# Patient Record
Sex: Male | Born: 1935 | Race: White | Hispanic: No | Marital: Married | Smoking: Former smoker
Health system: Southern US, Community
[De-identification: ages and names within clinical notes are randomized; demographics above are authoritative.]

## PROBLEM LIST (undated history)

## (undated) DIAGNOSIS — E785 Hyperlipidemia, unspecified: Secondary | ICD-10-CM

## (undated) DIAGNOSIS — I219 Acute myocardial infarction, unspecified: Secondary | ICD-10-CM

## (undated) DIAGNOSIS — E119 Type 2 diabetes mellitus without complications: Secondary | ICD-10-CM

## (undated) DIAGNOSIS — N289 Disorder of kidney and ureter, unspecified: Secondary | ICD-10-CM

## (undated) DIAGNOSIS — Z5189 Encounter for other specified aftercare: Secondary | ICD-10-CM

## (undated) DIAGNOSIS — I469 Cardiac arrest, cause unspecified: Secondary | ICD-10-CM

## (undated) DIAGNOSIS — I10 Essential (primary) hypertension: Secondary | ICD-10-CM

## (undated) DIAGNOSIS — I509 Heart failure, unspecified: Secondary | ICD-10-CM

## (undated) DIAGNOSIS — K219 Gastro-esophageal reflux disease without esophagitis: Secondary | ICD-10-CM

## (undated) DIAGNOSIS — I2510 Atherosclerotic heart disease of native coronary artery without angina pectoris: Secondary | ICD-10-CM

## (undated) DIAGNOSIS — E1121 Type 2 diabetes mellitus with diabetic nephropathy: Secondary | ICD-10-CM

## (undated) HISTORY — DX: Acute myocardial infarction, unspecified: I21.9

## (undated) HISTORY — DX: Hyperlipidemia, unspecified: E78.5

## (undated) HISTORY — DX: Heart failure, unspecified: I50.9

## (undated) HISTORY — DX: Type 2 diabetes mellitus with diabetic nephropathy: E11.21

## (undated) HISTORY — DX: Encounter for other specified aftercare: Z51.89

---

## 2013-01-29 ENCOUNTER — Emergency Department (HOSPITAL_COMMUNITY)
Admission: EM | Admit: 2013-01-29 | Discharge: 2013-01-29 | Disposition: A | Discharge status: Home / Self Care | Payer: Medicare Other | Attending: Emergency Medicine | Admitting: Emergency Medicine

## 2013-01-29 ENCOUNTER — Emergency Department (HOSPITAL_COMMUNITY): Payer: Medicare Other

## 2013-01-29 ENCOUNTER — Encounter (HOSPITAL_COMMUNITY): Payer: Self-pay | Admitting: Emergency Medicine

## 2013-01-29 DIAGNOSIS — K219 Gastro-esophageal reflux disease without esophagitis: Secondary | ICD-10-CM | POA: Insufficient documentation

## 2013-01-29 DIAGNOSIS — Z87448 Personal history of other diseases of urinary system: Secondary | ICD-10-CM | POA: Insufficient documentation

## 2013-01-29 DIAGNOSIS — S46909A Unspecified injury of unspecified muscle, fascia and tendon at shoulder and upper arm level, unspecified arm, initial encounter: Secondary | ICD-10-CM | POA: Insufficient documentation

## 2013-01-29 DIAGNOSIS — S4980XA Other specified injuries of shoulder and upper arm, unspecified arm, initial encounter: Secondary | ICD-10-CM | POA: Insufficient documentation

## 2013-01-29 DIAGNOSIS — R109 Unspecified abdominal pain: Secondary | ICD-10-CM

## 2013-01-29 DIAGNOSIS — E119 Type 2 diabetes mellitus without complications: Secondary | ICD-10-CM | POA: Insufficient documentation

## 2013-01-29 DIAGNOSIS — N23 Unspecified renal colic: Secondary | ICD-10-CM | POA: Insufficient documentation

## 2013-01-29 DIAGNOSIS — Y939 Activity, unspecified: Secondary | ICD-10-CM | POA: Insufficient documentation

## 2013-01-29 DIAGNOSIS — Y929 Unspecified place or not applicable: Secondary | ICD-10-CM | POA: Insufficient documentation

## 2013-01-29 DIAGNOSIS — Z79899 Other long term (current) drug therapy: Secondary | ICD-10-CM | POA: Insufficient documentation

## 2013-01-29 DIAGNOSIS — R112 Nausea with vomiting, unspecified: Secondary | ICD-10-CM | POA: Insufficient documentation

## 2013-01-29 DIAGNOSIS — S0993XA Unspecified injury of face, initial encounter: Secondary | ICD-10-CM | POA: Insufficient documentation

## 2013-01-29 DIAGNOSIS — R296 Repeated falls: Secondary | ICD-10-CM | POA: Insufficient documentation

## 2013-01-29 DIAGNOSIS — I10 Essential (primary) hypertension: Secondary | ICD-10-CM | POA: Insufficient documentation

## 2013-01-29 DIAGNOSIS — S3981XA Other specified injuries of abdomen, initial encounter: Secondary | ICD-10-CM | POA: Insufficient documentation

## 2013-01-29 DIAGNOSIS — Z7982 Long term (current) use of aspirin: Secondary | ICD-10-CM | POA: Insufficient documentation

## 2013-01-29 HISTORY — DX: Type 2 diabetes mellitus without complications: E11.9

## 2013-01-29 HISTORY — DX: Essential (primary) hypertension: I10

## 2013-01-29 HISTORY — DX: Disorder of kidney and ureter, unspecified: N28.9

## 2013-01-29 HISTORY — DX: Gastro-esophageal reflux disease without esophagitis: K21.9

## 2013-01-29 LAB — CBC WITH DIFFERENTIAL/PLATELET
Basophils Absolute: 0 10*3/uL (ref 0.0–0.1)
Basophils Relative: 0 % (ref 0–1)
Eosinophils Absolute: 0.1 10*3/uL (ref 0.0–0.7)
Eosinophils Relative: 1 % (ref 0–5)
HCT: 43.5 % (ref 39.0–52.0)
Hemoglobin: 14.8 g/dL (ref 13.0–17.0)
Lymphocytes Relative: 10 % — ABNORMAL LOW (ref 12–46)
Lymphs Abs: 1.2 10*3/uL (ref 0.7–4.0)
MCH: 31.8 pg (ref 26.0–34.0)
MCHC: 34 g/dL (ref 30.0–36.0)
MCV: 93.5 fL (ref 78.0–100.0)
Monocytes Absolute: 1.2 10*3/uL — ABNORMAL HIGH (ref 0.1–1.0)
Monocytes Relative: 10 % (ref 3–12)
Neutro Abs: 9.8 10*3/uL — ABNORMAL HIGH (ref 1.7–7.7)
Neutrophils Relative %: 79 % — ABNORMAL HIGH (ref 43–77)
Platelets: 255 10*3/uL (ref 150–400)
RBC: 4.65 MIL/uL (ref 4.22–5.81)
RDW: 13.5 % (ref 11.5–15.5)
WBC: 12.3 10*3/uL — ABNORMAL HIGH (ref 4.0–10.5)

## 2013-01-29 LAB — URINE MICROSCOPIC-ADD ON

## 2013-01-29 LAB — BASIC METABOLIC PANEL
BUN: 22 mg/dL (ref 6–23)
CO2: 23 mEq/L (ref 19–32)
Calcium: 9.8 mg/dL (ref 8.4–10.5)
Chloride: 98 mEq/L (ref 96–112)
Creatinine, Ser: 1.68 mg/dL — ABNORMAL HIGH (ref 0.50–1.35)
GFR calc Af Amer: 44 mL/min — ABNORMAL LOW (ref >90)
GFR calc non Af Amer: 38 mL/min — ABNORMAL LOW (ref >90)
Glucose, Bld: 146 mg/dL — ABNORMAL HIGH (ref 70–99)
Potassium: 3.8 mEq/L (ref 3.5–5.1)
Sodium: 135 mEq/L (ref 135–145)

## 2013-01-29 LAB — URINALYSIS, ROUTINE W REFLEX MICROSCOPIC
Bilirubin Urine: NEGATIVE
Glucose, UA: 100 mg/dL — AB
Ketones, ur: NEGATIVE mg/dL
Leukocytes, UA: NEGATIVE
Nitrite: NEGATIVE
Protein, ur: 100 mg/dL — AB
Specific Gravity, Urine: >1.03 — ABNORMAL HIGH (ref 1.005–1.030)
Urobilinogen, UA: 0.2 mg/dL (ref 0.0–1.0)
pH: 5 (ref 5.0–8.0)

## 2013-01-29 MED ORDER — MORPHINE SULFATE 4 MG/ML IJ SOLN
6.0000 mg | Freq: Once | INTRAMUSCULAR | Status: AC
  Administered 2013-01-29: 6 mg via INTRAMUSCULAR
  Filled 2013-01-29: qty 2

## 2013-01-29 MED ORDER — ONDANSETRON 4 MG PO TBDP
4.0000 mg | ORAL_TABLET | Freq: Once | ORAL | Status: AC
  Administered 2013-01-29: 4 mg via ORAL
  Filled 2013-01-29: qty 1

## 2013-01-29 MED ORDER — OXYCODONE-ACETAMINOPHEN 5-325 MG PO TABS: 1.0000 | ORAL_TABLET | ORAL | Status: DC | PRN

## 2013-01-29 MED ORDER — ONDANSETRON HCL 4 MG PO TABS: 4.0000 mg | ORAL_TABLET | Freq: Four times a day (QID) | ORAL | Status: DC

## 2013-01-29 NOTE — ED Notes (Signed)
Pt reports falling "flat on my back" on ice Friday.  Caught self w/ left arm and now having left lower quad ab. Pain, vomited x1 today. No diarrhea. No fever. Took azo today for previous kidney stone. Urine was dark yesterday, clearer today.

## 2013-01-29 NOTE — ED Notes (Signed)
Pt alert & oriented x4, stable gait. Patient given discharge instructions, paperwork & prescription(s). Patient  instructed to stop at the registration desk to finish any additional paperwork. Patient verbalized understanding. Pt left department w/ no further questions. 

## 2013-02-05 NOTE — ED Provider Notes (Signed)
CSN: [id number]     Arrival date & time 01/29/13  1633 History   First MD Initiated Contact with Patient 01/29/13 1943     Chief Complaint  Patient presents with  . Fall   (Consider location/radiation/quality/duration/timing/severity/associated sxs/prior Treatment) HPI  78 year old male with left flank/left back pain. Onset yesterday. Pain waxes and wanes doesn't completely go away. Nausea and vomiting x1. NBNB . Urine has been dark, otherwise no urinary complaints. No fevers or chills. No diarrhea. Also reporting a fall onto his back on Friday. With pain in L neck and L shoulder. Pain in L flank/abdomen began after this though.   Past Medical History  Diagnosis Date  . Diabetes mellitus without complication   . Hypertension   . Renal disorder   . Acid reflux    History reviewed. No pertinent past surgical history. No family history on file. History  Substance Use Topics  . Smoking status: Never Smoker   . Smokeless tobacco: Not on file  . Alcohol Use: No    Review of Systems  All systems reviewed and negative, other than as noted in HPI.   Allergies  Review of patient's allergies indicates no known allergies.  Home Medications   Current Outpatient Rx  Name  Route  Sig  Dispense  Refill  . aspirin EC 81 MG tablet   Oral   Take 81 mg by mouth daily.         Marland Kitchen. atorvastatin (LIPITOR) 80 MG tablet   Oral   Take 80 mg by mouth at bedtime.          . carvedilol (COREG) 6.25 MG tablet   Oral   Take 6.25 mg by mouth 2 (two) times daily.         . finasteride (PROSCAR) 5 MG tablet   Oral   Take 5 mg by mouth at bedtime.          Marland Kitchen. glimepiride (AMARYL) 4 MG tablet   Oral   Take 4 mg by mouth daily.         Marland Kitchen. losartan-hydrochlorothiazide (HYZAAR) 100-25 MG per tablet   Oral   Take 1 tablet by mouth daily.         . metFORMIN (GLUCOPHAGE) 1000 MG tablet   Oral   Take 1,000 mg by mouth 2 (two) times daily.         . pantoprazole (PROTONIX) 40 MG  tablet   Oral   Take 40 mg by mouth daily.         . Phenazopyridine HCl (AZO TABS PO)   Oral   Take 1 tablet by mouth as needed (for pain).         . ondansetron (ZOFRAN) 4 MG tablet   Oral   Take 1 tablet (4 mg total) by mouth every 6 (six) hours.   12 tablet   0   . oxyCODONE-acetaminophen (PERCOCET/ROXICET) 5-325 MG per tablet   Oral   Take 1-2 tablets by mouth every 4 (four) hours as needed for severe pain.   20 tablet   0    BP 124/69  Pulse 70  Temp(Src) 97.9 F (36.6 C) (Oral)  Resp 20  Ht 5\' 9"  (1.753 m)  Wt 190 lb (86.183 kg)  BMI 28.05 kg/m2  SpO2 92% Physical Exam  Nursing note and vitals reviewed. Constitutional: He appears well-developed and well-nourished. No distress.  HENT:  Head: Normocephalic and atraumatic.  Eyes: Conjunctivae are normal. Right eye exhibits no discharge. Left eye  exhibits no discharge.  Neck: Neck supple.  Cardiovascular: Normal rate, regular rhythm and normal heart sounds.  Exam reveals no gallop and no friction rub.   No murmur heard. Pulmonary/Chest: Effort normal and breath sounds normal. No respiratory distress.  Abdominal: Soft. He exhibits no distension. There is no tenderness.  Musculoskeletal: He exhibits no edema and no tenderness.  Neurological: He is alert.  Skin: Skin is warm and dry.  Psychiatric: He has a normal mood and affect. His behavior is normal. Thought content normal.    ED Course  Procedures (including critical care time) Labs Review Labs Reviewed  URINALYSIS, ROUTINE W REFLEX MICROSCOPIC - Abnormal; Notable for the following:    Color, Urine AMBER (*)    APPearance CLOUDY (*)    Specific Gravity, Urine >1.030 (*)    Glucose, UA 100 (*)    Hgb urine dipstick LARGE (*)    Protein, ur 100 (*)    All other components within normal limits  CBC WITH DIFFERENTIAL - Abnormal; Notable for the following:    WBC 12.3 (*)    Neutrophils Relative % 79 (*)    Neutro Abs 9.8 (*)    Lymphocytes Relative  10 (*)    Monocytes Absolute 1.2 (*)    All other components within normal limits  BASIC METABOLIC PANEL - Abnormal; Notable for the following:    Glucose, Bld 146 (*)    Creatinine, Ser 1.68 (*)    GFR calc non Af Amer 38 (*)    GFR calc Af Amer 44 (*)    All other components within normal limits  URINE MICROSCOPIC-ADD ON   Imaging Review No results found.  Ct Abdomen Pelvis Wo Contrast  01/29/2013   CLINICAL DATA:  Larey Seat onto his back 3 days ago. Left lower quadrant abdominal pain. Prior history of urinary tract calculi.  EXAM: CT ABDOMEN AND PELVIS WITHOUT CONTRAST  TECHNIQUE: Multidetector CT imaging of the abdomen and pelvis was performed following the standard protocol without intravenous contrast.  COMPARISON:  None.  FINDINGS: Several small calculi in the distal left ureter at the UVJ. Calculi within the left side of the urinary bladder, likely in the intramural portion of the ureter. These accounts for mild left hydronephrosis. Nonobstructing very small (2 mm) calculus in a lower pole calix of the left kidney. No right urinary tract calculi. Numerous small cysts involving both kidneys, including a hyperdense cyst arising from the upper pole of the left kidney (Hounsfield units greater than 70), consistent with a proteinaceous or hemorrhagic cyst. Within the limits of the unenhanced technique, no definite solid masses arising from either kidney.  Normal unenhanced appearance of the liver, spleen, and adrenal glands. Approximate 1.6 cm cyst in the proximal body of the pancreas which is otherwise unremarkable. Normal adrenal glands. Gallbladder unremarkable by CT. No biliary ductal dilation. Mild to moderate aortoiliofemoral atherosclerosis without aneurysm. No significant lymphadenopathy.  Very small hiatal hernia. Stomach otherwise normal in appearance. Normal-appearing small bowel. Entire colon tortuous, redundant, and relatively decompressed. Scattered diverticula without evidence of acute  diverticulitis. Normal short appendix in the right upper pelvis. No ascites.  Urinary bladder unremarkable. Mild median lobe prostate gland enlargement. Normal seminal vesicles. Phleboliths in both sides of low pelvis. Bilateral inguinal hernias containing fat, right greater than left.  Bone window images demonstrate lower thoracic spondylosis, degenerative disc disease at L2-3 and L5-S1, facet degenerative changes L4-5 and L5-S1, ankylosis involving both sacroiliac joints, and severe degenerative changes in both hips with mild protrusio  deformities. Vague approximate 6 mm nodule in the visualized right middle lobe. Scarring in the right middle lobe and lingula. Mild bronchiectasis in the visualized lower lobes. Heart mildly enlarged.  IMPRESSION: 1. Several small calculi in the distal left ureter at the UVJ, including within the intramural portion of the distal left ureter at the UVJ. These account for mild left urinary tract obstruction. 2. Non obstructing approximate 2 mm calculus in a lower pole calix of the left kidney. No right urinary tract calculi. 3. Approximate 1.6 cm cyst in the proximal body of the pancreas, likely representing a benign intraductal mucinous lesion. 4. Very small hiatal hernia. 5. Scattered colonic diverticular without evidence of acute diverticulitis. 6. Mild prostate gland enlargement. 7. Bilateral inguinal hernias containing fat, right larger than left. 8. Vague 6 mm nodule in the visualized right middle lobe. Scarring in the right middle lobe and lingula. Mild bronchiectasis in the visualized lower lobes. 9. Osseous findings as detailed above. A non-emergent CT of the chest, with contrast if possible, may be helpful to exclude other nodules elsewhere.   Electronically Signed   By: Hulan Saas M.D.   On: 01/29/2013 21:29   EKG Interpretation   None       MDM   1. Left flank pain   2. Ureteral colic    77yM with L flank pain. He initially attributed to recent fall, but  symptoms as well as CT consistent with renal colic. Some renal impairment, but baseline unclear. Pain controlled. Afebrile. Plan expectant management/pain control at this time. Urology FU as needed. Return precautions discussed.     Raeford Razor, MD 02/05/13 [phone]

## 2016-03-18 DIAGNOSIS — E782 Mixed hyperlipidemia: Secondary | ICD-10-CM | POA: Diagnosis not present

## 2016-03-18 DIAGNOSIS — I10 Essential (primary) hypertension: Secondary | ICD-10-CM | POA: Diagnosis not present

## 2016-03-18 DIAGNOSIS — Z713 Dietary counseling and surveillance: Secondary | ICD-10-CM | POA: Diagnosis not present

## 2016-03-18 DIAGNOSIS — K219 Gastro-esophageal reflux disease without esophagitis: Secondary | ICD-10-CM | POA: Diagnosis not present

## 2016-03-18 DIAGNOSIS — Z7189 Other specified counseling: Secondary | ICD-10-CM | POA: Diagnosis not present

## 2016-03-18 DIAGNOSIS — N400 Enlarged prostate without lower urinary tract symptoms: Secondary | ICD-10-CM | POA: Diagnosis not present

## 2016-03-18 DIAGNOSIS — N183 Chronic kidney disease, stage 3 (moderate): Secondary | ICD-10-CM | POA: Diagnosis not present

## 2016-03-18 DIAGNOSIS — Z6828 Body mass index (BMI) 28.0-28.9, adult: Secondary | ICD-10-CM | POA: Diagnosis not present

## 2016-03-18 DIAGNOSIS — E119 Type 2 diabetes mellitus without complications: Secondary | ICD-10-CM | POA: Diagnosis not present

## 2016-03-24 DIAGNOSIS — N183 Chronic kidney disease, stage 3 (moderate): Secondary | ICD-10-CM | POA: Diagnosis not present

## 2016-03-24 DIAGNOSIS — R809 Proteinuria, unspecified: Secondary | ICD-10-CM | POA: Diagnosis not present

## 2016-03-24 DIAGNOSIS — I129 Hypertensive chronic kidney disease with stage 1 through stage 4 chronic kidney disease, or unspecified chronic kidney disease: Secondary | ICD-10-CM | POA: Diagnosis not present

## 2016-03-24 DIAGNOSIS — N2581 Secondary hyperparathyroidism of renal origin: Secondary | ICD-10-CM | POA: Diagnosis not present

## 2016-03-24 DIAGNOSIS — E1122 Type 2 diabetes mellitus with diabetic chronic kidney disease: Secondary | ICD-10-CM | POA: Diagnosis not present

## 2016-03-24 DIAGNOSIS — E559 Vitamin D deficiency, unspecified: Secondary | ICD-10-CM | POA: Diagnosis not present

## 2016-05-21 DIAGNOSIS — Z1211 Encounter for screening for malignant neoplasm of colon: Secondary | ICD-10-CM | POA: Diagnosis not present

## 2016-06-16 DIAGNOSIS — Z6828 Body mass index (BMI) 28.0-28.9, adult: Secondary | ICD-10-CM | POA: Diagnosis not present

## 2016-06-16 DIAGNOSIS — Z713 Dietary counseling and surveillance: Secondary | ICD-10-CM | POA: Diagnosis not present

## 2016-06-16 DIAGNOSIS — N183 Chronic kidney disease, stage 3 (moderate): Secondary | ICD-10-CM | POA: Diagnosis not present

## 2016-06-16 DIAGNOSIS — K219 Gastro-esophageal reflux disease without esophagitis: Secondary | ICD-10-CM | POA: Diagnosis not present

## 2016-06-16 DIAGNOSIS — N400 Enlarged prostate without lower urinary tract symptoms: Secondary | ICD-10-CM | POA: Diagnosis not present

## 2016-06-16 DIAGNOSIS — E119 Type 2 diabetes mellitus without complications: Secondary | ICD-10-CM | POA: Diagnosis not present

## 2016-06-16 DIAGNOSIS — I10 Essential (primary) hypertension: Secondary | ICD-10-CM | POA: Diagnosis not present

## 2016-06-16 DIAGNOSIS — Z7189 Other specified counseling: Secondary | ICD-10-CM | POA: Diagnosis not present

## 2016-06-16 DIAGNOSIS — E782 Mixed hyperlipidemia: Secondary | ICD-10-CM | POA: Diagnosis not present

## 2016-09-22 DIAGNOSIS — R809 Proteinuria, unspecified: Secondary | ICD-10-CM | POA: Diagnosis not present

## 2016-09-22 DIAGNOSIS — N183 Chronic kidney disease, stage 3 (moderate): Secondary | ICD-10-CM | POA: Diagnosis not present

## 2016-09-22 DIAGNOSIS — I129 Hypertensive chronic kidney disease with stage 1 through stage 4 chronic kidney disease, or unspecified chronic kidney disease: Secondary | ICD-10-CM | POA: Diagnosis not present

## 2016-09-23 DIAGNOSIS — I10 Essential (primary) hypertension: Secondary | ICD-10-CM | POA: Diagnosis not present

## 2016-09-23 DIAGNOSIS — Z7189 Other specified counseling: Secondary | ICD-10-CM | POA: Diagnosis not present

## 2016-09-23 DIAGNOSIS — E119 Type 2 diabetes mellitus without complications: Secondary | ICD-10-CM | POA: Diagnosis not present

## 2016-09-23 DIAGNOSIS — Z6828 Body mass index (BMI) 28.0-28.9, adult: Secondary | ICD-10-CM | POA: Diagnosis not present

## 2016-09-23 DIAGNOSIS — Z713 Dietary counseling and surveillance: Secondary | ICD-10-CM | POA: Diagnosis not present

## 2016-09-23 DIAGNOSIS — K219 Gastro-esophageal reflux disease without esophagitis: Secondary | ICD-10-CM | POA: Diagnosis not present

## 2016-09-23 DIAGNOSIS — E782 Mixed hyperlipidemia: Secondary | ICD-10-CM | POA: Diagnosis not present

## 2016-09-23 DIAGNOSIS — N400 Enlarged prostate without lower urinary tract symptoms: Secondary | ICD-10-CM | POA: Diagnosis not present

## 2016-09-23 DIAGNOSIS — N183 Chronic kidney disease, stage 3 (moderate): Secondary | ICD-10-CM | POA: Diagnosis not present

## 2016-10-24 DIAGNOSIS — H524 Presbyopia: Secondary | ICD-10-CM | POA: Diagnosis not present

## 2016-10-24 DIAGNOSIS — E119 Type 2 diabetes mellitus without complications: Secondary | ICD-10-CM | POA: Diagnosis not present

## 2016-10-31 DIAGNOSIS — Z23 Encounter for immunization: Secondary | ICD-10-CM | POA: Diagnosis not present

## 2016-12-17 DIAGNOSIS — I10 Essential (primary) hypertension: Secondary | ICD-10-CM | POA: Diagnosis not present

## 2016-12-17 DIAGNOSIS — N183 Chronic kidney disease, stage 3 (moderate): Secondary | ICD-10-CM | POA: Diagnosis not present

## 2016-12-17 DIAGNOSIS — Z7189 Other specified counseling: Secondary | ICD-10-CM | POA: Diagnosis not present

## 2016-12-17 DIAGNOSIS — Z6828 Body mass index (BMI) 28.0-28.9, adult: Secondary | ICD-10-CM | POA: Diagnosis not present

## 2016-12-17 DIAGNOSIS — N400 Enlarged prostate without lower urinary tract symptoms: Secondary | ICD-10-CM | POA: Diagnosis not present

## 2016-12-17 DIAGNOSIS — E119 Type 2 diabetes mellitus without complications: Secondary | ICD-10-CM | POA: Diagnosis not present

## 2016-12-17 DIAGNOSIS — E782 Mixed hyperlipidemia: Secondary | ICD-10-CM | POA: Diagnosis not present

## 2016-12-17 DIAGNOSIS — Z713 Dietary counseling and surveillance: Secondary | ICD-10-CM | POA: Diagnosis not present

## 2016-12-17 DIAGNOSIS — K219 Gastro-esophageal reflux disease without esophagitis: Secondary | ICD-10-CM | POA: Diagnosis not present

## 2016-12-20 DIAGNOSIS — I469 Cardiac arrest, cause unspecified: Secondary | ICD-10-CM | POA: Diagnosis not present

## 2016-12-21 DIAGNOSIS — I255 Ischemic cardiomyopathy: Secondary | ICD-10-CM | POA: Diagnosis not present

## 2016-12-21 DIAGNOSIS — N179 Acute kidney failure, unspecified: Secondary | ICD-10-CM | POA: Diagnosis not present

## 2016-12-21 DIAGNOSIS — I214 Non-ST elevation (NSTEMI) myocardial infarction: Secondary | ICD-10-CM | POA: Diagnosis not present

## 2016-12-21 DIAGNOSIS — I468 Cardiac arrest due to other underlying condition: Secondary | ICD-10-CM | POA: Diagnosis not present

## 2016-12-21 DIAGNOSIS — I4891 Unspecified atrial fibrillation: Secondary | ICD-10-CM | POA: Diagnosis not present

## 2016-12-21 DIAGNOSIS — I700 Atherosclerosis of aorta: Secondary | ICD-10-CM | POA: Diagnosis not present

## 2016-12-21 DIAGNOSIS — Z79899 Other long term (current) drug therapy: Secondary | ICD-10-CM | POA: Diagnosis not present

## 2016-12-21 DIAGNOSIS — I2584 Coronary atherosclerosis due to calcified coronary lesion: Secondary | ICD-10-CM | POA: Diagnosis present

## 2016-12-21 DIAGNOSIS — R601 Generalized edema: Secondary | ICD-10-CM | POA: Diagnosis not present

## 2016-12-21 DIAGNOSIS — E1159 Type 2 diabetes mellitus with other circulatory complications: Secondary | ICD-10-CM | POA: Diagnosis not present

## 2016-12-21 DIAGNOSIS — I959 Hypotension, unspecified: Secondary | ICD-10-CM | POA: Diagnosis present

## 2016-12-21 DIAGNOSIS — J9600 Acute respiratory failure, unspecified whether with hypoxia or hypercapnia: Secondary | ICD-10-CM | POA: Diagnosis not present

## 2016-12-21 DIAGNOSIS — M7981 Nontraumatic hematoma of soft tissue: Secondary | ICD-10-CM | POA: Diagnosis not present

## 2016-12-21 DIAGNOSIS — Y710 Diagnostic and monitoring cardiovascular devices associated with adverse incidents: Secondary | ICD-10-CM | POA: Diagnosis not present

## 2016-12-21 DIAGNOSIS — I130 Hypertensive heart and chronic kidney disease with heart failure and stage 1 through stage 4 chronic kidney disease, or unspecified chronic kidney disease: Secondary | ICD-10-CM | POA: Diagnosis present

## 2016-12-21 DIAGNOSIS — I219 Acute myocardial infarction, unspecified: Secondary | ICD-10-CM | POA: Diagnosis not present

## 2016-12-21 DIAGNOSIS — I5023 Acute on chronic systolic (congestive) heart failure: Secondary | ICD-10-CM | POA: Diagnosis present

## 2016-12-21 DIAGNOSIS — N183 Chronic kidney disease, stage 3 (moderate): Secondary | ICD-10-CM | POA: Diagnosis not present

## 2016-12-21 DIAGNOSIS — Z8674 Personal history of sudden cardiac arrest: Secondary | ICD-10-CM | POA: Diagnosis not present

## 2016-12-21 DIAGNOSIS — R001 Bradycardia, unspecified: Secondary | ICD-10-CM | POA: Diagnosis not present

## 2016-12-21 DIAGNOSIS — J9811 Atelectasis: Secondary | ICD-10-CM | POA: Diagnosis not present

## 2016-12-21 DIAGNOSIS — J9601 Acute respiratory failure with hypoxia: Secondary | ICD-10-CM | POA: Diagnosis present

## 2016-12-21 DIAGNOSIS — Z9911 Dependence on respirator [ventilator] status: Secondary | ICD-10-CM | POA: Diagnosis not present

## 2016-12-21 DIAGNOSIS — I351 Nonrheumatic aortic (valve) insufficiency: Secondary | ICD-10-CM | POA: Diagnosis present

## 2016-12-21 DIAGNOSIS — R9439 Abnormal result of other cardiovascular function study: Secondary | ICD-10-CM | POA: Diagnosis not present

## 2016-12-21 DIAGNOSIS — G9340 Encephalopathy, unspecified: Secondary | ICD-10-CM | POA: Diagnosis not present

## 2016-12-21 DIAGNOSIS — E785 Hyperlipidemia, unspecified: Secondary | ICD-10-CM | POA: Diagnosis present

## 2016-12-21 DIAGNOSIS — I4901 Ventricular fibrillation: Secondary | ICD-10-CM | POA: Diagnosis not present

## 2016-12-21 DIAGNOSIS — K661 Hemoperitoneum: Secondary | ICD-10-CM | POA: Diagnosis not present

## 2016-12-21 DIAGNOSIS — I447 Left bundle-branch block, unspecified: Secondary | ICD-10-CM | POA: Diagnosis present

## 2016-12-21 DIAGNOSIS — E1129 Type 2 diabetes mellitus with other diabetic kidney complication: Secondary | ICD-10-CM | POA: Diagnosis not present

## 2016-12-21 DIAGNOSIS — N200 Calculus of kidney: Secondary | ICD-10-CM | POA: Diagnosis not present

## 2016-12-21 DIAGNOSIS — R079 Chest pain, unspecified: Secondary | ICD-10-CM | POA: Diagnosis present

## 2016-12-21 DIAGNOSIS — R0689 Other abnormalities of breathing: Secondary | ICD-10-CM | POA: Diagnosis not present

## 2016-12-21 DIAGNOSIS — I25110 Atherosclerotic heart disease of native coronary artery with unstable angina pectoris: Secondary | ICD-10-CM | POA: Diagnosis not present

## 2016-12-21 DIAGNOSIS — R0602 Shortness of breath: Secondary | ICD-10-CM | POA: Diagnosis not present

## 2016-12-21 DIAGNOSIS — L7632 Postprocedural hematoma of skin and subcutaneous tissue following other procedure: Secondary | ICD-10-CM | POA: Diagnosis not present

## 2016-12-21 DIAGNOSIS — K4020 Bilateral inguinal hernia, without obstruction or gangrene, not specified as recurrent: Secondary | ICD-10-CM | POA: Diagnosis not present

## 2016-12-21 DIAGNOSIS — J9602 Acute respiratory failure with hypercapnia: Secondary | ICD-10-CM | POA: Diagnosis not present

## 2016-12-21 DIAGNOSIS — I2510 Atherosclerotic heart disease of native coronary artery without angina pectoris: Secondary | ICD-10-CM | POA: Diagnosis not present

## 2016-12-21 DIAGNOSIS — I25119 Atherosclerotic heart disease of native coronary artery with unspecified angina pectoris: Secondary | ICD-10-CM | POA: Diagnosis not present

## 2016-12-21 DIAGNOSIS — I469 Cardiac arrest, cause unspecified: Secondary | ICD-10-CM | POA: Diagnosis not present

## 2016-12-21 DIAGNOSIS — R4182 Altered mental status, unspecified: Secondary | ICD-10-CM | POA: Diagnosis not present

## 2016-12-21 DIAGNOSIS — D62 Acute posthemorrhagic anemia: Secondary | ICD-10-CM | POA: Diagnosis not present

## 2016-12-21 DIAGNOSIS — J9690 Respiratory failure, unspecified, unspecified whether with hypoxia or hypercapnia: Secondary | ICD-10-CM | POA: Diagnosis not present

## 2016-12-21 DIAGNOSIS — I10 Essential (primary) hypertension: Secondary | ICD-10-CM | POA: Diagnosis not present

## 2016-12-21 DIAGNOSIS — E119 Type 2 diabetes mellitus without complications: Secondary | ICD-10-CM | POA: Diagnosis not present

## 2016-12-21 DIAGNOSIS — R579 Shock, unspecified: Secondary | ICD-10-CM | POA: Diagnosis not present

## 2016-12-21 DIAGNOSIS — Z4682 Encounter for fitting and adjustment of non-vascular catheter: Secondary | ICD-10-CM | POA: Diagnosis not present

## 2016-12-21 DIAGNOSIS — Y838 Other surgical procedures as the cause of abnormal reaction of the patient, or of later complication, without mention of misadventure at the time of the procedure: Secondary | ICD-10-CM | POA: Diagnosis not present

## 2016-12-21 DIAGNOSIS — E872 Acidosis: Secondary | ICD-10-CM | POA: Diagnosis not present

## 2016-12-21 DIAGNOSIS — J90 Pleural effusion, not elsewhere classified: Secondary | ICD-10-CM | POA: Diagnosis not present

## 2016-12-21 DIAGNOSIS — I240 Acute coronary thrombosis not resulting in myocardial infarction: Secondary | ICD-10-CM | POA: Diagnosis not present

## 2016-12-21 DIAGNOSIS — R55 Syncope and collapse: Secondary | ICD-10-CM | POA: Diagnosis not present

## 2016-12-21 DIAGNOSIS — Z7982 Long term (current) use of aspirin: Secondary | ICD-10-CM | POA: Diagnosis not present

## 2016-12-21 DIAGNOSIS — Z87891 Personal history of nicotine dependence: Secondary | ICD-10-CM | POA: Diagnosis not present

## 2016-12-21 DIAGNOSIS — E1122 Type 2 diabetes mellitus with diabetic chronic kidney disease: Secondary | ICD-10-CM | POA: Diagnosis present

## 2016-12-21 DIAGNOSIS — R7989 Other specified abnormal findings of blood chemistry: Secondary | ICD-10-CM | POA: Diagnosis not present

## 2016-12-21 DIAGNOSIS — K219 Gastro-esophageal reflux disease without esophagitis: Secondary | ICD-10-CM | POA: Diagnosis present

## 2016-12-21 DIAGNOSIS — Z794 Long term (current) use of insulin: Secondary | ICD-10-CM | POA: Diagnosis not present

## 2016-12-21 DIAGNOSIS — I462 Cardiac arrest due to underlying cardiac condition: Secondary | ICD-10-CM | POA: Diagnosis not present

## 2016-12-21 DIAGNOSIS — I5022 Chronic systolic (congestive) heart failure: Secondary | ICD-10-CM | POA: Diagnosis not present

## 2016-12-21 DIAGNOSIS — R748 Abnormal levels of other serum enzymes: Secondary | ICD-10-CM | POA: Diagnosis not present

## 2016-12-21 DIAGNOSIS — G931 Anoxic brain damage, not elsewhere classified: Secondary | ICD-10-CM | POA: Diagnosis not present

## 2016-12-30 ENCOUNTER — Inpatient Hospital Stay (HOSPITAL_COMMUNITY)
Admission: EM | Admit: 2016-12-30 | Discharge: 2017-01-04 | DRG: 226 | Disposition: A | Discharge status: Home Health | Payer: Medicare Other | Attending: Cardiology | Admitting: Cardiology

## 2016-12-30 ENCOUNTER — Encounter (HOSPITAL_COMMUNITY): Payer: Self-pay | Admitting: Emergency Medicine

## 2016-12-30 ENCOUNTER — Other Ambulatory Visit: Payer: Self-pay

## 2016-12-30 DIAGNOSIS — I214 Non-ST elevation (NSTEMI) myocardial infarction: Principal | ICD-10-CM | POA: Diagnosis present

## 2016-12-30 DIAGNOSIS — E1159 Type 2 diabetes mellitus with other circulatory complications: Secondary | ICD-10-CM

## 2016-12-30 DIAGNOSIS — Z7982 Long term (current) use of aspirin: Secondary | ICD-10-CM

## 2016-12-30 DIAGNOSIS — I469 Cardiac arrest, cause unspecified: Secondary | ICD-10-CM

## 2016-12-30 DIAGNOSIS — I2510 Atherosclerotic heart disease of native coronary artery without angina pectoris: Secondary | ICD-10-CM | POA: Diagnosis not present

## 2016-12-30 DIAGNOSIS — J9811 Atelectasis: Secondary | ICD-10-CM | POA: Diagnosis not present

## 2016-12-30 DIAGNOSIS — I2584 Coronary atherosclerosis due to calcified coronary lesion: Secondary | ICD-10-CM | POA: Diagnosis not present

## 2016-12-30 DIAGNOSIS — Z8674 Personal history of sudden cardiac arrest: Secondary | ICD-10-CM

## 2016-12-30 DIAGNOSIS — E785 Hyperlipidemia, unspecified: Secondary | ICD-10-CM

## 2016-12-30 DIAGNOSIS — I351 Nonrheumatic aortic (valve) insufficiency: Secondary | ICD-10-CM | POA: Diagnosis present

## 2016-12-30 DIAGNOSIS — I219 Acute myocardial infarction, unspecified: Secondary | ICD-10-CM | POA: Diagnosis not present

## 2016-12-30 DIAGNOSIS — I5023 Acute on chronic systolic (congestive) heart failure: Secondary | ICD-10-CM | POA: Diagnosis not present

## 2016-12-30 DIAGNOSIS — I255 Ischemic cardiomyopathy: Secondary | ICD-10-CM | POA: Diagnosis not present

## 2016-12-30 DIAGNOSIS — Z79899 Other long term (current) drug therapy: Secondary | ICD-10-CM

## 2016-12-30 DIAGNOSIS — I5022 Chronic systolic (congestive) heart failure: Secondary | ICD-10-CM | POA: Diagnosis not present

## 2016-12-30 DIAGNOSIS — I959 Hypotension, unspecified: Secondary | ICD-10-CM | POA: Diagnosis not present

## 2016-12-30 DIAGNOSIS — K219 Gastro-esophageal reflux disease without esophagitis: Secondary | ICD-10-CM | POA: Diagnosis present

## 2016-12-30 DIAGNOSIS — E1169 Type 2 diabetes mellitus with other specified complication: Secondary | ICD-10-CM

## 2016-12-30 DIAGNOSIS — I130 Hypertensive heart and chronic kidney disease with heart failure and stage 1 through stage 4 chronic kidney disease, or unspecified chronic kidney disease: Secondary | ICD-10-CM | POA: Diagnosis present

## 2016-12-30 DIAGNOSIS — Z794 Long term (current) use of insulin: Secondary | ICD-10-CM | POA: Diagnosis not present

## 2016-12-30 DIAGNOSIS — R079 Chest pain, unspecified: Secondary | ICD-10-CM | POA: Diagnosis not present

## 2016-12-30 DIAGNOSIS — I252 Old myocardial infarction: Secondary | ICD-10-CM

## 2016-12-30 DIAGNOSIS — I25110 Atherosclerotic heart disease of native coronary artery with unstable angina pectoris: Secondary | ICD-10-CM | POA: Diagnosis present

## 2016-12-30 DIAGNOSIS — Z95818 Presence of other cardiac implants and grafts: Secondary | ICD-10-CM

## 2016-12-30 DIAGNOSIS — M25569 Pain in unspecified knee: Secondary | ICD-10-CM

## 2016-12-30 DIAGNOSIS — N183 Chronic kidney disease, stage 3 (moderate): Secondary | ICD-10-CM | POA: Diagnosis not present

## 2016-12-30 DIAGNOSIS — I447 Left bundle-branch block, unspecified: Secondary | ICD-10-CM | POA: Diagnosis present

## 2016-12-30 DIAGNOSIS — I10 Essential (primary) hypertension: Secondary | ICD-10-CM | POA: Diagnosis not present

## 2016-12-30 DIAGNOSIS — I200 Unstable angina: Secondary | ICD-10-CM | POA: Diagnosis present

## 2016-12-30 DIAGNOSIS — E1122 Type 2 diabetes mellitus with diabetic chronic kidney disease: Secondary | ICD-10-CM | POA: Diagnosis present

## 2016-12-30 DIAGNOSIS — S8002XA Contusion of left knee, initial encounter: Secondary | ICD-10-CM | POA: Diagnosis not present

## 2016-12-30 DIAGNOSIS — I4901 Ventricular fibrillation: Secondary | ICD-10-CM | POA: Diagnosis not present

## 2016-12-30 HISTORY — DX: Cardiac arrest, cause unspecified: I46.9

## 2016-12-30 HISTORY — DX: Atherosclerotic heart disease of native coronary artery without angina pectoris: I25.10

## 2016-12-30 LAB — BASIC METABOLIC PANEL
Anion gap: 8 (ref 5–15)
BUN: 24 mg/dL — ABNORMAL HIGH (ref 6–20)
CHLORIDE: 103 mmol/L (ref 101–111)
CO2: 23 mmol/L (ref 22–32)
Calcium: 9.3 mg/dL (ref 8.9–10.3)
Creatinine, Ser: 1.29 mg/dL — ABNORMAL HIGH (ref 0.61–1.24)
GFR calc Af Amer: 59 mL/min — ABNORMAL LOW (ref >60)
GFR calc non Af Amer: 51 mL/min — ABNORMAL LOW (ref >60)
GLUCOSE: 244 mg/dL — AB (ref 65–99)
POTASSIUM: 4.6 mmol/L (ref 3.5–5.1)
Sodium: 134 mmol/L — ABNORMAL LOW (ref 135–145)

## 2016-12-30 LAB — CBC
HEMATOCRIT: 38.9 % — AB (ref 39.0–52.0)
Hemoglobin: 12.7 g/dL — ABNORMAL LOW (ref 13.0–17.0)
MCH: 31.4 pg (ref 26.0–34.0)
MCHC: 32.6 g/dL (ref 30.0–36.0)
MCV: 96 fL (ref 78.0–100.0)
Platelets: 376 10*3/uL (ref 150–400)
RBC: 4.05 MIL/uL — ABNORMAL LOW (ref 4.22–5.81)
RDW: 16.4 % — ABNORMAL HIGH (ref 11.5–15.5)
WBC: 10 10*3/uL (ref 4.0–10.5)

## 2016-12-30 LAB — I-STAT TROPONIN, ED: Troponin i, poc: 0.04 ng/mL (ref 0.00–0.08)

## 2016-12-30 LAB — TROPONIN I: TROPONIN I: 0.05 ng/mL — AB (ref <0.03)

## 2016-12-30 LAB — GLUCOSE, CAPILLARY: Glucose-Capillary: 185 mg/dL — ABNORMAL HIGH (ref 65–99)

## 2016-12-30 MED ORDER — SODIUM CHLORIDE 0.9 % IV SOLN: 250.0000 mL | INTRAVENOUS | Status: DC | PRN

## 2016-12-30 MED ORDER — INSULIN ASPART 100 UNIT/ML ~~LOC~~ SOLN
0.0000 [IU] | Freq: Three times a day (TID) | SUBCUTANEOUS | Status: DC
  Administered 2016-12-31 (×2): 3 [IU] via SUBCUTANEOUS
  Administered 2016-12-31 – 2017-01-01 (×3): 2 [IU] via SUBCUTANEOUS
  Administered 2017-01-02 (×2): 3 [IU] via SUBCUTANEOUS
  Administered 2017-01-02: 5 [IU] via SUBCUTANEOUS
  Administered 2017-01-03: 2 [IU] via SUBCUTANEOUS
  Administered 2017-01-03 – 2017-01-04 (×3): 3 [IU] via SUBCUTANEOUS
  Administered 2017-01-04: 2 [IU] via SUBCUTANEOUS

## 2016-12-30 MED ORDER — HYDROCHLOROTHIAZIDE 25 MG PO TABS
25.0000 mg | ORAL_TABLET | Freq: Every day | ORAL | Status: DC
  Administered 2017-01-02: 25 mg via ORAL
  Filled 2016-12-30 (×2): qty 1

## 2016-12-30 MED ORDER — ATORVASTATIN CALCIUM 80 MG PO TABS
80.0000 mg | ORAL_TABLET | Freq: Every day | ORAL | Status: DC
  Administered 2016-12-30 – 2017-01-03 (×5): 80 mg via ORAL
  Filled 2016-12-30 (×5): qty 1

## 2016-12-30 MED ORDER — SODIUM CHLORIDE 0.9% FLUSH: 3.0000 mL | INTRAVENOUS | Status: DC | PRN

## 2016-12-30 MED ORDER — ACETAMINOPHEN 325 MG PO TABS
650.0000 mg | ORAL_TABLET | ORAL | Status: DC | PRN
  Administered 2016-12-31 – 2017-01-04 (×3): 650 mg via ORAL
  Filled 2016-12-30 (×3): qty 2

## 2016-12-30 MED ORDER — PANTOPRAZOLE SODIUM 40 MG PO TBEC
40.0000 mg | DELAYED_RELEASE_TABLET | Freq: Every day | ORAL | Status: DC
  Administered 2016-12-31 – 2017-01-04 (×5): 40 mg via ORAL
  Filled 2016-12-30 (×5): qty 1

## 2016-12-30 MED ORDER — CARVEDILOL 6.25 MG PO TABS
6.2500 mg | ORAL_TABLET | Freq: Two times a day (BID) | ORAL | Status: DC
  Administered 2016-12-30 – 2017-01-03 (×6): 6.25 mg via ORAL
  Filled 2016-12-30 (×9): qty 1

## 2016-12-30 MED ORDER — NITROGLYCERIN 0.4 MG SL SUBL: 0.4000 mg | SUBLINGUAL_TABLET | SUBLINGUAL | Status: DC | PRN

## 2016-12-30 MED ORDER — FINASTERIDE 5 MG PO TABS
5.0000 mg | ORAL_TABLET | Freq: Every day | ORAL | Status: DC
  Administered 2016-12-30 – 2017-01-03 (×5): 5 mg via ORAL
  Filled 2016-12-30 (×5): qty 1

## 2016-12-30 MED ORDER — ONDANSETRON HCL 4 MG/2ML IJ SOLN
4.0000 mg | Freq: Four times a day (QID) | INTRAMUSCULAR | Status: DC | PRN
Start: 2016-12-30 — End: 2017-01-04

## 2016-12-30 MED ORDER — HEPARIN (PORCINE) IN NACL 100-0.45 UNIT/ML-% IJ SOLN
1200.0000 [IU]/h | INTRAMUSCULAR | Status: DC
  Administered 2016-12-30: 1000 [IU]/h via INTRAVENOUS
  Administered 2016-12-31: 1200 [IU]/h via INTRAVENOUS
  Filled 2016-12-30 (×2): qty 250

## 2016-12-30 MED ORDER — NITROGLYCERIN IN D5W 200-5 MCG/ML-% IV SOLN
0.0000 ug/min | Freq: Once | INTRAVENOUS | Status: AC
Start: 2016-12-30 — End: 2016-12-30
  Administered 2016-12-30: 5 ug/min via INTRAVENOUS
  Filled 2016-12-30: qty 250

## 2016-12-30 MED ORDER — ASPIRIN EC 81 MG PO TBEC
81.0000 mg | DELAYED_RELEASE_TABLET | Freq: Every day | ORAL | Status: DC
  Administered 2016-12-30 – 2017-01-04 (×6): 81 mg via ORAL
  Filled 2016-12-30 (×6): qty 1

## 2016-12-30 MED ORDER — SODIUM CHLORIDE 0.9% FLUSH
3.0000 mL | Freq: Two times a day (BID) | INTRAVENOUS | Status: DC
  Administered 2016-12-30 – 2017-01-04 (×9): 3 mL via INTRAVENOUS

## 2016-12-30 MED ORDER — LOSARTAN POTASSIUM 50 MG PO TABS
100.0000 mg | ORAL_TABLET | Freq: Every day | ORAL | Status: DC
  Administered 2017-01-02: 100 mg via ORAL
  Filled 2016-12-30 (×2): qty 2

## 2016-12-30 MED ORDER — HEPARIN BOLUS VIA INFUSION
4000.0000 [IU] | Freq: Once | INTRAVENOUS | Status: AC
  Administered 2016-12-30: 4000 [IU] via INTRAVENOUS
  Filled 2016-12-30: qty 4000

## 2016-12-30 MED ORDER — LOSARTAN POTASSIUM-HCTZ 100-25 MG PO TABS: 1.0000 | ORAL_TABLET | Freq: Every day | ORAL | Status: DC

## 2016-12-30 MED ORDER — GLIMEPIRIDE 4 MG PO TABS
4.0000 mg | ORAL_TABLET | Freq: Every day | ORAL | Status: DC
  Administered 2016-12-31 – 2017-01-04 (×5): 4 mg via ORAL
  Filled 2016-12-30 (×5): qty 1

## 2016-12-30 NOTE — ED Notes (Signed)
Patient denies pain and is resting comfortably.  

## 2016-12-30 NOTE — ED Triage Notes (Signed)
Pt to ER sent from facility in [place] after v.fib cardiac arrest and MI. This happened Sunday night 11/18, over a week ago. Due to out of pocket cost, patient drove private vehicle here, 5.5 hour drive per patient with life vest on. Pt a/o x4. VSS. Patient appears pale.

## 2016-12-30 NOTE — ED Notes (Signed)
Per Dr Karenann Cai ok to remove life vest. Vest given to family.

## 2016-12-30 NOTE — Progress Notes (Signed)
CRITICAL VALUE ALERT  Critical Value:  Troponin 0.05  Date & Time Notied:  12/30/2016 @ 2344  Provider Notified: Provider not notified. Aware.   Orders Received/Actions taken: No new orders.

## 2016-12-30 NOTE — H&P (Signed)
Cardiology Consultation:   Patient ID: Omar Williams; [id number]; 1935/06/14   Admit date: 12/30/2016 Date of Consult: 12/30/2016  Primary Care Provider: Calla Kicks, MD Primary Cardiologist: No primary care provider on file. Dr. Purvis Sheffield Primary Electrophysiologist:     Patient Profile:   Omar Williams is a 81 y.o. male with a hx of HTN, DM who is being seen today for the evaluation of CAD at the request of Dr. Rubin Payor.  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  He was on vacation in [place] and suffered a cardiac arrest.  He required CPR and was intubated for a few days.  He was taken to [facility].  Ultimately, he underwent heart catheterization which showed severe multivessel coronary artery disease.  He recovered in the hospital.  CABG was recommended but the patient wanted to be close to home when he had his surgery.  He decided to come back to [place].  Apparently, the doctor in [place] spoke with Dr. Laneta Simmers here regarding transfer of the patient.  The patient was told that he could go to admitting here and there was a room ready.  He showed up at admitting not feeling well and no one had any knowledge of his transfer.  In addition, the patient came with a LifeVest in a personal vehicle.  The family states that they were told it would be over $10,000 to take him in an ambulance.  He has found the LifeVest uncomfortable.  The patient reports a 2 out of 10 chest soreness.  It is worse when he coughs.  He also has pain with swallowing.  He does not recall any symptoms prior to his cardiac arrest.  Overall prior to this episode, he has been quite healthy.  He reports no allergies.  Past Medical History:  Diagnosis Date  . Acid reflux   . Diabetes mellitus without complication (HCC)   . Hypertension   . Renal disorder     History reviewed. No pertinent surgical history.      Inpatient Medications: Scheduled Meds:  Continuous  Infusions:  PRN Meds:   Allergies:   No Known Allergies  Social History: The patient is married and is a Optician, dispensing. Social History   Socioeconomic History  . Marital status: Married    Spouse name: Not on file  . Number of children: Not on file  . Years of education: Not on file  . Highest education level: Not on file  Social Needs  . Financial resource strain: Not on file  . Food insecurity - worry: Not on file  . Food insecurity - inability: Not on file  . Transportation needs - medical: Not on file  . Transportation needs - non-medical: Not on file  Occupational History  . Not on file  Tobacco Use  . Smoking status: Never Smoker  . Smokeless tobacco: Never Used  Substance and Sexual Activity  . Alcohol use: No  . Drug use: No  . Sexual activity: No  Other Topics Concern  . Not on file  Social History Narrative  . Not on file    Family History:   History reviewed. No pertinent family history.  Hypertension  ROS:  Please see the history of present illness.  ROS  Chest soreness , right sided bruising post cath .  all other ROS reviewed and negative.     Physical Exam/Data:   Vitals:   12/30/16 1728 12/30/16 1745 12/30/16 1800  BP: 131/73 140/75 139/77  Pulse: 88 76 84  Resp: 18 (!) 21 (!) 25  SpO2: 95% 92% 96%   No intake or output data in the 24 hours ending 12/30/16 1813 There were no vitals filed for this visit. There is no height or weight on file to calculate BMI.  General:  Well nourished, well developed, in no acute distress HEENT: normal Lymph: no adenopathy Neck: no JVD Endocrine:  No thryomegaly Vascular: No carotid bruits; FA pulses 2+ bilaterally without bruits  Cardiac:  normal S1, S2; RRR; no murmur  Lungs:  clear to auscultation bilaterally, no wheezing, rhonchi or rales  Abd: soft, nontender, no hepatomegaly , obese Ext: no edema Musculoskeletal:  No deformities, BUE and BLE strength normal and equal , large area of bruising on his  right flank and side Skin: warm and dry  Neuro:  CNs 2-12 intact, no focal abnormalities noted Psych:  Normal affect   EKG:  The EKG was personally reviewed and demonstrates: Normal sinus rhythm, left bundle branch block pattern Telemetry:  Telemetry was personally reviewed and demonstrates: Normal sinus rhythm  Relevant CV Studies: I personally reviewed the cath films showing severe calcific diffuse disease of the LAD and circumflex.  Laboratory Data:  ChemistryNo results for input(s): NA, K, CL, CO2, GLUCOSE, BUN, CREATININE, CALCIUM, GFRNONAA, GFRAA, ANIONGAP in the last 168 hours.  No results for input(s): PROT, ALBUMIN, AST, ALT, ALKPHOS, BILITOT in the last 168 hours. Hematology Recent Labs  Lab 12/30/16 1738  WBC 10.0  RBC 4.05*  HGB 12.7*  HCT 38.9*  MCV 96.0  MCH 31.4  MCHC 32.6  RDW 16.4*  PLT 376   Cardiac EnzymesNo results for input(s): TROPONINI in the last 168 hours.  Recent Labs  Lab 12/30/16 1744  TROPIPOC 0.04    BNPNo results for input(s): BNP, PROBNP in the last 168 hours.  DDimer No results for input(s): DDIMER in the last 168 hours.  Radiology/Studies:  No results found.  Assessment and Plan:   1. Multivessel coronary artery disease: Status post cardiac arrest.  We do have the cath films which I reviewed.  We will contact cardiac surgery to evaluate for possible bypass. 2. Start statin.  Our records are limited.  Unsure of his home medicines at this time.  Started IV nitroglycerin given his diffuse coronary artery disease and stable initial blood pressure in the ER.  Will titrate this.  Watch the patient in the ICU.  Add beta-blocker depending on his vitals. 3. Social: The patient's family is now much happier that he will be settled in a room.  Understandably, they were upset when he showed up at admitting and did not have a room ready. 4. The patient came with a LifeVest.  I have instructed that they can take that off and return.  This was the plan  when they left [place], and returning it will save them cost wise as well.   For questions or updates, please contact [facility] Please consult [email or web address] for contact info under Cardiology/STEMI.   SignedLance Muss, Jayadeep Varanasi, MD  12/30/2016 6:13 PM

## 2016-12-30 NOTE — ED Provider Notes (Signed)
Seen primarily by Cardiology in the ED.   Benjiman Core, MD 12/30/16 [phone]

## 2016-12-30 NOTE — Progress Notes (Signed)
ANTICOAGULATION CONSULT NOTE - Initial Consult  Pharmacy Consult for heparin Indication: chest pain/ACS  No Known Allergies  Patient Measurements: Height: 5\' 9"  (175.3 cm) Weight: 183 lb 3.2 oz (83.1 kg) IBW/kg (Calculated) : 70.7 Heparin Dosing Weight: 83kg  Vital Signs: Temp: 99.8 F (37.7 C) (11/28 2159) BP: 117/63 (11/28 2045) Pulse Rate: 69 (11/28 2045)  Labs: Recent Labs    12/30/16 1738  HGB 12.7*  HCT 38.9*  PLT 376  CREATININE 1.29*    Estimated Creatinine Clearance: 46.4 mL/min (A) (by C-G formula based on SCr of 1.29 mg/dL (H)).   Medical History: Past Medical History:  Diagnosis Date  . Acid reflux   . Diabetes mellitus without complication (HCC)   . Hypertension   . Renal disorder    Assessment: 34 YOM with multivessel CAD and recent cardiac arrest and s/p cath while on vacation [place] of [place], admitted today for evaluation for CABG. Pharmacy is consulted to start IV heparin. Not on anticoagulation PTA  Goal of Therapy:  Heparin level 0.3-0.7 units/ml Monitor platelets by anticoagulation protocol: Yes   Plan:  - Heparin bolus 4000 units x 1 - Heparin infusion 1000 units/hr - f/u heparin level at 0500 - Daily heparin level and CBC - f/u plans for surgery.  Bayard Hugger, PharmD, BCPS  Clinical Pharmacist  Pager: [phone]   12/30/2016,10:17 PM

## 2016-12-31 ENCOUNTER — Inpatient Hospital Stay (HOSPITAL_COMMUNITY): Payer: Medicare Other

## 2016-12-31 DIAGNOSIS — I219 Acute myocardial infarction, unspecified: Secondary | ICD-10-CM

## 2016-12-31 DIAGNOSIS — I2510 Atherosclerotic heart disease of native coronary artery without angina pectoris: Secondary | ICD-10-CM

## 2016-12-31 DIAGNOSIS — R079 Chest pain, unspecified: Secondary | ICD-10-CM

## 2016-12-31 LAB — HEPARIN LEVEL (UNFRACTIONATED)
Heparin Unfractionated: 0.14 IU/mL — ABNORMAL LOW (ref 0.30–0.70)
Heparin Unfractionated: 0.47 IU/mL (ref 0.30–0.70)

## 2016-12-31 LAB — BASIC METABOLIC PANEL
ANION GAP: 8 (ref 5–15)
BUN: 21 mg/dL — AB (ref 6–20)
CALCIUM: 8.5 mg/dL — AB (ref 8.9–10.3)
CO2: 23 mmol/L (ref 22–32)
CREATININE: 1.32 mg/dL — AB (ref 0.61–1.24)
Chloride: 103 mmol/L (ref 101–111)
GFR calc Af Amer: 58 mL/min — ABNORMAL LOW (ref >60)
GFR, EST NON AFRICAN AMERICAN: 50 mL/min — AB (ref >60)
GLUCOSE: 209 mg/dL — AB (ref 65–99)
Potassium: 4.5 mmol/L (ref 3.5–5.1)
Sodium: 134 mmol/L — ABNORMAL LOW (ref 135–145)

## 2016-12-31 LAB — GLUCOSE, CAPILLARY
GLUCOSE-CAPILLARY: 144 mg/dL — AB (ref 65–99)
Glucose-Capillary: 184 mg/dL — ABNORMAL HIGH (ref 65–99)
Glucose-Capillary: 151 mg/dL — ABNORMAL HIGH (ref 65–99)
Glucose-Capillary: 145 mg/dL — ABNORMAL HIGH (ref 65–99)

## 2016-12-31 LAB — ECHOCARDIOGRAM COMPLETE
Height: 69 in
Weight: 2931.24 oz

## 2016-12-31 LAB — PROTIME-INR
INR: 1.11
Prothrombin Time: 14.2 seconds (ref 11.4–15.2)

## 2016-12-31 LAB — MRSA PCR SCREENING: MRSA by PCR: NEGATIVE

## 2016-12-31 LAB — LIPID PANEL
CHOL/HDL RATIO: 5.8 ratio
CHOLESTEROL: 185 mg/dL (ref 0–200)
HDL: 32 mg/dL — AB (ref >40)
LDL Cholesterol: 121 mg/dL — ABNORMAL HIGH (ref 0–99)
Triglycerides: 160 mg/dL — ABNORMAL HIGH (ref <150)
VLDL: 32 mg/dL (ref 0–40)

## 2016-12-31 LAB — TROPONIN I
TROPONIN I: 0.03 ng/mL — AB (ref <0.03)
TROPONIN I: 0.04 ng/mL — AB (ref <0.03)

## 2016-12-31 MED ORDER — NITROGLYCERIN IN D5W 200-5 MCG/ML-% IV SOLN: 0.0000 ug/min | Freq: Once | INTRAVENOUS | Status: DC

## 2016-12-31 MED ORDER — PERFLUTREN LIPID MICROSPHERE
1.0000 mL | INTRAVENOUS | Status: AC | PRN
  Administered 2016-12-31: 3 mL via INTRAVENOUS
  Filled 2016-12-31 (×2): qty 10

## 2016-12-31 MED ORDER — ORAL CARE MOUTH RINSE
15.0000 mL | Freq: Two times a day (BID) | OROMUCOSAL | Status: DC
  Administered 2016-12-31 – 2017-01-04 (×7): 15 mL via OROMUCOSAL

## 2016-12-31 NOTE — Progress Notes (Signed)
  Echocardiogram 2D Echocardiogram has been performed.  Omar Williams G Donelle Hise 12/31/2016, 3:57 PM

## 2016-12-31 NOTE — Progress Notes (Signed)
ANTICOAGULATION CONSULT NOTE - F/u Consult  Pharmacy Consult for heparin Indication: chest pain/ACS  No Known Allergies  Patient Measurements: Height: 5\' 9"  (175.3 cm) Weight: 183 lb 3.2 oz (83.1 kg) IBW/kg (Calculated) : 70.7 Heparin Dosing Weight: 83kg  Vital Signs: Temp: 98.7 F (37.1 C) (11/29 1208) Temp Source: Oral (11/29 1208) BP: 110/59 (11/29 1208) Pulse Rate: 62 (11/29 1208)  Labs: Recent Labs    12/30/16 1738 12/30/16 2229 12/31/16 0353 12/31/16 1018 12/31/16 1210  HGB 12.7*  --   --   --   --   HCT 38.9*  --   --   --   --   PLT 376  --   --   --   --   LABPROT  --   --  14.2  --   --   INR  --   --  1.11  --   --   HEPARINUNFRC  --   --  0.14*  --  0.47  CREATININE 1.29*  --  1.32*  --   --   TROPONINI  --  0.05* 0.03* 0.04*  --    Estimated Creatinine Clearance: 45.4 mL/min (A) (by C-G formula based on SCr of 1.32 mg/dL (H)).  Medical History: Past Medical History:  Diagnosis Date  . Acid reflux   . Diabetes mellitus without complication (HCC)   . Hypertension   . Renal disorder    Assessment: 47 YOM with multivessel CAD, recent cardiac arrest, and s/p cath while on vacation [place] of [place]. Admitted to [facility] for CABG evaluation. Pharmacy is consulted to dose heparin.   Heparin level at goal 0.4 and no infusion issues per RN. No new CBC from this AM, however, no bleeding reported per RN.   Goal of Therapy:  Heparin level 0.3-0.7 units/ml Monitor platelets by anticoagulation protocol: Yes   Plan:  Continue heparin gtt at 1200 units/hr Daily heparin level and CBC Monitor for s/s bleeding   Sheppard Coil PharmD., BCPS Clinical Pharmacist Pager [phone] 12/31/2016 2:22 PM

## 2016-12-31 NOTE — Progress Notes (Signed)
Progress Note  Patient Name: Omar Williams Date of Encounter: 12/31/2016  Primary Cardiologist: Dr. Purvis Sheffield  Subjective   Patient denies chest pain (does endorse some soreness from ACLS), shortness of breath, palpitations, dizziness. Still having weakness but is significantly improved from yesterday.  Inpatient Medications    Scheduled Meds: . aspirin EC  81 mg Oral Daily  . atorvastatin  80 mg Oral QHS  . carvedilol  6.25 mg Oral BID  . finasteride  5 mg Oral QHS  . glimepiride  4 mg Oral Daily  . losartan  100 mg Oral Daily   And  . hydrochlorothiazide  25 mg Oral Daily  . insulin aspart  0-15 Units Subcutaneous TID WC  . pantoprazole  40 mg Oral Daily  . sodium chloride flush  3 mL Intravenous Q12H   Continuous Infusions: . sodium chloride    . heparin 1,200 Units/hr (12/31/16 0556)   PRN Meds: sodium chloride, acetaminophen, nitroGLYCERIN, ondansetron (ZOFRAN) IV, sodium chloride flush   Vital Signs    Vitals:   12/31/16 0500 12/31/16 0600 12/31/16 0700 12/31/16 0739  BP: 104/66 131/65 108/67 108/67  Pulse: 63 63 60 62  Resp: 18 17 17 16   Temp:    99.1 F (37.3 C)  TempSrc:    Oral  SpO2: 95% 97% 95% 94%  Weight:      Height:        Intake/Output Summary (Last 24 hours) at 12/31/2016 0754 Last data filed at 12/31/2016 0700 Gross per 24 hour  Intake 98.31 ml  Output 800 ml  Net -701.69 ml   Filed Weights   12/30/16 2159 12/31/16 0400  Weight: 183 lb 3.2 oz (83.1 kg) 183 lb 3.2 oz (83.1 kg)    Telemetry    SR, PVCs - Personally Reviewed, 12/31/16  ECG    SR, 1st degree AV block, LBBB - Personally Reviewed 12/31/16  Physical Exam   GEN: No acute distress.   Neck: No JVD Cardiac: RRR, no murmurs, rubs, or gallops. Radial and PT pulses intact bilaterally. Respiratory: Clear to auscultation bilaterally. GI: Soft, nontender, non-distended  MS: No edema; No deformity. Neuro:  Nonfocal  Psych: Normal affect   Labs    Chemistry Recent  Labs  Lab 12/30/16 1738 12/31/16 0353  NA 134* 134*  K 4.6 4.5  CL 103 103  CO2 23 23  GLUCOSE 244* 209*  BUN 24* 21*  CREATININE 1.29* 1.32*  CALCIUM 9.3 8.5*  GFRNONAA 51* 50*  GFRAA 59* 58*  ANIONGAP 8 8     Hematology Recent Labs  Lab 12/30/16 1738  WBC 10.0  RBC 4.05*  HGB 12.7*  HCT 38.9*  MCV 96.0  MCH 31.4  MCHC 32.6  RDW 16.4*  PLT 376    Cardiac Enzymes Recent Labs  Lab 12/30/16 2229 12/31/16 0353  TROPONINI 0.05* 0.03*    Recent Labs  Lab 12/30/16 1744  TROPIPOC 0.04     BNPNo results for input(s): BNP, PROBNP in the last 168 hours.   DDimer No results for input(s): DDIMER in the last 168 hours.   Radiology    No results found.  Cardiac Studies   Cath films reviewed by Dr. Eldridge Dace show severe calcific, diffuse disease of LAD and circumflex arteries.  Patient Profile     81 y.o. male with pMH of HTN, DM who presented to ED soon after a cardiac arrest and hospitalization in [place]; patient had been recommended to get CABG due to severe, multivessel CAD noted in  cath in [place], however wanted to be closer to home for his surgery. Patient presented with life vest which was removed on admission.  Assessment & Plan    Multivessel CAD: S/p cardiac arrest <2 wks ago. Cath films brought by patient demonstrate multivessel CAD. Troponins have remained low (<0.05) since admission. Renal function and hgb stable.  --consult CT surgery for evaluation for CABG --f/u Echo --asa, atorvastatin 80mg  daily --coreg 6.25mg  BID --heparin gtt --nitro gtt - stopped last night  HLD: --atorvastatin 80mg  daily  HTN: Losartan and HCTZ held this AM due to soft BPs. --coreg 6.25mg  BID --losartan 100mg  daily --HCTZ 25mg  daily  T2DM: --glimepiride 4mg  daily, SSI-M  For questions or updates, please contact [facility] Please consult [email or web address] for contact info under Cardiology/STEMI.     Signed, Nyra MarketGorica Svalina, MD  12/31/2016, 7:54 AM     I  have examined the patient and reviewed assessment and plan and discussed with patient.  Agree with above as stated.  Patient feeling better.  Slept well.  Had long journey from [place].  Await input from Cardiac surgery regarding CABG.    Lance MussJayadeep Talita Recht

## 2016-12-31 NOTE — Progress Notes (Signed)
Inpatient Diabetes Program Recommendations  AACE/ADA: New Consensus Statement on Inpatient Glycemic Control (2015)  Target Ranges:  Prepandial:   less than 140 mg/dL      Peak postprandial:   less than 180 mg/dL (1-2 hours)      Critically ill patients:  140 - 180 mg/dL   Results for COYE, DERAAD (MRN [id number]) as of 12/31/2016 09:13  Ref. Range 12/30/2016 22:13 12/31/2016 07:41  Glucose-Capillary Latest Ref Range: 65 - 99 mg/dL 557 (H) 322 (H)   Review of Glycemic Control  Diabetes history: DM2 Outpatient Diabetes medications: Amaryl 4 mg BID, Lantus 12 units QHS Current orders for Inpatient glycemic control: Amaryl 4 mg daily, Novolog 0-15 units TID with meals  Inpatient Diabetes Program Recommendations: Correction (SSI): Please consider ordering Novolog 0-5 units QHS for bedtime correction. HgbA1C: Please consider ordering an A1C to evaluate glycemic control over the past 2-3 months.  Thanks, Orlando Penner, RN, MSN, CDE Diabetes Coordinator Inpatient Diabetes Program [phone] (Team Pager from 8am to 5pm)

## 2016-12-31 NOTE — Progress Notes (Signed)
ANTICOAGULATION CONSULT NOTE - F/u Consult  Pharmacy Consult for heparin Indication: chest pain/ACS  No Known Allergies  Patient Measurements: Height: 5\' 9"  (175.3 cm) Weight: 183 lb 3.2 oz (83.1 kg) IBW/kg (Calculated) : 70.7 Heparin Dosing Weight: 83kg  Vital Signs: Temp: 100.5 F (38.1 C) (11/29 0315) Temp Source: Oral (11/28 2339) BP: 109/60 (11/29 0400) Pulse Rate: 70 (11/29 0400)  Labs: Recent Labs    12/30/16 1738 12/30/16 2229 12/31/16 0353  HGB 12.7*  --   --   HCT 38.9*  --   --   PLT 376  --   --   LABPROT  --   --  14.2  INR  --   --  1.11  HEPARINUNFRC  --   --  0.14*  CREATININE 1.29*  --   --   TROPONINI  --  0.05*  --    Estimated Creatinine Clearance: 46.4 mL/min (A) (by C-G formula based on SCr of 1.29 mg/dL (H)).  Medical History: Past Medical History:  Diagnosis Date  . Acid reflux   . Diabetes mellitus without complication (HCC)   . Hypertension   . Renal disorder    Assessment: 60 YOM with multivessel CAD, recent cardiac arrest, and s/p cath while on vacation [place] of [place]. Admitted to [facility] for CABG evaluation. Pharmacy is consulted to dose heparin.   Heparin level subtherapeutic at 0.14 and no infusion issues per RN (drawn early at 5.5 hrs). No new CBC from this AM, however, no bleeding reported per RN.   Goal of Therapy:  Heparin level 0.3-0.7 units/ml Monitor platelets by anticoagulation protocol: Yes   Plan:  Increase heparin gtt to 1200 units/hr Heparin level in 8 hrs Daily heparin level and CBC Monitor for s/s bleeding   Einar Crow, PharmD Clinical Pharmacist 12/31/16 5:42 AM

## 2016-12-31 NOTE — Consult Note (Signed)
[address]       [place] [address]             [phone]      Cardiothoracic Surgery Consultation  Reason for Consult: Coronary artery disease s/p acute MI with out of hospital cardiac arrest Referring Physician: Dr. Verita Schneiders Omar Williams is an 81 y.o. male.  HPI:   The patient is a 81 year old gentleman from [place] with DM, hypertension, and chronic kidney disease who was visiting [place] and suffered a cardiac arrest in his motel room. He was found by his wife and had CPR by someone walking by. He was taken to a hospital in [place] and was intubated. He was transferred to [facility] and underwent cath showing diffuse coronary disease. He made a good recovery and was neurologically ok and up ambulating afterward. CABG was recommended but he wanted to return to [facility] for care since it was a financial hardship to have surgery there and have his family stay there. I was called by the cardiac surgeon who saw him to tell me about the patient and he said that he was getting the transfer arranged with cardiology here. Apparently the patient did not want to take an ambulance here due to the cost and was discharged with his family and drove back here and went to the admitting office. He says that he felt fine until the cardiac arrest with no prior chest pain or shortness of breath, good stamina. Since arriving here he has only had some chest soreness with cough which is likely due to the CPR.  Past Medical History:  Diagnosis Date  . Acid reflux   . Diabetes mellitus without complication ([place])   . Hypertension   . Renal disorder     History reviewed. No pertinent surgical history.  History reviewed. No pertinent family history.  Social History:  reports that  has never smoked. he has never used smokeless tobacco. He reports that he does not drink alcohol or use drugs.  Allergies: No Known Allergies  Medications:  I have reviewed the patient's current  medications. Prior to Admission:  Medications Prior to Admission  Medication Sig Dispense Refill Last Dose  . aspirin 325 MG tablet Take 325 mg by mouth at bedtime.   12/21/2016  . aspirin EC 81 MG tablet Take 81 mg by mouth daily.   12/21/2016  . atorvastatin (LIPITOR) 80 MG tablet Take 80 mg by mouth at bedtime.    12/21/2016  . carvedilol (COREG) 6.25 MG tablet Take 6.25 mg by mouth 2 (two) times daily.   12/21/2016 at 0500  . finasteride (PROSCAR) 5 MG tablet Take 5 mg by mouth at bedtime.    12/21/2016  . glimepiride (AMARYL) 4 MG tablet Take 4 mg by mouth 2 (two) times daily.    12/21/2016  . LANTUS SOLOSTAR 100 UNIT/ML Solostar Pen Inject 12 Units into the skin at bedtime.  1 12/20/2016  . losartan-hydrochlorothiazide (HYZAAR) 100-25 MG per tablet Take 1 tablet by mouth daily.   12/21/2016  . oxyCODONE-acetaminophen (PERCOCET/ROXICET) 5-325 MG per tablet Take 1-2 tablets by mouth every 4 (four) hours as needed for severe pain. 20 tablet 0 unknown at prn  . pantoprazole (PROTONIX) 40 MG tablet Take 40 mg by mouth daily.   12/21/2016   Scheduled: . aspirin EC  81 mg Oral Daily  . atorvastatin  80 mg Oral QHS  . carvedilol  6.25 mg Oral BID  . finasteride  5 mg Oral QHS  . glimepiride  4 mg Oral Daily  . losartan  100 mg Oral Daily   And  . hydrochlorothiazide  25 mg Oral Daily  . insulin aspart  0-15 Units Subcutaneous TID WC  . mouth rinse  15 mL Mouth Rinse BID  . pantoprazole  40 mg Oral Daily  . sodium chloride flush  3 mL Intravenous Q12H   Continuous: . sodium chloride    . heparin 1,200 Units/hr (12/31/16 0556)  . nitroGLYCERIN Stopped (12/31/16 1113)   LGX:QJJHER chloride, acetaminophen, nitroGLYCERIN, ondansetron (ZOFRAN) IV, sodium chloride flush Anti-infectives (From admission, onward)   None      Results for orders placed or performed during the hospital encounter of 12/30/16 (from the past 48 hour(s))  Basic metabolic panel     Status: Abnormal   Collection  Time: 12/30/16  5:38 PM  Result Value Ref Range   Sodium 134 (L) 135 - 145 mmol/L   Potassium 4.6 3.5 - 5.1 mmol/L   Chloride 103 101 - 111 mmol/L   CO2 23 22 - 32 mmol/L   Glucose, Bld 244 (H) 65 - 99 mg/dL   BUN 24 (H) 6 - 20 mg/dL   Creatinine, Ser 1.29 (H) 0.61 - 1.24 mg/dL   Calcium 9.3 8.9 - 10.3 mg/dL   GFR calc non Af Amer 51 (L) >60 mL/min   GFR calc Af Amer 59 (L) >60 mL/min    Comment: (NOTE) The eGFR has been calculated using the CKD EPI equation. This calculation has not been validated in all clinical situations. eGFR's persistently <60 mL/min signify possible Chronic Kidney Disease.    Anion gap 8 5 - 15  CBC     Status: Abnormal   Collection Time: 12/30/16  5:38 PM  Result Value Ref Range   WBC 10.0 4.0 - 10.5 K/uL   RBC 4.05 (L) 4.22 - 5.81 MIL/uL   Hemoglobin 12.7 (L) 13.0 - 17.0 g/dL   HCT 38.9 (L) 39.0 - 52.0 %   MCV 96.0 78.0 - 100.0 fL   MCH 31.4 26.0 - 34.0 pg   MCHC 32.6 30.0 - 36.0 g/dL   RDW 16.4 (H) 11.5 - 15.5 %   Platelets 376 150 - 400 K/uL  I-stat troponin, ED     Status: None   Collection Time: 12/30/16  5:44 PM  Result Value Ref Range   Troponin i, poc 0.04 0.00 - 0.08 ng/mL   Comment 3            Comment: Due to the release kinetics of cTnI, a negative result within the first hours of the onset of symptoms does not rule out myocardial infarction with certainty. If myocardial infarction is still suspected, repeat the test at appropriate intervals.   MRSA PCR Screening     Status: None   Collection Time: 12/30/16  9:50 PM  Result Value Ref Range   MRSA by PCR NEGATIVE NEGATIVE    Comment:        The GeneXpert MRSA Assay (FDA approved for NASAL specimens only), is one component of a comprehensive MRSA colonization surveillance program. It is not intended to diagnose MRSA infection nor to guide or monitor treatment for MRSA infections.   Glucose, capillary     Status: Abnormal   Collection Time: 12/30/16 10:13 PM  Result Value  Ref Range   Glucose-Capillary 185 (H) 65 - 99 mg/dL  Troponin I     Status: Abnormal   Collection Time: 12/30/16 10:29  PM  Result Value Ref Range   Troponin I 0.05 (HH) <0.03 ng/mL    Comment: CRITICAL RESULT CALLED TO, READ BACK BY AND VERIFIED WITH: MILLER,K RN 12/30/2016 2343 JORDANS   Troponin I     Status: Abnormal   Collection Time: 12/31/16  3:53 AM  Result Value Ref Range   Troponin I 0.03 (HH) <0.03 ng/mL    Comment: CRITICAL VALUE NOTED.  VALUE IS CONSISTENT WITH PREVIOUSLY REPORTED AND CALLED VALUE.  Basic metabolic panel     Status: Abnormal   Collection Time: 12/31/16  3:53 AM  Result Value Ref Range   Sodium 134 (L) 135 - 145 mmol/L   Potassium 4.5 3.5 - 5.1 mmol/L   Chloride 103 101 - 111 mmol/L   CO2 23 22 - 32 mmol/L   Glucose, Bld 209 (H) 65 - 99 mg/dL   BUN 21 (H) 6 - 20 mg/dL   Creatinine, Ser 1.32 (H) 0.61 - 1.24 mg/dL   Calcium 8.5 (L) 8.9 - 10.3 mg/dL   GFR calc non Af Amer 50 (L) >60 mL/min   GFR calc Af Amer 58 (L) >60 mL/min    Comment: (NOTE) The eGFR has been calculated using the CKD EPI equation. This calculation has not been validated in all clinical situations. eGFR's persistently <60 mL/min signify possible Chronic Kidney Disease.    Anion gap 8 5 - 15  Heparin level (unfractionated)     Status: Abnormal   Collection Time: 12/31/16  3:53 AM  Result Value Ref Range   Heparin Unfractionated 0.14 (L) 0.30 - 0.70 IU/mL    Comment:        IF HEPARIN RESULTS ARE BELOW EXPECTED VALUES, AND PATIENT DOSAGE HAS BEEN CONFIRMED, SUGGEST FOLLOW UP TESTING OF ANTITHROMBIN III LEVELS.   Protime-INR     Status: None   Collection Time: 12/31/16  3:53 AM  Result Value Ref Range   Prothrombin Time 14.2 11.4 - 15.2 seconds   INR 1.11   Lipid panel     Status: Abnormal   Collection Time: 12/31/16  3:53 AM  Result Value Ref Range   Cholesterol 185 0 - 200 mg/dL   Triglycerides 160 (H) <150 mg/dL   HDL 32 (L) >40 mg/dL   Total CHOL/HDL Ratio 5.8 RATIO     VLDL 32 0 - 40 mg/dL   LDL Cholesterol 121 (H) 0 - 99 mg/dL    Comment:        Total Cholesterol/HDL:CHD Risk Coronary Heart Disease Risk Table                     Men   Women  1/2 Average Risk   3.4   3.3  Average Risk       5.0   4.4  2 X Average Risk   9.6   7.1  3 X Average Risk  23.4   11.0        Use the calculated Patient Ratio above and the CHD Risk Table to determine the patient's CHD Risk.        ATP III CLASSIFICATION (LDL):  <100     mg/dL   Optimal  100-129  mg/dL   Near or Above                    Optimal  130-159  mg/dL   Borderline  160-189  mg/dL   High  >190     mg/dL   Very High   Glucose,  capillary     Status: Abnormal   Collection Time: 12/31/16  7:41 AM  Result Value Ref Range   Glucose-Capillary 184 (H) 65 - 99 mg/dL   Comment 1 Notify RN   Troponin I     Status: Abnormal   Collection Time: 12/31/16 10:18 AM  Result Value Ref Range   Troponin I 0.04 (HH) <0.03 ng/mL    Comment: CRITICAL VALUE NOTED.  VALUE IS CONSISTENT WITH PREVIOUSLY REPORTED AND CALLED VALUE.  Glucose, capillary     Status: Abnormal   Collection Time: 12/31/16 12:09 PM  Result Value Ref Range   Glucose-Capillary 151 (H) 65 - 99 mg/dL   Comment 1 Notify RN   Heparin level (unfractionated)     Status: None   Collection Time: 12/31/16 12:10 PM  Result Value Ref Range   Heparin Unfractionated 0.47 0.30 - 0.70 IU/mL    Comment:        IF HEPARIN RESULTS ARE BELOW EXPECTED VALUES, AND PATIENT DOSAGE HAS BEEN CONFIRMED, SUGGEST FOLLOW UP TESTING OF ANTITHROMBIN III LEVELS.     No results found.  Review of Systems  Constitutional: Negative.   HENT: Negative.   Eyes: Negative.   Respiratory: Negative for shortness of breath.   Cardiovascular: Positive for leg swelling. Negative for chest pain and orthopnea.  Gastrointestinal: Negative.   Genitourinary: Negative.   Musculoskeletal:       Chest wall discomfort with coughing  Skin: Negative.   Neurological: Negative.    Endo/Heme/Allergies: Negative.   Psychiatric/Behavioral: Negative.    Blood pressure (!) 110/59, pulse 62, temperature 98.7 F (37.1 C), temperature source Oral, resp. rate 13, height '5\' 9"'  (1.753 m), weight 83.1 kg (183 lb 3.2 oz), SpO2 96 %. Physical Exam  Constitutional: He is oriented to person, place, and time. He appears well-developed and well-nourished. No distress.  HENT:  Head: Normocephalic and atraumatic.  Mouth/Throat: Oropharynx is clear and moist.  Ecchymosis and swelling of lips  Eyes: EOM are normal. Pupils are equal, round, and reactive to light.  Neck: Normal range of motion. Neck supple. No JVD present. No thyromegaly present.  Cardiovascular: Normal rate, regular rhythm, normal heart sounds and intact distal pulses.  No murmur heard. Respiratory: Effort normal and breath sounds normal. No respiratory distress.  GI: Soft. Bowel sounds are normal. He exhibits no distension and no mass. There is no tenderness.  Musculoskeletal: Normal range of motion. He exhibits edema.  Lymphadenopathy:    He has no cervical adenopathy.  Neurological: He is alert and oriented to person, place, and time. He has normal strength. No cranial nerve deficit or sensory deficit.  Skin: Skin is warm and dry.  Psychiatric: He has a normal mood and affect.    Cardiac cath films from [facility] reviewed on disc. The LAD has a tight proximal stenosis and the remainder of the vessel is small caliber, diffusely diseased to the apex. The LCX is small and has mild proximal narrowing. The RCA is a large dominant vessel that is diffusely irregular but has no significant stenosis.  Assessment/Plan:  This 81 year old gentleman has severe single vessel CAD and suffered an acute MI with out of hospital cardiac arrest, successfully resuscitated, and has made a good recovery. His LAD is the culprit with a tight proximal stenosis but the vessel is small in caliber and diffusely diseased to the apex and not  graftable.The LCX and RCA do not have significant stenosis. I don't think CABG is an option for him since  the LAD is not graftable. I would consider PCI of the proximal LAD lesion or medical treatment given his advanced age. I discussed that with him and will discuss it with his family when they arrive.    I spent 60 minutes performing this consultation and > 50% of this time was spent face to face counseling and coordinating the care of this patient's coronary artery disease.  Omar Williams 12/31/2016, 1:43 PM

## 2017-01-01 ENCOUNTER — Encounter (HOSPITAL_COMMUNITY): Payer: Medicare Other

## 2017-01-01 ENCOUNTER — Encounter (HOSPITAL_COMMUNITY)
Admission: EM | Disposition: A | Discharge status: Home Health | Payer: Self-pay | Source: Home / Self Care | Attending: Interventional Cardiology

## 2017-01-01 ENCOUNTER — Encounter (HOSPITAL_COMMUNITY): Payer: Self-pay | Admitting: Nurse Practitioner

## 2017-01-01 DIAGNOSIS — I5022 Chronic systolic (congestive) heart failure: Secondary | ICD-10-CM

## 2017-01-01 DIAGNOSIS — I255 Ischemic cardiomyopathy: Secondary | ICD-10-CM

## 2017-01-01 HISTORY — PX: BIV ICD INSERTION CRT-D: EP1195

## 2017-01-01 LAB — CBC
HCT: 34.5 % — ABNORMAL LOW (ref 39.0–52.0)
Hemoglobin: 11 g/dL — ABNORMAL LOW (ref 13.0–17.0)
MCH: 30.9 pg (ref 26.0–34.0)
MCHC: 31.9 g/dL (ref 30.0–36.0)
MCV: 96.9 fL (ref 78.0–100.0)
PLATELETS: 329 10*3/uL (ref 150–400)
RBC: 3.56 MIL/uL — ABNORMAL LOW (ref 4.22–5.81)
RDW: 16.5 % — AB (ref 11.5–15.5)
WBC: 12.6 10*3/uL — ABNORMAL HIGH (ref 4.0–10.5)

## 2017-01-01 LAB — BASIC METABOLIC PANEL
Anion gap: 7 (ref 5–15)
BUN: 21 mg/dL — AB (ref 6–20)
CO2: 22 mmol/L (ref 22–32)
CREATININE: 1.35 mg/dL — AB (ref 0.61–1.24)
Calcium: 8.6 mg/dL — ABNORMAL LOW (ref 8.9–10.3)
Chloride: 104 mmol/L (ref 101–111)
GFR calc Af Amer: 56 mL/min — ABNORMAL LOW (ref >60)
GFR, EST NON AFRICAN AMERICAN: 48 mL/min — AB (ref >60)
Glucose, Bld: 128 mg/dL — ABNORMAL HIGH (ref 65–99)
Potassium: 4.2 mmol/L (ref 3.5–5.1)
SODIUM: 133 mmol/L — AB (ref 135–145)

## 2017-01-01 LAB — GLUCOSE, CAPILLARY
GLUCOSE-CAPILLARY: 140 mg/dL — AB (ref 65–99)
GLUCOSE-CAPILLARY: 149 mg/dL — AB (ref 65–99)
Glucose-Capillary: 124 mg/dL — ABNORMAL HIGH (ref 65–99)
Glucose-Capillary: 232 mg/dL — ABNORMAL HIGH (ref 65–99)

## 2017-01-01 LAB — HEPARIN LEVEL (UNFRACTIONATED): Heparin Unfractionated: 0.31 IU/mL (ref 0.30–0.70)

## 2017-01-01 LAB — SURGICAL PCR SCREEN
MRSA, PCR: NEGATIVE
Staphylococcus aureus: NEGATIVE

## 2017-01-01 SURGERY — BIV ICD INSERTION CRT-D
Anesthesia: LOCAL

## 2017-01-01 MED ORDER — FENTANYL CITRATE (PF) 100 MCG/2ML IJ SOLN
INTRAMUSCULAR | Status: DC | PRN
  Administered 2017-01-01: 25 ug via INTRAVENOUS
  Administered 2017-01-01: 12.5 ug via INTRAVENOUS

## 2017-01-01 MED ORDER — BUPIVACAINE HCL (PF) 0.25 % IJ SOLN: INTRAMUSCULAR | Status: DC | PRN

## 2017-01-01 MED ORDER — HEPARIN (PORCINE) IN NACL 2-0.9 UNIT/ML-% IJ SOLN
INTRAMUSCULAR | Status: AC
  Filled 2017-01-01: qty 500

## 2017-01-01 MED ORDER — CEFAZOLIN SODIUM-DEXTROSE 2-4 GM/100ML-% IV SOLN
2.0000 g | INTRAVENOUS | Status: AC
  Administered 2017-01-01: 2 g via INTRAVENOUS

## 2017-01-01 MED ORDER — CEFAZOLIN SODIUM-DEXTROSE 1-4 GM/50ML-% IV SOLN
1.0000 g | Freq: Three times a day (TID) | INTRAVENOUS | Status: AC
  Administered 2017-01-01 – 2017-01-02 (×3): 1 g via INTRAVENOUS
  Filled 2017-01-01 (×3): qty 50

## 2017-01-01 MED ORDER — CHLORHEXIDINE GLUCONATE 4 % EX LIQD
60.0000 mL | Freq: Once | CUTANEOUS | Status: AC
  Administered 2017-01-01: 4 via TOPICAL
  Filled 2017-01-01: qty 60

## 2017-01-01 MED ORDER — SODIUM CHLORIDE 0.9 % IR SOLN
80.0000 mg | Status: AC
  Administered 2017-01-01: 80 mg

## 2017-01-01 MED ORDER — IOPAMIDOL (ISOVUE-370) INJECTION 76%
INTRAVENOUS | Status: AC
  Filled 2017-01-01: qty 50

## 2017-01-01 MED ORDER — MIDAZOLAM HCL 5 MG/5ML IJ SOLN
INTRAMUSCULAR | Status: DC | PRN
  Administered 2017-01-01 (×2): 1 mg via INTRAVENOUS

## 2017-01-01 MED ORDER — LIDOCAINE HCL (PF) 1 % IJ SOLN
INTRAMUSCULAR | Status: DC | PRN
  Administered 2017-01-01: 60 mL

## 2017-01-01 MED ORDER — CEFAZOLIN SODIUM-DEXTROSE 1-4 GM/50ML-% IV SOLN
1.0000 g | Freq: Four times a day (QID) | INTRAVENOUS | Status: DC
  Filled 2017-01-01: qty 50

## 2017-01-01 MED ORDER — LIDOCAINE HCL (PF) 1 % IJ SOLN
INTRAMUSCULAR | Status: AC
  Filled 2017-01-01: qty 30

## 2017-01-01 MED ORDER — IOPAMIDOL (ISOVUE-370) INJECTION 76%
INTRAVENOUS | Status: DC | PRN
  Administered 2017-01-01: 50 mL via INTRAVENOUS

## 2017-01-01 MED ORDER — MIDAZOLAM HCL 5 MG/5ML IJ SOLN
INTRAMUSCULAR | Status: AC
  Filled 2017-01-01: qty 5

## 2017-01-01 MED ORDER — FENTANYL CITRATE (PF) 100 MCG/2ML IJ SOLN
INTRAMUSCULAR | Status: AC
  Filled 2017-01-01: qty 2

## 2017-01-01 MED ORDER — SODIUM CHLORIDE 0.9 % IR SOLN
Status: AC
  Filled 2017-01-01: qty 2

## 2017-01-01 MED ORDER — CEFAZOLIN SODIUM-DEXTROSE 2-4 GM/100ML-% IV SOLN
INTRAVENOUS | Status: AC
  Filled 2017-01-01: qty 100

## 2017-01-01 MED ORDER — HEPARIN (PORCINE) IN NACL 2-0.9 UNIT/ML-% IJ SOLN
INTRAMUSCULAR | Status: AC | PRN
  Administered 2017-01-01: 1000 mL

## 2017-01-01 MED ORDER — SODIUM CHLORIDE 0.9 % IV SOLN
INTRAVENOUS | Status: DC
  Administered 2017-01-01: 11:00:00 via INTRAVENOUS

## 2017-01-01 SURGICAL SUPPLY — 18 items
BALLN ATTAIN 80 (BALLOONS) ×2
BALLOON ATTAIN 80 (BALLOONS) ×1 IMPLANT
CABLE SURGICAL S-101-97-12 (CABLE) ×2 IMPLANT
CATH ATTAIN COM SURV 6250V-EH (CATHETERS) ×2 IMPLANT
CATH ATTAIN COM SURV 6250V-MB2 (CATHETERS) ×2 IMPLANT
CATH HEX JOSEPH 2-5-2 65CM 6F (CATHETERS) ×2 IMPLANT
ICD CLARIA MRI DTMA1QQ (ICD Generator) ×2 IMPLANT
LEAD ATTAIN PERFORMA S 4598-88 (Lead) ×2 IMPLANT
LEAD CAPSURE NOVUS 5076-52CM (Lead) ×2 IMPLANT
LEAD SPRINT QUAT SEC 6935M-62 (Lead) ×2 IMPLANT
PAD DEFIB LIFELINK (PAD) ×2 IMPLANT
SHEATH CLASSIC 7F (SHEATH) ×2 IMPLANT
SHEATH CLASSIC 9.5F (SHEATH) ×2 IMPLANT
SHEATH CLASSIC 9F (SHEATH) ×2 IMPLANT
SLITTER 6230UNI (MISCELLANEOUS) ×2 IMPLANT
TRAY PACEMAKER INSERTION (PACKS) ×2 IMPLANT
WIRE ACUITY WHISPER EDS 4648 (WIRE) ×2 IMPLANT
WIRE HI TORQ VERSACORE-J 145CM (WIRE) ×2 IMPLANT

## 2017-01-01 NOTE — Progress Notes (Signed)
ANTICOAGULATION CONSULT NOTE - F/u Consult  Pharmacy Consult for heparin Indication: chest pain/ACS  No Known Allergies  Patient Measurements: Height: 5\' 9"  (175.3 cm) Weight: 182 lb 15.7 oz (83 kg) IBW/kg (Calculated) : 70.7 Heparin Dosing Weight: 83kg  Vital Signs: Temp: 98.2 F (36.8 C) (11/30 0759) Temp Source: Oral (11/30 0759) BP: 122/60 (11/30 0900) Pulse Rate: 65 (11/30 0900)  Labs: Recent Labs    12/30/16 1738 12/30/16 2229 12/31/16 0353 12/31/16 1018 12/31/16 1210 01/01/17 0310  HGB 12.7*  --   --   --   --  11.0*  HCT 38.9*  --   --   --   --  34.5*  PLT 376  --   --   --   --  329  LABPROT  --   --  14.2  --   --   --   INR  --   --  1.11  --   --   --   HEPARINUNFRC  --   --  0.14*  --  0.47 0.31  CREATININE 1.29*  --  1.32*  --   --  1.35*  TROPONINI  --  0.05* 0.03* 0.04*  --   --    Estimated Creatinine Clearance: 44.4 mL/min (A) (by C-G formula based on SCr of 1.35 mg/dL (H)).  Medical History: Past Medical History:  Diagnosis Date  . Acid reflux   . Diabetes mellitus without complication (HCC)   . Hypertension   . Renal disorder    Assessment: 8 YOM with multivessel CAD, recent cardiac arrest, and s/p cath [place]. Pharmacy is consulted to dose heparin. Noted not a CABG candidate and plans for possible PCI -heparin level is at goal  Goal of Therapy:  Heparin level 0.3-0.7 units/ml Monitor platelets by anticoagulation protocol: Yes   Plan:  Continue heparin gtt at 1200 units/hr Daily heparin level and CBC  Harland German, Pharm D 01/01/2017 9:14 AM

## 2017-01-01 NOTE — Progress Notes (Signed)
Progress Note  Patient Name: Omar DoveJerry Williams Date of Encounter: 01/01/2017  Primary Cardiologist: newPurvis Sheffield- Koneswaran  Subjective   Still has some chest soreness.  Inpatient Medications    Scheduled Meds: . aspirin EC  81 mg Oral Daily  . atorvastatin  80 mg Oral QHS  . carvedilol  6.25 mg Oral BID  . finasteride  5 mg Oral QHS  . glimepiride  4 mg Oral Daily  . losartan  100 mg Oral Daily   And  . hydrochlorothiazide  25 mg Oral Daily  . insulin aspart  0-15 Units Subcutaneous TID WC  . mouth rinse  15 mL Mouth Rinse BID  . pantoprazole  40 mg Oral Daily  . sodium chloride flush  3 mL Intravenous Q12H   Continuous Infusions: . sodium chloride    . nitroGLYCERIN Stopped (12/31/16 1113)   PRN Meds: sodium chloride, acetaminophen, nitroGLYCERIN, ondansetron (ZOFRAN) IV, sodium chloride flush   Vital Signs    Vitals:   01/01/17 0723 01/01/17 0759 01/01/17 0800 01/01/17 0900  BP:   (!) 114/59 122/60  Pulse: (!) 55  (!) 56 65  Resp: 14  15 19   Temp:  98.2 F (36.8 C)    TempSrc:  Oral    SpO2: 95%  96% 96%  Weight:      Height:        Intake/Output Summary (Last 24 hours) at 01/01/2017 0925 Last data filed at 01/01/2017 0800 Gross per 24 hour  Intake 744 ml  Output 2125 ml  Net -1381 ml   Filed Weights   12/30/16 2159 12/31/16 0400 01/01/17 0300  Weight: 183 lb 3.2 oz (83.1 kg) 183 lb 3.2 oz (83.1 kg) 182 lb 15.7 oz (83 kg)    Telemetry    NSR - Personally Reviewed  ECG    Sinus bradycardia with LBBB - Personally Reviewed  Physical Exam   GEN: No acute distress.   Neck: No JVD Cardiac: RRR, no murmurs, rubs, or gallops.  Respiratory: Clear to auscultation bilaterally. GI: Soft, nontender, non-distended  MS: No edema; No deformity. Neuro:  Nonfocal  Psych: Normal affect   Labs    Chemistry Recent Labs  Lab 12/30/16 1738 12/31/16 0353 01/01/17 0310  NA 134* 134* 133*  K 4.6 4.5 4.2  CL 103 103 104  CO2 23 23 22   GLUCOSE 244* 209* 128*    BUN 24* 21* 21*  CREATININE 1.29* 1.32* 1.35*  CALCIUM 9.3 8.5* 8.6*  GFRNONAA 51* 50* 48*  GFRAA 59* 58* 56*  ANIONGAP 8 8 7      Hematology Recent Labs  Lab 12/30/16 1738 01/01/17 0310  WBC 10.0 12.6*  RBC 4.05* 3.56*  HGB 12.7* 11.0*  HCT 38.9* 34.5*  MCV 96.0 96.9  MCH 31.4 30.9  MCHC 32.6 31.9  RDW 16.4* 16.5*  PLT 376 329    Cardiac Enzymes Recent Labs  Lab 12/30/16 2229 12/31/16 0353 12/31/16 1018  TROPONINI 0.05* 0.03* 0.04*    Recent Labs  Lab 12/30/16 1744  TROPIPOC 0.04     BNPNo results for input(s): BNP, PROBNP in the last 168 hours.   DDimer No results for input(s): DDIMER in the last 168 hours.   Radiology    No results found.  Cardiac Studies   EF 15-20% by echo  Patient Profile     81 y.o. male with cardiac arrest, CAD  Assessment & Plan    1) CAD with severely calcified LAD.  Diffusely diseased.  No target for grafting  the vessel.  Even with PCI of the proximal vessel, the distal runoff would be poor.  I don't think PCI would give a lasting result.  He has not had angina at any point.  THerefore, plan for medical therapy of CAD with EP consult for AICD.  He is NPO now.  Discussed at length with the family.  THey are agreeable to AICD.  Soft BP has resulted in holding some of his heart failure meds.  Will check portable chest xray as he may have a fractured rib from CPR.  He will need an xray of the left knee as it is bruised from a fall he suffered with the cardiac arrest.   He will also need a PT eval to determine if he will need rehab of HHPT.  WOuld wait until AICD placed before doing too much strenuous effort.    For questions or updates, please contact [facility] Please consult [email or web address] for contact info under Cardiology/STEMI.      Signed, Lance Muss, MD  01/01/2017, 9:25 AM

## 2017-01-01 NOTE — Consult Note (Signed)
ELECTROPHYSIOLOGY CONSULT NOTE    Patient ID: Omar Williams MRN: [id number], DOB/AGE: Sep 25, 1935 81 y.o.  Admit date: 12/30/2016 Date of Consult: 01/01/2017  Primary Physician: Calla Kicks, MD Primary Cardiologist: Purvis Sheffield Electrophysiologist: Elberta Fortis  Patient Profile: Omar Williams is a 81 y.o. male with a history of hypertension and DM who is being seen today for the evaluation of VF arrest at the request of Dr Eldridge Dace.  HPI:  Omar Williams is a 81 y.o. male with no significant cardiac history.  He was vacation in [place], [place] recently.  He was doing well and was in his hotel room when he lost consciousness. He received bystander CPR and was taken to the hospital. He required short term intubation.  He underwent catheterization which demonstrated severe multivessel disease and CABG was recommended.  He wished to be managed closer to home and was discharged with LifeVest and presented to the [facility] ED for evaluation.  He has been seen by CT surgery here and does not feel that he is a candidate for CABG. Dr Eldridge Dace has reviewed films this morning and does not feel he has good targets for PCI. EP has been asked to evaluate for treatment options.   He continues this morning with chest soreness that he feels is from CPR.  He denies shortness of breath.  No prior syncope. No recent fevers or chills.   Past Medical History:  Diagnosis Date  . Acid reflux   . Cardiac arrest (HCC)   . Coronary artery disease   . Diabetes mellitus without complication (HCC)   . Hypertension   . Renal disorder      Surgical History: History reviewed. No pertinent surgical history.   Medications Prior to Admission  Medication Sig Dispense Refill Last Dose  . aspirin 325 MG tablet Take 325 mg by mouth at bedtime.   12/21/2016  . aspirin EC 81 MG tablet Take 81 mg by mouth daily.   12/21/2016  . atorvastatin (LIPITOR) 80 MG tablet Take 80 mg by mouth at bedtime.    12/21/2016  . carvedilol (COREG) 6.25  MG tablet Take 6.25 mg by mouth 2 (two) times daily.   12/21/2016 at 0500  . finasteride (PROSCAR) 5 MG tablet Take 5 mg by mouth at bedtime.    12/21/2016  . glimepiride (AMARYL) 4 MG tablet Take 4 mg by mouth 2 (two) times daily.    12/21/2016  . LANTUS SOLOSTAR 100 UNIT/ML Solostar Pen Inject 12 Units into the skin at bedtime.  1 12/20/2016  . losartan-hydrochlorothiazide (HYZAAR) 100-25 MG per tablet Take 1 tablet by mouth daily.   12/21/2016  . oxyCODONE-acetaminophen (PERCOCET/ROXICET) 5-325 MG per tablet Take 1-2 tablets by mouth every 4 (four) hours as needed for severe pain. 20 tablet 0 unknown at prn  . pantoprazole (PROTONIX) 40 MG tablet Take 40 mg by mouth daily.   12/21/2016    Inpatient Medications:  . aspirin EC  81 mg Oral Daily  . atorvastatin  80 mg Oral QHS  . carvedilol  6.25 mg Oral BID  . finasteride  5 mg Oral QHS  . glimepiride  4 mg Oral Daily  . losartan  100 mg Oral Daily   And  . hydrochlorothiazide  25 mg Oral Daily  . insulin aspart  0-15 Units Subcutaneous TID WC  . mouth rinse  15 mL Mouth Rinse BID  . pantoprazole  40 mg Oral Daily  . sodium chloride flush  3 mL Intravenous Q12H    Allergies: No Known  Allergies  Social History   Socioeconomic History  . Marital status: Married    Spouse name: Not on file  . Number of children: Not on file  . Years of education: Not on file  . Highest education level: Not on file  Social Needs  . Financial resource strain: Not on file  . Food insecurity - worry: Not on file  . Food insecurity - inability: Not on file  . Transportation needs - medical: Not on file  . Transportation needs - non-medical: Not on file  Occupational History  . Not on file  Tobacco Use  . Smoking status: Never Smoker  . Smokeless tobacco: Never Used  Substance and Sexual Activity  . Alcohol use: No  . Drug use: No  . Sexual activity: No  Other Topics Concern  . Not on file  Social History Narrative  . Not on file      Family History: no premature CAD    Review of Systems: All other systems reviewed and are otherwise negative except as noted above.  Physical Exam: Vitals:   01/01/17 0723 01/01/17 0759 01/01/17 0800 01/01/17 0900  BP:   (!) 114/59 122/60  Pulse: (!) 55  (!) 56 65  Resp: 14  15 19   Temp:  98.2 F (36.8 C)    TempSrc:  Oral    SpO2: 95%  96% 96%  Weight:      Height:        GEN- The patient is elderly appearing, alert and oriented x 3 today.   HEENT: normocephalic, atraumatic; sclera clear, conjunctiva pink; hearing intact; oropharynx clear; neck supple Lungs- Clear to ausculation bilaterally, normal work of breathing.  No wheezes, rales, rhonchi Heart- Regular rate and rhythm  GI- soft, non-tender, non-distended, bowel sounds present Extremities- no clubbing, cyanosis, or edema  MS- no significant deformity or atrophy Skin- warm and dry, no rash or lesion Psych- euthymic mood, full affect Neuro- strength and sensation are intact  Labs:   Lab Results  Component Value Date   WBC 12.6 (H) 01/01/2017   HGB 11.0 (L) 01/01/2017   HCT 34.5 (L) 01/01/2017   MCV 96.9 01/01/2017   PLT 329 01/01/2017    Recent Labs  Lab 01/01/17 0310  NA 133*  K 4.2  CL 104  CO2 22  BUN 21*  CREATININE 1.35*  CALCIUM 8.6*  GLUCOSE 128*      Radiology/Studies: No results found.  RUE:AVWUJEKG:sinus bradycardia, 1st degree AV block, LBBB, QRS 174msec (personally reviewed)  TELEMETRY: sinus rhythm (personally reviewed)  Assessment/Plan: 1.  OOH cardiac arrest He was found to have severe CAD but no targets for revascularization EF is 15-20%  He also has LBBB and meets criteria for CRT implant.  Discussed with patient who is agreeable and wishes to proceed. Will plan for later today Keep K>3.9, Mg >1.8 No driving x6 months  2.  CAD Severe No targets for revascularization Medical therapy planned   Signed, Gypsy BalsamAmber Seiler, NP 01/01/2017 9:25 AM   I have seen and examined this  patient with Gypsy BalsamAmber Seiler.  Agree with above, note added to reflect my findings.  On exam, RRR, no murmurs, lungs clear.  Patient had cardiac arrest while on vacation in [place].  He received bystander CPR and was taken to the hospital.  Cath showed multivessel coronary disease and CABG was recommended.  He wanted to be closer to home and thus he was discharged with a LifeVest and presented to the emergency room.  CT surgery does not feel like he is a candidate for operation.  Interventional cardiology does not feel that he is a candidate for intervention.  Due to his episode of cardiac arrest, will plan for ICD implantation.  He has a left bundle branch block and thus he would benefit from CRT as well.  Risks and benefits of the procedure were discussed.  Risks include bleeding, tamponade, infection, and pneumothorax among others.  He understands these risks and is agreed to the procedure.  Advised him no driving for 6 months.  Will M. Camnitz MD 01/01/2017 12:22 PM

## 2017-01-01 NOTE — Progress Notes (Signed)
Received order for pre-op consult however pt now not having CABG. Please order Cardiac Rehab Phase I for ambulation and education.  Ethelda Chick CES, ACSM 7:20 AM 01/01/2017

## 2017-01-02 ENCOUNTER — Inpatient Hospital Stay (HOSPITAL_COMMUNITY): Payer: Medicare Other

## 2017-01-02 DIAGNOSIS — I4901 Ventricular fibrillation: Secondary | ICD-10-CM

## 2017-01-02 LAB — CBC
HCT: 37.4 % — ABNORMAL LOW (ref 39.0–52.0)
Hemoglobin: 12.1 g/dL — ABNORMAL LOW (ref 13.0–17.0)
MCH: 31.3 pg (ref 26.0–34.0)
MCHC: 32.4 g/dL (ref 30.0–36.0)
MCV: 96.9 fL (ref 78.0–100.0)
PLATELETS: 273 10*3/uL (ref 150–400)
RBC: 3.86 MIL/uL — AB (ref 4.22–5.81)
RDW: 16.4 % — ABNORMAL HIGH (ref 11.5–15.5)
WBC: 14.4 10*3/uL — ABNORMAL HIGH (ref 4.0–10.5)

## 2017-01-02 LAB — GLUCOSE, CAPILLARY
GLUCOSE-CAPILLARY: 170 mg/dL — AB (ref 65–99)
Glucose-Capillary: 247 mg/dL — ABNORMAL HIGH (ref 65–99)
Glucose-Capillary: 171 mg/dL — ABNORMAL HIGH (ref 65–99)

## 2017-01-02 LAB — HEPARIN LEVEL (UNFRACTIONATED)

## 2017-01-02 MED ORDER — LOSARTAN POTASSIUM 50 MG PO TABS
50.0000 mg | ORAL_TABLET | Freq: Two times a day (BID) | ORAL | Status: DC
  Administered 2017-01-03: 50 mg via ORAL
  Filled 2017-01-02 (×3): qty 1

## 2017-01-02 MED ORDER — HYDROCHLOROTHIAZIDE 25 MG PO TABS
25.0000 mg | ORAL_TABLET | Freq: Every day | ORAL | Status: DC
  Filled 2017-01-02 (×2): qty 1

## 2017-01-02 NOTE — Progress Notes (Addendum)
CARDIAC REHAB PHASE I   Pt just returned to bed from chair. Pt very drowsy, BP low, pt not ready for ambulation at this time as discussed with pt's RN. Left education materials with pt and daughter including MI book, heart healthy and diabetes diet handouts and phase 2 cardiac rehab brochure. Discussed Phase 2 cardiac rehab, pt and daughter interested in program, will send referral to [facility]. Pt from home with wife, per pt's daughter, pt's wife unable to provide care for pt, pt reports general weakness, may benefit from PT consult prior to discharge. Pt in bed, call bell within reach. Will follow up Monday.    [id number] Joylene Grapes, RN, BSN 01/02/2017 11:36 AM

## 2017-01-02 NOTE — Progress Notes (Addendum)
Notified Angie PA with Cardiology of pt drop in BP after morning meds. Advised to change time of losartan. Will send message to Pharmacy. PT asymptomatic. Pt states "I feel tired". Will continue to monitor closely.

## 2017-01-02 NOTE — Progress Notes (Signed)
Progress Note  Patient Name: Omar Williams Date of Encounter: 01/02/2017  Primary Cardiologist: Elberta Fortis  Subjective   S/p BiV ICD doing well. No chest pain or sob.   Inpatient Medications    Scheduled Meds: . aspirin EC  81 mg Oral Daily  . atorvastatin  80 mg Oral QHS  . carvedilol  6.25 mg Oral BID  . finasteride  5 mg Oral QHS  . glimepiride  4 mg Oral Daily  . losartan  100 mg Oral Daily   And  . hydrochlorothiazide  25 mg Oral Daily  . insulin aspart  0-15 Units Subcutaneous TID WC  . mouth rinse  15 mL Mouth Rinse BID  . pantoprazole  40 mg Oral Daily  . sodium chloride flush  3 mL Intravenous Q12H   Continuous Infusions: . sodium chloride    .  ceFAZolin (ANCEF) IV Stopped (01/02/17 0604)  . nitroGLYCERIN Stopped (12/31/16 1113)   PRN Meds: sodium chloride, acetaminophen, nitroGLYCERIN, ondansetron (ZOFRAN) IV, sodium chloride flush   Vital Signs    Vitals:   01/02/17 0600 01/02/17 0700 01/02/17 0736 01/02/17 0800  BP: 115/65 119/66  116/64  Pulse: 63 66  63  Resp: (!) 22 19  17   Temp:   97.9 F (36.6 C)   TempSrc:   Oral   SpO2: 95% 97%  96%  Weight:      Height:        Intake/Output Summary (Last 24 hours) at 01/02/2017 1039 Last data filed at 01/02/2017 1000 Gross per 24 hour  Intake 703 ml  Output 1550 ml  Net -847 ml   Filed Weights   12/31/16 0400 01/01/17 0300 01/02/17 0500  Weight: 183 lb 3.2 oz (83.1 kg) 182 lb 15.7 oz (83 kg) 176 lb 5.9 oz (80 kg)    Telemetry    nsr with biv pacing - Personally Reviewed  ECG    NSR with biv pacing - Personally Reviewed  Physical Exam   GEN: No acute distress.   Neck: 7 cm JVD Cardiac: RRR, no murmurs, rubs, or gallops.  Respiratory: Clear to auscultation bilaterally. GI: Soft, nontender, non-distended  MS: No edema; No deformity. Neuro:  Nonfocal  Psych: Normal affect  Back - large area of ecchymosis  Labs    Chemistry Recent Labs  Lab 12/30/16 1738 12/31/16 0353 01/01/17 0310    NA 134* 134* 133*  K 4.6 4.5 4.2  CL 103 103 104  CO2 23 23 22   GLUCOSE 244* 209* 128*  BUN 24* 21* 21*  CREATININE 1.29* 1.32* 1.35*  CALCIUM 9.3 8.5* 8.6*  GFRNONAA 51* 50* 48*  GFRAA 59* 58* 56*  ANIONGAP 8 8 7      Hematology Recent Labs  Lab 12/30/16 1738 01/01/17 0310 01/02/17 0224  WBC 10.0 12.6* 14.4*  RBC 4.05* 3.56* 3.86*  HGB 12.7* 11.0* 12.1*  HCT 38.9* 34.5* 37.4*  MCV 96.0 96.9 96.9  MCH 31.4 30.9 31.3  MCHC 32.6 31.9 32.4  RDW 16.4* 16.5* 16.4*  PLT 376 329 273    Cardiac Enzymes Recent Labs  Lab 12/30/16 2229 12/31/16 0353 12/31/16 1018  TROPONINI 0.05* 0.03* 0.04*    Recent Labs  Lab 12/30/16 1744  TROPIPOC 0.04     BNPNo results for input(s): BNP, PROBNP in the last 168 hours.   DDimer No results for input(s): DDIMER in the last 168 hours.   Radiology    Dg Chest 2 View  Result Date: 01/02/2017 CLINICAL DATA:  Cardiac device in  situ EXAM: CHEST  2 VIEW COMPARISON:  None. FINDINGS: Left AICD in place with leads in the right atrium, right ventricle and coronary sinus. No pneumothorax. Cardiomegaly with vascular congestion and bibasilar atelectasis. IMPRESSION: Left AICD in place.  No pneumothorax. Cardiomegaly with vascular congestion and bibasilar atelectasis. Electronically Signed   By: Charlett NoseKevin  Dover M.D.   On: 01/02/2017 07:39    Cardiac Studies     Patient Profile     81 y.o. male with single vessel CAD, s/p VF arrest, severe LV dysfunction, no revascularization options admitted after driving back from [place] where he experienced his arrest. He is s/p BiV ICD insertion  Assessment & Plan    1. VF arrest - he is s/p biv ICD and no arrhythmias. 2. Chronic systolic heart failure - his symptoms are well controlled and he appears mostly euvolemic. Will transfer to tele bed.  3. Lower back ecchymosis - appears to be due to cath in [place]. He is not anemic to speak of and will undergo watchful waiting. I expect he will heal up  completely.  4. ICD - interogation of his device under my direct supervision demonstrates normal device function and his cxr looks good. Will follow.   For questions or updates, please contact [facility] Please consult [email or web address] for contact info under Cardiology/STEMI.      Signed, Lewayne BuntingGregg Ravenne Wayment, MD  01/02/2017, 10:39 AM  Patient ID: Omar DoveJerry Williams, male   DOB: 1935/10/25, 81 y.o.   MRN: [id number]

## 2017-01-02 NOTE — Discharge Instructions (Signed)
° ° °  Supplemental Discharge Instructions for  Pacemaker/Defibrillator Patients  Activity No heavy lifting or vigorous activity with your left/right arm for 6 to 8 weeks.  Do not raise your left/right arm above your head for one week.  Gradually raise your affected arm as drawn below.           __       01/06/17                    01/07/17                  01/08/17                   01/09/17  NO DRIVING for   6 months.  WOUND CARE - Keep the wound area clean and dry.  Do not get this area wet for one week. No showers for one week; you may shower on   01/09/17  . - The tape/steri-strips on your wound will fall off; do not pull them off.  No bandage is needed on the site.  DO  NOT apply any creams, oils, or ointments to the wound area. - If you notice any drainage or discharge from the wound, any swelling or bruising at the site, or you develop a fever > 101? F after you are discharged home, call the office at once.  Special Instructions - You are still able to use cellular telephones; use the ear opposite the side where you have your pacemaker/defibrillator.  Avoid carrying your cellular phone near your device. - When traveling through airports, show security personnel your identification card to avoid being screened in the metal detectors.  Ask the security personnel to use the hand wand. - Avoid arc welding equipment, MRI testing (magnetic resonance imaging), TENS units (transcutaneous nerve stimulators).  Call the office for questions about other devices. - Avoid electrical appliances that are in poor condition or are not properly grounded. - Microwave ovens are safe to be near or to operate.  Additional information for defibrillator patients should your device go off: - If your device goes off ONCE and you feel fine afterward, notify the device clinic nurses. - If your device goes off ONCE and you do not feel well afterward, call 911. - If your device goes off TWICE, call 911. - If your  device goes off THREE times in one day, call 911.  DO NOT DRIVE YOURSELF OR A FAMILY MEMBER WITH A DEFIBRILLATOR TO THE HOSPITAL--CALL 911.

## 2017-01-03 LAB — GLUCOSE, CAPILLARY
GLUCOSE-CAPILLARY: 165 mg/dL — AB (ref 65–99)
GLUCOSE-CAPILLARY: 164 mg/dL — AB (ref 65–99)
Glucose-Capillary: 139 mg/dL — ABNORMAL HIGH (ref 65–99)

## 2017-01-03 NOTE — Evaluation (Signed)
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  He was on vacation in [place] and suffered a cardiac arrest.  He required CPR and was intubated for a few days.  He was taken to [facility].  Ultimately, he underwent heart catheterization which showed severe multivessel coronary artery disease.  He recovered in the hospital.  Patient then transferred to [facility] to be closer to home.  Patient was found to be a poor candidate for CABG or for PCI.  Patient now s/p BiV ICD placement on 01/02/17.     PMH:  DM, HTN, Renal disorder.    Clinical Impression  Patient presents with problems listed below.  Will benefit from acute PT to maximize functional mobility prior to return home with family.  Patient with general weakness decreased balance, and decreased activity tolerance.  Recommend f/u HHPT at d/c for continued therapy.    Follow Up Recommendations Home health PT;Supervision for mobility/OOB    Equipment Recommendations  None recommended by PT    Recommendations for Other Services       Precautions / Restrictions Precautions Precautions: Fall;ICD/Pacemaker Precaution Comments: Reviewed LUE precautions due to ICD placement Required Braces or Orthoses: Sling Restrictions Weight Bearing Restrictions: Yes LUE Weight Bearing: Non weight bearing      Mobility  Bed Mobility               General bed mobility comments: Patient in chair as PT entered room  Transfers Overall transfer level: Needs assistance Equipment used: Rolling walker (2 wheeled) Transfers: Sit to/from Stand Sit to Stand: Min guard         General transfer comment: Verbal cues for hand placement.  Assist for safety only.  Ambulation/Gait Ambulation/Gait assistance: Min guard Ambulation Distance (Feet): 110 Feet Assistive device: Rolling walker (2 wheeled) Gait  Pattern/deviations: Step-through pattern;Decreased stride length;Shuffle;Trunk flexed Gait velocity: decreased Gait velocity interpretation: Below normal speed for age/gender General Gait Details: Verbal cues for safe use of RW, and to look up during gait.  Patient with slow, guarded gait for safety.  No loss of balance during gait with RW;  Stairs            Wheelchair Mobility    Modified Rankin (Stroke Patients Only)       Balance Overall balance assessment: Needs assistance         Standing balance support: Single extremity supported Standing balance-Leahy Scale: Poor                               Pertinent Vitals/Pain Pain Assessment: 0-10 Pain Score: 4  Pain Location: chest wall during coughing Pain Descriptors / Indicators: Sore Pain Intervention(s): Monitored during session    Home Living Family/patient expects to be discharged to:: Private residence Living Arrangements: Spouse/significant other;Children Available Help at Discharge: Family;Available 24 hours/day Type of Home: House Home Access: Ramped entrance     Home Layout: One level Home Equipment: Walker - 2 wheels;Shower seat - built in;Bedside commode      Prior Function Level of Independence: Independent         Comments: Patient is [employer] his [employer].  Drives.      Hand Dominance   Dominant Hand: Right    Extremity/Trunk Assessment   Upper Extremity Assessment Upper Extremity Assessment: LUE deficits/detail LUE Deficits / Details:  shoulder strength/ROM limited due to ICD precautions    Lower Extremity Assessment Lower Extremity Assessment: Generalized weakness    Cervical / Trunk Assessment Cervical / Trunk Assessment: Kyphotic(with forward head)  Communication   Communication: No difficulties  Cognition Arousal/Alertness: Awake/alert Behavior During Therapy: WFL for tasks assessed/performed Overall Cognitive Status: Impaired/Different from baseline Area of  Impairment: Orientation                 Orientation Level: Disoriented to;Time;Situation                    General Comments      Exercises     Assessment/Plan    PT Assessment Patient needs continued PT services  PT Problem List Decreased strength;Decreased activity tolerance;Decreased balance;Decreased mobility;Decreased cognition;Decreased knowledge of use of DME;Cardiopulmonary status limiting activity;Pain       PT Treatment Interventions DME instruction;Gait training;Functional mobility training;Therapeutic activities;Therapeutic exercise;Patient/family education    PT Goals (Current goals can be found in the Care Plan section)  Acute Rehab PT Goals Patient Stated Goal: To return home PT Goal Formulation: With patient/family Time For Goal Achievement: 01/10/17 Potential to Achieve Goals: Good    Frequency Min 3X/week   Barriers to discharge        Co-evaluation               AM-PAC PT "6 Clicks" Daily Activity  Outcome Measure Difficulty turning over in bed (including adjusting bedclothes, sheets and blankets)?: None Difficulty moving from lying on back to sitting on the side of the bed? : Unable Difficulty sitting down on and standing up from a chair with arms (e.g., wheelchair, bedside commode, etc,.)?: Unable Help needed moving to and from a bed to chair (including a wheelchair)?: A Little Help needed walking in hospital room?: A Little Help needed climbing 3-5 steps with a railing? : A Lot 6 Click Score: 14    End of Session Equipment Utilized During Treatment: Gait belt Activity Tolerance: Patient tolerated treatment well;Patient limited by fatigue Patient left: in chair;with call bell/phone within reach;with family/visitor present Nurse Communication: Mobility status PT Visit Diagnosis: Other abnormalities of gait and mobility (R26.89);Muscle weakness (generalized) (M62.81);Pain Pain - part of body: (chest wall)    Time:  [id number] PT Time Calculation (min) (ACUTE ONLY): 26 min   Charges:   PT Evaluation $PT Eval Moderate Complexity: 1 Mod PT Treatments $Gait Training: 8-22 mins   PT G Codes:        Durenda HurtSusan H. Renaldo Fiddleravis, PT, [facility] Acute Rehab Services Pager [phone]   Vena AustriaSusan H Starlet Gallentine 01/03/2017, 7:37 PM

## 2017-01-03 NOTE — Progress Notes (Signed)
Progress Note  Patient Name: Omar DoveJerry Benningfield Date of Encounter: 01/03/2017  Primary Cardiologist: Dr. Eldridge DaceVaranasi  Subjective   No chest pain or sob. Feels a little tired today. Has not ambulated.   Inpatient Medications    Scheduled Meds: . aspirin EC  81 mg Oral Daily  . atorvastatin  80 mg Oral QHS  . carvedilol  6.25 mg Oral BID  . finasteride  5 mg Oral QHS  . glimepiride  4 mg Oral Daily  . hydrochlorothiazide  25 mg Oral Daily  . insulin aspart  0-15 Units Subcutaneous TID WC  . losartan  50 mg Oral BID  . mouth rinse  15 mL Mouth Rinse BID  . pantoprazole  40 mg Oral Daily  . sodium chloride flush  3 mL Intravenous Q12H   Continuous Infusions: . sodium chloride    . nitroGLYCERIN Stopped (12/31/16 1113)   PRN Meds: sodium chloride, acetaminophen, nitroGLYCERIN, ondansetron (ZOFRAN) IV, sodium chloride flush   Vital Signs    Vitals:   01/02/17 2025 01/02/17 2046 01/03/17 0637 01/03/17 0900  BP: 118/61 (!) 110/56 106/73 107/63  Pulse: 78  79 78  Resp:    17  Temp: 98.3 F (36.8 C)  98 F (36.7 C) 98 F (36.7 C)  TempSrc: Oral  Oral Oral  SpO2: 96%  95% 94%  Weight:   173 lb (78.5 kg)   Height: 5\' 9"  (1.753 m)       Intake/Output Summary (Last 24 hours) at 01/03/2017 1143 Last data filed at 01/03/2017 0800 Gross per 24 hour  Intake 410 ml  Output 650 ml  Net -240 ml   Filed Weights   01/01/17 0300 01/02/17 0500 01/03/17 0637  Weight: 182 lb 15.7 oz (83 kg) 176 lb 5.9 oz (80 kg) 173 lb (78.5 kg)    Telemetry    nsr with ventricular pacing - Personally Reviewed  ECG    none - Personally Reviewed  Physical Exam   GEN: No acute distress.   Neck: 6 cm JVD Cardiac: RRR, no murmurs, rubs, or gallops.  Respiratory: Clear to auscultation bilaterally. GI: Soft, nontender, non-distended  MS: No edema; No deformity. Neuro:  Nonfocal  Psych: Normal affect   Labs    Chemistry Recent Labs  Lab 12/30/16 1738 12/31/16 0353 01/01/17 0310  NA 134*  134* 133*  K 4.6 4.5 4.2  CL 103 103 104  CO2 23 23 22   GLUCOSE 244* 209* 128*  BUN 24* 21* 21*  CREATININE 1.29* 1.32* 1.35*  CALCIUM 9.3 8.5* 8.6*  GFRNONAA 51* 50* 48*  GFRAA 59* 58* 56*  ANIONGAP 8 8 7      Hematology Recent Labs  Lab 12/30/16 1738 01/01/17 0310 01/02/17 0224  WBC 10.0 12.6* 14.4*  RBC 4.05* 3.56* 3.86*  HGB 12.7* 11.0* 12.1*  HCT 38.9* 34.5* 37.4*  MCV 96.0 96.9 96.9  MCH 31.4 30.9 31.3  MCHC 32.6 31.9 32.4  RDW 16.4* 16.5* 16.4*  PLT 376 329 273    Cardiac Enzymes Recent Labs  Lab 12/30/16 2229 12/31/16 0353 12/31/16 1018  TROPONINI 0.05* 0.03* 0.04*    Recent Labs  Lab 12/30/16 1744  TROPIPOC 0.04     BNPNo results for input(s): BNP, PROBNP in the last 168 hours.   DDimer No results for input(s): DDIMER in the last 168 hours.   Radiology    Dg Chest 2 View  Result Date: 01/02/2017 CLINICAL DATA:  Cardiac device in situ EXAM: CHEST  2 VIEW COMPARISON:  None. FINDINGS: Left AICD in place with leads in the right atrium, right ventricle and coronary sinus. No pneumothorax. Cardiomegaly with vascular congestion and bibasilar atelectasis. IMPRESSION: Left AICD in place.  No pneumothorax. Cardiomegaly with vascular congestion and bibasilar atelectasis. Electronically Signed   By: Charlett Nose M.D.   On: 01/02/2017 07:39   Dg Knee Ap/lat W/sunrise Left  Result Date: 01/02/2017 CLINICAL DATA:  Fall on knee.  Bruising, pain EXAM: LEFT KNEE 3 VIEWS COMPARISON:  None. FINDINGS: Soft tissue swelling anteriorly. No underlying bony abnormality. No fracture, subluxation or dislocation. No joint effusion. IMPRESSION: Anterior soft tissue swelling.  No acute bony abnormality. Electronically Signed   By: Charlett Nose M.D.   On: 01/02/2017 14:52    Cardiac Studies   none  Patient Profile     81 y.o. male with unrevascularizable CAD, LBBB, s/p BiV ICD insertion  Assessment & Plan    1. ICM - he denies anginal symptoms. He will continue his  current meds. 2. Chronic systolic heart failure - he remains weak. He will continue his current meds. His pressures are low normal. 3. ICD - his biv ICD is working normally. 4. Disp. - he will ambulate today and plan to dc home tomorrow.   Gregg Taylor,M.D.  For questions or updates, please contact [facility] Please consult [email or web address] for contact info under Cardiology/STEMI.      Signed, Lewayne Bunting, MD  01/03/2017, 11:43 AM  Patient ID: Omar Williams, male   DOB: 1936/01/24, 81 y.o.   MRN: [id number]

## 2017-01-04 ENCOUNTER — Encounter (HOSPITAL_COMMUNITY): Payer: Self-pay | Admitting: Cardiology

## 2017-01-04 DIAGNOSIS — I5023 Acute on chronic systolic (congestive) heart failure: Secondary | ICD-10-CM

## 2017-01-04 DIAGNOSIS — I5022 Chronic systolic (congestive) heart failure: Secondary | ICD-10-CM

## 2017-01-04 DIAGNOSIS — I255 Ischemic cardiomyopathy: Secondary | ICD-10-CM

## 2017-01-04 DIAGNOSIS — E785 Hyperlipidemia, unspecified: Secondary | ICD-10-CM

## 2017-01-04 DIAGNOSIS — I214 Non-ST elevation (NSTEMI) myocardial infarction: Principal | ICD-10-CM

## 2017-01-04 DIAGNOSIS — E1169 Type 2 diabetes mellitus with other specified complication: Secondary | ICD-10-CM

## 2017-01-04 DIAGNOSIS — I252 Old myocardial infarction: Secondary | ICD-10-CM

## 2017-01-04 LAB — GLUCOSE, CAPILLARY
Glucose-Capillary: 150 mg/dL — ABNORMAL HIGH (ref 65–99)
Glucose-Capillary: 200 mg/dL — ABNORMAL HIGH (ref 65–99)

## 2017-01-04 MED ORDER — NITROGLYCERIN 0.4 MG SL SUBL: 0.4000 mg | SUBLINGUAL_TABLET | SUBLINGUAL | 2 refills | Status: DC | PRN

## 2017-01-04 MED ORDER — LOSARTAN POTASSIUM 50 MG PO TABS: 50.0000 mg | ORAL_TABLET | Freq: Two times a day (BID) | ORAL | 2 refills | Status: DC

## 2017-01-04 NOTE — Progress Notes (Signed)
MD notified of patient's b/p 80/40 taken manually. Patient is asymptomatic at this time. Alert and oriented. Denies lightheadedness or dizziness.  MD called back. No new orders at this time. Will continue to monitor patient for confusion, lightheadedness, dizziness and faintness.

## 2017-01-04 NOTE — Progress Notes (Signed)
Came to ambulate and educate however pt preparing for d/c. Discussed/reviewed MI, diet, ex gl, NTG, and CRPII. Will send referral to [facility] CRPII. Pt and family voiced understanding.  [phone] Ethelda Chick CES, ACSM 2:08 PM 01/04/2017

## 2017-01-04 NOTE — Care Management Note (Addendum)
Case Management Note  Patient Details  Name: Omar Williams MRN: [id number] Date of Birth: 11/05/1935  Subjective/Objective:  Admitted with CHF, recent cardiac arrest in [place].  Not a candidate for CABG.                 Action/Plan:  Pt from home with spouse. Patient has PCP. Per PT evaluation, recommend Pt home with [facility] and PT.  Will need HH orders prior to discharge. CM will continue to follow for  [facility] and PT orders for transition home.  Expected Discharge Date:                  Expected Discharge Plan:  Home w Home Health Services  In-House Referral:     Discharge planning Services  CM Consult  Post Acute Care Choice:   Home Health Choice offered to:    spouse  DME Arranged:    DME Agency:     HH Arranged:   PT HH Agency:    [facility] Status of Service: Completed  If discussed at Long Length of Stay Meetings, dates discussed:    Additional Comments:   01/04/2017 16:14 Per PT evaluation recommended HH w/ PT.  Went to Pt room to offer choice, Pt already discharged.  Spoke with Spouse via telephone call [name] and offered [facility] choice.  Spouse selected [facility].  Advised will submit referral for PT and expect return call from [facility]: [facility]. Spouse verbalized understanding. Spoke with Lupita Leash with [facility], advised of referral for [facility] order for PT, referral accepted.  Yancey Flemings, RN 01/04/2017, 12:07 PM

## 2017-01-04 NOTE — Progress Notes (Signed)
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  He was on vacation in [place] and suffered a cardiac arrest.  He required CPR and was intubated for a few days.  [facility]  Ultimately, he underwent heart catheterization which showed severe multivessel coronary artery disease.  He recovered in the hospital.  [facility] to [facility].  Patient was found to be a poor candidate for CABG or for PCI.  Patient now s/p BiV ICD placement on 01/02/17.     PMH:  DM, HTN, Renal disorder.    PT Comments    Patient is making progress toward mobility goals and tolerated increased gait distance. LOB X1 when pt took UE from RW and required increased assist to recover. Omar Williams present in room. Pt will continue to benefit from further skilled PT services to maximize independence and safety with mobility.    Follow Up Recommendations  Home health PT;Supervision for mobility/OOB     Equipment Recommendations  None recommended by PT    Recommendations for Other Services       Precautions / Restrictions Precautions Precautions: Fall;ICD/Pacemaker Precaution Comments: Reviewed LUE precautions due to ICD placement Required Braces or Orthoses: Sling Restrictions Weight Bearing Restrictions: Yes LUE Weight Bearing: Non weight bearing    Mobility  Bed Mobility Overal bed mobility: Needs Assistance Bed Mobility: Rolling;Sidelying to Sit Rolling: Supervision Sidelying to sit: Supervision       General bed mobility comments: cues for sequencing and technique  Transfers Overall transfer level: Needs assistance Equipment used: Rolling walker (2 wheeled) Transfers: Sit to/from Stand Sit to Stand: Min guard         General transfer comment: cues for hand placement; pt able to stand without using L  UE  Ambulation/Gait Ambulation/Gait assistance: Min assist;Min guard Ambulation Distance (Feet): 290 Feet Assistive device: Rolling walker (2 wheeled) Gait Pattern/deviations: Step-through pattern;Decreased stride length;Trunk flexed Gait velocity: decreased   General Gait Details: SOB but unable to get SpO2 reading due to poor waveform when ambulating; SpO2 when returned to room 97% on RA; cues for posture, use of AD, and increased stride length; pt with one LOB requiring increased assist to recover when taking UE off of RW   Stairs            Wheelchair Mobility    Modified Rankin (Stroke Patients Only)       Balance Overall balance assessment: Needs assistance   Sitting balance-Leahy Scale: Good     Standing balance support: Single extremity supported Standing balance-Leahy Scale: Poor                              Cognition Arousal/Alertness: Awake/alert Behavior During Therapy: WFL for tasks assessed/performed Overall Cognitive Status: Within Functional Limits for tasks assessed                                        Exercises      General Comments General comments (skin integrity, edema, etc.): Omar Williams present in room       Pertinent Vitals/Pain Pain Assessment: Faces Faces Pain Scale: Hurts a little bit Pain Location: chest wall during coughing Pain Descriptors / Indicators: Sore Pain Intervention(s):  Monitored during session;Repositioned    Home Living                      Prior Function            PT Goals (current goals can now be found in the care plan section) Acute Rehab PT Goals Patient Stated Goal: To return home PT Goal Formulation: With patient/family Time For Goal Achievement: 01/10/17 Potential to Achieve Goals: Good Progress towards PT goals: Progressing toward goals    Frequency    Min 3X/week      PT Plan Current plan remains appropriate    Co-evaluation               AM-PAC PT "6 Clicks" Daily Activity  Outcome Measure  Difficulty turning over in bed (including adjusting bedclothes, sheets and blankets)?: None Difficulty moving from lying on back to sitting on the side of the bed? : Unable Difficulty sitting down on and standing up from a chair with arms (e.g., wheelchair, bedside commode, etc,.)?: Unable Help needed moving to and from a bed to chair (including a wheelchair)?: A Little Help needed walking in hospital room?: A Little Help needed climbing 3-5 steps with a railing? : A Little 6 Click Score: 15    End of Session Equipment Utilized During Treatment: Gait belt Activity Tolerance: Patient tolerated treatment well Patient left: in chair;with call bell/phone within reach;with family/visitor present Nurse Communication: Mobility status PT Visit Diagnosis: Other abnormalities of gait and mobility (R26.89);Muscle weakness (generalized) (M62.81);Pain Pain - part of body: (chest wall)     Time: 5945-8592 PT Time Calculation (min) (ACUTE ONLY): 31 min  Charges:  $Gait Training: 8-22 mins $Therapeutic Activity: 8-22 mins                    G Codes:       Omar Williams, PTA Pager: [phone]     Omar Williams 01/04/2017, 12:45 PM

## 2017-01-04 NOTE — Discharge Summary (Signed)
Discharge Summary    Patient ID: Omar Williams,  MRN: [id number], DOB/AGE: 79-Apr-1937 79 y.o.  Admit date: 12/30/2016 Discharge date: 01/04/2017  Primary Care Provider: Calla Kicksabezudo, Ignacio Primary Cardiologist: Norina BuzzardNew (Koneswaran)  Discharge Diagnoses    Active Problems:   NSTEMI (non-ST elevated myocardial infarction) [facility](HCC)   Unstable angina (HCC)   Acute on chronic systolic CHF (congestive heart failure) (HCC)   Ischemic cardiomyopathy   Hyperlipidemia   Allergies No Known Allergies  Diagnostic Studies/Procedures    TTE: 12/31/16  Study Conclusions  - Left ventricle: LVEF is approximately 15 to 20%with diffuse hypokinesis. The cavity size was normal. Wall thickness was normal. - Aortic valve: There was mild regurgitation. - Right ventricle: Systolic function was mildly reduced. 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  He required CPR and was intubated for a few days.  [facility]  Ultimately, he underwent heart catheterization which showed severe multivessel coronary artery disease.  He recovered in the hospital.  CABG was recommended but the patient wanted to be close to home when he had his surgery.  He decided to come back to [place].  Apparently, the doctor in [place] spoke with Dr. Laneta SimmersBartle here regarding transfer of the patient.  The patient was told that he could go to admitting here and there was a room ready.  He showed up at admitting not feeling well and no one had any knowledge of his transfer.  In addition, the patient came with a LifeVest in a personal vehicle.  The family stated that they were told it would be over $10,000 to take him in an ambulance.  He has found the LifeVest uncomfortable.  He presented to admitting for admission, but went to the ED for further evaluation as he was having chest pain.    Hospital Course     Consultants: TCTS, EP  He was admitted and started on IV nitro and low dose BB. He was seen by Dr. Laneta SimmersBartle and felt not to be a candidate for CABG. Films were further reviewed by interventional cardiology and felt that PCI was not an option. Plan for medical therapy. Follow up echo here showed EF of 15%. EP consulted and underwent successful BiVi ICD placement via Dr. Girard Cooteramintz. No complications note post op. He was seen by PT and recommended home health PT. Did have some difficulty titrating up his medical therapy, therefore his HCTZ was stopped. Consider aldactone as outpatient is blood pressure improves to tolerate.     Omar Williams was seen by Dr. SwazilandJordan and determined stable for discharge home. Follow up in the office has been arranged. Medications are listed below.   _____________  Discharge Vitals Blood pressure 92/63, pulse 72, temperature 97.6 F (36.4 C), temperature source Oral, resp. rate 20, height 5\' 9"  (1.753 m), weight 170 lb 9.6 oz (77.4 kg), SpO2 96 %.  Filed Weights   01/02/17 0500 01/03/17 0637 01/04/17 0357  Weight: 176 lb 5.9 oz (80 kg) 173 lb (78.5 kg) 170 lb 9.6 oz (77.4 kg)    Labs & Radiologic Studies    CBC Recent Labs    01/02/17 0224  WBC 14.4*  HGB 12.1*  HCT 37.4*  MCV 96.9  PLT 273   Basic Metabolic Panel No results for input(s): NA, K, CL, CO2, GLUCOSE, BUN, CREATININE, CALCIUM, MG, PHOS in the  last 72 hours. Liver Function Tests No results for input(s): AST, ALT, ALKPHOS, BILITOT, PROT, ALBUMIN in the last 72 hours. No results for input(s): LIPASE, AMYLASE in the last 72 hours. Cardiac Enzymes No results for input(s): CKTOTAL, CKMB, CKMBINDEX, TROPONINI in the last 72 hours. BNP Invalid input(s): POCBNP D-Dimer No results for input(s): DDIMER in the last 72 hours. Hemoglobin A1C No results for input(s): HGBA1C in the last 72 hours. Fasting Lipid Panel No results for input(s): CHOL, HDL, LDLCALC, TRIG, CHOLHDL, LDLDIRECT  in the last 72 hours. Thyroid Function Tests No results for input(s): TSH, T4TOTAL, T3FREE, THYROIDAB in the last 72 hours.  Invalid input(s): FREET3 _____________  Dg Chest 2 View  Result Date: 01/02/2017 CLINICAL DATA:  Cardiac device in situ EXAM: CHEST  2 VIEW COMPARISON:  None. FINDINGS: Left AICD in place with leads in the right atrium, right ventricle and coronary sinus. No pneumothorax. Cardiomegaly with vascular congestion and bibasilar atelectasis. IMPRESSION: Left AICD in place.  No pneumothorax. Cardiomegaly with vascular congestion and bibasilar atelectasis. Electronically Signed   By: Charlett Nose M.D.   On: 01/02/2017 07:39   Dg Knee Ap/lat W/sunrise Left  Result Date: 01/02/2017 CLINICAL DATA:  Fall on knee.  Bruising, pain EXAM: LEFT KNEE 3 VIEWS COMPARISON:  None. FINDINGS: Soft tissue swelling anteriorly. No underlying bony abnormality. No fracture, subluxation or dislocation. No joint effusion. IMPRESSION: Anterior soft tissue swelling.  No acute bony abnormality. Electronically Signed   By: Charlett Nose M.D.   On: 01/02/2017 14:52   Disposition   Pt is being discharged home today in good condition.  Follow-up Plans & Appointments    Follow-up Information    [facility] [address] [facility] Follow up on 01/13/2017.   Specialty:  Cardiology Why:  at [facility] information: [address] [place] [address] [phone]       Laqueta Linden, MD Follow up on 01/27/2017.   Specialty:  Cardiology Why:  at 9:20AM Contact information: [address] [place] [address] [phone]        Hillis Range, MD Follow up on 04/02/2017.   Specialty:  Cardiology Why:  at 10:45AM Contact information: [address] [place] [address] [phone]        Ellsworth Lennox, PA-C Follow up on 01/19/2017.   Specialties:  Physician Assistant, Cardiology Why:  at 3pm for your follow up appt.  Contact  information: [address] [place] [address] [phone]          Discharge Instructions    (HEART FAILURE PATIENTS) Call MD:  Anytime you have any of the following symptoms: 1) 3 pound weight gain in 24 hours or 5 pounds in 1 week 2) shortness of breath, with or without a dry hacking cough 3) swelling in the hands, feet or stomach 4) if you have to sleep on extra pillows at night in order to breathe.   Complete by:  As directed    Amb Referral to Cardiac Rehabilitation   Complete by:  As directed    Diagnosis:  NSTEMI   Diet - low sodium heart healthy   Complete by:  As directed    Discharge instructions   Complete by:  As directed    For patients with congestive heart failure, we give them these special instructions:  1. Follow a low-salt diet and watch your fluid intake. In general, you should not be taking in more than  2 liters of fluid per day (no more than 8 glasses per day). Some patients are restricted to less than 1.5 liters of fluid per day (no more than 6 glasses per day). This includes sources of water in foods like soup, coffee, tea, milk, etc. 2. Weigh yourself on the same scale at same time of day and keep a log. 3. Call your doctor: (Anytime you feel any of the following symptoms)  - 3-4 pound weight gain in 1-2 days or 2 pounds overnight  - Shortness of breath, with or without a dry hacking cough  - Swelling in the hands, feet or stomach  - If you have to sleep on extra pillows at night in order to breathe   IT IS IMPORTANT TO LET YOUR DOCTOR KNOW EARLY ON IF YOU ARE HAVING SYMPTOMS SO WE CAN HELP YOU!  Please be sure to keep all of your follow up appts.   Discharge instructions   Complete by:  As directed    Supplemental Discharge Instructions for  Pacemaker/Defibrillator Patients  Activity No heavy lifting or vigorous activity with your left/right arm for 6 to 8 weeks.  Do not raise your left/right arm above your head for one week.  Gradually raise your  affected arm as drawn below.           __  NO DRIVING until cleared by your cardiologist  WOUND CARE Keep the wound area clean and dry.  Do not get this area wet for one week. No showers for one week; you may shower on     . The tape/steri-strips on your wound will fall off; do not pull them off.  No bandage is needed on the site.  DO  NOT apply any creams, oils, or ointments to the wound area. If you notice any drainage or discharge from the wound, any swelling or bruising at the site, or you develop a fever > 101? F after you are discharged home, call the office at once.  Special Instructions You are still able to use cellular telephones; use the ear opposite the side where you have your pacemaker/defibrillator.  Avoid carrying your cellular phone near your device. When traveling through airports, show security personnel your identification card to avoid being screened in the metal detectors.  Ask the security personnel to use the hand wand. Avoid arc welding equipment, MRI testing (magnetic resonance imaging), TENS units (transcutaneous nerve stimulators).  Call the office for questions about other devices. Avoid electrical appliances that are in poor condition or are not properly grounded. Microwave ovens are safe to be near or to operate.  Additional information for defibrillator patients should your device go off: If your device goes off ONCE and you feel fine afterward, notify the device clinic nurses. If your device goes off ONCE and you do not feel well afterward, call 911. If your device goes off TWICE, call 911. If your device goes off THREE times in one day, call 911.  DO NOT DRIVE YOURSELF OR A FAMILY MEMBER WITH A DEFIBRILLATOR TO THE HOSPITAL-CALL 911.   Face-to-face encounter (required for Medicare/Medicaid patients)   Complete by:  As directed    I Laverda Page certify that this patient is under my care and that I, or a nurse practitioner or physician's assistant  working with me, had a face-to-face encounter that meets the physician face-to-face encounter requirements with this patient on 01/04/2017. The encounter with the patient was in whole, or in part for the following medical condition(s)  which is the primary reason for home health care (List medical condition): Deconditioning, CAD.   The encounter with the patient was in whole, or in part, for the following medical condition, which is the primary reason for home health care:  Deconditioning, CAD   I certify that, based on my findings, the following services are medically necessary home health services:  Physical therapy   Reason for Medically Necessary Home Health Services:  Skilled Nursing- Change/Decline in Patient Status   My clinical findings support the need for the above services:  Unsafe ambulation due to balance issues   Further, I certify that my clinical findings support that this patient is homebound due to:  Ambulates short distances less than 300 feet   Home Health   Complete by:  As directed    To provide the following care/treatments:  PT   Increase activity slowly   Complete by:  As directed       Discharge Medications     Medication List    STOP taking these medications   losartan-hydrochlorothiazide 100-25 MG tablet Commonly known as:  HYZAAR     TAKE these medications   aspirin EC 81 MG tablet Take 81 mg by mouth daily. What changed:  Another medication with the same name was removed. Continue taking this medication, and follow the directions you see here.   atorvastatin 80 MG tablet Commonly known as:  LIPITOR Take 80 mg by mouth at bedtime.   carvedilol 6.25 MG tablet Commonly known as:  COREG Take 6.25 mg by mouth 2 (two) times daily.   finasteride 5 MG tablet Commonly known as:  PROSCAR Take 5 mg by mouth at bedtime.   glimepiride 4 MG tablet Commonly known as:  AMARYL Take 4 mg by mouth 2 (two) times daily.   LANTUS SOLOSTAR 100 UNIT/ML Solostar  Pen Generic drug:  Insulin Glargine Inject 12 Units into the skin at bedtime.   losartan 50 MG tablet Commonly known as:  COZAAR Take 1 tablet (50 mg total) by mouth 2 (two) times daily.   nitroGLYCERIN 0.4 MG SL tablet Commonly known as:  NITROSTAT Place 1 tablet (0.4 mg total) under the tongue every 5 (five) minutes x 3 doses as needed for chest pain.   oxyCODONE-acetaminophen 5-325 MG tablet Commonly known as:  PERCOCET/ROXICET Take 1-2 tablets by mouth every 4 (four) hours as needed for severe pain.   pantoprazole 40 MG tablet Commonly known as:  PROTONIX Take 40 mg by mouth daily.         Outstanding Labs/Studies   N/a   Duration of Discharge Encounter   Greater than 30 minutes including physician time.  Signed, Laverda Page NP-C 01/04/2017, 12:53 PM

## 2017-01-04 NOTE — Progress Notes (Signed)
Progress Note  Patient Name: Omar Williams Date of Encounter: 01/04/2017  Primary Cardiologist: Ellis Parents Purvis Sheffield)  Subjective   Feeling well this morning. Worked with PT and did not have any episodes of chest pain, dizziness or lightheadedness.   Inpatient Medications    Scheduled Meds: . aspirin EC  81 mg Oral Daily  . atorvastatin  80 mg Oral QHS  . carvedilol  6.25 mg Oral BID  . finasteride  5 mg Oral QHS  . glimepiride  4 mg Oral Daily  . hydrochlorothiazide  25 mg Oral Daily  . insulin aspart  0-15 Units Subcutaneous TID WC  . losartan  50 mg Oral BID  . mouth rinse  15 mL Mouth Rinse BID  . pantoprazole  40 mg Oral Daily  . sodium chloride flush  3 mL Intravenous Q12H   Continuous Infusions: . sodium chloride    . nitroGLYCERIN Stopped (12/31/16 1113)   PRN Meds: sodium chloride, acetaminophen, nitroGLYCERIN, ondansetron (ZOFRAN) IV, sodium chloride flush   Vital Signs    Vitals:   01/03/17 0900 01/03/17 1700 01/03/17 2016 01/04/17 0357  BP: 107/63 109/69 122/70 92/75  Pulse: 78 79 73   Resp: 17 (!) 21 20   Temp: 98 F (36.7 C)  98.6 F (37 C) 97.7 F (36.5 C)  TempSrc: Oral  Oral Oral  SpO2: 94% 100% 96% 93%  Weight:    170 lb 9.6 oz (77.4 kg)  Height:        Intake/Output Summary (Last 24 hours) at 01/04/2017 1057 Last data filed at 01/04/2017 0100 Gross per 24 hour  Intake 600 ml  Output 800 ml  Net -200 ml   Filed Weights   01/02/17 0500 01/03/17 0637 01/04/17 0357  Weight: 176 lb 5.9 oz (80 kg) 173 lb (78.5 kg) 170 lb 9.6 oz (77.4 kg)    Telemetry    SR vpaced - Personally Reviewed   Physical Exam   General: Pleasant older male pale appearing in no acute distress. Head: Normocephalic, atraumatic.  Neck: Supple without bruits, JVD. Lungs:  Resp regular and unlabored, CTA. Heart: RRR, S1, S2, no S3, S4, or murmur; no rub. Abdomen: Soft, non-tender, non-distended with normoactive bowel sounds.  Extremities: No clubbing, cyanosis, edema.  Distal pedal pulses are 2+ bilaterally. Neuro: Alert and oriented X 3. Moves all extremities spontaneously. Psych: Normal affect.  Labs    Chemistry Recent Labs  Lab 12/30/16 1738 12/31/16 0353 01/01/17 0310  NA 134* 134* 133*  K 4.6 4.5 4.2  CL 103 103 104  CO2 23 23 22   GLUCOSE 244* 209* 128*  BUN 24* 21* 21*  CREATININE 1.29* 1.32* 1.35*  CALCIUM 9.3 8.5* 8.6*  GFRNONAA 51* 50* 48*  GFRAA 59* 58* 56*  ANIONGAP 8 8 7      Hematology Recent Labs  Lab 12/30/16 1738 01/01/17 0310 01/02/17 0224  WBC 10.0 12.6* 14.4*  RBC 4.05* 3.56* 3.86*  HGB 12.7* 11.0* 12.1*  HCT 38.9* 34.5* 37.4*  MCV 96.0 96.9 96.9  MCH 31.4 30.9 31.3  MCHC 32.6 31.9 32.4  RDW 16.4* 16.5* 16.4*  PLT 376 329 273    Cardiac Enzymes Recent Labs  Lab 12/30/16 2229 12/31/16 0353 12/31/16 1018  TROPONINI 0.05* 0.03* 0.04*    Recent Labs  Lab 12/30/16 1744  TROPIPOC 0.04     BNPNo results for input(s): BNP, PROBNP in the last 168 hours.   DDimer No results for input(s): DDIMER in the last 168 hours.    Radiology  Dg Knee Ap/lat W/sunrise Left  Result Date: 01/02/2017 CLINICAL DATA:  Fall on knee.  Bruising, pain EXAM: LEFT KNEE 3 VIEWS COMPARISON:  None. FINDINGS: Soft tissue swelling anteriorly. No underlying bony abnormality. No fracture, subluxation or dislocation. No joint effusion. IMPRESSION: Anterior soft tissue swelling.  No acute bony abnormality. Electronically Signed   By: Charlett NoseKevin  Dover M.D.   On: 01/02/2017 14:52    Cardiac Studies   TTE: 12/31/16  Study Conclusions  - Left ventricle: LVEF is approximately 15 to 20%with diffuse   hypokinesis. The cavity size was normal. Wall thickness was   normal. - Aortic valve: There was mild regurgitation. - Right ventricle: Systolic function was mildly reduced.  Patient Profile     81 y.o. male with PMH of cardiac arrest, multivessel CAD, ICM who presented [facility] [place] [facility] evaluation for possible CABG but was turned  down for surgery.    Assessment & Plan    1. Severe multivessel CAD: films were reviewed by interventional cardiology after turned down for bypass. It was felt that PCI was not an option for PCI. Plan to continue with medical therapy. On BB, ASA, statin and ARB  2. ICM: underwent successful biV ICD placement on 11/30 with Dr. Girard Cooteramintz. See by  Dr. Ladona Ridgelaylor yesterday and cleared for d/c from EP standpoint. On good medical therapy  3. Hypotension: Difficulty titrating medications up, but appears stable. He was able to ambulate without any dizziness or lightheadedness.    4. HL: on statin therapy  Dispo: Deconditioned, plan for HHPT. Suspect he may be ready for discharge today. Will follow up with MD.    Signed, Laverda PageLindsay Okema Rollinson, NP  01/04/2017, 10:57 AM  Pager # [phone]   For questions or updates, please contact [facility] Please consult [email or web address] for contact info under Cardiology/STEMI.

## 2017-01-05 ENCOUNTER — Telehealth: Payer: Self-pay | Admitting: Interventional Cardiology

## 2017-01-05 DIAGNOSIS — Z8674 Personal history of sudden cardiac arrest: Secondary | ICD-10-CM | POA: Diagnosis not present

## 2017-01-05 DIAGNOSIS — I110 Hypertensive heart disease with heart failure: Secondary | ICD-10-CM | POA: Diagnosis not present

## 2017-01-05 DIAGNOSIS — E785 Hyperlipidemia, unspecified: Secondary | ICD-10-CM | POA: Diagnosis not present

## 2017-01-05 DIAGNOSIS — I5023 Acute on chronic systolic (congestive) heart failure: Secondary | ICD-10-CM | POA: Diagnosis not present

## 2017-01-05 DIAGNOSIS — Z95810 Presence of automatic (implantable) cardiac defibrillator: Secondary | ICD-10-CM | POA: Diagnosis not present

## 2017-01-05 DIAGNOSIS — I25110 Atherosclerotic heart disease of native coronary artery with unstable angina pectoris: Secondary | ICD-10-CM | POA: Diagnosis not present

## 2017-01-05 DIAGNOSIS — I255 Ischemic cardiomyopathy: Secondary | ICD-10-CM | POA: Diagnosis not present

## 2017-01-05 DIAGNOSIS — I214 Non-ST elevation (NSTEMI) myocardial infarction: Secondary | ICD-10-CM | POA: Diagnosis not present

## 2017-01-05 DIAGNOSIS — Z48812 Encounter for surgical aftercare following surgery on the circulatory system: Secondary | ICD-10-CM | POA: Diagnosis not present

## 2017-01-05 DIAGNOSIS — Z794 Long term (current) use of insulin: Secondary | ICD-10-CM | POA: Diagnosis not present

## 2017-01-05 DIAGNOSIS — E119 Type 2 diabetes mellitus without complications: Secondary | ICD-10-CM | POA: Diagnosis not present

## 2017-01-05 NOTE — Telephone Encounter (Signed)
New message   Needs verbal order   Kathlene November from [facility] would like Dr Eldridge Dace to sign physical therapy 3x a week for 3 weeks , then 2x a week for 1 week

## 2017-01-05 NOTE — Telephone Encounter (Signed)
Called and spoke to Griffin for [facility] who is requesting for Dr. Eldridge Dace to sign PT orders for the patient to receive Physical Therapy 3x/wk for 3 weeks, then 2x/wk for 1 week. Discussed with Dr. Eldridge Dace. Patient is going to be following up with Dr. Purvis Sheffield, however, Dr. Eldridge Dace agrees to sign [facility] PT orders. Made Kathlene November aware.

## 2017-01-06 DIAGNOSIS — I110 Hypertensive heart disease with heart failure: Secondary | ICD-10-CM | POA: Diagnosis not present

## 2017-01-06 DIAGNOSIS — Z48812 Encounter for surgical aftercare following surgery on the circulatory system: Secondary | ICD-10-CM | POA: Diagnosis not present

## 2017-01-06 DIAGNOSIS — E119 Type 2 diabetes mellitus without complications: Secondary | ICD-10-CM | POA: Diagnosis not present

## 2017-01-06 DIAGNOSIS — I214 Non-ST elevation (NSTEMI) myocardial infarction: Secondary | ICD-10-CM | POA: Diagnosis not present

## 2017-01-06 DIAGNOSIS — I255 Ischemic cardiomyopathy: Secondary | ICD-10-CM | POA: Diagnosis not present

## 2017-01-06 DIAGNOSIS — I25110 Atherosclerotic heart disease of native coronary artery with unstable angina pectoris: Secondary | ICD-10-CM | POA: Diagnosis not present

## 2017-01-08 DIAGNOSIS — I25110 Atherosclerotic heart disease of native coronary artery with unstable angina pectoris: Secondary | ICD-10-CM | POA: Diagnosis not present

## 2017-01-08 DIAGNOSIS — I214 Non-ST elevation (NSTEMI) myocardial infarction: Secondary | ICD-10-CM | POA: Diagnosis not present

## 2017-01-08 DIAGNOSIS — I255 Ischemic cardiomyopathy: Secondary | ICD-10-CM | POA: Diagnosis not present

## 2017-01-08 DIAGNOSIS — Z48812 Encounter for surgical aftercare following surgery on the circulatory system: Secondary | ICD-10-CM | POA: Diagnosis not present

## 2017-01-08 DIAGNOSIS — I110 Hypertensive heart disease with heart failure: Secondary | ICD-10-CM | POA: Diagnosis not present

## 2017-01-08 DIAGNOSIS — E119 Type 2 diabetes mellitus without complications: Secondary | ICD-10-CM | POA: Diagnosis not present

## 2017-01-08 NOTE — Care Management Important Message (Signed)
Important Message  Patient Details  Name: Omar Williams MRN: [id number] Date of Birth: 09-Sep-1935   Medicare Important Message Given:  Yes    Dorena Bodo 01/08/2017, 12:39 PM

## 2017-01-13 ENCOUNTER — Ambulatory Visit (INDEPENDENT_AMBULATORY_CARE_PROVIDER_SITE_OTHER): Payer: Medicare Other | Admitting: *Deleted

## 2017-01-13 DIAGNOSIS — I255 Ischemic cardiomyopathy: Secondary | ICD-10-CM | POA: Diagnosis not present

## 2017-01-13 LAB — CUP PACEART INCLINIC DEVICE CHECK
Brady Statistic AP VP Percent: 3.47 %
Brady Statistic AP VS Percent: 0.04 %
Brady Statistic AS VP Percent: 95.14 %
Brady Statistic AS VS Percent: 1.35 %
Brady Statistic RV Percent Paced: 48.03 %
HIGH POWER IMPEDANCE MEASURED VALUE: 84 Ohm
Implantable Lead Implant Date: 20181130
Implantable Lead Model: 4598
Implantable Lead Model: 5076
Implantable Lead Model: 6935
Lead Channel Impedance Value: 180 Ohm
Lead Channel Impedance Value: 180 Ohm
Lead Channel Impedance Value: 190 Ohm
Lead Channel Impedance Value: 380 Ohm
Lead Channel Impedance Value: 380 Ohm
Lead Channel Impedance Value: 513 Ohm
Lead Channel Impedance Value: 570 Ohm
Lead Channel Impedance Value: 589 Ohm
Lead Channel Impedance Value: 589 Ohm
Lead Channel Impedance Value: 627 Ohm
Lead Channel Impedance Value: 646 Ohm
Lead Channel Pacing Threshold Amplitude: 0.75 V
Lead Channel Pacing Threshold Amplitude: 0.75 V
Lead Channel Pacing Threshold Amplitude: 0.75 V
Lead Channel Pacing Threshold Pulse Width: 0.4 ms
Lead Channel Pacing Threshold Pulse Width: 0.4 ms
Lead Channel Sensing Intrinsic Amplitude: 3.375 mV
Lead Channel Sensing Intrinsic Amplitude: 8 mV
Lead Channel Setting Pacing Amplitude: 2.5 V
Lead Channel Setting Pacing Amplitude: 3.5 V
Lead Channel Setting Pacing Amplitude: 3.5 V
Lead Channel Setting Pacing Pulse Width: 0.4 ms
Lead Channel Setting Pacing Pulse Width: 0.4 ms
MDC IDC LEAD IMPLANT DT: 20181130
MDC IDC LEAD IMPLANT DT: 20181130
MDC IDC LEAD LOCATION: 753858
MDC IDC LEAD LOCATION: 753859
MDC IDC LEAD LOCATION: 753860
MDC IDC MSMT BATTERY REMAINING LONGEVITY: 107 mo
MDC IDC MSMT BATTERY VOLTAGE: 3.11 V
MDC IDC MSMT LEADCHNL LV IMPEDANCE VALUE: 180 Ohm
MDC IDC MSMT LEADCHNL LV IMPEDANCE VALUE: 190 Ohm
MDC IDC MSMT LEADCHNL LV IMPEDANCE VALUE: 342 Ohm
MDC IDC MSMT LEADCHNL LV IMPEDANCE VALUE: 380 Ohm
MDC IDC MSMT LEADCHNL LV IMPEDANCE VALUE: 437 Ohm
MDC IDC MSMT LEADCHNL LV IMPEDANCE VALUE: 627 Ohm
MDC IDC MSMT LEADCHNL RA IMPEDANCE VALUE: 380 Ohm
MDC IDC MSMT LEADCHNL RA PACING THRESHOLD PULSEWIDTH: 0.4 ms
MDC IDC PG IMPLANT DT: 20181130
MDC IDC SESS DTM: 20181212173408
MDC IDC SET LEADCHNL RV SENSING SENSITIVITY: 0.3 mV
MDC IDC STAT BRADY RA PERCENT PACED: 3.5 %

## 2017-01-13 NOTE — Patient Instructions (Signed)
DASH Eating Plan DASH stands for "Dietary Approaches to Stop Hypertension." The DASH eating plan is a healthy eating plan that has been shown to reduce high blood pressure (hypertension). It may also reduce your risk for type 2 diabetes, heart disease, and stroke. The DASH eating plan may also help with weight loss. What are tips for following this plan? General guidelines  Avoid eating more than 2,000 mg (milligrams) of salt (sodium) a day. If you have hypertension, you may need to reduce your sodium intake to 1,500 mg a day.  Limit alcohol intake to no more than 1 drink a day for nonpregnant women and 2 drinks a day for men. One drink equals 12 oz of beer, 5 oz of wine, or 1 oz of hard liquor.  Work with your health care provider to maintain a healthy body weight or to lose weight. Ask what an ideal weight is for you.  Get at least 30 minutes of exercise that causes your heart to beat faster (aerobic exercise) most days of the week. Activities may include walking, swimming, or biking.  Work with your health care provider or diet and nutrition specialist (dietitian) to adjust your eating plan to your individual calorie needs. Reading food labels  Check food labels for the amount of sodium per serving. Choose foods with less than 5 percent of the Daily Value of sodium. Generally, foods with less than 300 mg of sodium per serving fit into this eating plan.  To find whole grains, look for the word "whole" as the first word in the ingredient list. Shopping  Buy products labeled as "low-sodium" or "no salt added."  Buy fresh foods. Avoid canned foods and premade or frozen meals. Cooking  Avoid adding salt when cooking. Use salt-free seasonings or herbs instead of table salt or sea salt. Check with your health care provider or pharmacist before using salt substitutes.  Do not fry foods. Cook foods using healthy methods such as baking, boiling, grilling, and broiling instead.  Cook with  heart-healthy oils, such as olive, canola, soybean, or sunflower oil. Meal planning   Eat a balanced diet that includes: ? 5 or more servings of fruits and vegetables each day. At each meal, try to fill half of your plate with fruits and vegetables. ? Up to 6-8 servings of whole grains each day. ? Less than 6 oz of lean meat, poultry, or fish each day. A 3-oz serving of meat is about the same size as a deck of cards. One egg equals 1 oz. ? 2 servings of low-fat dairy each day. ? A serving of nuts, seeds, or beans 5 times each week. ? Heart-healthy fats. Healthy fats called Omega-3 fatty acids are found in foods such as flaxseeds and coldwater fish, like sardines, salmon, and mackerel.  Limit how much you eat of the following: ? Canned or prepackaged foods. ? Food that is high in trans fat, such as fried foods. ? Food that is high in saturated fat, such as fatty meat. ? Sweets, desserts, sugary drinks, and other foods with added sugar. ? Full-fat dairy products.  Do not salt foods before eating.  Try to eat at least 2 vegetarian meals each week.  Eat more home-cooked food and less restaurant, buffet, and fast food.  When eating at a restaurant, ask that your food be prepared with less salt or no salt, if possible. What foods are recommended? The items listed may not be a complete list. Talk with your dietitian about what  dietary choices are best for you. Grains Whole-grain or whole-wheat bread. Whole-grain or whole-wheat pasta. Brown rice. Orpah Cobb. Bulgur. Whole-grain and low-sodium cereals. Pita bread. Low-fat, low-sodium crackers. Whole-wheat flour tortillas. Vegetables Fresh or frozen vegetables (raw, steamed, roasted, or grilled). Low-sodium or reduced-sodium tomato and vegetable juice. Low-sodium or reduced-sodium tomato sauce and tomato paste. Low-sodium or reduced-sodium canned vegetables. Fruits All fresh, dried, or frozen fruit. Canned fruit in natural juice (without  added sugar). Meat and other protein foods Skinless chicken or [place]. Ground chicken or [place]. Pork with fat trimmed off. Fish and seafood. Egg whites. Dried beans, peas, or lentils. Unsalted nuts, nut butters, and seeds. Unsalted canned beans. Lean cuts of beef with fat trimmed off. Low-sodium, lean deli meat. Dairy Low-fat (1%) or fat-free (skim) milk. Fat-free, low-fat, or reduced-fat cheeses. Nonfat, low-sodium ricotta or cottage cheese. Low-fat or nonfat yogurt. Low-fat, low-sodium cheese. Fats and oils Soft margarine without trans fats. Vegetable oil. Low-fat, reduced-fat, or light mayonnaise and salad dressings (reduced-sodium). Canola, safflower, olive, soybean, and sunflower oils. Avocado. Seasoning and other foods Herbs. Spices. Seasoning mixes without salt. Unsalted popcorn and pretzels. Fat-free sweets. What foods are not recommended? The items listed may not be a complete list. Talk with your dietitian about what dietary choices are best for you. Grains Baked goods made with fat, such as croissants, muffins, or some breads. Dry pasta or rice meal packs. Vegetables Creamed or fried vegetables. Vegetables in a cheese sauce. Regular canned vegetables (not low-sodium or reduced-sodium). Regular canned tomato sauce and paste (not low-sodium or reduced-sodium). Regular tomato and vegetable juice (not low-sodium or reduced-sodium). Rosita Fire. Olives. Fruits Canned fruit in a light or heavy syrup. Fried fruit. Fruit in cream or butter sauce. Meat and other protein foods Fatty cuts of meat. Ribs. Fried meat. Tomasa Blase. Sausage. Bologna and other processed lunch meats. Salami. Fatback. Hotdogs. Bratwurst. Salted nuts and seeds. Canned beans with added salt. Canned or smoked fish. Whole eggs or egg yolks. Chicken or [place] with skin. Dairy Whole or 2% milk, cream, and half-and-half. Whole or full-fat cream cheese. Whole-fat or sweetened yogurt. Full-fat cheese. Nondairy creamers. Whipped toppings.  Processed cheese and cheese spreads. Fats and oils Butter. Stick margarine. Lard. Shortening. Ghee. Bacon fat. Tropical oils, such as coconut, palm kernel, or palm oil. Seasoning and other foods Salted popcorn and pretzels. Onion salt, garlic salt, seasoned salt, table salt, and sea salt. Worcestershire sauce. Tartar sauce. Barbecue sauce. Teriyaki sauce. Soy sauce, including reduced-sodium. Steak sauce. Canned and packaged gravies. Fish sauce. Oyster sauce. Cocktail sauce. Horseradish that you find on the shelf. Ketchup. Mustard. Meat flavorings and tenderizers. Bouillon cubes. Hot sauce and Tabasco sauce. Premade or packaged marinades. Premade or packaged taco seasonings. Relishes. Regular salad dressings. Where to find more information:  National Heart, Lung, and Blood Institute: PopSteam.is  American Heart Association: www.heart.org Summary  The DASH eating plan is a healthy eating plan that has been shown to reduce high blood pressure (hypertension). It may also reduce your risk for type 2 diabetes, heart disease, and stroke.  With the DASH eating plan, you should limit salt (sodium) intake to 2,000 mg a day. If you have hypertension, you may need to reduce your sodium intake to 1,500 mg a day.  When on the DASH eating plan, aim to eat more fresh fruits and vegetables, whole grains, lean proteins, low-fat dairy, and heart-healthy fats.  Work with your health care provider or diet and nutrition specialist (dietitian) to adjust your eating plan to your individual  calorie needs. This information is not intended to replace advice given to you by your health care provider. Make sure you discuss any questions you have with your health care provider. Document Released: 01/08/2011 Document Revised: 01/13/2016 Document Reviewed: 01/13/2016 Elsevier Interactive Patient Education  2017 Elsevier Inc.  

## 2017-01-13 NOTE — Progress Notes (Signed)
Wound check appointment. Steri-strips removed. Wound without redness or edema. Incision edges approximated, wound well healed. Normal device function. Thresholds, sensing, and impedances consistent with implant measurements. Device programmed at 3.5V for extra safety margin until 3 month visit. Histogram distribution appropriate for patient and level of activity. No mode switches or ventricular arrhythmias noted. Patient educated about wound care, arm mobility, lifting restrictions, shock plan. ROV with JA/E 04/02/17.

## 2017-01-14 DIAGNOSIS — I255 Ischemic cardiomyopathy: Secondary | ICD-10-CM | POA: Diagnosis not present

## 2017-01-14 DIAGNOSIS — I214 Non-ST elevation (NSTEMI) myocardial infarction: Secondary | ICD-10-CM | POA: Diagnosis not present

## 2017-01-14 DIAGNOSIS — Z48812 Encounter for surgical aftercare following surgery on the circulatory system: Secondary | ICD-10-CM | POA: Diagnosis not present

## 2017-01-14 DIAGNOSIS — I110 Hypertensive heart disease with heart failure: Secondary | ICD-10-CM | POA: Diagnosis not present

## 2017-01-14 DIAGNOSIS — I25110 Atherosclerotic heart disease of native coronary artery with unstable angina pectoris: Secondary | ICD-10-CM | POA: Diagnosis not present

## 2017-01-14 DIAGNOSIS — E119 Type 2 diabetes mellitus without complications: Secondary | ICD-10-CM | POA: Diagnosis not present

## 2017-01-15 DIAGNOSIS — I25110 Atherosclerotic heart disease of native coronary artery with unstable angina pectoris: Secondary | ICD-10-CM | POA: Diagnosis not present

## 2017-01-15 DIAGNOSIS — I255 Ischemic cardiomyopathy: Secondary | ICD-10-CM | POA: Diagnosis not present

## 2017-01-15 DIAGNOSIS — E119 Type 2 diabetes mellitus without complications: Secondary | ICD-10-CM | POA: Diagnosis not present

## 2017-01-15 DIAGNOSIS — I214 Non-ST elevation (NSTEMI) myocardial infarction: Secondary | ICD-10-CM | POA: Diagnosis not present

## 2017-01-15 DIAGNOSIS — Z48812 Encounter for surgical aftercare following surgery on the circulatory system: Secondary | ICD-10-CM | POA: Diagnosis not present

## 2017-01-15 DIAGNOSIS — I110 Hypertensive heart disease with heart failure: Secondary | ICD-10-CM | POA: Diagnosis not present

## 2017-01-18 DIAGNOSIS — I214 Non-ST elevation (NSTEMI) myocardial infarction: Secondary | ICD-10-CM | POA: Diagnosis not present

## 2017-01-18 DIAGNOSIS — I110 Hypertensive heart disease with heart failure: Secondary | ICD-10-CM | POA: Diagnosis not present

## 2017-01-18 DIAGNOSIS — Z48812 Encounter for surgical aftercare following surgery on the circulatory system: Secondary | ICD-10-CM | POA: Diagnosis not present

## 2017-01-18 DIAGNOSIS — I255 Ischemic cardiomyopathy: Secondary | ICD-10-CM | POA: Diagnosis not present

## 2017-01-18 DIAGNOSIS — I25110 Atherosclerotic heart disease of native coronary artery with unstable angina pectoris: Secondary | ICD-10-CM | POA: Diagnosis not present

## 2017-01-18 DIAGNOSIS — E119 Type 2 diabetes mellitus without complications: Secondary | ICD-10-CM | POA: Diagnosis not present

## 2017-01-18 NOTE — Progress Notes (Signed)
Cardiology Office Note    Date:  01/19/2017   ID:  Omar Williams, DOB 1935-05-30, MRN [id number]  PCP:  Calla Kicks, MD  Cardiologist: Dr. Purvis Sheffield   Chief Complaint  Patient presents with  . Hospitalization Follow-up    History of Present Illness:    Omar Williams is a 81 y.o. male with past medical history of HTN, HLD, Type 2 DM and recent cardiac arrest who presents to the office today for hospital follow-up.   He was recently admitted to [facility] from 11/28 - 01/04/2017 for evaluation of CAD. He had recently been on vacation in [place] and experienced a cardiac arrest (required CPR/ACLS for 10+ minutes per wife's report) with catheterization showing multivessel CAD. He arrived to [facility] on 12/30/2016 via a private vehicle for further evaluation due to wanting to be closer to home prior to undergoing anticipated CABG. His cath films were reviewed by Cardiology and CT Surgery which showed a tight proximal LAD stenosis with the remainder of the vessel being small in caliber and the LCx with mild proximal narrowing and the RCA with no significant stenosis. CT Surgery felt his LAD was not graftable due to it being small in caliber and diffusely diseased to the apex. The interventional team reviewed his films as well and no good targets for PCI were identified. Echo showed a significantly reduced EF of 15-20%. Due to his recent cardiac arrest and reduced EF, EP was consulted for consideration of ICD placement. On 01/01/2017, he underwent placement of a Medtronic biventricular ICD. No immediate complications were noted. He was started on ASA 81mg  daily, Atorvastatin 80mg  daily, Coreg 6.25mg  BID, and Losartan 50mg  BID with plans to add Spironolactone as an outpatient if BP allows.   In talking with the patient today, he reports significant improvement in his symptoms since recent hospital discharge.  He is well aware of how lucky he is to have survived a cardiac arrest without any  significant neurological deficits. He denies any exertional chest discomfort but is having mild sternal pain which has been present ever since his hospitalization in [place] when he required CPR. He denies any recent dyspnea on exertion, orthopnea, PND, or lower extremity edema.  He has been following blood pressure closely at home and systolic readings are usually in the low 100's-110's with diastolics in the 60's-70's. He reports good compliance with his medication regimen and denies missing any recent doses. No complications regarding his ICD site.  He has been working with PT 3 times per week and is no longer having to use a walker for ambulation.    Past Medical History:  Diagnosis Date  . Acid reflux   . Cardiac arrest (HCC)    a. occuring in 12/2016. LAD disease but no targets for CABG or PCI. Underwent ICD placement as EF 15-20%.   . Coronary artery disease   . Diabetes mellitus without complication (HCC)   . Hypertension   . Renal disorder     Past Surgical History:  Procedure Laterality Date  . BIV ICD INSERTION CRT-D N/A 01/01/2017   Procedure: BIV ICD INSERTION CRT-D;  Surgeon: Regan Lemming, MD;  Location: [facility] INVASIVE CV LAB;  Service: Cardiovascular;  Laterality: N/A;    Current Medications: Outpatient Medications Prior to Visit  Medication Sig Dispense Refill  . aspirin EC 81 MG tablet Take 81 mg by mouth daily.    . finasteride (PROSCAR) 5 MG tablet Take 5 mg by mouth at bedtime.     Marland Kitchen  glimepiride (AMARYL) 4 MG tablet Take 4 mg by mouth 2 (two) times daily.     Marland Kitchen. LANTUS SOLOSTAR 100 UNIT/ML Solostar Pen Inject 12 Units into the skin at bedtime.  1  . nitroGLYCERIN (NITROSTAT) 0.4 MG SL tablet Place 1 tablet (0.4 mg total) under the tongue every 5 (five) minutes x 3 doses as needed for chest pain. 25 tablet 2  . oxyCODONE-acetaminophen (PERCOCET/ROXICET) 5-325 MG per tablet Take 1-2 tablets by mouth every 4 (four) hours as needed for severe pain. 20 tablet 0    . pantoprazole (PROTONIX) 40 MG tablet Take 40 mg by mouth daily.    Marland Kitchen. atorvastatin (LIPITOR) 80 MG tablet Take 80 mg by mouth at bedtime.     . carvedilol (COREG) 6.25 MG tablet Take 6.25 mg by mouth 2 (two) times daily.    Marland Kitchen. losartan (COZAAR) 50 MG tablet Take 1 tablet (50 mg total) by mouth 2 (two) times daily. 30 tablet 2   No facility-administered medications prior to visit.      Allergies:   Patient has no known allergies.   Social History   Socioeconomic History  . Marital status: Married    Spouse name: None  . Number of children: None  . Years of education: None  . Highest education level: None  Social Needs  . Financial resource strain: None  . Food insecurity - worry: None  . Food insecurity - inability: None  . Transportation needs - medical: None  . Transportation needs - non-medical: None  Occupational History  . None  Tobacco Use  . Smoking status: Never Smoker  . Smokeless tobacco: Never Used  Substance and Sexual Activity  . Alcohol use: No  . Drug use: No  . Sexual activity: No  Other Topics Concern  . None  Social History Narrative  . None     Family History:  The patient's family history includes CAD in his brother, brother, and sister; Diabetes in his brother, brother, and sister.   Review of Systems:   Please see the history of present illness.     General:  No chills, fever, night sweats or weight changes.  Cardiovascular:  No chest pain, dyspnea on exertion, edema, orthopnea, palpitations, paroxysmal nocturnal dyspnea. Dermatological: No rash, lesions/masses Respiratory: No cough, dyspnea Urologic: No hematuria, dysuria Abdominal:   No nausea, vomiting, diarrhea, bright red blood per rectum, melena, or hematemesis Neurologic:  No visual changes, changes in mental status. Positive for weakness.   All other systems reviewed and are otherwise negative except as noted above.   Physical Exam:    VS:  BP 108/64   Pulse 65   Ht 5\' 9"   (1.753 m)   Wt 175 lb (79.4 kg)   SpO2 97%   BMI 25.84 kg/m    General: Well developed, well nourished Caucasian male appearing in no acute distress. Head: Normocephalic, atraumatic, sclera non-icteric, no xanthomas, nares are without discharge.  Neck: No carotid bruits. JVD not elevated.  Lungs: Respirations regular and unlabored, without wheezes or rales.  Heart: Regular rate and rhythm. No S3 or S4.  No murmur, no rubs, or gallops appreciated. ICD site with no erythema or drainage. Tender to palpation along sternal region.  Abdomen: Soft, non-tender, non-distended with normoactive bowel sounds. No hepatomegaly. No rebound/guarding. No obvious abdominal masses. Msk:  Strength and tone appear normal for age. No joint deformities or effusions. Extremities: No clubbing or cyanosis. No lower extremity edema.  Distal pedal pulses are 2+ bilaterally. Neuro:  Alert and oriented X 3. Moves all extremities spontaneously. No focal deficits noted. Psych:  Responds to questions appropriately with a normal affect. Skin: No rashes or lesions noted  Wt Readings from Last 3 Encounters:  01/19/17 175 lb (79.4 kg)  01/04/17 170 lb 9.6 oz (77.4 kg)  01/29/13 190 lb (86.2 kg)     Studies/Labs Reviewed:   EKG:  EKG is ordered today.  The ekg ordered today demonstrates V-pacing, HR 63.  Recent Labs: 01/02/2017: Hemoglobin 12.1; Platelets 273 01/19/2017: BUN 27; Creatinine, Ser 1.27; Potassium 4.4; Sodium 135   Lipid Panel    Component Value Date/Time   CHOL 185 12/31/2016 0353   TRIG 160 (H) 12/31/2016 0353   HDL 32 (L) 12/31/2016 0353   CHOLHDL 5.8 12/31/2016 0353   VLDL 32 12/31/2016 0353   LDLCALC 121 (H) 12/31/2016 0353    Additional studies/ records that were reviewed today include:   Echocardiogram: 12/2016 Study Conclusions  - Left ventricle: LVEF is approximately 15 to 20%with diffuse   hypokinesis. The cavity size was normal. Wall thickness was   normal. - Aortic valve: There  was mild regurgitation. - Right ventricle: Systolic function was mildly reduced.  Assessment:    1. Chronic systolic heart failure (HCC)   2. Ischemic cardiomyopathy   3. Coronary artery disease involving native coronary artery of native heart without angina pectoris   4. Hyperlipidemia LDL goal <70   5. CKD (chronic kidney disease) stage 3, GFR 30-59 ml/min (HCC)   6. Medication management   7. IDDM (insulin dependent diabetes mellitus) (HCC)      Plan:   In order of problems listed above:  1. Chronic Systolic CHF - Recent echocardiogram showed an significantly reduced EF of 15-20%. - He denies any recent dyspnea on exertion, orthopnea, PND, or lower extremity edema. He does not appear volume overloaded on physical examination. - Continue with Coreg 6.25 mg twice daily and Losartan 50 mg BID. Unable to add Spironolactone at this time due to borderline hypotension. Consider repeat echocardiogram in 3 months following medical therapy to reassess EF.   2. Ischemic Cardiomyopathy -underwent placement of a Medtronic biventricular ICD in 12/2016. - followed by EP - Dr. Johney Frame.   3. CAD - recently admitted for a cardiac arrest with cath showing a tight proximal LAD stenosis with the remainder of the vessel being small in caliber and the LCx with mild proximal narrowing and the RCA with no significant stenosis. The LAD was not felt to be graftable due to it being small in caliber and diffusely diseased to the apex. No good targets for PCI were identified.  - he denies any exertional chest pain or dyspnea. Is having pain along his sternum which is reproducible on palpation and likely secondary to CPR.  - continue ASA, statin, and BB therapy.   4. HLD - He was started on Atorvastatin 80 mg daily during his recent admission. Goal LDL is less than 70 with his known CAD.   - Will need repeat FLP and LFTs in 6-8 weeks.   5. Stage 3 CKD - creatinine at 1.2 - 1.3 during recent admission. Will  recheck BMET today to assess kidney function and K+ levels following medication titration.  - previously followed by Nephrology in [place]. Requests for his care to be transferred to [facility].   6. IDDM - has been experiencing episodes of AM Hypoglycemia. Was only receiving 4 units of Lantus during hospitalization and went back to his PTA dosing of 12  units nightly. Recommended he use 6 units over the next several nights and titrate up as needed.  - have referred to a local PCP her the patient's request as he is unable to drive to [facility] as he was previously doing prior to his hospitalization.    Medication Adjustments/Labs and Tests Ordered: Current medicines are reviewed at length with the patient today.  Concerns regarding medicines are outlined above.  Medication changes, Labs and Tests ordered today are listed in the Patient Instructions below. Patient Instructions  Medication Instructions:  I HAVE REFILLED CARDIAC MEDICATIONS, [facility] [place]   Labwork: TODAY  BMET   Testing/Procedures: NONE  Follow-Up: Your physician recommends that you schedule a follow-up appointment in: 6 WEEKS WITH DR. Purvis Sheffield   Any Other Special Instructions Will Be Listed Below (If Applicable).  You have been referred to [facility], SOMEONE FROM THERE WILL CONTACT YOU   YOU MAY CONTACT SUE NELSON @  PRIMARY CARE FOR A PRIMARY CARE APPOINTMENT  [phone]  If you need a refill on your cardiac medications before your next appointment, please call your pharmacy.    Signed, Ellsworth Lennox, PA-C  01/19/2017 5:26 PM    [facility] [address] [place], [address] [place], [place]  [address] Phone: [phone]; Fax: [phone]  [address] [place], [place] [address] Phone: [phone]

## 2017-01-19 ENCOUNTER — Ambulatory Visit (INDEPENDENT_AMBULATORY_CARE_PROVIDER_SITE_OTHER): Payer: Medicare Other | Admitting: Student

## 2017-01-19 ENCOUNTER — Encounter: Payer: Self-pay | Admitting: Student

## 2017-01-19 ENCOUNTER — Other Ambulatory Visit (HOSPITAL_COMMUNITY)
Admission: RE | Admit: 2017-01-19 | Discharge: 2017-01-19 | Disposition: A | Discharge status: Home / Self Care | Payer: Medicare Other | Source: Ambulatory Visit | Attending: Student | Admitting: Student

## 2017-01-19 VITALS — BP 108/64 | HR 65 | Ht 69.0 in | Wt 175.0 lb

## 2017-01-19 DIAGNOSIS — I2510 Atherosclerotic heart disease of native coronary artery without angina pectoris: Secondary | ICD-10-CM

## 2017-01-19 DIAGNOSIS — E119 Type 2 diabetes mellitus without complications: Secondary | ICD-10-CM

## 2017-01-19 DIAGNOSIS — I5022 Chronic systolic (congestive) heart failure: Secondary | ICD-10-CM | POA: Diagnosis not present

## 2017-01-19 DIAGNOSIS — N183 Chronic kidney disease, stage 3 unspecified: Secondary | ICD-10-CM

## 2017-01-19 DIAGNOSIS — IMO0001 Reserved for inherently not codable concepts without codable children: Secondary | ICD-10-CM

## 2017-01-19 DIAGNOSIS — Z794 Long term (current) use of insulin: Secondary | ICD-10-CM

## 2017-01-19 DIAGNOSIS — I255 Ischemic cardiomyopathy: Secondary | ICD-10-CM

## 2017-01-19 DIAGNOSIS — E785 Hyperlipidemia, unspecified: Secondary | ICD-10-CM

## 2017-01-19 DIAGNOSIS — I25118 Atherosclerotic heart disease of native coronary artery with other forms of angina pectoris: Secondary | ICD-10-CM | POA: Insufficient documentation

## 2017-01-19 DIAGNOSIS — I130 Hypertensive heart and chronic kidney disease with heart failure and stage 1 through stage 4 chronic kidney disease, or unspecified chronic kidney disease: Secondary | ICD-10-CM | POA: Insufficient documentation

## 2017-01-19 DIAGNOSIS — Z79899 Other long term (current) drug therapy: Secondary | ICD-10-CM | POA: Diagnosis not present

## 2017-01-19 LAB — BASIC METABOLIC PANEL
ANION GAP: 10 (ref 5–15)
BUN: 27 mg/dL — AB (ref 6–20)
CALCIUM: 9.4 mg/dL (ref 8.9–10.3)
CO2: 24 mmol/L (ref 22–32)
Chloride: 101 mmol/L (ref 101–111)
Creatinine, Ser: 1.27 mg/dL — ABNORMAL HIGH (ref 0.61–1.24)
GFR calc Af Amer: >60 mL/min (ref >60)
GFR, EST NON AFRICAN AMERICAN: 52 mL/min — AB (ref >60)
GLUCOSE: 77 mg/dL (ref 65–99)
Potassium: 4.4 mmol/L (ref 3.5–5.1)
SODIUM: 135 mmol/L (ref 135–145)

## 2017-01-19 MED ORDER — ATORVASTATIN CALCIUM 80 MG PO TABS: 80.0000 mg | ORAL_TABLET | Freq: Every day | ORAL | 3 refills | Status: DC

## 2017-01-19 MED ORDER — LOSARTAN POTASSIUM 50 MG PO TABS: 50.0000 mg | ORAL_TABLET | Freq: Two times a day (BID) | ORAL | 3 refills | Status: DC

## 2017-01-19 MED ORDER — CARVEDILOL 6.25 MG PO TABS: 6.2500 mg | ORAL_TABLET | Freq: Two times a day (BID) | ORAL | 3 refills | Status: DC

## 2017-01-19 NOTE — Patient Instructions (Signed)
Medication Instructions:  I HAVE REFILLED CARDIAC MEDICATIONS, S[facility] TO [facility] [place]   Labwork: TODAY  BMET   Testing/Procedures: NONE  Follow-Up: Your physician recommends that you schedule a follow-up appointment in: 6 WEEKS WITH DR. Purvis Sheffield    Any Other Special Instructions Will Be Listed Below (If Applicable).  You have been referred to [facility], SOMEONE FROM THERE WILL CONTACT YOU   YOU MAY CONTACT SUE NELSON @ [place] [facility] FOR A PRIMARY CARE APPOINTMENT  [phone]   If you need a refill on your cardiac medications before your next appointment, please call your pharmacy.

## 2017-01-20 DIAGNOSIS — I110 Hypertensive heart disease with heart failure: Secondary | ICD-10-CM | POA: Diagnosis not present

## 2017-01-20 DIAGNOSIS — I255 Ischemic cardiomyopathy: Secondary | ICD-10-CM | POA: Diagnosis not present

## 2017-01-20 DIAGNOSIS — I25110 Atherosclerotic heart disease of native coronary artery with unstable angina pectoris: Secondary | ICD-10-CM | POA: Diagnosis not present

## 2017-01-20 DIAGNOSIS — E119 Type 2 diabetes mellitus without complications: Secondary | ICD-10-CM | POA: Diagnosis not present

## 2017-01-20 DIAGNOSIS — I214 Non-ST elevation (NSTEMI) myocardial infarction: Secondary | ICD-10-CM | POA: Diagnosis not present

## 2017-01-20 DIAGNOSIS — Z48812 Encounter for surgical aftercare following surgery on the circulatory system: Secondary | ICD-10-CM | POA: Diagnosis not present

## 2017-01-22 DIAGNOSIS — I25110 Atherosclerotic heart disease of native coronary artery with unstable angina pectoris: Secondary | ICD-10-CM | POA: Diagnosis not present

## 2017-01-22 DIAGNOSIS — I214 Non-ST elevation (NSTEMI) myocardial infarction: Secondary | ICD-10-CM | POA: Diagnosis not present

## 2017-01-22 DIAGNOSIS — E119 Type 2 diabetes mellitus without complications: Secondary | ICD-10-CM | POA: Diagnosis not present

## 2017-01-22 DIAGNOSIS — Z48812 Encounter for surgical aftercare following surgery on the circulatory system: Secondary | ICD-10-CM | POA: Diagnosis not present

## 2017-01-22 DIAGNOSIS — I110 Hypertensive heart disease with heart failure: Secondary | ICD-10-CM | POA: Diagnosis not present

## 2017-01-22 DIAGNOSIS — I255 Ischemic cardiomyopathy: Secondary | ICD-10-CM | POA: Diagnosis not present

## 2017-01-25 ENCOUNTER — Emergency Department (HOSPITAL_COMMUNITY)
Admission: EM | Admit: 2017-01-25 | Discharge: 2017-01-25 | Disposition: A | Discharge status: Home / Self Care | Payer: Medicare Other | Attending: Emergency Medicine | Admitting: Emergency Medicine

## 2017-01-25 ENCOUNTER — Encounter (HOSPITAL_COMMUNITY): Payer: Self-pay | Admitting: Emergency Medicine

## 2017-01-25 ENCOUNTER — Emergency Department (HOSPITAL_COMMUNITY): Payer: Medicare Other

## 2017-01-25 ENCOUNTER — Other Ambulatory Visit: Payer: Self-pay

## 2017-01-25 DIAGNOSIS — I2510 Atherosclerotic heart disease of native coronary artery without angina pectoris: Secondary | ICD-10-CM | POA: Insufficient documentation

## 2017-01-25 DIAGNOSIS — N201 Calculus of ureter: Secondary | ICD-10-CM | POA: Diagnosis not present

## 2017-01-25 DIAGNOSIS — Z79899 Other long term (current) drug therapy: Secondary | ICD-10-CM | POA: Diagnosis not present

## 2017-01-25 DIAGNOSIS — E119 Type 2 diabetes mellitus without complications: Secondary | ICD-10-CM | POA: Insufficient documentation

## 2017-01-25 DIAGNOSIS — I110 Hypertensive heart disease with heart failure: Secondary | ICD-10-CM | POA: Diagnosis not present

## 2017-01-25 DIAGNOSIS — R300 Dysuria: Secondary | ICD-10-CM | POA: Diagnosis not present

## 2017-01-25 DIAGNOSIS — I252 Old myocardial infarction: Secondary | ICD-10-CM | POA: Diagnosis not present

## 2017-01-25 DIAGNOSIS — I214 Non-ST elevation (NSTEMI) myocardial infarction: Secondary | ICD-10-CM | POA: Diagnosis not present

## 2017-01-25 DIAGNOSIS — Z794 Long term (current) use of insulin: Secondary | ICD-10-CM | POA: Insufficient documentation

## 2017-01-25 DIAGNOSIS — N23 Unspecified renal colic: Secondary | ICD-10-CM | POA: Diagnosis not present

## 2017-01-25 DIAGNOSIS — Z48812 Encounter for surgical aftercare following surgery on the circulatory system: Secondary | ICD-10-CM | POA: Diagnosis not present

## 2017-01-25 DIAGNOSIS — I255 Ischemic cardiomyopathy: Secondary | ICD-10-CM | POA: Diagnosis not present

## 2017-01-25 DIAGNOSIS — I5022 Chronic systolic (congestive) heart failure: Secondary | ICD-10-CM | POA: Diagnosis not present

## 2017-01-25 DIAGNOSIS — Z7982 Long term (current) use of aspirin: Secondary | ICD-10-CM | POA: Diagnosis not present

## 2017-01-25 DIAGNOSIS — K449 Diaphragmatic hernia without obstruction or gangrene: Secondary | ICD-10-CM | POA: Diagnosis not present

## 2017-01-25 DIAGNOSIS — R1032 Left lower quadrant pain: Secondary | ICD-10-CM | POA: Diagnosis not present

## 2017-01-25 DIAGNOSIS — I25110 Atherosclerotic heart disease of native coronary artery with unstable angina pectoris: Secondary | ICD-10-CM | POA: Diagnosis not present

## 2017-01-25 LAB — DIFFERENTIAL
BASOS PCT: 1 %
BLASTS: 0 %
Band Neutrophils: 0 %
Basophils Absolute: 0.1 10*3/uL (ref 0.0–0.1)
EOS PCT: 5 %
Eosinophils Absolute: 0.6 10*3/uL (ref 0.0–0.7)
LYMPHS PCT: 15 %
Lymphs Abs: 1.8 10*3/uL (ref 0.7–4.0)
MONOS PCT: 13 %
MYELOCYTES: 0 %
Metamyelocytes Relative: 0 %
Monocytes Absolute: 1.5 10*3/uL — ABNORMAL HIGH (ref 0.1–1.0)
NEUTROS PCT: 66 %
NRBC: 0 /100{WBCs}
Neutro Abs: 7.8 10*3/uL — ABNORMAL HIGH (ref 1.7–7.7)
OTHER: 0 %
Promyelocytes Absolute: 0 %

## 2017-01-25 LAB — CBC
HCT: 39.2 % (ref 39.0–52.0)
HEMOGLOBIN: 12.4 g/dL — AB (ref 13.0–17.0)
MCH: 31 pg (ref 26.0–34.0)
MCHC: 31.6 g/dL (ref 30.0–36.0)
MCV: 98 fL (ref 78.0–100.0)
PLATELETS: 205 10*3/uL (ref 150–400)
RBC: 4 MIL/uL — AB (ref 4.22–5.81)
RDW: 15.8 % — ABNORMAL HIGH (ref 11.5–15.5)
WBC: 11.8 10*3/uL — AB (ref 4.0–10.5)

## 2017-01-25 LAB — COMPREHENSIVE METABOLIC PANEL
ALK PHOS: 137 U/L — AB (ref 38–126)
ALT: 34 U/L (ref 17–63)
ANION GAP: 12 (ref 5–15)
AST: 26 U/L (ref 15–41)
Albumin: 2.9 g/dL — ABNORMAL LOW (ref 3.5–5.0)
BUN: 20 mg/dL (ref 6–20)
CALCIUM: 8.9 mg/dL (ref 8.9–10.3)
CHLORIDE: 102 mmol/L (ref 101–111)
CO2: 22 mmol/L (ref 22–32)
Creatinine, Ser: 1.25 mg/dL — ABNORMAL HIGH (ref 0.61–1.24)
GFR, EST NON AFRICAN AMERICAN: 53 mL/min — AB (ref >60)
Glucose, Bld: 58 mg/dL — ABNORMAL LOW (ref 65–99)
Potassium: 3.7 mmol/L (ref 3.5–5.1)
SODIUM: 136 mmol/L (ref 135–145)
Total Bilirubin: 1.1 mg/dL (ref 0.3–1.2)
Total Protein: 7.4 g/dL (ref 6.5–8.1)

## 2017-01-25 LAB — URINALYSIS, ROUTINE W REFLEX MICROSCOPIC
BILIRUBIN URINE: NEGATIVE
GLUCOSE, UA: NEGATIVE mg/dL
KETONES UR: NEGATIVE mg/dL
Nitrite: NEGATIVE
PH: 5 (ref 5.0–8.0)
Protein, ur: 100 mg/dL — AB
SPECIFIC GRAVITY, URINE: 1.013 (ref 1.005–1.030)

## 2017-01-25 LAB — LIPASE, BLOOD: Lipase: 41 U/L (ref 11–51)

## 2017-01-25 MED ORDER — ONDANSETRON 4 MG PO TBDP: 4.0000 mg | ORAL_TABLET | Freq: Three times a day (TID) | ORAL | 0 refills | Status: DC | PRN

## 2017-01-25 MED ORDER — IOPAMIDOL (ISOVUE-300) INJECTION 61%
100.0000 mL | Freq: Once | INTRAVENOUS | Status: AC | PRN
  Administered 2017-01-25: 100 mL via INTRAVENOUS

## 2017-01-25 MED ORDER — ONDANSETRON HCL 4 MG/2ML IJ SOLN
4.0000 mg | INTRAMUSCULAR | Status: DC | PRN
  Administered 2017-01-25: 4 mg via INTRAVENOUS
  Filled 2017-01-25: qty 2

## 2017-01-25 MED ORDER — CEPHALEXIN 500 MG PO CAPS: 500.0000 mg | ORAL_CAPSULE | Freq: Four times a day (QID) | ORAL | 0 refills | Status: DC

## 2017-01-25 MED ORDER — TAMSULOSIN HCL 0.4 MG PO CAPS: 0.4000 mg | ORAL_CAPSULE | Freq: Every day | ORAL | 0 refills | Status: DC

## 2017-01-25 MED ORDER — HYDROCODONE-ACETAMINOPHEN 5-325 MG PO TABS: ORAL_TABLET | ORAL | 0 refills | Status: DC

## 2017-01-25 MED ORDER — DEXTROSE 5 % IV SOLN
1.0000 g | Freq: Once | INTRAVENOUS | Status: AC
  Administered 2017-01-25: 1 g via INTRAVENOUS
  Filled 2017-01-25: qty 10

## 2017-01-25 MED ORDER — MORPHINE SULFATE (PF) 4 MG/ML IV SOLN
4.0000 mg | INTRAVENOUS | Status: DC | PRN
  Administered 2017-01-25: 4 mg via INTRAVENOUS
  Filled 2017-01-25: qty 1

## 2017-01-25 NOTE — ED Notes (Signed)
ED Provider at bedside. 

## 2017-01-25 NOTE — Discharge Instructions (Signed)
Take the prescriptions as directed.  Call the Urologist on Wednesday to schedule a follow up appointment this week.  Return to the Emergency Department immediately if worsening.

## 2017-01-25 NOTE — ED Triage Notes (Signed)
Pt reports intermittent LLQ pain, dysuria,chills for last several days. Pt denies pain at this time. Pt reports was recently admitted and pacemaker inserted.

## 2017-01-25 NOTE — ED Provider Notes (Signed)
[facility] EMERGENCY DEPARTMENT Provider Note   CSN: [id number] Arrival date & time: 01/25/17  5790     History   Chief Complaint Chief Complaint  Patient presents with  . Abdominal Pain    HPI Omar Williams is a 81 y.o. male.   Abdominal Pain      Pt was seen at 0820.  Per pt, c/o gradual onset and persistence of constant LLQ abd "pain" since yesterday.  Has been associated with multiple intermittent episodes of N/V and dysuria.  Describes the abd pain as "maybe like when I had a kidney stone."  Denies diarrhea, no fevers, no flank pain, no rash, no CP/SOB, no black or blood in stools or emesis, no hematuria, no testicular pain/swelling.      Past Medical History:  Diagnosis Date  . Acid reflux   . Cardiac arrest (HCC)    a. occuring in 12/2016. LAD disease but no targets for CABG or PCI. Underwent ICD placement as EF 15-20%.   . Coronary artery disease   . Diabetes mellitus without complication (HCC)   . Hypertension   . Renal disorder     Patient Active Problem List   Diagnosis Date Noted  . CAD (coronary artery disease) 01/19/2017  . Ischemic cardiomyopathy 01/04/2017  . Hyperlipidemia LDL goal <70 01/04/2017  . NSTEMI (non-ST elevated myocardial infarction) (HCC)   . Acute on chronic systolic CHF (congestive heart failure) (HCC)   . Unstable angina (HCC) 12/30/2016    Past Surgical History:  Procedure Laterality Date  . BIV ICD INSERTION CRT-D N/A 01/01/2017   Procedure: BIV ICD INSERTION CRT-D;  Surgeon: Regan Lemming, MD;  Location: [facility] INVASIVE CV LAB;  Service: Cardiovascular;  Laterality: N/A;       Home Medications    Prior to Admission medications   Medication Sig Start Date End Date Taking? Authorizing Provider  aspirin EC 81 MG tablet Take 81 mg by mouth daily.    [provider]  atorvastatin (LIPITOR) 80 MG tablet Take 1 tablet (80 mg total) by mouth at bedtime. 01/19/17   Strader, Lennart Pall, PA-C  carvedilol (COREG) 6.25 MG  tablet Take 1 tablet (6.25 mg total) by mouth 2 (two) times daily. 01/19/17   Strader, Lennart Pall, PA-C  finasteride (PROSCAR) 5 MG tablet Take 5 mg by mouth at bedtime.     [provider]  glimepiride (AMARYL) 4 MG tablet Take 4 mg by mouth 2 (two) times daily.     [provider]  LANTUS SOLOSTAR 100 UNIT/ML Solostar Pen Inject 12 Units into the skin at bedtime. 09/23/16   [provider]  losartan (COZAAR) 50 MG tablet Take 1 tablet (50 mg total) by mouth 2 (two) times daily. 01/19/17   Strader, Lennart Pall, PA-C  nitroGLYCERIN (NITROSTAT) 0.4 MG SL tablet Place 1 tablet (0.4 mg total) under the tongue every 5 (five) minutes x 3 doses as needed for chest pain. 01/04/17   Arty Baumgartner, NP  oxyCODONE-acetaminophen (PERCOCET/ROXICET) 5-325 MG per tablet Take 1-2 tablets by mouth every 4 (four) hours as needed for severe pain. 01/29/13   Raeford Razor, MD  pantoprazole (PROTONIX) 40 MG tablet Take 40 mg by mouth daily.    [provider]    Family History Family History  Problem Relation Age of Onset  . CAD Sister   . Diabetes Sister   . CAD Brother   . Diabetes Brother   . CAD Brother   . Diabetes Brother  Social History Social History   Tobacco Use  . Smoking status: Never Smoker  . Smokeless tobacco: Never Used  Substance Use Topics  . Alcohol use: No  . Drug use: No     Allergies   Patient has no known allergies.   Review of Systems Review of Systems  Gastrointestinal: Positive for abdominal pain.  ROS: Statement: All systems negative except as marked or noted in the HPI; Constitutional: Negative for fever and chills. ; ; Eyes: Negative for eye pain, redness and discharge. ; ; ENMT: Negative for ear pain, hoarseness, nasal congestion, sinus pressure and sore throat. ; ; Cardiovascular: Negative for chest pain, palpitations, diaphoresis, dyspnea and peripheral edema. ; ; Respiratory: Negative for cough, wheezing and stridor. ; ;  Gastrointestinal: +N/V, abd pain. Negative for diarrhea, blood in stool, hematemesis, jaundice and rectal bleeding. . ; ; Genitourinary: +dysuria. Negative for flank pain and hematuria. ; ; Genital:  No penile drainage or rash, no testicular pain or swelling, no scrotal rash or swelling. ;; Musculoskeletal: Negative for back pain and neck pain. Negative for swelling and trauma.; ; Skin: Negative for pruritus, rash, abrasions, blisters, bruising and skin lesion.; ; Neuro: Negative for headache, lightheadedness and neck stiffness. Negative for weakness, altered level of consciousness, altered mental status, extremity weakness, paresthesias, involuntary movement, seizure and syncope.        Physical Exam Updated Vital Signs BP 123/65 (BP Location: Left Arm)   Pulse 60   Temp 97.8 F (36.6 C) (Oral)   Resp 18   Ht 5\' 9"  (1.753 m)   Wt 78.9 kg (174 lb)   SpO2 96%   BMI 25.70 kg/m    BP 130/67 (BP Location: Right Arm)   Pulse (!) 57   Temp 98.8 F (37.1 C) (Oral)   Resp 20   Ht 5\' 9"  (1.753 m)   Wt 78.9 kg (174 lb)   SpO2 95%   BMI 25.70 kg/m    Physical Exam 0825: Physical examination:  Nursing notes reviewed; Vital signs and O2 SAT reviewed;  Constitutional: Well developed, Well nourished, Well hydrated, In no acute distress; Head:  Normocephalic, atraumatic; Eyes: EOMI, PERRL, No scleral icterus; ENMT: Mouth and pharynx normal, Mucous membranes moist; Neck: Supple, Full range of motion, No lymphadenopathy; Cardiovascular: Regular rate and rhythm, No gallop; Respiratory: Breath sounds clear & equal bilaterally, No wheezes.  Speaking full sentences with ease, Normal respiratory effort/excursion; Chest: Nontender, Movement normal; Abdomen: Soft, +very mild LLQ tenderness to palp. No rebound or guarding. Nondistended, Normal bowel sounds; Genitourinary: No CVA tenderness; Extremities: Pulses normal, No tenderness, No edema, No calf edema or asymmetry.; Neuro: AA&Ox3, Major CN grossly  intact.  Speech clear. No gross focal motor or sensory deficits in extremities.; Skin: Color normal, Warm, Dry.   ED Treatments / Results  Labs (all labs ordered are listed, but only abnormal results are displayed)   EKG  EKG Interpretation None       Radiology   Procedures Procedures (including critical care time)  Medications Ordered in ED Medications  morphine 4 MG/ML injection 4 mg (not administered)  ondansetron (ZOFRAN) injection 4 mg (not administered)     Initial Impression / Assessment and Plan / ED Course  I have reviewed the triage vital signs and the nursing notes.  Pertinent labs & imaging results that were available during my care of the patient were reviewed by me and considered in my medical decision making (see chart for details).  MDM Reviewed: previous chart, nursing  note and vitals Reviewed previous: labs Interpretation: labs and CT scan    Results for orders placed or performed during the hospital encounter of 01/25/17  Comprehensive metabolic panel  Result Value Ref Range   Sodium 136 135 - 145 mmol/L   Potassium 3.7 3.5 - 5.1 mmol/L   Chloride 102 101 - 111 mmol/L   CO2 22 22 - 32 mmol/L   Glucose, Bld 58 (L) 65 - 99 mg/dL   BUN 20 6 - 20 mg/dL   Creatinine, Ser 4.091.25 (H) 0.61 - 1.24 mg/dL   Calcium 8.9 8.9 - 81.110.3 mg/dL   Total Protein 7.4 6.5 - 8.1 g/dL   Albumin 2.9 (L) 3.5 - 5.0 g/dL   AST 26 15 - 41 U/L   ALT 34 17 - 63 U/L   Alkaline Phosphatase 137 (H) 38 - 126 U/L   Total Bilirubin 1.1 0.3 - 1.2 mg/dL   GFR calc non Af Amer 53 (L) >60 mL/min   GFR calc Af Amer >60 >60 mL/min   Anion gap 12 5 - 15  CBC  Result Value Ref Range   WBC 11.8 (H) 4.0 - 10.5 K/uL   RBC 4.00 (L) 4.22 - 5.81 MIL/uL   Hemoglobin 12.4 (L) 13.0 - 17.0 g/dL   HCT 91.439.2 78.239.0 - 95.652.0 %   MCV 98.0 78.0 - 100.0 fL   MCH 31.0 26.0 - 34.0 pg   MCHC 31.6 30.0 - 36.0 g/dL   RDW 21.315.8 (H) 08.611.5 - 57.815.5 %   Platelets 205 150 - 400 K/uL  Urinalysis, Routine w  reflex microscopic  Result Value Ref Range   Color, Urine AMBER (A) YELLOW   APPearance TURBID (A) CLEAR   Specific Gravity, Urine 1.013 1.005 - 1.030   pH 5.0 5.0 - 8.0   Glucose, UA NEGATIVE NEGATIVE mg/dL   Hgb urine dipstick MODERATE (A) NEGATIVE   Bilirubin Urine NEGATIVE NEGATIVE   Ketones, ur NEGATIVE NEGATIVE mg/dL   Protein, ur 469100 (A) NEGATIVE mg/dL   Nitrite NEGATIVE NEGATIVE   Leukocytes, UA LARGE (A) NEGATIVE   RBC / HPF TOO NUMEROUS TO COUNT 0 - 5 RBC/hpf   WBC, UA TOO NUMEROUS TO COUNT 0 - 5 WBC/hpf   Bacteria, UA RARE (A) NONE SEEN   Squamous Epithelial / LPF 0-5 (A) NONE SEEN   WBC Clumps PRESENT    Non Squamous Epithelial 0-5 (A) NONE SEEN  Lipase, blood  Result Value Ref Range   Lipase 41 11 - 51 U/L  Differential  Result Value Ref Range   Neutrophils Relative % 66 %   Lymphocytes Relative 15 %   Monocytes Relative 13 %   Eosinophils Relative 5 %   Basophils Relative 1 %   Band Neutrophils 0 %   Metamyelocytes Relative 0 %   Myelocytes 0 %   Promyelocytes Absolute 0 %   Blasts 0 %   nRBC 0 0 /100 WBC   Other 0 %   Neutro Abs 7.8 (H) 1.7 - 7.7 K/uL   Lymphs Abs 1.8 0.7 - 4.0 K/uL   Monocytes Absolute 1.5 (H) 0.1 - 1.0 K/uL   Eosinophils Absolute 0.6 0.0 - 0.7 K/uL   Basophils Absolute 0.1 0.0 - 0.1 K/uL   WBC Morphology MILD LEFT SHIFT (1-5% METAS, OCC MYELO, OCC BANDS)    Smear Review GIANT PLATELETS SEEN      Ct Abdomen Pelvis W Contrast Result Date: 01/25/2017 CLINICAL DATA:  Left lower quadrant pain.  Dysuria. EXAM: CT  ABDOMEN AND PELVIS WITH CONTRAST TECHNIQUE: Multidetector CT imaging of the abdomen and pelvis was performed using the standard protocol following bolus administration of intravenous contrast. CONTRAST:  ISOVUE-300 IOPAMIDOL (ISOVUE-300) INJECTION 61% COMPARISON:  January 29, 2013 FINDINGS: Lower chest: A left lower lobe nodule is unchanged since 2014. There is a small right effusion with underlying atelectasis. Cardiomegaly  identified. Hepatobiliary: No focal liver abnormality is seen. No gallstones, gallbladder wall thickening, or biliary dilatation. Pancreas: There is a 2.1 cm cyst in the proximal pancreatic body, measuring 1.6 cm in 2014. The pancreas is otherwise normal. Spleen: Normal in size without focal abnormality. Adrenals/Urinary Tract: Adrenal glands are normal. There is mild hydronephrosis on the left. Mild proximal ureteral prominence on the left. A stone in the mid left ureter on series 2, image 45 measures 4 mm. Proximal to the stone, there is urothelial enhancement in the proximal ureter. There is left perinephric stranding. The remainder of the left ureter is normal. Bilateral renal cysts are identified. No hydronephrosis on the right. A hyperdense mass off the upper right kidney measures 14 x 11 mm today versus 12 x 8 mm in 2014. No right ureterectasis or ureteral stone. The bladder is normal. Stomach/Bowel: The stomach and small bowel are normal. The colon is normal in appearance. The appendix is not visualized but there is no secondary evidence of appendicitis. Vascular/Lymphatic: Mild atherosclerotic change in the nonaneurysmal aorta, iliac vessels, and femoral vessels. No adenopathy. Reproductive: Prostate is unremarkable. Other: A right inguinal hernia contains small bowel and fat. There is a fat containing left inguinal hernia. No free air or free fluid. Musculoskeletal: Severe degenerative changes in the hips. No other acute bony abnormalities. IMPRESSION: 1. There is a stone in the mid left ureter resulting in mild left ureterectasis and perinephric stranding. Secondary enhancement of the proximal ureteral urothelium is identified. 2. A high attenuation mass, exophytic off the upper pole of the right kidney, is favored to represent a hyperdense cyst. However, it measures 14 x 11 mm today versus 12 x 8 mm in 2014. Given the small amount of interval enlargement, recommend correlation with an ultrasound in an  attempt to confirm the cystic nature. If ultrasound cannot confirm a cyst, an MRI could be utilized to exclude a slowly growing solid neoplasm which is considered less likely. 3. The mass in the proximal pancreatic body measures 2.1 cm today versus 1.6 cm in 2014. Given greater than 20% growth in a cystic mass remaining less than 2.5 cm in size in a person less than 26 years old, recommend a six-month follow-up. 4. Atherosclerotic changes in the aorta as above. 5. Small right pleural effusion with underlying atelectasis. 6. Bilateral inguinal hernias containing fat bilaterally and a small amount of small bowel on the right without obstruction. Electronically Signed   By: Gerome Sam III M.D   On: 01/25/2017 10:37    1100:  WBC count mildly elevated and with left shift. Pt c/o dysuria, possible UTI + ureteral calculus on CT; will start IV rocephin.  UC is pending. Pt pain free after IV morphine, tol PO without N/V. VS per baseline, remains afebrile. T/C to Uro Dr. Sherron Monday, case discussed, including:  HPI, pertinent PM/SHx, VS/PE, dx testing, ED course and treatment:  States pt does not need admission at this time, rx flomax, pain meds, keflex while UC pending, strict return precautions, otherwise f/u office. Dx and testing d/w pt and family.  Questions answered.  Verb understanding, agreeable to d/c home with  outpt f/u.    Final Clinical Impressions(s) / ED Diagnoses   Final diagnoses:  None    ED Discharge Orders    None        Samuel Jester, DO 01/27/17 6213

## 2017-01-27 ENCOUNTER — Ambulatory Visit: Payer: Medicare Other | Admitting: Cardiovascular Disease

## 2017-01-27 LAB — URINE CULTURE: Culture: >=100000 — AB

## 2017-01-28 ENCOUNTER — Telehealth: Payer: Self-pay | Admitting: *Deleted

## 2017-01-28 DIAGNOSIS — I110 Hypertensive heart disease with heart failure: Secondary | ICD-10-CM | POA: Diagnosis not present

## 2017-01-28 DIAGNOSIS — E119 Type 2 diabetes mellitus without complications: Secondary | ICD-10-CM | POA: Diagnosis not present

## 2017-01-28 DIAGNOSIS — I255 Ischemic cardiomyopathy: Secondary | ICD-10-CM | POA: Diagnosis not present

## 2017-01-28 DIAGNOSIS — Z48812 Encounter for surgical aftercare following surgery on the circulatory system: Secondary | ICD-10-CM | POA: Diagnosis not present

## 2017-01-28 DIAGNOSIS — I25110 Atherosclerotic heart disease of native coronary artery with unstable angina pectoris: Secondary | ICD-10-CM | POA: Diagnosis not present

## 2017-01-28 DIAGNOSIS — I214 Non-ST elevation (NSTEMI) myocardial infarction: Secondary | ICD-10-CM | POA: Diagnosis not present

## 2017-01-28 NOTE — Telephone Encounter (Signed)
Post ED Visit - Positive Culture Follow-up  Culture report reviewed by antimicrobial stewardship pharmacist:  []  Enzo Bi, Pharm.D. [x]  Celedonio Miyamoto, Pharm.D., BCPS AQ-ID []  Garvin Fila, Pharm.D., BCPS []  Georgina Pillion, Pharm.D., BCPS []  [place], [address].D., BCPS, AAHIVP []  Estella Husk, Pharm.D., BCPS, AAHIVP []  Lysle Pearl, PharmD, BCPS []  Casilda Carls, PharmD, BCPS []  Pollyann Samples, PharmD, BCPS  Positive urine culture Treated with Cephalexin, organism sensitive to the same and no further patient follow-up is required at this time.  Virl Axe [facility] 01/28/2017, 9:53 AM

## 2017-02-04 ENCOUNTER — Other Ambulatory Visit (HOSPITAL_COMMUNITY): Payer: Self-pay | Admitting: Urology

## 2017-02-04 DIAGNOSIS — N281 Cyst of kidney, acquired: Secondary | ICD-10-CM | POA: Diagnosis not present

## 2017-02-04 DIAGNOSIS — N201 Calculus of ureter: Secondary | ICD-10-CM

## 2017-02-04 DIAGNOSIS — N181 Chronic kidney disease, stage 1: Secondary | ICD-10-CM | POA: Diagnosis not present

## 2017-02-09 ENCOUNTER — Other Ambulatory Visit: Payer: Self-pay

## 2017-02-09 ENCOUNTER — Ambulatory Visit (INDEPENDENT_AMBULATORY_CARE_PROVIDER_SITE_OTHER): Payer: Medicare Other | Admitting: Family Medicine

## 2017-02-09 ENCOUNTER — Encounter: Payer: Self-pay | Admitting: Family Medicine

## 2017-02-09 VITALS — BP 102/56 | HR 64 | Temp 97.5°F | Resp 18 | Ht 66.5 in | Wt 175.0 lb

## 2017-02-09 DIAGNOSIS — H6123 Impacted cerumen, bilateral: Secondary | ICD-10-CM

## 2017-02-09 DIAGNOSIS — E1122 Type 2 diabetes mellitus with diabetic chronic kidney disease: Secondary | ICD-10-CM | POA: Insufficient documentation

## 2017-02-09 DIAGNOSIS — E785 Hyperlipidemia, unspecified: Secondary | ICD-10-CM | POA: Diagnosis not present

## 2017-02-09 DIAGNOSIS — N401 Enlarged prostate with lower urinary tract symptoms: Secondary | ICD-10-CM | POA: Diagnosis not present

## 2017-02-09 DIAGNOSIS — E119 Type 2 diabetes mellitus without complications: Secondary | ICD-10-CM

## 2017-02-09 DIAGNOSIS — I152 Hypertension secondary to endocrine disorders: Secondary | ICD-10-CM | POA: Insufficient documentation

## 2017-02-09 DIAGNOSIS — N138 Other obstructive and reflux uropathy: Secondary | ICD-10-CM | POA: Insufficient documentation

## 2017-02-09 DIAGNOSIS — E559 Vitamin D deficiency, unspecified: Secondary | ICD-10-CM | POA: Diagnosis not present

## 2017-02-09 DIAGNOSIS — E538 Deficiency of other specified B group vitamins: Secondary | ICD-10-CM | POA: Diagnosis not present

## 2017-02-09 DIAGNOSIS — I10 Essential (primary) hypertension: Secondary | ICD-10-CM | POA: Diagnosis not present

## 2017-02-09 DIAGNOSIS — R351 Nocturia: Secondary | ICD-10-CM | POA: Diagnosis not present

## 2017-02-09 DIAGNOSIS — N183 Chronic kidney disease, stage 3 unspecified: Secondary | ICD-10-CM | POA: Insufficient documentation

## 2017-02-09 MED ORDER — GLIMEPIRIDE 4 MG PO TABS: 4.0000 mg | ORAL_TABLET | Freq: Two times a day (BID) | ORAL | 3 refills | Status: DC

## 2017-02-09 MED ORDER — GLUCOSE BLOOD VI STRP: ORAL_STRIP | 12 refills | Status: DC

## 2017-02-09 MED ORDER — BLOOD GLUCOSE MONITOR KIT: PACK | 0 refills | Status: DC

## 2017-02-09 MED ORDER — VITAMIN D (ERGOCALCIFEROL) 1.25 MG (50000 UNIT) PO CAPS: 50000.0000 [IU] | ORAL_CAPSULE | ORAL | 0 refills | Status: DC

## 2017-02-09 MED ORDER — FINASTERIDE 5 MG PO TABS: 5.0000 mg | ORAL_TABLET | Freq: Every day | ORAL | 3 refills | Status: DC

## 2017-02-09 NOTE — Patient Instructions (Signed)
Need old records Need a PE in 2-3 weks Check sugar two times a day Write down for me to review next time STOP INSULIN Consider Debrox ear drops to soften wax Reduce your fluid intake after 6 pm

## 2017-02-09 NOTE — Progress Notes (Signed)
Chief Complaint  Patient presents with  . Diabetes   This is a new patient to establish.  He is under the care of cardiology.  He would like to establish with this clinic to keep all of his records in the [facility] system Patient has diabetes.  He has had it for about 5 years.  He is been on insulin for the last 3 years.  There is been a lot of adjustment of his insulin dose recently.  He was on 12 units a day.  He reduced it to 6 units a day.  Now he decides on a day-to-day basis whether or not to take insulin.  His blood sugar was 176 last evening, he did not take insulin, and this morning his fasting sugar was 53.  I told him to absolutely stop insulin, will get an A1c today, and decide whether he needs to be on injections.  Further with hypoglycemia we may need to change his medicine to reduce his glimepiride. He has a history of hypertension.  Ever since his recent heart attack he has not needed blood pressure medication.  His blood pressure today is soft.  He is not having orthostatic changes.  No dizziness, no presyncope. Patient has hyperlipidemia.  He is compliant with his statin Patient has been inactive since his hospitalization.  Patient had a cardiac arrest on 12/30/16 requiring CPR.  He was admitted to [facility] where he had catheterization and cardiology evaluation pending CABG.  It was decided not to do stenting or bypass.  He has significant heart failure with ejection fraction of 15-20%.  He had a ICD inserted.  He still feeling quite weak and tired.  He is inactive.  Physical therapy has come to his home and showed him exercises.  He has been discharged home from PT.  He has followed up with cardiology. Patient was seen in the emergency room on January 25, 2017 for flank pain and was found to have a kidney stone.  Patient passed this. Patient lives with his elderly wife.  He has family that checks on him frequently.  He retired just this November when he had a heart attack. He  does have a history of kidney insufficiency.  He has seen nephrology.  They have him on vitamin D 50,000 units once a month. He has a history of GERD.  He has Protonix.  He states that he takes it about once a month. He states he is up-to-date with his eye exam.  He will get me this record.  He thinks his shots are up-to-date.  He states he had a colon cancer screening "home test" a couple months ago.  I will request his old records  Patient Active Problem List   Diagnosis Date Noted  . Type 2 diabetes mellitus with diabetic chronic kidney disease (HCC) 02/09/2017  . Hypertension 02/09/2017  . BPH associated with nocturia 02/09/2017  . Vitamin D deficiency 02/09/2017  . CAD (coronary artery disease) 01/19/2017  . Ischemic cardiomyopathy 01/04/2017  . Hyperlipidemia LDL goal <70 01/04/2017  . NSTEMI (non-ST elevated myocardial infarction) (HCC)   . Acute on chronic systolic CHF (congestive heart failure) (HCC)     Outpatient Encounter Medications as of 02/09/2017  Medication Sig  . aspirin EC 81 MG tablet Take 81 mg by mouth daily.  Marland Kitchen atorvastatin (LIPITOR) 80 MG tablet Take 1 tablet (80 mg total) by mouth at bedtime.  . carvedilol (COREG) 6.25 MG tablet Take 1 tablet (6.25 mg total)  by mouth 2 (two) times daily.  . finasteride (PROSCAR) 5 MG tablet Take 1 tablet (5 mg total) by mouth at bedtime.  Marland Kitchen glimepiride (AMARYL) 4 MG tablet Take 1 tablet (4 mg total) by mouth 2 (two) times daily.  Marland Kitchen losartan (COZAAR) 50 MG tablet Take 1 tablet (50 mg total) by mouth 2 (two) times daily.  . nitroGLYCERIN (NITROSTAT) 0.4 MG SL tablet Place 1 tablet (0.4 mg total) under the tongue every 5 (five) minutes x 3 doses as needed for chest pain.  . pantoprazole (PROTONIX) 40 MG tablet Take 40 mg by mouth daily.  . tamsulosin (FLOMAX) 0.4 MG CAPS capsule Take 1 capsule (0.4 mg total) by mouth at bedtime.  Marland Kitchen glucose blood (COOL BLOOD GLUCOSE TEST STRIPS) test strip Use as instructed   No facility-administered  encounter medications on file as of 02/09/2017.     Past Medical History:  Diagnosis Date  . Acid reflux   . Blood transfusion without reported diagnosis   . Cardiac arrest (HCC)    a. occuring in 12/2016. LAD disease but no targets for CABG or PCI. Underwent ICD placement as EF 15-20%.   . CHF (congestive heart failure) (HCC)   . Coronary artery disease   . Diabetes mellitus without complication (HCC)   . Hyperlipidemia   . Hypertension   . Myocardial infarction (HCC)   . Renal disorder     Past Surgical History:  Procedure Laterality Date  . BIV ICD INSERTION CRT-D N/A 01/01/2017   Procedure: BIV ICD INSERTION CRT-D;  Surgeon: Regan Lemming, MD;  Location: [facility] INVASIVE CV LAB;  Service: Cardiovascular;  Laterality: N/A;    Social History   Socioeconomic History  . Marital status: Married    Spouse name: [name]  . Number of children: 3  . Years of education: 45  . Highest education level: Some college, no degree  Social Needs  . Financial resource strain: Not hard at all  . Food insecurity - worry: Never true  . Food insecurity - inability: Never true  . Transportation needs - medical: No  . Transportation needs - non-medical: No  Occupational History  . Occupation: Health and safety inspector   retired  . Occupation: pastor  Tobacco Use  . Smoking status: Never Smoker  . Smokeless tobacco: Never Used  Substance and Sexual Activity  . Alcohol use: No  . Drug use: No  . Sexual activity: No  Other Topics Concern  . Not on file  Social History Narrative   Lives with with wife [name]   Married 60 years   Daughter [name] stays frequently   Granddaughter [name] near by    [facility] History  Problem Relation Age of Onset  . Hypertension Mother   . Hyperlipidemia Mother   . CAD Sister   . Diabetes Sister   . Hyperlipidemia Sister   . Hypertension Sister   . CAD Brother   . Diabetes Brother   . Hypertension Brother   . Hyperlipidemia Brother   . CAD Brother   .  Diabetes Brother   . Hypertension Brother   . Hyperlipidemia Brother   . Thyroid disease Daughter   . Hypertension Daughter   . Diabetes Daughter   . Hyperlipidemia Daughter   . Fibromyalgia Daughter   . Heart disease Daughter        stents  . Multiple sclerosis Daughter   . Thyroid disease Daughter     Review of Systems  Constitutional: Positive for malaise/fatigue and weight loss.  Negative for chills and fever.  HENT: Positive for hearing loss. Negative for congestion.        Hearing loss both ears.  Wax both ears  Eyes: Negative for blurred vision and pain.  Respiratory: Positive for shortness of breath. Negative for cough.        With exertion  Cardiovascular: Negative for chest pain and leg swelling.  Gastrointestinal: Negative for abdominal pain, constipation, diarrhea and heartburn.       Rare heartburn  Genitourinary: Positive for frequency. Negative for dysuria.  Musculoskeletal: Negative for falls, joint pain and myalgias.  Neurological: Negative for dizziness, seizures and headaches.  Psychiatric/Behavioral: Negative for depression. The patient is not nervous/anxious and does not have insomnia.    BP (!) 102/56 (BP Location: Left Arm, Patient Position: Sitting, Cuff Size: Normal)   Pulse 64   Temp (!) 97.5 F (36.4 C) (Temporal)   Resp 18   Ht 5' 6.5" (1.689 m)   Wt 175 lb (79.4 kg)   SpO2 97%   BMI 27.82 kg/m   Physical Exam  Constitutional: He is oriented to person, place, and time. He appears well-developed and well-nourished.  Small elderly man.  Accompanied by granddaughter.  Pleasant  HENT:  Head: Normocephalic and atraumatic.  Mouth/Throat: Oropharynx is clear and moist.  External ear canals are blocked by cerumen.  Attempt at curettage and flushing x10 unsuccessful.  Eyes: Conjunctivae are normal. Pupils are equal, round, and reactive to light.  Neck: Normal range of motion. Neck supple. No JVD present. No thyromegaly present.  Cardiovascular:  Normal rate, regular rhythm and normal heart sounds.  Occasional ectopy  Pulmonary/Chest: Effort normal and breath sounds normal. No respiratory distress.  Abdominal: Soft. Bowel sounds are normal.  Musculoskeletal: Normal range of motion. He exhibits no edema.  Lymphadenopathy:    He has no cervical adenopathy.  Neurological: He is alert and oriented to person, place, and time.  Gait normal  Skin: Skin is warm and dry.  Psychiatric: He has a normal mood and affect. His behavior is normal. Thought content normal.  Nursing note and vitals reviewed. ASSESSMENT/PLAN:   1. Type 2 diabetes mellitus without complication, without long-term current use of insulin (HCC) I have discontinued his insulin due to hypoglycemia - Vitamin B12 - CBC - COMPLETE METABOLIC PANEL WITH GFR - Hemoglobin A1c - Lipid panel - Urinalysis, Routine w reflex microscopic - Microalbumin / creatinine urine ratio  2. Essential hypertension Controlled, actually low  3. BPH associated with nocturia Treated  4. Vitamin D deficiency Due to renal insufficiency - VITAMIN D 25 Hydroxy (Vit-D Deficiency, Fractures)  5. Hyperlipidemia LDL goal <70 On statin  6. Bilateral hearing loss due to cerumen impaction Attempted ear lavage.  Given instructions to use home eardrops and will re-lavage at next visit - Ear Lavage  7. Vitamin B 12 deficiency By history - Vitamin B12 Greater than 50% of this visit was spent in counseling and coordinating care.  Total face to face time:   60 minutes.  We discussed his diabetes, insulin, diet, exercise, and.  We discussed his heart disease recent heart attack cardiac rehab exercise and dietary recommendations.   Patient Instructions  Need old records Need a PE in 2-3 weks Check sugar two times a day Write down for me to review next time STOP INSULIN Consider Debrox ear drops to soften wax Reduce your fluid intake after 6 pm     Eustace Moore, MD

## 2017-02-10 LAB — COMPLETE METABOLIC PANEL WITH GFR
AG RATIO: 1.2 (calc) (ref 1.0–2.5)
ALBUMIN MSPROF: 4 g/dL (ref 3.6–5.1)
ALT: 23 U/L (ref 9–46)
AST: 21 U/L (ref 10–35)
Alkaline phosphatase (APISO): 113 U/L (ref 40–115)
BILIRUBIN TOTAL: 0.9 mg/dL (ref 0.2–1.2)
BUN / CREAT RATIO: 12 (calc) (ref 6–22)
BUN: 15 mg/dL (ref 7–25)
CALCIUM: 9.6 mg/dL (ref 8.6–10.3)
CHLORIDE: 105 mmol/L (ref 98–110)
CO2: 26 mmol/L (ref 20–32)
Creat: 1.24 mg/dL — ABNORMAL HIGH (ref 0.70–1.18)
GFR, EST AFRICAN AMERICAN: 64 mL/min/{1.73_m2} (ref >=60)
GFR, EST NON AFRICAN AMERICAN: 55 mL/min/{1.73_m2} — AB (ref >=60)
GLOBULIN: 3.4 g/dL (ref 1.9–3.7)
Glucose, Bld: 99 mg/dL (ref 65–139)
POTASSIUM: 4 mmol/L (ref 3.5–5.3)
SODIUM: 139 mmol/L (ref 135–146)
TOTAL PROTEIN: 7.4 g/dL (ref 6.1–8.1)

## 2017-02-10 LAB — MICROALBUMIN / CREATININE URINE RATIO
Creatinine, Urine: 131 mg/dL (ref 20–320)
Microalb Creat Ratio: 183 mcg/mg creat — ABNORMAL HIGH (ref <30)
Microalb, Ur: 24 mg/dL

## 2017-02-10 LAB — LIPID PANEL
Cholesterol: 173 mg/dL (ref <200)
HDL: 42 mg/dL (ref >40)
LDL Cholesterol (Calc): 105 mg/dL (calc) — ABNORMAL HIGH
NON-HDL CHOLESTEROL (CALC): 131 mg/dL — AB (ref <130)
TRIGLYCERIDES: 148 mg/dL (ref <150)
Total CHOL/HDL Ratio: 4.1 (calc) (ref <5.0)

## 2017-02-10 LAB — URINALYSIS, ROUTINE W REFLEX MICROSCOPIC
Bacteria, UA: NONE SEEN /HPF
Bilirubin Urine: NEGATIVE
Glucose, UA: NEGATIVE
Hgb urine dipstick: NEGATIVE
Hyaline Cast: NONE SEEN /LPF
KETONES UR: NEGATIVE
Leukocytes, UA: NEGATIVE
NITRITE: NEGATIVE
SPECIFIC GRAVITY, URINE: 1.017 (ref 1.001–1.03)
SQUAMOUS EPITHELIAL / LPF: NONE SEEN /HPF (ref <=5)

## 2017-02-10 LAB — CBC
HEMATOCRIT: 39 % (ref 38.5–50.0)
Hemoglobin: 13.2 g/dL (ref 13.2–17.1)
MCH: 31.3 pg (ref 27.0–33.0)
MCHC: 33.8 g/dL (ref 32.0–36.0)
MCV: 92.4 fL (ref 80.0–100.0)
MPV: 9.6 fL (ref 7.5–12.5)
PLATELETS: 320 10*3/uL (ref 140–400)
RBC: 4.22 10*6/uL (ref 4.20–5.80)
RDW: 14.7 % (ref 11.0–15.0)
WBC: 7.6 10*3/uL (ref 3.8–10.8)

## 2017-02-10 LAB — HEMOGLOBIN A1C
Hgb A1c MFr Bld: 6.2 % of total Hgb — ABNORMAL HIGH (ref <5.7)
MEAN PLASMA GLUCOSE: 131 (calc)
eAG (mmol/L): 7.3 (calc)

## 2017-02-10 LAB — VITAMIN D 25 HYDROXY (VIT D DEFICIENCY, FRACTURES): Vit D, 25-Hydroxy: 25 ng/mL — ABNORMAL LOW (ref 30–100)

## 2017-02-10 LAB — VITAMIN B12: Vitamin B-12: 661 pg/mL (ref 200–1100)

## 2017-02-23 ENCOUNTER — Ambulatory Visit (INDEPENDENT_AMBULATORY_CARE_PROVIDER_SITE_OTHER): Payer: Medicare Other | Admitting: Family Medicine

## 2017-02-23 ENCOUNTER — Encounter: Payer: Self-pay | Admitting: Family Medicine

## 2017-02-23 ENCOUNTER — Other Ambulatory Visit: Payer: Self-pay

## 2017-02-23 VITALS — BP 110/60 | HR 82 | Temp 97.4°F | Resp 16 | Ht 67.0 in | Wt 174.8 lb

## 2017-02-23 DIAGNOSIS — I10 Essential (primary) hypertension: Secondary | ICD-10-CM

## 2017-02-23 DIAGNOSIS — Z0000 Encounter for general adult medical examination without abnormal findings: Secondary | ICD-10-CM | POA: Diagnosis not present

## 2017-02-23 DIAGNOSIS — E119 Type 2 diabetes mellitus without complications: Secondary | ICD-10-CM | POA: Diagnosis not present

## 2017-02-23 DIAGNOSIS — E785 Hyperlipidemia, unspecified: Secondary | ICD-10-CM | POA: Diagnosis not present

## 2017-02-23 DIAGNOSIS — E559 Vitamin D deficiency, unspecified: Secondary | ICD-10-CM | POA: Diagnosis not present

## 2017-02-23 MED ORDER — GLUCOSE BLOOD VI STRP: ORAL_STRIP | 12 refills | Status: DC

## 2017-02-23 NOTE — Progress Notes (Signed)
Chief Complaint  Patient presents with  . Annual Exam   Patient is here for his annual physical examination. He is up-to-date with immunizations, except for his Tdap. He has had flu shots, pneumonia shots, and shingles shot. Still do not have his old records for review. He has had a colonoscopy in the past. He is doing well after his cardiac arrest and pacemaker placement.  He has completed his physical therapy.  He walks every day to tolerance.  He feels like his strength is getting better. He still has some chest wall pain from the CPR.  I told him this will get better over time. He has diabetes.  Was previously on insulin.  Recently, his medication it has gone down.  He is taking 1 glimepiride a day, and his blood sugars are consistently staying in the 100-175 range.  His last hemoglobin A1c was under 7.  Will check again in 3 months. He is on high-dose statin, Lipitor 80 mg a day.  His LDL is still over 100.  He does watch his diet. His blood pressure is well controlled.  Blood pressure today is 110/60.  He has not had any high or low blood pressures, no orthostatic symptoms.  Patient Active Problem List   Diagnosis Date Noted  . Type 2 diabetes mellitus with diabetic chronic kidney disease ([place]) 02/09/2017  . Hypertension 02/09/2017  . BPH associated with nocturia 02/09/2017  . Vitamin D deficiency 02/09/2017  . CAD (coronary artery disease) 01/19/2017  . Ischemic cardiomyopathy 01/04/2017  . Hyperlipidemia LDL goal <70 01/04/2017  . NSTEMI (non-ST elevated myocardial infarction) ([place])   . Acute on chronic systolic CHF (congestive heart failure) ([place])     Outpatient Encounter Medications as of 02/23/2017  Medication Sig  . aspirin EC 81 MG tablet Take 81 mg by mouth daily.  Marland Kitchen atorvastatin (LIPITOR) 80 MG tablet Take 1 tablet (80 mg total) by mouth at bedtime.  . blood glucose meter kit and supplies KIT Test daily.  Dx e11.22  . carvedilol (COREG) 6.25 MG tablet Take 1  tablet (6.25 mg total) by mouth 2 (two) times daily.  . finasteride (PROSCAR) 5 MG tablet Take 1 tablet (5 mg total) by mouth at bedtime.  Marland Kitchen glimepiride (AMARYL) 4 MG tablet Take 1 tablet (4 mg total) by mouth 2 (two) times daily.  Marland Kitchen glucose blood (COOL BLOOD GLUCOSE TEST STRIPS) test strip Use as instructed  . losartan (COZAAR) 50 MG tablet Take 1 tablet (50 mg total) by mouth 2 (two) times daily.  . nitroGLYCERIN (NITROSTAT) 0.4 MG SL tablet Place 1 tablet (0.4 mg total) under the tongue every 5 (five) minutes x 3 doses as needed for chest pain.  . tamsulosin (FLOMAX) 0.4 MG CAPS capsule Take 1 capsule (0.4 mg total) by mouth at bedtime.  . Vitamin D, Ergocalciferol, (DRISDOL) 50000 units CAPS capsule Take 1 capsule (50,000 Units total) by mouth every 30 (thirty) days.  . [DISCONTINUED] glucose blood (COOL BLOOD GLUCOSE TEST STRIPS) test strip Use as instructed  . [DISCONTINUED] pantoprazole (PROTONIX) 40 MG tablet Take 40 mg by mouth daily.   No facility-administered encounter medications on file as of 02/23/2017.     No Known Allergies  Review of Systems  Constitutional: Positive for fatigue. Negative for activity change, appetite change and unexpected weight change.  HENT: Negative for congestion and dental problem.   Eyes: Negative for photophobia and visual disturbance.       Normal eye exam 6 months ago  Respiratory: Negative for cough, chest tightness and shortness of breath.   Cardiovascular: Positive for chest pain. Negative for palpitations and leg swelling.       Chest wall pain  Gastrointestinal: Negative for blood in stool, constipation and diarrhea.  Genitourinary: Negative for difficulty urinating, dysuria and frequency.  Musculoskeletal: Negative for arthralgias and back pain.  Skin: Negative for color change and rash.  Neurological: Negative for dizziness and light-headedness.  Hematological: Negative for adenopathy. Does not bruise/bleed easily.    Psychiatric/Behavioral: Negative for dysphoric mood and sleep disturbance.    BP 110/60 (BP Location: Left Arm, Patient Position: Sitting, Cuff Size: Normal)   Pulse 82   Temp (!) 97.4 F (36.3 C) (Temporal)   Resp 16   Ht '5\' 7"'  (1.702 m)   Wt 174 lb 12 oz (79.3 kg)   SpO2 96%   BMI 27.37 kg/m   Physical Exam  Constitutional: He is oriented to person, place, and time. He appears well-developed and well-nourished.  HENT:  Head: Normocephalic and atraumatic.  Right Ear: External ear normal.  Left Ear: External ear normal.  Nose: Nose normal.  Mouth/Throat: Oropharynx is clear and moist.  Ear canals are clear, TMs are clear  Eyes: Conjunctivae are normal. Pupils are equal, round, and reactive to light.  Neck: Normal range of motion. Neck supple. No JVD present. No thyromegaly present.  Cardiovascular: Normal rate, regular rhythm, normal heart sounds and intact distal pulses.  Pulses:      Dorsalis pedis pulses are 2+ on the right side, and 2+ on the left side.       Posterior tibial pulses are 2+ on the right side, and 2+ on the left side.  Pulmonary/Chest: Effort normal and breath sounds normal. No respiratory distress.  Abdominal: Soft. Normal aorta and bowel sounds are normal. There is no hepatosplenomegaly.  Musculoskeletal: Normal range of motion. He exhibits no edema.       Right foot: There is normal range of motion and no deformity.       Left foot: There is normal range of motion and no deformity.  Feet:  Right Foot:  Protective Sensation: 4 sites tested. 4 sites sensed.  Skin Integrity: Negative for ulcer, skin breakdown or callus.  Left Foot:  Protective Sensation: 4 sites tested. 4 sites sensed.  Skin Integrity: Negative for ulcer, skin breakdown or callus.  Lymphadenopathy:    He has no cervical adenopathy.  Neurological: He is alert and oriented to person, place, and time. He displays normal reflexes. Coordination normal.  Gait normal  Skin: Skin is warm and  dry.  Psychiatric: He has a normal mood and affect. His behavior is normal. Thought content normal.  Nursing note and vitals reviewed.   ASSESSMENT/PLAN:  1. Type 2 diabetes mellitus without complication, without long-term current use of insulin (HCC) Controlled.  His blood sugars are good off of insulin.  We will leave him on the Amaryl and recheck an A1c in 3 months.  Diet and exercise reviewed.  2. Essential hypertension Controlled/low.  Asymptomatic.  3. Vitamin D deficiency By history.  He is taking a supplement  4. Hyperlipidemia LDL goal <70 By history.  He is on high-dose Lipitor with no side effects or problems 5.  Annual physical examination No unexpected findings.  Patient Instructions  You are doing very well Walk every day to tolerance No change in medicine Stay on one Amaryl a day Need to check labs in 3 months See me following the labs Call  for problems   Raylene Everts, MD

## 2017-02-23 NOTE — Patient Instructions (Signed)
You are doing very well Walk every day to tolerance No change in medicine Stay on one Amaryl a day Need to check labs in 3 months See me following the labs Call for problems

## 2017-03-01 ENCOUNTER — Encounter: Payer: Self-pay | Admitting: Family Medicine

## 2017-03-03 ENCOUNTER — Encounter: Payer: Self-pay | Admitting: Cardiovascular Disease

## 2017-03-03 ENCOUNTER — Ambulatory Visit (INDEPENDENT_AMBULATORY_CARE_PROVIDER_SITE_OTHER): Payer: Medicare Other | Admitting: Cardiovascular Disease

## 2017-03-03 VITALS — BP 110/60 | HR 67 | Ht 69.0 in | Wt 176.0 lb

## 2017-03-03 DIAGNOSIS — N183 Chronic kidney disease, stage 3 unspecified: Secondary | ICD-10-CM

## 2017-03-03 DIAGNOSIS — Z95810 Presence of automatic (implantable) cardiac defibrillator: Secondary | ICD-10-CM | POA: Diagnosis not present

## 2017-03-03 DIAGNOSIS — Z794 Long term (current) use of insulin: Secondary | ICD-10-CM | POA: Diagnosis not present

## 2017-03-03 DIAGNOSIS — I209 Angina pectoris, unspecified: Secondary | ICD-10-CM

## 2017-03-03 DIAGNOSIS — I519 Heart disease, unspecified: Secondary | ICD-10-CM | POA: Diagnosis not present

## 2017-03-03 DIAGNOSIS — I5022 Chronic systolic (congestive) heart failure: Secondary | ICD-10-CM

## 2017-03-03 DIAGNOSIS — I25118 Atherosclerotic heart disease of native coronary artery with other forms of angina pectoris: Secondary | ICD-10-CM

## 2017-03-03 DIAGNOSIS — E119 Type 2 diabetes mellitus without complications: Secondary | ICD-10-CM | POA: Diagnosis not present

## 2017-03-03 DIAGNOSIS — E782 Mixed hyperlipidemia: Secondary | ICD-10-CM

## 2017-03-03 DIAGNOSIS — IMO0001 Reserved for inherently not codable concepts without codable children: Secondary | ICD-10-CM

## 2017-03-03 NOTE — Patient Instructions (Addendum)
Your physician wants you to follow-up in: 3 months with Dr.Koneswaran at the [facility] office    Your physician recommends that you continue on your current medications as directed. Please refer to the Current Medication list given to you today.   If you need a refill on your cardiac medications before your next appointment, please call your pharmacy.    Please get FASTING Lipid lab work JUST BEFORE your next visit with Dr.Koneswaran     No tests ordered today.      Thank you for choosing  [facility] !

## 2017-03-03 NOTE — Progress Notes (Signed)
SUBJECTIVE: The patient presents to establish routine cardiac care with me.  This is my first time meeting him.  He was hospitalized at [facility] from November 28 - January 04, 2017 for evaluation of coronary artery disease.  He had been on vacation in [place] and experienced a cardiac arrest (required CPR/ACLS for 10+ minutes per wife's report) with catheterization showing multivessel CAD.  A bystander in a parking lot administered CPR.  He was taken to a hospital in [place].  He arrived to [facility] on 12/30/2016 via a private vehicle for further evaluation due to wanting to be closer to home prior to undergoing anticipated CABG. His cath films were reviewed by Cardiology and CT Surgery which showed a tight proximal LAD stenosis with the remainder of the vessel being small in caliber and the LCx with mild proximal narrowing and the RCA with no significant stenosis. CT Surgery felt his LAD was not graftable due to it being small in caliber and diffusely diseased to the apex. The interventional team reviewed his films as well and no good targets for PCI were identified. Echo showed a significantly reduced EF of 15-20%. Due to his recent cardiac arrest and reduced EF, EP was consulted for consideration of ICD placement. On 01/01/2017, he underwent placement of a Medtronic biventricular ICD. No immediate complications were noted. He was started on ASA 58m daily, Atorvastatin 856mdaily, Coreg 6.2553mID, and Losartan 75m30mD with plans to add Spironolactone as an outpatient if BP allows.   He has been feeling well.  He denies exertional chest pain.  He has some right sided chest pain when first awakening and has had this since CPR was performed.  He denies exertional dyspnea, orthopnea, palpitations, and leg edema.  He is considering going to cardiac rehab.  His wife asked undergo rotator cuff repair.    Social history: His wife, JohnJosephina Gip also my patient.  He is a miniCompany secretaryeview  of Systems: As per "subjective", otherwise negative.  No Known Allergies  Current Outpatient Medications  Medication Sig Dispense Refill  . aspirin EC 81 MG tablet Take 81 mg by mouth daily.    . atMarland Kitchenrvastatin (LIPITOR) 80 MG tablet Take 1 tablet (80 mg total) by mouth at bedtime. 90 tablet 3  . blood glucose meter kit and supplies KIT Test daily.  Dx e11.22 1 each 0  . carvedilol (COREG) 6.25 MG tablet Take 1 tablet (6.25 mg total) by mouth 2 (two) times daily. 180 tablet 3  . finasteride (PROSCAR) 5 MG tablet Take 1 tablet (5 mg total) by mouth at bedtime. 90 tablet 3  . glimepiride (AMARYL) 4 MG tablet Take 1 tablet (4 mg total) by mouth 2 (two) times daily. 180 tablet 3  . glucose blood (COOL BLOOD GLUCOSE TEST STRIPS) test strip Use as instructed 100 each 12  . losartan (COZAAR) 50 MG tablet Take 1 tablet (50 mg total) by mouth 2 (two) times daily. 180 tablet 3  . nitroGLYCERIN (NITROSTAT) 0.4 MG SL tablet Place 1 tablet (0.4 mg total) under the tongue every 5 (five) minutes x 3 doses as needed for chest pain. 25 tablet 2  . tamsulosin (FLOMAX) 0.4 MG CAPS capsule Take 1 capsule (0.4 mg total) by mouth at bedtime. 5 capsule 0  . Vitamin D, Ergocalciferol, (DRISDOL) 50000 units CAPS capsule Take 1 capsule (50,000 Units total) by mouth every 30 (thirty) days. 12 capsule 0   No current facility-administered medications for this  visit.     Past Medical History:  Diagnosis Date  . Acid reflux   . Blood transfusion without reported diagnosis   . Cardiac arrest ([place])    a. occuring in 12/2016. LAD disease but no targets for CABG or PCI. Underwent ICD placement as EF 15-20%.   . CHF (congestive heart failure) ([place])   . Coronary artery disease   . Diabetes mellitus without complication ([place])   . Hyperlipidemia   . Hypertension   . Myocardial infarction ([place])   . Renal disorder     Past Surgical History:  Procedure Laterality Date  . BIV ICD INSERTION CRT-D N/A 01/01/2017    Procedure: BIV ICD INSERTION CRT-D;  Surgeon: Constance Haw, MD;  Location: [place] [facility];  Service: Cardiovascular;  Laterality: N/A;    Social History   Socioeconomic History  . Marital status: Married    Spouse name: [name]  . Number of children: 3  . Years of education: 17  . Highest education level: Some college, no degree  Social Needs  . Financial resource strain: Not hard at all  . Food insecurity - worry: Never true  . Food insecurity - inability: Never true  . Transportation needs - medical: No  . Transportation needs - non-medical: No  Occupational History  . Occupation: Radiographer, therapeutic   retired  . Occupation: pastor  Tobacco Use  . Smoking status: Never Smoker  . Smokeless tobacco: Never Used  Substance and Sexual Activity  . Alcohol use: No  . Drug use: No  . Sexual activity: No  Other Topics Concern  . Not on file  Social History Narrative   Lives with with wife [name]   Married 44 years   Daughter [name] stays frequently   Granddaughter [name] near by     Vitals:   03/03/17 1011  BP: 110/60  Pulse: 67  SpO2: 96%  Weight: 176 lb (79.8 kg)  Height: _0  (1.753 m)    Wt Readings from Last 3 Encounters:  03/03/17 176 lb (79.8 kg)  02/23/17 174 lb 12 oz (79.3 kg)  02/09/17 175 lb (79.4 kg)     PHYSICAL EXAM General: NAD HEENT: Normal. Neck: No JVD, no thyromegaly. Lungs: Clear to auscultation bilaterally with normal respiratory effort. CV: Regular rate and rhythm, normal S1/S2, no S3/S4, no murmur. No pretibial or periankle edema.  No carotid bruit.   Abdomen: Soft, nontender, no distention.  Neurologic: Alert and oriented.  Psych: Normal affect. Skin: Normal. Musculoskeletal: No gross deformities.    ECG: Most recent ECG reviewed.   Labs: Lab Results  Component Value Date/Time   K 4.0 02/09/2017 02:29 PM   BUN 15 02/09/2017 02:29 PM   CREATININE 1.24 (H) 02/09/2017 02:29 PM   ALT 23 02/09/2017 02:29 PM   HGB 13.2  02/09/2017 02:29 PM     Lipids: Lab Results  Component Value Date/Time   LDLCALC 121 (H) 12/31/2016 03:53 AM   CHOL 173 02/09/2017 02:29 PM   TRIG 148 02/09/2017 02:29 PM   HDL 42 02/09/2017 02:29 PM       ASSESSMENT AND PLAN: 1.  Chronic systolic heart failure: Symptomatically stable.  He has severely reduced left ventricular systolic function, LVEF 81-85%.  I will continue carvedilol 6.25 mg twice daily and losartan 50 mg twice daily.  I will hold off on adding spironolactone at this time.  I will repeat an echocardiogram in the future to assess for interval improvement in left ventricular systolic function.  2.  Ischemic cardiomyopathy status post biventricular ICD in November 2018: Symptomatically stable.  Followed by EP.  3.  Multivessel coronary artery disease: Stable ischemic heart disease.  He has a tight proximal LAD stenosis with the remainder of the vessel being small in caliber.  The circumflex had mild proximal narrowing in the RCA had no significant stenosis.  There were no graftable targets nor good targets for PCI.  Continue aspirin, statin, and beta-blocker.  4.  Hyperlipidemia: He is on high intensity statin therapy with Lipitor 80 mg.  LDL was 105 on 02/09/17 which is not at goal.  It has been 121 on 12/31/16.  He said he eats a lot of fried chicken.  I have asked him to modify his diet.  I will repeat lipids in 3 months.  If LDL remains elevated, I will add Zetia 10 mg.  5.  Chronic kidney disease stage III: Creatinine 1.24 on 02/09/17.  6.  Insulin-dependent diabetes mellitus: I reviewed his blood sugars.  He has not had any further hypoglycemic episodes.    Disposition: Follow up 3 months   Kate Sable, M.D., F.A.C.C.

## 2017-03-04 ENCOUNTER — Telehealth: Payer: Self-pay | Admitting: *Deleted

## 2017-03-04 NOTE — Telephone Encounter (Signed)
Patient asked if his strips could be called in again due to the pharmacy telling him there is not a rx there for strips

## 2017-03-05 MED ORDER — GLUCOSE BLOOD VI STRP: ORAL_STRIP | 12 refills | Status: DC

## 2017-03-05 NOTE — Telephone Encounter (Signed)
Done

## 2017-03-08 ENCOUNTER — Other Ambulatory Visit: Payer: Self-pay

## 2017-03-08 MED ORDER — BLOOD GLUCOSE MONITOR KIT: PACK | 0 refills | Status: DC

## 2017-03-17 ENCOUNTER — Other Ambulatory Visit: Payer: Self-pay

## 2017-04-02 ENCOUNTER — Ambulatory Visit (HOSPITAL_COMMUNITY): Payer: Medicare Other

## 2017-04-02 ENCOUNTER — Ambulatory Visit (HOSPITAL_COMMUNITY)
Admission: RE | Admit: 2017-04-02 | Discharge: 2017-04-02 | Disposition: A | Discharge status: Home / Self Care | Payer: Medicare Other | Source: Ambulatory Visit | Attending: Urology | Admitting: Urology

## 2017-04-02 ENCOUNTER — Encounter: Payer: Medicare Other | Admitting: Internal Medicine

## 2017-04-02 DIAGNOSIS — N132 Hydronephrosis with renal and ureteral calculous obstruction: Secondary | ICD-10-CM | POA: Diagnosis not present

## 2017-04-02 DIAGNOSIS — N201 Calculus of ureter: Secondary | ICD-10-CM

## 2017-04-02 DIAGNOSIS — K869 Disease of pancreas, unspecified: Secondary | ICD-10-CM | POA: Diagnosis not present

## 2017-04-02 DIAGNOSIS — K4090 Unilateral inguinal hernia, without obstruction or gangrene, not specified as recurrent: Secondary | ICD-10-CM | POA: Diagnosis not present

## 2017-04-02 DIAGNOSIS — N400 Enlarged prostate without lower urinary tract symptoms: Secondary | ICD-10-CM | POA: Insufficient documentation

## 2017-04-02 DIAGNOSIS — R93421 Abnormal radiologic findings on diagnostic imaging of right kidney: Secondary | ICD-10-CM | POA: Insufficient documentation

## 2017-04-09 ENCOUNTER — Other Ambulatory Visit (HOSPITAL_COMMUNITY)
Admission: RE | Admit: 2017-04-09 | Discharge: 2017-04-09 | Disposition: A | Discharge status: Home / Self Care | Payer: Medicare Other | Source: Other Acute Inpatient Hospital | Attending: Urology | Admitting: Urology

## 2017-04-09 ENCOUNTER — Ambulatory Visit (INDEPENDENT_AMBULATORY_CARE_PROVIDER_SITE_OTHER): Payer: Medicare Other | Admitting: Urology

## 2017-04-09 DIAGNOSIS — R3915 Urgency of urination: Secondary | ICD-10-CM | POA: Insufficient documentation

## 2017-04-09 DIAGNOSIS — N201 Calculus of ureter: Secondary | ICD-10-CM

## 2017-04-09 DIAGNOSIS — N281 Cyst of kidney, acquired: Secondary | ICD-10-CM | POA: Diagnosis not present

## 2017-04-12 LAB — URINE CULTURE: Culture: >=100000 — AB

## 2017-04-14 ENCOUNTER — Other Ambulatory Visit: Payer: Self-pay | Admitting: Urology

## 2017-04-14 DIAGNOSIS — N201 Calculus of ureter: Secondary | ICD-10-CM

## 2017-04-23 ENCOUNTER — Ambulatory Visit (INDEPENDENT_AMBULATORY_CARE_PROVIDER_SITE_OTHER): Payer: Medicare Other | Admitting: Internal Medicine

## 2017-04-23 ENCOUNTER — Encounter: Payer: Self-pay | Admitting: Internal Medicine

## 2017-04-23 VITALS — BP 94/58 | HR 64 | Ht 69.0 in | Wt 178.0 lb

## 2017-04-23 DIAGNOSIS — I5023 Acute on chronic systolic (congestive) heart failure: Secondary | ICD-10-CM | POA: Diagnosis not present

## 2017-04-23 DIAGNOSIS — I255 Ischemic cardiomyopathy: Secondary | ICD-10-CM | POA: Diagnosis not present

## 2017-04-23 NOTE — Patient Instructions (Addendum)
Medication Instructions:  Continue all current medications.  Labwork: none  Testing/Procedures:  Your physician has requested that you have an echocardiogram. Echocardiography is a painless test that uses sound waves to create images of your heart. It provides your doctor with information about the size and shape of your heart and how well your heart's chambers and valves are working. This procedure takes approximately one hour. There are no restrictions for this procedure. - TO BE DONE PRIOR TO VISIT WITH DR. Purvis Sheffield IN MAY  Office will contact with results via phone or letter.    Follow-Up: Your physician wants you to follow up in:  1 year.  You will receive a reminder letter in the mail one-two months in advance.  If you don't receive a letter, please call our office to schedule the follow up appointment   Any Other Special Instructions Will Be Listed Below (If Applicable). Remote monitoring is used to monitor your Pacemaker of ICD from home. This monitoring reduces the number of office visits required to check your device to one time per year. It allows [place] to keep an eye on the functioning of your device to ensure it is working properly. You are scheduled for a device check from home on 07/26/2017. You may send your transmission at any time that day. If you have a wireless device, the transmission will be sent automatically. After your physician reviews your transmission, you will receive a postcard with your next transmission date.  If you need a refill on your cardiac medications before your next appointment, please call your pharmacy.  Enroll in [facility] clinic.

## 2017-04-23 NOTE — Progress Notes (Signed)
PCP: Caren Macadam, MD Primary Cardiologist: Dr Bronson Ing Primary EP: Dr Rayann Heman  Omar Williams is a 82 y.o. male who presents today for routine electrophysiology followup.  Since his BiV ICD implant, the patient reports doing very well.  Today, he denies symptoms of palpitations, chest pain, shortness of breath,  lower extremity edema, dizziness, presyncope, syncope, or ICD shocks.  + rib soreness due to CPR with cardiac arrest 11/18.  The patient is otherwise without complaint today.   Past Medical History:  Diagnosis Date  . Acid reflux   . Blood transfusion without reported diagnosis   . Cardiac arrest ([place])    a. occuring in 12/2016. LAD disease but no targets for CABG or PCI. Underwent ICD placement as EF 15-20%.   . CHF (congestive heart failure) ([place])   . Coronary artery disease   . Diabetes mellitus without complication ([place])   . Hyperlipidemia   . Hypertension   . Myocardial infarction ([place])   . Renal disorder    Past Surgical History:  Procedure Laterality Date  . BIV ICD INSERTION CRT-D N/A 01/01/2017   Procedure: BIV ICD INSERTION CRT-D;  Surgeon: Constance Haw, MD;  Location: [place] [facility];  Service: Cardiovascular;  Laterality: N/A;    ROS- all systems are reviewed and negative except as per HPI above  Current Outpatient Medications  Medication Sig Dispense Refill  . aspirin EC 81 MG tablet Take 81 mg by mouth daily.    Marland Kitchen atorvastatin (LIPITOR) 80 MG tablet Take 1 tablet (80 mg total) by mouth at bedtime. 90 tablet 3  . blood glucose meter kit and supplies KIT Test daily.  Dx e11.22 1 each 0  . carvedilol (COREG) 6.25 MG tablet Take 1 tablet (6.25 mg total) by mouth 2 (two) times daily. 180 tablet 3  . finasteride (PROSCAR) 5 MG tablet Take 1 tablet (5 mg total) by mouth at bedtime. 90 tablet 3  . glimepiride (AMARYL) 4 MG tablet Take 1 tablet (4 mg total) by mouth 2 (two) times daily. 180 tablet 3  . glucose blood (COOL BLOOD GLUCOSE TEST STRIPS)  test strip Use as instructed 100 each 12  . losartan (COZAAR) 50 MG tablet Take 1 tablet (50 mg total) by mouth 2 (two) times daily. 180 tablet 3  . nitroGLYCERIN (NITROSTAT) 0.4 MG SL tablet Place 1 tablet (0.4 mg total) under the tongue every 5 (five) minutes x 3 doses as needed for chest pain. 25 tablet 2  . tamsulosin (FLOMAX) 0.4 MG CAPS capsule Take 1 capsule (0.4 mg total) by mouth at bedtime. 5 capsule 0  . Vitamin D, Ergocalciferol, (DRISDOL) 50000 units CAPS capsule Take 1 capsule (50,000 Units total) by mouth every 30 (thirty) days. 12 capsule 0   No current facility-administered medications for this visit.     Physical Exam: Vitals:   04/23/17 1301  BP: (!) 94/58  Pulse: 64  SpO2: 95%  Weight: 178 lb (80.7 kg)  Height: '5\' 9"'$  (1.753 m)    GEN- The patient is well appearing, alert and oriented x 3 today.   Head- normocephalic, atraumatic Eyes-  Sclera clear, conjunctiva pink Ears- hearing intact Oropharynx- clear Lungs- Clear to ausculation bilaterally, normal work of breathing Chest- ICD pocket is well healed Heart- Regular rate and rhythm, no murmurs, rubs or gallops, PMI not laterally displaced GI- soft, NT, ND, + BS Extremities- no clubbing, cyanosis, or edema  ICD interrogation- reviewed in detail today,  See PACEART report  ekg tracing 12/18  is personally reviewed and reveals sinus with BiV pacing  Assessment and Plan:  1.  Chronic systolic dysfunction/ ischemic CM/ CAD euvolemic today No ischemic symptoms Stable on an appropriate medical regimen Normal BiV ICD function See Pace Art report No changes today Repeat echo prior to next visit with Dr Bronson Ing Will enroll in [facility] device clinic  2. S/p VF arrest 12/2016 Normal ICD function No arrhythmias on ICD today I have reinforced today that he cannot drive for 6 months post VF arrest  Carelink Return to see me in a year Follow-up with Dr Bronson Ing as scheduled  Thompson Grayer MD,  [facility] 04/23/2017 1:20 PM

## 2017-04-27 ENCOUNTER — Ambulatory Visit (HOSPITAL_COMMUNITY)
Admission: RE | Admit: 2017-04-27 | Discharge: 2017-04-27 | Disposition: A | Discharge status: Home / Self Care | Payer: Medicare Other | Source: Ambulatory Visit | Attending: Urology | Admitting: Urology

## 2017-04-27 DIAGNOSIS — N201 Calculus of ureter: Secondary | ICD-10-CM

## 2017-04-27 DIAGNOSIS — N281 Cyst of kidney, acquired: Secondary | ICD-10-CM | POA: Diagnosis not present

## 2017-04-27 DIAGNOSIS — N200 Calculus of kidney: Secondary | ICD-10-CM | POA: Diagnosis not present

## 2017-04-27 LAB — CUP PACEART INCLINIC DEVICE CHECK
Battery Remaining Longevity: 103 mo
Battery Voltage: 3.06 V
Brady Statistic AS VP Percent: 56.76 %
Brady Statistic AS VS Percent: 0.59 %
Brady Statistic RV Percent Paced: 64.12 %
HighPow Impedance: 77 Ohm
Implantable Lead Implant Date: 20181130
Implantable Lead Implant Date: 20181130
Implantable Lead Location: 753858
Implantable Lead Location: 753859
Implantable Lead Model: 5076
Implantable Lead Model: 6935
Lead Channel Impedance Value: 141.867
Lead Channel Impedance Value: 145.871
Lead Channel Impedance Value: 266 Ohm
Lead Channel Impedance Value: 304 Ohm
Lead Channel Impedance Value: 323 Ohm
Lead Channel Impedance Value: 380 Ohm
Lead Channel Impedance Value: 494 Ohm
Lead Channel Impedance Value: 570 Ohm
Lead Channel Pacing Threshold Amplitude: 0.75 V
Lead Channel Pacing Threshold Pulse Width: 0.4 ms
Lead Channel Pacing Threshold Pulse Width: 0.4 ms
Lead Channel Sensing Intrinsic Amplitude: 2.5 mV
Lead Channel Sensing Intrinsic Amplitude: 8.625 mV
Lead Channel Sensing Intrinsic Amplitude: 9.125 mV
Lead Channel Setting Pacing Amplitude: 1.25 V
Lead Channel Setting Pacing Amplitude: 2 V
Lead Channel Setting Pacing Pulse Width: 0.4 ms
MDC IDC LEAD IMPLANT DT: 20181130
MDC IDC LEAD LOCATION: 753860
MDC IDC MSMT LEADCHNL LV IMPEDANCE VALUE: 141.867
MDC IDC MSMT LEADCHNL LV IMPEDANCE VALUE: 156.606
MDC IDC MSMT LEADCHNL LV IMPEDANCE VALUE: 156.606
MDC IDC MSMT LEADCHNL LV IMPEDANCE VALUE: 304 Ohm
MDC IDC MSMT LEADCHNL LV IMPEDANCE VALUE: 380 Ohm
MDC IDC MSMT LEADCHNL LV IMPEDANCE VALUE: 494 Ohm
MDC IDC MSMT LEADCHNL LV IMPEDANCE VALUE: 494 Ohm
MDC IDC MSMT LEADCHNL LV IMPEDANCE VALUE: 494 Ohm
MDC IDC MSMT LEADCHNL LV IMPEDANCE VALUE: 494 Ohm
MDC IDC MSMT LEADCHNL RA PACING THRESHOLD AMPLITUDE: 0.75 V
MDC IDC MSMT LEADCHNL RA PACING THRESHOLD PULSEWIDTH: 0.4 ms
MDC IDC MSMT LEADCHNL RA SENSING INTR AMPL: 2.375 mV
MDC IDC MSMT LEADCHNL RV IMPEDANCE VALUE: 532 Ohm
MDC IDC MSMT LEADCHNL RV PACING THRESHOLD AMPLITUDE: 0.5 V
MDC IDC PG IMPLANT DT: 20181130
MDC IDC SESS DTM: 20190322170848
MDC IDC SET LEADCHNL RV PACING AMPLITUDE: 2.5 V
MDC IDC SET LEADCHNL RV PACING PULSEWIDTH: 0.4 ms
MDC IDC SET LEADCHNL RV SENSING SENSITIVITY: 0.3 mV
MDC IDC STAT BRADY AP VP PERCENT: 42.6 %
MDC IDC STAT BRADY AP VS PERCENT: 0.06 %
MDC IDC STAT BRADY RA PERCENT PACED: 42.64 %

## 2017-04-30 ENCOUNTER — Telehealth: Payer: Self-pay

## 2017-04-30 NOTE — Telephone Encounter (Signed)
Patient referred to [facility] clinic by Dr Johney Frame.  Spoke with wife, DPR, and provided ICM intro call.  Explained ICM program and she agreed to monthly follow up.  1st ICM remote scheduled for 05/10/2017. The carelink monitor is by bedside.  Provided ICM direct number and instructed to call for any fluid symptoms.

## 2017-04-30 NOTE — Telephone Encounter (Signed)
Patient called back to ask about remote scheduling.  Explained the remote will transmit during the night from his house and to disregard the daytime appointment he sees in my chart.  He verbalized understanding.

## 2017-05-10 ENCOUNTER — Ambulatory Visit (INDEPENDENT_AMBULATORY_CARE_PROVIDER_SITE_OTHER): Payer: Self-pay

## 2017-05-10 DIAGNOSIS — I5022 Chronic systolic (congestive) heart failure: Secondary | ICD-10-CM

## 2017-05-10 DIAGNOSIS — Z95810 Presence of automatic (implantable) cardiac defibrillator: Secondary | ICD-10-CM

## 2017-05-10 NOTE — Progress Notes (Signed)
EPIC Encounter for ICM Monitoring  Patient Name: Legion Hitchner is a 82 y.o. male Date: 05/10/2017 Primary Care Physican: Aliene Beams, MD Primary Cardiologist:  Purvis Sheffield Electrophysiologist: Allred Dry Weight:  unknown Bi-V Pacing:   99.4%       1st ICM remote transmission.  Attempted call to patient and wife. Unable to reach.  Left message to return call.  Transmission reviewed.    Thoracic impedance abnormal suggesting fluid accumulation since 04/07/2017 but has returned to baseline today.  No diuretic  Recommendations: NONE - Unable to reach.  Follow-up plan: ICM clinic phone appointment on 06/10/2017.  Office appointment scheduled 06/11/2017 with Dr. Purvis Sheffield.  Copy of ICM check sent to Dr. Johney Frame.   3 month ICM trend: 05/10/2017    1 Year ICM trend:       Karie Soda, RN 05/10/2017 11:41 AM

## 2017-05-11 ENCOUNTER — Telehealth: Payer: Self-pay

## 2017-05-11 NOTE — Telephone Encounter (Signed)
Remote ICM transmission received.  Attempted call to patient and left message per DPR to return call.    

## 2017-05-13 NOTE — Progress Notes (Signed)
Returned patient call. He said he is feeling well and not having any fluid symptoms. Discussed salt intake and limit to 2000 mg daily.  Encouraged to review food labels for salt amount since there is a lot of hidden salt in foods.  Eats out occasionally.  Current weight is 176 lbs.  Encouraged to call if develops any fluid symptoms.  No changes today and next ICM remote transmission is 06/10/2017.

## 2017-05-25 ENCOUNTER — Ambulatory Visit (INDEPENDENT_AMBULATORY_CARE_PROVIDER_SITE_OTHER): Payer: Medicare Other | Admitting: Family Medicine

## 2017-05-25 ENCOUNTER — Other Ambulatory Visit: Payer: Self-pay

## 2017-05-25 ENCOUNTER — Encounter: Payer: Self-pay | Admitting: Family Medicine

## 2017-05-25 VITALS — BP 124/76 | HR 60 | Temp 98.0°F | Resp 16 | Ht 69.0 in | Wt 177.0 lb

## 2017-05-25 DIAGNOSIS — I255 Ischemic cardiomyopathy: Secondary | ICD-10-CM

## 2017-05-25 DIAGNOSIS — E119 Type 2 diabetes mellitus without complications: Secondary | ICD-10-CM | POA: Diagnosis not present

## 2017-05-25 DIAGNOSIS — R351 Nocturia: Secondary | ICD-10-CM

## 2017-05-25 DIAGNOSIS — N401 Enlarged prostate with lower urinary tract symptoms: Secondary | ICD-10-CM | POA: Diagnosis not present

## 2017-05-25 DIAGNOSIS — I10 Essential (primary) hypertension: Secondary | ICD-10-CM

## 2017-05-25 NOTE — Patient Instructions (Signed)
No change in medicine See Dr Tracie [place] in 3 months Need labs next visit Call before appointment to get lab orders

## 2017-05-25 NOTE — Progress Notes (Signed)
Chief Complaint  Patient presents with  . Type 2 diabetes mellitus without complication, without long-    follow up  . Hyperlipidemia   [name] is doing very well.  He slowly getting his appetite and energy level back.  He still has some chest wall pain anteriorly.  He still thinks this is from the prolonged CPR from his cardiac arrest.  No pain with deep breathing.  No chest pain with exercise.  His appetite is good.  Energy level improving.  He is starting to drive.  No dizziness or syncope.  He is compliant with his diet and his exercise. His last hemoglobin A1c is 6.2. He is on maximum dose of Lipitor 80 mg a day.  His last LDL was 120. He takes nutritional supplements.  He is on turmeric for his joints.  He states if he misses even one day his knees hurt He is on both Proscar and Flomax for his BPH.  He states that he "dribbling" improved.  Nocturia has improved.  Patient Active Problem List   Diagnosis Date Noted  . Type 2 diabetes mellitus with diabetic chronic kidney disease ([place]) 02/09/2017  . Hypertension 02/09/2017  . BPH associated with nocturia 02/09/2017  . Vitamin D deficiency 02/09/2017  . CAD (coronary artery disease) 01/19/2017  . Ischemic cardiomyopathy 01/04/2017  . Hyperlipidemia LDL goal <70 01/04/2017  . NSTEMI (non-ST elevated myocardial infarction) ([place])   . Acute on chronic systolic CHF (congestive heart failure) ([place])     Outpatient Encounter Medications as of 05/25/2017  Medication Sig  . aspirin EC 81 MG tablet Take 81 mg by mouth daily.  Marland Kitchen atorvastatin (LIPITOR) 80 MG tablet Take 1 tablet (80 mg total) by mouth at bedtime.  Omar Williams 550 MG CAPS Take 1 capsule by mouth daily.  . blood glucose meter kit and supplies KIT Test daily.  Dx e11.22  . carvedilol (COREG) 6.25 MG tablet Take 1 tablet (6.25 mg total) by mouth 2 (two) times daily.  . finasteride (PROSCAR) 5 MG tablet Take 1 tablet (5 mg total) by mouth at bedtime.  Marland Kitchen glimepiride (AMARYL)  4 MG tablet Take 1 tablet (4 mg total) by mouth 2 (two) times daily.  Marland Kitchen glucose blood (COOL BLOOD GLUCOSE TEST STRIPS) test strip Use as instructed  . losartan (COZAAR) 50 MG tablet Take 1 tablet (50 mg total) by mouth 2 (two) times daily.  . nitroGLYCERIN (NITROSTAT) 0.4 MG SL tablet Place 1 tablet (0.4 mg total) under the tongue every 5 (five) minutes x 3 doses as needed for chest pain.  . Omega-3 Fatty Acids (FISH OIL) 1200 MG CAPS Take 1 capsule by mouth daily.  . tamsulosin (FLOMAX) 0.4 MG CAPS capsule Take 1 capsule (0.4 mg total) by mouth at bedtime.  . TURMERIC PO Take 538 mg by mouth daily.  . vitamin B-12 (CYANOCOBALAMIN) 1000 MCG tablet Take 1,000 mcg by mouth daily.  . Vitamin D, Ergocalciferol, (DRISDOL) 50000 units CAPS capsule Take 1 capsule (50,000 Units total) by mouth every 30 (thirty) days.   No facility-administered encounter medications on file as of 05/25/2017.     No Known Allergies  Review of Systems  Constitutional: Positive for fatigue. Negative for activity change, appetite change and unexpected weight change.  HENT: Negative for congestion and dental problem.   Eyes: Negative for photophobia and visual disturbance.  Respiratory: Negative for cough, chest tightness and shortness of breath.   Cardiovascular: Positive for chest pain. Negative for palpitations and  leg swelling.       Chest wall pain  Gastrointestinal: Negative for blood in stool, constipation and diarrhea.  Genitourinary: Positive for frequency. Negative for difficulty urinating and dysuria.       Improving  Musculoskeletal: Negative for arthralgias and back pain.  Skin: Negative for color change and rash.  Neurological: Negative for dizziness and light-headedness.  Hematological: Negative for adenopathy. Does not bruise/bleed easily.  Psychiatric/Behavioral: Negative for dysphoric mood and sleep disturbance.    Physical Exam  Constitutional: He is oriented to person, place, and time. He  appears well-developed and well-nourished.  Small elderly man.  Accompanied by wife.  HENT:  Head: Normocephalic and atraumatic.  Mouth/Throat: Oropharynx is clear and moist.  Eyes: Pupils are equal, round, and reactive to light. Conjunctivae are normal.  Neck: Normal range of motion. Neck supple. No JVD present. No thyromegaly present.  Cardiovascular: Normal rate, regular rhythm and normal heart sounds.  Occasional ectopy  Pulmonary/Chest: Effort normal and breath sounds normal. No respiratory distress.  Abdominal: Soft. Bowel sounds are normal.  Musculoskeletal: Normal range of motion. He exhibits no edema.  Lymphadenopathy:    He has no cervical adenopathy.  Neurological: He is alert and oriented to person, place, and time.  Gait normal  Skin: Skin is warm and dry.  Psychiatric: He has a normal mood and affect. His behavior is normal. Thought content normal.  Nursing note and vitals reviewed.   BP 124/76   Pulse 60   Temp 98 F (36.7 C) (Oral)   Resp 16   Ht _0  (1.753 m)   Wt 177 lb (80.3 kg)   SpO2 96%   BMI 26.14 kg/m     ASSESSMENT/PLAN:  1. Type 2 diabetes mellitus without complication, without long-term current use of insulin (HCC) Controlled  2. Essential hypertension Well-controlled  3. Ischemic cardiomyopathy Asymptomatic.  I did sign a handicap placard for him today  4.  BPH with nocturia Improved on medication  Patient Instructions  No change in medicine See Omar Williams in 3 months Need labs next visit Call before appointment to get lab orders   Omar Everts, MD

## 2017-05-28 ENCOUNTER — Ambulatory Visit (INDEPENDENT_AMBULATORY_CARE_PROVIDER_SITE_OTHER): Payer: Medicare Other | Admitting: Urology

## 2017-05-28 ENCOUNTER — Other Ambulatory Visit: Payer: Self-pay | Admitting: Urology

## 2017-05-28 ENCOUNTER — Ambulatory Visit (HOSPITAL_COMMUNITY)
Admission: RE | Admit: 2017-05-28 | Discharge: 2017-05-28 | Disposition: A | Discharge status: Home / Self Care | Payer: Medicare Other | Source: Ambulatory Visit | Attending: Urology | Admitting: Urology

## 2017-05-28 DIAGNOSIS — N201 Calculus of ureter: Secondary | ICD-10-CM

## 2017-05-28 DIAGNOSIS — N401 Enlarged prostate with lower urinary tract symptoms: Secondary | ICD-10-CM

## 2017-05-28 DIAGNOSIS — Z87442 Personal history of urinary calculi: Secondary | ICD-10-CM | POA: Diagnosis not present

## 2017-05-31 DIAGNOSIS — N200 Calculus of kidney: Secondary | ICD-10-CM | POA: Diagnosis not present

## 2017-05-31 DIAGNOSIS — E559 Vitamin D deficiency, unspecified: Secondary | ICD-10-CM | POA: Diagnosis not present

## 2017-05-31 DIAGNOSIS — E1129 Type 2 diabetes mellitus with other diabetic kidney complication: Secondary | ICD-10-CM | POA: Diagnosis not present

## 2017-05-31 DIAGNOSIS — N183 Chronic kidney disease, stage 3 (moderate): Secondary | ICD-10-CM | POA: Diagnosis not present

## 2017-06-01 ENCOUNTER — Other Ambulatory Visit (HOSPITAL_COMMUNITY)
Admission: RE | Admit: 2017-06-01 | Discharge: 2017-06-01 | Disposition: A | Discharge status: Home / Self Care | Payer: Medicare Other | Source: Ambulatory Visit | Attending: Cardiovascular Disease | Admitting: Cardiovascular Disease

## 2017-06-01 DIAGNOSIS — E782 Mixed hyperlipidemia: Secondary | ICD-10-CM | POA: Diagnosis not present

## 2017-06-01 LAB — LIPID PANEL
Cholesterol: 160 mg/dL (ref 0–200)
HDL: 49 mg/dL (ref >40)
LDL CALC: 84 mg/dL (ref 0–99)
TRIGLYCERIDES: 133 mg/dL (ref <150)
Total CHOL/HDL Ratio: 3.3 RATIO
VLDL: 27 mg/dL (ref 0–40)

## 2017-06-04 DIAGNOSIS — R809 Proteinuria, unspecified: Secondary | ICD-10-CM | POA: Diagnosis not present

## 2017-06-04 DIAGNOSIS — Z1159 Encounter for screening for other viral diseases: Secondary | ICD-10-CM | POA: Diagnosis not present

## 2017-06-04 DIAGNOSIS — E559 Vitamin D deficiency, unspecified: Secondary | ICD-10-CM | POA: Diagnosis not present

## 2017-06-04 DIAGNOSIS — Z79899 Other long term (current) drug therapy: Secondary | ICD-10-CM | POA: Diagnosis not present

## 2017-06-04 DIAGNOSIS — N183 Chronic kidney disease, stage 3 (moderate): Secondary | ICD-10-CM | POA: Diagnosis not present

## 2017-06-04 DIAGNOSIS — I10 Essential (primary) hypertension: Secondary | ICD-10-CM | POA: Diagnosis not present

## 2017-06-04 DIAGNOSIS — D509 Iron deficiency anemia, unspecified: Secondary | ICD-10-CM | POA: Diagnosis not present

## 2017-06-08 ENCOUNTER — Telehealth: Payer: Self-pay | Admitting: *Deleted

## 2017-06-08 NOTE — Telephone Encounter (Signed)
-----   Message from Albertine Patricia, CMA sent at 06/08/2017  8:19 AM EDT -----   ----- Message ----- From: Laqueta Linden, MD Sent: 06/03/2017  10:22 AM To: Lesle Chris, LPN  LDL has decreased but ideally should be less than 70. Continue dietary modification and repeat in 6 months.

## 2017-06-08 NOTE — Telephone Encounter (Signed)
Pt made aware and voiced understanding - routed to pcp  

## 2017-06-09 ENCOUNTER — Other Ambulatory Visit: Payer: Self-pay

## 2017-06-09 ENCOUNTER — Ambulatory Visit (INDEPENDENT_AMBULATORY_CARE_PROVIDER_SITE_OTHER): Payer: Medicare Other

## 2017-06-09 DIAGNOSIS — I5023 Acute on chronic systolic (congestive) heart failure: Secondary | ICD-10-CM | POA: Diagnosis not present

## 2017-06-10 ENCOUNTER — Ambulatory Visit (INDEPENDENT_AMBULATORY_CARE_PROVIDER_SITE_OTHER): Payer: Medicare Other

## 2017-06-10 DIAGNOSIS — Z95810 Presence of automatic (implantable) cardiac defibrillator: Secondary | ICD-10-CM | POA: Diagnosis not present

## 2017-06-10 DIAGNOSIS — I5022 Chronic systolic (congestive) heart failure: Secondary | ICD-10-CM

## 2017-06-10 NOTE — Progress Notes (Signed)
EPIC Encounter for ICM Monitoring  Patient Name: Omar Williams is a 82 y.o. male Date: 06/10/2017 Primary Care Physican: Aliene Beams, MD Primary Cardiologist:  Purvis Sheffield Electrophysiologist: Allred Dry Weight:  176 lbs Bi-V Pacing:   99.6%           Heart Failure questions reviewed, pt asymptomatic.  He is feeling well at this time.    Thoracic impedance normal but was abnormal suggesting fluid accumulation from 05/12/2017 - 05/19/2017.  No diuretic  Recommendations: No changes.   Encouraged to call for fluid symptoms.  Follow-up plan: ICM clinic phone appointment on 07/12/2017.  Office appointment scheduled 06/11/2017 with Dr. Purvis Sheffield.  Copy of ICM check sent to Dr. Johney Frame and Dr. Purvis Sheffield.   3 month ICM trend: 06/10/2017    1 Year ICM trend:       Karie Soda, RN 06/10/2017 11:11 AM

## 2017-06-11 ENCOUNTER — Ambulatory Visit (INDEPENDENT_AMBULATORY_CARE_PROVIDER_SITE_OTHER): Payer: Medicare Other | Admitting: Cardiovascular Disease

## 2017-06-11 ENCOUNTER — Encounter: Payer: Self-pay | Admitting: Cardiovascular Disease

## 2017-06-11 ENCOUNTER — Other Ambulatory Visit: Payer: Self-pay

## 2017-06-11 VITALS — BP 132/81 | HR 58 | Ht 69.0 in | Wt 179.0 lb

## 2017-06-11 DIAGNOSIS — I255 Ischemic cardiomyopathy: Secondary | ICD-10-CM | POA: Diagnosis not present

## 2017-06-11 DIAGNOSIS — I5022 Chronic systolic (congestive) heart failure: Secondary | ICD-10-CM

## 2017-06-11 DIAGNOSIS — E785 Hyperlipidemia, unspecified: Secondary | ICD-10-CM | POA: Diagnosis not present

## 2017-06-11 DIAGNOSIS — Z95810 Presence of automatic (implantable) cardiac defibrillator: Secondary | ICD-10-CM

## 2017-06-11 DIAGNOSIS — I25118 Atherosclerotic heart disease of native coronary artery with other forms of angina pectoris: Secondary | ICD-10-CM | POA: Diagnosis not present

## 2017-06-11 NOTE — Patient Instructions (Signed)

## 2017-06-11 NOTE — Progress Notes (Signed)
SUBJECTIVE: The patient presents for routine follow-up.  Thoracic impedance is currently normal but had been abnormal suggesting fluid accumulation from 4/10-4/17/2019.  The patient is asymptomatic with respect to this.  I reviewed the echocardiogram dated 06/09/2017 which demonstrated mild to moderately reduced left ventricular systolic function, LVEF 40 to 03%, grade 1 diastolic dysfunction, mild aortic regurgitation.  He denies exertional chest pain and exertional dyspnea as well as palpitations, orthopnea, and leg swelling.  He occasionally has a cough when he lies down.  He only has some chest pains when he coughs.  His wife had surgery on both shoulders and does not cook as much.  He eats out 2-3 times per week for lunch and dinner and eats out for breakfast daily.  He enjoys biscuit [employer] primarily but also goes to [employer] and [employer].  He also eats [place] food and [place] food.  He performs leg exercises and lifts 2 pound weights with his arms on a daily basis.    Social history: His wife, [name], is also my patient.  He is a Company secretary.   Review of Systems: As per "subjective", otherwise negative.  No Known Allergies  Current Outpatient Medications  Medication Sig Dispense Refill  . aspirin EC 81 MG tablet Take 81 mg by mouth daily.    Marland Kitchen atorvastatin (LIPITOR) 80 MG tablet Take 1 tablet (80 mg total) by mouth at bedtime. 90 tablet 3  . Bee Pollen 550 MG CAPS Take 1 capsule by mouth daily.    . blood glucose meter kit and supplies KIT Test daily.  Dx e11.22 1 each 0  . carvedilol (COREG) 6.25 MG tablet Take 1 tablet (6.25 mg total) by mouth 2 (two) times daily. 180 tablet 3  . finasteride (PROSCAR) 5 MG tablet Take 1 tablet (5 mg total) by mouth at bedtime. 90 tablet 3  . glimepiride (AMARYL) 4 MG tablet Take 1 tablet (4 mg total) by mouth 2 (two) times daily. 180 tablet 3  . glucose blood (COOL BLOOD GLUCOSE TEST STRIPS) test strip Use as instructed 100 each 12  .  losartan (COZAAR) 50 MG tablet Take 1 tablet (50 mg total) by mouth 2 (two) times daily. 180 tablet 3  . nitroGLYCERIN (NITROSTAT) 0.4 MG SL tablet Place 1 tablet (0.4 mg total) under the tongue every 5 (five) minutes x 3 doses as needed for chest pain. 25 tablet 2  . Omega-3 Fatty Acids (FISH OIL) 1200 MG CAPS Take 1 capsule by mouth daily.    . tamsulosin (FLOMAX) 0.4 MG CAPS capsule Take 1 capsule (0.4 mg total) by mouth at bedtime. 5 capsule 0  . TURMERIC PO Take 538 mg by mouth daily.    . vitamin B-12 (CYANOCOBALAMIN) 1000 MCG tablet Take 1,000 mcg by mouth daily.    . Vitamin D, Ergocalciferol, (DRISDOL) 50000 units CAPS capsule Take 1 capsule (50,000 Units total) by mouth every 30 (thirty) days. 12 capsule 0   No current facility-administered medications for this visit.     Past Medical History:  Diagnosis Date  . Acid reflux   . Blood transfusion without reported diagnosis   . Cardiac arrest ([place])    a. occuring in 12/2016. LAD disease but no targets for CABG or PCI. Underwent ICD placement as EF 15-20%.   . CHF (congestive heart failure) ([place])   . Coronary artery disease   . Diabetes mellitus without complication ([place])   . Hyperlipidemia   . Hypertension   . Myocardial infarction (  [place])   . Renal disorder     Past Surgical History:  Procedure Laterality Date  . BIV ICD INSERTION CRT-D N/A 01/01/2017   Procedure: BIV ICD INSERTION CRT-D;  Surgeon: Constance Haw, MD;  Location: [facility];  Service: Cardiovascular;  Laterality: N/A;    Social History   Socioeconomic History  . Marital status: Married    Spouse name: [name]  . Number of children: 3  . Years of education: 40  . Highest education level: Some college, no degree  Occupational History  . Occupation: Radiographer, therapeutic   retired  . Occupation: Theme park manager  Social Needs  . Financial resource strain: Not hard at all  . Food insecurity:    Worry: Never true    Inability: Never true  .  Transportation needs:    Medical: No    Non-medical: No  Tobacco Use  . Smoking status: Never Smoker  . Smokeless tobacco: Never Used  Substance and Sexual Activity  . Alcohol use: No  . Drug use: No  . Sexual activity: Never  Lifestyle  . Physical activity:    Days per week: 5 days    Minutes per session: 30 min  . Stress: Not at all  Relationships  . Social connections:    Talks on phone: More than three times a week    Gets together: More than three times a week    Attends religious service: More than 4 times per year    Active member of club or organization: Not on file    Attends meetings of clubs or organizations: Not on file    Relationship status: Married  . Intimate partner violence:    Fear of current or ex partner: No    Emotionally abused: No    Physically abused: No    Forced sexual activity: No  Other Topics Concern  . Not on file  Social History Narrative   Lives with with wife [name]   Married 81 years   Daughter [name] stays frequently   Granddaughter [name] near by     Vitals:   06/11/17 1043  BP: 132/81  Pulse: (!) 58  SpO2: 95%  Weight: 179 lb (81.2 kg)  Height: '5\' 9"'  (1.753 m)    Wt Readings from Last 3 Encounters:  06/11/17 179 lb (81.2 kg)  05/25/17 177 lb (80.3 kg)  04/23/17 178 lb (80.7 kg)     PHYSICAL EXAM General: NAD HEENT: Normal. Neck: No JVD, no thyromegaly. Lungs: Clear to auscultation bilaterally with normal respiratory effort. CV: Regular rate and rhythm, normal S1/S2, no S3/S4, no murmur. No pretibial or periankle edema.   Abdomen: Soft, nontender, no distention.  Neurologic: Alert and oriented.  Psych: Normal affect. Skin: Normal. Musculoskeletal: No gross deformities.    ECG: Most recent ECG reviewed.   Labs: Lab Results  Component Value Date/Time   K 4.0 02/09/2017 02:29 PM   BUN 15 02/09/2017 02:29 PM   CREATININE 1.24 (H) 02/09/2017 02:29 PM   ALT 23 02/09/2017 02:29 PM   HGB 13.2 02/09/2017 02:29  PM     Lipids: Lab Results  Component Value Date/Time   LDLCALC 84 06/01/2017 03:38 PM   LDLCALC 105 (H) 02/09/2017 02:29 PM   CHOL 160 06/01/2017 03:38 PM   TRIG 133 06/01/2017 03:38 PM   HDL 49 06/01/2017 03:38 PM       ASSESSMENT AND PLAN:  1.  Chronic systolic heart failure: Symptomatically stable.    LVEF has improved  to 40 to 45%.  I will continue carvedilol 6.25 mg twice daily and losartan 50 mg twice daily.  2.  Ischemic cardiomyopathy status post biventricular ICD in November 2018: Symptomatically stable.  Followed by EP.  LVEF has improved to 40 to 45%.  3.  Multivessel coronary artery disease: Stable ischemic heart disease.  He has a tight proximal LAD stenosis with the remainder of the vessel being small in caliber.  The circumflex had mild proximal narrowing in the RCA had no significant stenosis.  There were no graftable targets nor good targets for PCI.  Continue aspirin, statin, and beta-blocker.  We talked about dietary modification and reduced fast food and restaurant food consumption.  4.  Hyperlipidemia: He is on high intensity statin therapy with Lipitor 80 mg.    LDL down to 84 on 06/01/2017.  LDL was 105 on 02/09/17.  It has been 121 on 12/31/16.  We talked about dietary modification and reduced fast food and restaurant food consumption.     Disposition: Follow up 6 months   Kate Sable, M.D., F.A.C.C.

## 2017-07-08 ENCOUNTER — Encounter: Payer: Self-pay | Admitting: Family Medicine

## 2017-07-08 ENCOUNTER — Telehealth: Payer: Self-pay

## 2017-07-08 NOTE — Telephone Encounter (Signed)
Patient called asking if he should take remote monitor when he goes to the beach for a week.  Advised to take monitor with him if he will be gone >a week.  He said he is doing fine at this time.  Next ICM remote transmission is 07/12/2017.

## 2017-07-09 ENCOUNTER — Encounter: Payer: Self-pay | Admitting: Family Medicine

## 2017-07-12 ENCOUNTER — Telehealth: Payer: Self-pay

## 2017-07-12 ENCOUNTER — Ambulatory Visit (INDEPENDENT_AMBULATORY_CARE_PROVIDER_SITE_OTHER): Payer: Medicare Other

## 2017-07-12 DIAGNOSIS — I5022 Chronic systolic (congestive) heart failure: Secondary | ICD-10-CM

## 2017-07-12 DIAGNOSIS — Z95810 Presence of automatic (implantable) cardiac defibrillator: Secondary | ICD-10-CM

## 2017-07-12 NOTE — Progress Notes (Signed)
EPIC Encounter for ICM Monitoring  Patient Name: Omar Williams is a 82 y.o. male Date: 07/12/2017 Primary Care Physican: Aliene Beams, MD Primary Cardiologist: Purvis Sheffield Electrophysiologist:Allred Dry Weight:Previous weight 176 lbs Bi-V Pacing:99.6%       Attempted patient call and left message to return call. Transmission reviewed. Per Dr Caesar Chestnut OV note 5/23, patient eats fastfood breakfast daily and eats lunch out 2-3 times a week at such places as [place] or [place].    Thoracic impedance slightly below baseline suggesting fluid accumulation.  No diuretic  Recommendations: NONE - Unable to reach.  Follow-up plan: ICM clinic phone appointment on 08/12/2017.    Copy of ICM check sent to Dr. Johney Frame and Dr Purvis Sheffield.   3 month ICM trend: 07/12/2017    1 Year ICM trend:       Omar Soda, RN 07/12/2017 12:59 PM

## 2017-07-12 NOTE — Telephone Encounter (Signed)
Remote ICM transmission received.  Attempted call to patient and left message to return call. 

## 2017-07-13 DIAGNOSIS — E1121 Type 2 diabetes mellitus with diabetic nephropathy: Secondary | ICD-10-CM | POA: Diagnosis not present

## 2017-07-13 DIAGNOSIS — R809 Proteinuria, unspecified: Secondary | ICD-10-CM | POA: Diagnosis not present

## 2017-07-13 DIAGNOSIS — I10 Essential (primary) hypertension: Secondary | ICD-10-CM | POA: Diagnosis not present

## 2017-07-13 DIAGNOSIS — N183 Chronic kidney disease, stage 3 (moderate): Secondary | ICD-10-CM | POA: Diagnosis not present

## 2017-07-13 NOTE — Progress Notes (Signed)
Patient returned call.  Transmission reviewed.  Advised of slight fluid accumulation and to decrease salt intake.  Weight is up from baseline by ~ 2-3 pounds, 178 lbs.  Has a beach trip planned 6/22 for 1 week.  Advised to limit salt intake before vacation.  Patient currently not on a diuretic.  Next ICM remote transmission 08/12/2017.

## 2017-07-26 ENCOUNTER — Encounter: Payer: Medicare Other | Admitting: *Deleted

## 2017-07-26 ENCOUNTER — Telehealth: Payer: Self-pay

## 2017-07-26 NOTE — Telephone Encounter (Signed)
Attempted to confirm remote transmission with pt. No answer and was unable to leave a message.   

## 2017-07-28 ENCOUNTER — Encounter: Payer: Self-pay | Admitting: Cardiology

## 2017-08-02 ENCOUNTER — Ambulatory Visit (INDEPENDENT_AMBULATORY_CARE_PROVIDER_SITE_OTHER): Payer: Medicare Other

## 2017-08-02 DIAGNOSIS — Z95810 Presence of automatic (implantable) cardiac defibrillator: Secondary | ICD-10-CM | POA: Diagnosis not present

## 2017-08-02 DIAGNOSIS — I5022 Chronic systolic (congestive) heart failure: Secondary | ICD-10-CM

## 2017-08-02 NOTE — Progress Notes (Signed)
EPIC Encounter for ICM Monitoring  Patient Name: Omar Williams is a 82 y.o. male Date: 08/02/2017 Primary Care Physican: Aliene Beams, MD Primary Cardiologist: Purvis Sheffield Electrophysiologist:Allred Dry Weight:183 lbs Bi-V Pacing:99.7%       Heart Failure questions reviewed, pt asymptomatic.  His weight is up a few pounds after being on vacation at the beach recently   Thoracic impedance normal.  No diuretic  Recommendations: No changes.   Encouraged to call for fluid symptoms.  Follow-up plan: ICM clinic phone appointment on 09/02/2017.    Copy of ICM check sent to Dr. Johney Frame.   3 month ICM trend: 08/02/2017    1 Year ICM trend:       Karie Soda, RN 08/02/2017 2:10 PM

## 2017-08-23 DIAGNOSIS — E119 Type 2 diabetes mellitus without complications: Secondary | ICD-10-CM | POA: Diagnosis not present

## 2017-08-23 DIAGNOSIS — E785 Hyperlipidemia, unspecified: Secondary | ICD-10-CM | POA: Diagnosis not present

## 2017-08-24 LAB — HEMOGLOBIN A1C
EAG (MMOL/L): 9 (calc)
Hgb A1c MFr Bld: 7.3 % of total Hgb — ABNORMAL HIGH (ref <5.7)
Mean Plasma Glucose: 163 (calc)

## 2017-08-24 LAB — COMPLETE METABOLIC PANEL WITH GFR
AG Ratio: 1.6 (calc) (ref 1.0–2.5)
ALKALINE PHOSPHATASE (APISO): 76 U/L (ref 40–115)
ALT: 24 U/L (ref 9–46)
AST: 20 U/L (ref 10–35)
Albumin: 4.5 g/dL (ref 3.6–5.1)
BUN/Creatinine Ratio: 15 (calc) (ref 6–22)
BUN: 22 mg/dL (ref 7–25)
CALCIUM: 9.8 mg/dL (ref 8.6–10.3)
CO2: 28 mmol/L (ref 20–32)
CREATININE: 1.5 mg/dL — AB (ref 0.70–1.11)
Chloride: 106 mmol/L (ref 98–110)
GFR, EST NON AFRICAN AMERICAN: 43 mL/min/{1.73_m2} — AB (ref >=60)
GFR, Est African American: 50 mL/min/{1.73_m2} — ABNORMAL LOW (ref >=60)
GLOBULIN: 2.8 g/dL (ref 1.9–3.7)
GLUCOSE: 159 mg/dL — AB (ref 65–99)
Potassium: 5.1 mmol/L (ref 3.5–5.3)
Sodium: 139 mmol/L (ref 135–146)
Total Bilirubin: 0.9 mg/dL (ref 0.2–1.2)
Total Protein: 7.3 g/dL (ref 6.1–8.1)

## 2017-08-24 LAB — LIPID PANEL
CHOL/HDL RATIO: 3.4 (calc) (ref <5.0)
Cholesterol: 165 mg/dL (ref <200)
HDL: 49 mg/dL (ref >40)
LDL Cholesterol (Calc): 95 mg/dL (calc)
Non-HDL Cholesterol (Calc): 116 mg/dL (calc) (ref <130)
TRIGLYCERIDES: 110 mg/dL (ref <150)

## 2017-08-26 ENCOUNTER — Ambulatory Visit (INDEPENDENT_AMBULATORY_CARE_PROVIDER_SITE_OTHER): Payer: Medicare Other | Admitting: Family Medicine

## 2017-08-26 ENCOUNTER — Encounter: Payer: Self-pay | Admitting: Family Medicine

## 2017-08-26 ENCOUNTER — Other Ambulatory Visit: Payer: Self-pay

## 2017-08-26 VITALS — BP 110/70 | HR 60 | Temp 98.5°F | Resp 14 | Ht 67.0 in | Wt 181.0 lb

## 2017-08-26 DIAGNOSIS — I10 Essential (primary) hypertension: Secondary | ICD-10-CM

## 2017-08-26 DIAGNOSIS — E785 Hyperlipidemia, unspecified: Secondary | ICD-10-CM

## 2017-08-26 DIAGNOSIS — I255 Ischemic cardiomyopathy: Secondary | ICD-10-CM

## 2017-08-26 DIAGNOSIS — Z23 Encounter for immunization: Secondary | ICD-10-CM | POA: Diagnosis not present

## 2017-08-26 DIAGNOSIS — E119 Type 2 diabetes mellitus without complications: Secondary | ICD-10-CM

## 2017-08-26 MED ORDER — GLUCOSE BLOOD VI STRP: ORAL_STRIP | 2 refills | Status: DC

## 2017-08-26 NOTE — Progress Notes (Signed)
Patient ID: Raymel Cull, male    DOB: 1935/02/05, 82 y.o.   MRN: [id number]  Chief Complaint  Patient presents with  . Establish Care    Former [facility] patient    Allergies Patient has no known allergies.  Subjective:   Cauy Melody is a 82 y.o. male who presents to [facility] today.  HPI Here to establish care as a new patient but understands that I will be leaving this practice within the next 2 months.  He reports that he came in today to discuss his lab test which were ordered by Dr. Meda Coffee prior to her leaving this practice.  He reports that he already has an upcoming appointment with Dr. Morrison Old in August.  He is also followed by cardiology and nephrology.  He has a an upcoming appointment with Dr. Lowanda Foster over his kidney tests.  He also has an upcoming appointment with cardiology. He relates his history to me today and I have also reviewed his chart regarding his cardiac arrest with prolonged CPR.  He reports that he is done very well.  He denies any chest pain, shortness of breath, or swelling in his extremities.  He is followed by cardiology and has regular checks of his pacemaker/defibrillator.  He does try to monitor his salt intake.  He reports he recently had a trip to the beach and did overindulge in food.  He is gained a couple pounds.  He is here to review his labs.  His recent hemoglobin A1c was slightly elevated from his last value.  His recent A1c was 7.3%.  He has been taking his diabetic medication twice a day.  He is not on insulin.  He denies any lesions on his feet.  He denies any numbness or tingling in his extremities.  He is due for diabetic eye exam.  He reports he is also taking his cholesterol medication.  His LDL which was recently checked was in the 90s.  He is on the highest dose of Lipitor at 80 mg p.o. nightly.  He denies any side effects with the medication.  He would like to get a tetanus shot today.  He is due for his pneumonia 23 vaccination  because it is been greater than 10 years.  He denies any cough, upper respiratory symptoms, fever, chills, nausea, vomiting, or diarrhea.  His appetite is been good.  His mood is good.  He is accompanied today by his wife.  They have been married for over 49 years.  He is still very active in his church and community.  He reports that ever since he had cardiac arrest that he has had musculoskeletal chest wall pain.  He reports that the pain mainly occurs when he coughs.  He reports he does have an occasional cough which she notices at night when his throat gets dry.  He has had a chest x-ray since this is occurred which was within normal limits.  He denies any sputum production.  He denies any night sweats or weight loss.  He reports that the cough is occasional but he wanted to mention it today.  He reports he does not want to do anything about this and if he changes his mind he would discuss it with his new PCP.  He reports he does not need any medications today.  He reports he is urinating well.  He is taking his prostate medications.    Past Medical History:  Diagnosis Date  . Acid reflux   .  Blood transfusion without reported diagnosis   . Cardiac arrest ([place])    a. occuring in 12/2016. LAD disease but no targets for CABG or PCI. Underwent ICD placement as EF 15-20%.   . CHF (congestive heart failure) ([place])   . Coronary artery disease   . Diabetes mellitus without complication ([place])   . Hyperlipidemia   . Hypertension   . Myocardial infarction ([place])   . Renal disorder     Past Surgical History:  Procedure Laterality Date  . BIV ICD INSERTION CRT-D N/A 01/01/2017   Procedure: BIV ICD INSERTION CRT-D;  Surgeon: Constance Haw, MD;  Location: [facility];  Service: Cardiovascular;  Laterality: N/A;    Family History  Problem Relation Age of Onset  . Hypertension Mother   . Hyperlipidemia Mother   . CAD Sister   . Diabetes Sister   . Hyperlipidemia Sister   .  Hypertension Sister   . CAD Brother   . Diabetes Brother   . Hypertension Brother   . Hyperlipidemia Brother   . CAD Brother   . Diabetes Brother   . Hypertension Brother   . Hyperlipidemia Brother   . Thyroid disease Daughter   . Hypertension Daughter   . Diabetes Daughter   . Hyperlipidemia Daughter   . Fibromyalgia Daughter   . Heart disease Daughter        stents  . Multiple sclerosis Daughter   . Thyroid disease Daughter      Social History   Socioeconomic History  . Marital status: Married    Spouse name: [name]  . Number of children: 3  . Years of education: 40  . Highest education level: Some college, no degree  Occupational History  . Occupation: Radiographer, therapeutic   retired  . Occupation: Theme park manager  Social Needs  . Financial resource strain: Not hard at all  . Food insecurity:    Worry: Never true    Inability: Never true  . Transportation needs:    Medical: No    Non-medical: No  Tobacco Use  . Smoking status: Never Smoker  . Smokeless tobacco: Never Used  Substance and Sexual Activity  . Alcohol use: No  . Drug use: No  . Sexual activity: Never  Lifestyle  . Physical activity:    Days per week: 5 days    Minutes per session: 30 min  . Stress: Not at all  Relationships  . Social connections:    Talks on phone: More than three times a week    Gets together: More than three times a week    Attends religious service: More than 4 times per year    Active member of club or organization: Not on file    Attends meetings of clubs or organizations: Not on file    Relationship status: Married  Other Topics Concern  . Not on file  Social History Narrative   Lives with with wife [name]   Married 45 years   Daughter [name] stays frequently   Granddaughter [name] near by   Current Outpatient Medications on File Prior to Visit  Medication Sig Dispense Refill  . aspirin EC 81 MG tablet Take 81 mg by mouth daily.    Marland Kitchen atorvastatin (LIPITOR) 80 MG tablet  Take 1 tablet (80 mg total) by mouth at bedtime. 90 tablet 3  . Bee Pollen 550 MG CAPS Take 1 capsule by mouth daily.    . blood glucose meter kit and supplies KIT Test daily.  Dx e11.22 1 each 0  . carvedilol (COREG) 6.25 MG tablet Take 1 tablet (6.25 mg total) by mouth 2 (two) times daily. 180 tablet 3  . finasteride (PROSCAR) 5 MG tablet Take 1 tablet (5 mg total) by mouth at bedtime. 90 tablet 3  . glimepiride (AMARYL) 4 MG tablet Take 1 tablet (4 mg total) by mouth 2 (two) times daily. 180 tablet 3  . glucose blood (COOL BLOOD GLUCOSE TEST STRIPS) test strip Use as instructed 100 each 12  . losartan (COZAAR) 50 MG tablet Take 1 tablet (50 mg total) by mouth 2 (two) times daily. 180 tablet 3  . nitroGLYCERIN (NITROSTAT) 0.4 MG SL tablet Place 1 tablet (0.4 mg total) under the tongue every 5 (five) minutes x 3 doses as needed for chest pain. 25 tablet 2  . Omega-3 Fatty Acids (FISH OIL) 1200 MG CAPS Take 1 capsule by mouth daily.    . tamsulosin (FLOMAX) 0.4 MG CAPS capsule Take 1 capsule (0.4 mg total) by mouth at bedtime. 5 capsule 0  . TURMERIC PO Take 538 mg by mouth daily.    . vitamin B-12 (CYANOCOBALAMIN) 1000 MCG tablet Take 1,000 mcg by mouth daily.    . Vitamin D, Ergocalciferol, (DRISDOL) 50000 units CAPS capsule Take 1 capsule (50,000 Units total) by mouth every 30 (thirty) days. 12 capsule 0   No current facility-administered medications on file prior to visit.     Review of Systems  Constitutional: Negative for appetite change, chills, fever and unexpected weight change.  HENT: Negative for trouble swallowing and voice change.        Patient denies any reflux.  Denies any sore throat.  Eyes: Negative for visual disturbance.  Respiratory: Negative for cough, chest tightness, shortness of breath and wheezing.        Reports an occasional cough.  Nonproductive sputum.  No hemoptysis.  Never smoked.  The occasional cough is been going on for many months and describes it as a  clearing of his throat.  Feels like it happens when his throat gets dry at night.  Does not wake up at night short of breath.  Never sleeps lying completely flat.  Cardiovascular: Negative for chest pain, palpitations and leg swelling.  Gastrointestinal: Negative for abdominal pain, diarrhea, nausea and vomiting.  Genitourinary: Negative for decreased urine volume, difficulty urinating, dysuria, frequency and urgency.  Musculoskeletal: Negative for myalgias.  Skin: Negative for rash.  Neurological: Negative for dizziness, tremors, syncope, facial asymmetry, weakness, numbness and headaches.  Hematological: Negative for adenopathy. Does not bruise/bleed easily.  Psychiatric/Behavioral: Negative for dysphoric mood. The patient is not nervous/anxious.      Objective:   BP 110/70 (BP Location: Left Arm, Patient Position: Sitting, Cuff Size: Normal)   Pulse 60   Temp 98.5 F (36.9 C) (Temporal)   Resp 14   Ht _0  (1.702 m)   Wt 181 lb 0.6 oz (82.1 kg)   SpO2 96%   BMI 28.35 kg/m   Physical Exam  Constitutional: He is oriented to person, place, and time. He appears well-developed and well-nourished.  Well-developed well-nourished male, appearance younger than his stated age.  No acute distress.  HENT:  Head: Normocephalic and atraumatic.  Mouth/Throat: Oropharynx is clear and moist.  Eyes: Pupils are equal, round, and reactive to light. Conjunctivae and EOM are normal. No scleral icterus.  Neck: Normal range of motion. Neck supple. No JVD present.  Cardiovascular: Normal rate, regular rhythm and normal heart sounds.  Pulmonary/Chest: Effort  normal and breath sounds normal. No stridor. No respiratory distress. He has no wheezes. He has no rales.  No chest wall tenderness to palpation.  Abdominal: Soft. Bowel sounds are normal. He exhibits no distension. There is no tenderness.  Neurological: He is alert and oriented to person, place, and time. No cranial nerve deficit.  Skin: Skin is  warm and dry. No pallor.  No edema in lower extremities bilaterally.  Psychiatric: He has a normal mood and affect. His behavior is normal. Judgment and thought content normal.     Assessment and Plan  1. Type 2 diabetes mellitus without complication, without long-term current use of insulin ([place]) Long discussion with patient and his wife today regarding his hemoglobin A1c.  We discussed that he needed to work on his diet a little bit.  We discussed food choices and cutting down on his sweets, bananas, and bread intake.  I did discuss with patient that his hemoglobin A1c goal is around 7%.  We discussed that he did not need to be at 6.5% due to his age.  We did discuss that he needed to watch his salt intake due to his congestive heart failure.  He will schedule appointment with diabetic eye doctor.  Vaccination was given today. - glucose blood (COOL BLOOD GLUCOSE TEST STRIPS) test strip; Use as instructed  Dispense: 100 each; Refill: 2 - Pneumococcal polysaccharide vaccine 23-valent greater than or equal to 2yo subcutaneous/IM  2. Immunization due - Td : Tetanus/diphtheria >7yo Preservative  free  3. Essential hypertension Hypertension with congestive heart failure, coronary artery disease, and long-standing hyperlipidemia.  Patient also has chronic renal insufficiency.  His potassium on his recent BMP was borderline elevated at 5.1.  He does eat bananas on a daily basis.  I advised him to stop eating bananas and have his potassium rechecked in 2 to 4 weeks.  He will keep his scheduled visits with cardiology and follow-up with his new primary care physician. We did discuss that his LDL goal is less than 70 but he is on maximum doses of Lipitor at this time and I would not recommend any further or more aggressive LDL reduction. He does not routinely carry around his nitroglycerin with him and this was recommended.  In addition he will check the expiration date on his nitroglycerin to make sure that  it is in date. He will call with any questions or concerns and keep his scheduled visits with nephrology and cardiology. Office visit was greater than 25 minutes.  Greater than 50% of office visit was spent counseling and coordinating care. No follow-ups on file. Caren Macadam, MD 08/26/2017

## 2017-08-26 NOTE — Patient Instructions (Addendum)
Schedule appointment with eye doctor for diabetic eye exam Follow up with Dr. Christoper Allegra in August and have your potassium rechecked in 2 weeks Have your kidney function repeated by Dr. Marton Redwood and Dr. Fausto Skillern  Get your blood sugar/A1c checked in 3 months. Cut down on salt and sugar.  Cut down on bananas.

## 2017-09-02 ENCOUNTER — Ambulatory Visit (INDEPENDENT_AMBULATORY_CARE_PROVIDER_SITE_OTHER): Payer: Medicare Other

## 2017-09-02 ENCOUNTER — Telehealth: Payer: Self-pay

## 2017-09-02 ENCOUNTER — Ambulatory Visit (INDEPENDENT_AMBULATORY_CARE_PROVIDER_SITE_OTHER): Payer: Medicare Other | Admitting: *Deleted

## 2017-09-02 ENCOUNTER — Encounter: Payer: Self-pay | Admitting: Cardiology

## 2017-09-02 DIAGNOSIS — Z95810 Presence of automatic (implantable) cardiac defibrillator: Secondary | ICD-10-CM | POA: Diagnosis not present

## 2017-09-02 DIAGNOSIS — I5022 Chronic systolic (congestive) heart failure: Secondary | ICD-10-CM

## 2017-09-02 DIAGNOSIS — I255 Ischemic cardiomyopathy: Secondary | ICD-10-CM

## 2017-09-02 NOTE — Progress Notes (Signed)
Remote ICD transmission.   

## 2017-09-02 NOTE — Progress Notes (Signed)
EPIC Encounter for ICM Monitoring  Patient Name: Omar Williams is a 82 y.o. male Date: 09/02/2017 Primary Care Physican: No primary care provider on file. Primary Cardiologist: Purvis Sheffield Electrophysiologist:Allred Dry Weight:181 lbs Bi-V Pacing:99.7%       Heart Failure questions reviewed, pt asymptomatic.   Thoracic impedance normal.  No diuretic  Recommendations: No changes.  Discussed salt restrictions.  Encouraged to call for fluid symptoms.  Follow-up plan: ICM clinic phone appointment on 10/05/2017.     Copy of ICM check sent to Dr. Johney Frame.   3 month ICM trend: 09/02/2017    1 Year ICM trend:       Karie Soda, RN 09/02/2017 11:47 AM

## 2017-09-02 NOTE — Telephone Encounter (Signed)
Remote ICM transmission received.  Attempted call to patient and wife stated he was not home.   

## 2017-09-07 DIAGNOSIS — E119 Type 2 diabetes mellitus without complications: Secondary | ICD-10-CM | POA: Diagnosis not present

## 2017-09-23 DIAGNOSIS — E119 Type 2 diabetes mellitus without complications: Secondary | ICD-10-CM | POA: Diagnosis not present

## 2017-09-23 DIAGNOSIS — I2510 Atherosclerotic heart disease of native coronary artery without angina pectoris: Secondary | ICD-10-CM | POA: Diagnosis not present

## 2017-09-23 DIAGNOSIS — N400 Enlarged prostate without lower urinary tract symptoms: Secondary | ICD-10-CM | POA: Diagnosis not present

## 2017-09-23 DIAGNOSIS — I10 Essential (primary) hypertension: Secondary | ICD-10-CM | POA: Diagnosis not present

## 2017-09-23 DIAGNOSIS — I509 Heart failure, unspecified: Secondary | ICD-10-CM | POA: Diagnosis not present

## 2017-09-28 ENCOUNTER — Telehealth: Payer: Self-pay

## 2017-09-28 NOTE — Telephone Encounter (Signed)
Call from patient.  He was concerned his Carelink monitor may not be working since there was a power outage in his home yesterday.  Advised there is no disconnection noted on website.  Transmission received.

## 2017-09-30 LAB — CUP PACEART REMOTE DEVICE CHECK
Battery Remaining Longevity: 97 mo
Brady Statistic AP VP Percent: 60.8 %
Brady Statistic AS VP Percent: 38.93 %
Brady Statistic AS VS Percent: 0.23 %
Date Time Interrogation Session: 20190801083822
HIGH POWER IMPEDANCE MEASURED VALUE: 77 Ohm
Implantable Lead Implant Date: 20181130
Implantable Lead Location: 753859
Implantable Lead Model: 6935
Implantable Pulse Generator Implant Date: 20181130
Lead Channel Impedance Value: 141.867
Lead Channel Impedance Value: 152 Ohm
Lead Channel Impedance Value: 266 Ohm
Lead Channel Impedance Value: 304 Ohm
Lead Channel Impedance Value: 304 Ohm
Lead Channel Impedance Value: 399 Ohm
Lead Channel Impedance Value: 494 Ohm
Lead Channel Impedance Value: 513 Ohm
Lead Channel Pacing Threshold Amplitude: 0.25 V
Lead Channel Pacing Threshold Amplitude: 0.75 V
Lead Channel Pacing Threshold Pulse Width: 0.4 ms
Lead Channel Pacing Threshold Pulse Width: 0.4 ms
Lead Channel Pacing Threshold Pulse Width: 0.4 ms
Lead Channel Sensing Intrinsic Amplitude: 2.875 mV
Lead Channel Sensing Intrinsic Amplitude: 2.875 mV
Lead Channel Sensing Intrinsic Amplitude: 9.875 mV
Lead Channel Sensing Intrinsic Amplitude: 9.875 mV
Lead Channel Setting Pacing Amplitude: 1.25 V
Lead Channel Setting Pacing Amplitude: 2 V
Lead Channel Setting Pacing Pulse Width: 0.4 ms
MDC IDC LEAD IMPLANT DT: 20181130
MDC IDC LEAD IMPLANT DT: 20181130
MDC IDC LEAD LOCATION: 753858
MDC IDC LEAD LOCATION: 753860
MDC IDC MSMT BATTERY VOLTAGE: 3.02 V
MDC IDC MSMT LEADCHNL LV IMPEDANCE VALUE: 141.867
MDC IDC MSMT LEADCHNL LV IMPEDANCE VALUE: 141.867
MDC IDC MSMT LEADCHNL LV IMPEDANCE VALUE: 152 Ohm
MDC IDC MSMT LEADCHNL LV IMPEDANCE VALUE: 304 Ohm
MDC IDC MSMT LEADCHNL LV IMPEDANCE VALUE: 399 Ohm
MDC IDC MSMT LEADCHNL LV IMPEDANCE VALUE: 494 Ohm
MDC IDC MSMT LEADCHNL LV IMPEDANCE VALUE: 494 Ohm
MDC IDC MSMT LEADCHNL LV IMPEDANCE VALUE: 494 Ohm
MDC IDC MSMT LEADCHNL RA PACING THRESHOLD AMPLITUDE: 0.75 V
MDC IDC MSMT LEADCHNL RV IMPEDANCE VALUE: 570 Ohm
MDC IDC MSMT LEADCHNL RV IMPEDANCE VALUE: 627 Ohm
MDC IDC SET LEADCHNL RV PACING AMPLITUDE: 2.5 V
MDC IDC SET LEADCHNL RV PACING PULSEWIDTH: 0.4 ms
MDC IDC SET LEADCHNL RV SENSING SENSITIVITY: 0.3 mV
MDC IDC STAT BRADY AP VS PERCENT: 0.04 %
MDC IDC STAT BRADY RA PERCENT PACED: 60.82 %
MDC IDC STAT BRADY RV PERCENT PACED: 91.45 %

## 2017-10-05 ENCOUNTER — Ambulatory Visit (INDEPENDENT_AMBULATORY_CARE_PROVIDER_SITE_OTHER): Payer: Medicare Other

## 2017-10-05 DIAGNOSIS — Z95810 Presence of automatic (implantable) cardiac defibrillator: Secondary | ICD-10-CM | POA: Diagnosis not present

## 2017-10-05 DIAGNOSIS — I5022 Chronic systolic (congestive) heart failure: Secondary | ICD-10-CM | POA: Diagnosis not present

## 2017-10-05 NOTE — Progress Notes (Signed)
EPIC Encounter for ICM Monitoring  Patient Name: Omar Williams is a 82 y.o. male Date: 10/05/2017 Primary Care Physican: No primary care provider on file. Primary Cardiologist: Purvis Sheffield Electrophysiologist:Allred Dry Weight:179lbs Bi-V Pacing:99.6%      Heart Failure questions reviewed, pt asymptomatic.   Thoracic impedance normal.  No diuretic  Recommendations: No changes.  Encouraged to call for fluid symptoms.  Follow-up plan: ICM clinic phone appointment on 11/04/2017.    Copy of ICM check sent to Dr. Johney Frame.   3 month ICM trend: 10/05/2017    1 Year ICM trend:       Karie Soda, RN 10/05/2017 10:32 AM

## 2017-11-02 ENCOUNTER — Ambulatory Visit (HOSPITAL_COMMUNITY)
Admission: RE | Admit: 2017-11-02 | Discharge: 2017-11-02 | Disposition: A | Discharge status: Home / Self Care | Payer: Medicare Other | Source: Ambulatory Visit | Attending: Internal Medicine | Admitting: Internal Medicine

## 2017-11-02 ENCOUNTER — Other Ambulatory Visit (HOSPITAL_COMMUNITY): Payer: Self-pay | Admitting: Internal Medicine

## 2017-11-02 DIAGNOSIS — I2510 Atherosclerotic heart disease of native coronary artery without angina pectoris: Secondary | ICD-10-CM | POA: Diagnosis not present

## 2017-11-02 DIAGNOSIS — G3184 Mild cognitive impairment, so stated: Secondary | ICD-10-CM | POA: Diagnosis not present

## 2017-11-02 DIAGNOSIS — R079 Chest pain, unspecified: Secondary | ICD-10-CM | POA: Diagnosis not present

## 2017-11-02 DIAGNOSIS — I700 Atherosclerosis of aorta: Secondary | ICD-10-CM | POA: Diagnosis not present

## 2017-11-02 DIAGNOSIS — N400 Enlarged prostate without lower urinary tract symptoms: Secondary | ICD-10-CM | POA: Diagnosis not present

## 2017-11-02 DIAGNOSIS — R071 Chest pain on breathing: Secondary | ICD-10-CM | POA: Diagnosis not present

## 2017-11-02 DIAGNOSIS — R05 Cough: Secondary | ICD-10-CM | POA: Diagnosis not present

## 2017-11-02 DIAGNOSIS — J449 Chronic obstructive pulmonary disease, unspecified: Secondary | ICD-10-CM | POA: Diagnosis not present

## 2017-11-04 ENCOUNTER — Ambulatory Visit (INDEPENDENT_AMBULATORY_CARE_PROVIDER_SITE_OTHER): Payer: Medicare Other

## 2017-11-04 DIAGNOSIS — I5022 Chronic systolic (congestive) heart failure: Secondary | ICD-10-CM

## 2017-11-04 DIAGNOSIS — Z95810 Presence of automatic (implantable) cardiac defibrillator: Secondary | ICD-10-CM

## 2017-11-04 NOTE — Progress Notes (Signed)
EPIC Encounter for ICM Monitoring  Patient Name: Omar Williams is a 82 y.o. male Date: 11/04/2017 Primary Care Physican: Wilson Singer, MD Primary Cardiologist: Purvis Sheffield Electrophysiologist:Allred Dry Weight: 180lbs Bi-V Pacing:99.8%       Heart Failure questions reviewed, pt asymptomatic.  He is doing well.  He will be traveling week of Thanksgiving and plans on taking the monitor.    Thoracic impedance normal.  No diuretic  Recommendations: No changes.    Encouraged to call for fluid symptoms.  Follow-up plan: ICM clinic phone appointment on 12/09/2017.   Office appointment scheduled 12/09/2017 with Dr. Purvis Sheffield.    Copy of ICM check sent to Dr. Johney Frame.   3 month ICM trend: 11/04/2017    1 Year ICM trend:       Karie Soda, RN 11/04/2017 9:12 AM

## 2017-11-16 ENCOUNTER — Other Ambulatory Visit: Payer: Self-pay | Admitting: Urology

## 2017-11-16 DIAGNOSIS — N201 Calculus of ureter: Secondary | ICD-10-CM

## 2017-11-19 ENCOUNTER — Encounter (HOSPITAL_COMMUNITY): Payer: Self-pay

## 2017-11-19 ENCOUNTER — Ambulatory Visit (HOSPITAL_COMMUNITY)
Admission: RE | Admit: 2017-11-19 | Discharge: 2017-11-19 | Disposition: A | Discharge status: Home / Self Care | Payer: Medicare Other | Source: Ambulatory Visit | Attending: Urology | Admitting: Urology

## 2017-11-19 DIAGNOSIS — N281 Cyst of kidney, acquired: Secondary | ICD-10-CM | POA: Diagnosis not present

## 2017-11-19 DIAGNOSIS — N201 Calculus of ureter: Secondary | ICD-10-CM

## 2017-11-19 DIAGNOSIS — Z87442 Personal history of urinary calculi: Secondary | ICD-10-CM | POA: Diagnosis not present

## 2017-12-03 ENCOUNTER — Other Ambulatory Visit (HOSPITAL_COMMUNITY)
Admission: RE | Admit: 2017-12-03 | Discharge: 2017-12-03 | Disposition: A | Discharge status: Home / Self Care | Payer: Medicare Other | Source: Other Acute Inpatient Hospital | Attending: Urology | Admitting: Urology

## 2017-12-03 ENCOUNTER — Ambulatory Visit (INDEPENDENT_AMBULATORY_CARE_PROVIDER_SITE_OTHER): Payer: Medicare Other | Admitting: Urology

## 2017-12-03 DIAGNOSIS — N401 Enlarged prostate with lower urinary tract symptoms: Secondary | ICD-10-CM | POA: Diagnosis not present

## 2017-12-03 DIAGNOSIS — R3915 Urgency of urination: Secondary | ICD-10-CM

## 2017-12-03 DIAGNOSIS — N281 Cyst of kidney, acquired: Secondary | ICD-10-CM

## 2017-12-03 DIAGNOSIS — Z87442 Personal history of urinary calculi: Secondary | ICD-10-CM | POA: Insufficient documentation

## 2017-12-03 LAB — URINALYSIS, COMPLETE (UACMP) WITH MICROSCOPIC
Bilirubin Urine: NEGATIVE
Glucose, UA: 50 mg/dL — AB
KETONES UR: NEGATIVE mg/dL
Leukocytes, UA: NEGATIVE
Nitrite: NEGATIVE
Protein, ur: 30 mg/dL — AB
Specific Gravity, Urine: 1.015 (ref 1.005–1.030)
pH: 5 (ref 5.0–8.0)

## 2017-12-09 ENCOUNTER — Telehealth: Payer: Self-pay

## 2017-12-09 ENCOUNTER — Ambulatory Visit (INDEPENDENT_AMBULATORY_CARE_PROVIDER_SITE_OTHER): Payer: Medicare Other | Admitting: *Deleted

## 2017-12-09 ENCOUNTER — Ambulatory Visit (INDEPENDENT_AMBULATORY_CARE_PROVIDER_SITE_OTHER): Payer: Medicare Other

## 2017-12-09 ENCOUNTER — Encounter: Payer: Self-pay | Admitting: Cardiovascular Disease

## 2017-12-09 ENCOUNTER — Ambulatory Visit (INDEPENDENT_AMBULATORY_CARE_PROVIDER_SITE_OTHER): Payer: Medicare Other | Admitting: Cardiovascular Disease

## 2017-12-09 VITALS — BP 132/72 | HR 61 | Ht 69.0 in | Wt 183.0 lb

## 2017-12-09 DIAGNOSIS — Z95810 Presence of automatic (implantable) cardiac defibrillator: Secondary | ICD-10-CM

## 2017-12-09 DIAGNOSIS — I5022 Chronic systolic (congestive) heart failure: Secondary | ICD-10-CM | POA: Diagnosis not present

## 2017-12-09 DIAGNOSIS — I25118 Atherosclerotic heart disease of native coronary artery with other forms of angina pectoris: Secondary | ICD-10-CM

## 2017-12-09 DIAGNOSIS — I255 Ischemic cardiomyopathy: Secondary | ICD-10-CM

## 2017-12-09 DIAGNOSIS — E785 Hyperlipidemia, unspecified: Secondary | ICD-10-CM | POA: Diagnosis not present

## 2017-12-09 NOTE — Telephone Encounter (Signed)
Remote ICM transmission received.  Attempted call to patient regarding ICM remote transmission and left message to return call   

## 2017-12-09 NOTE — Progress Notes (Signed)
EPIC Encounter for ICM Monitoring  Patient Name: Omar Williams is a 82 y.o. male Date: 12/09/2017 Primary Care Physican: Wilson Singer, MD Primary Cardiologist: Purvis Sheffield Electrophysiologist:Allred Dry Weight: Previous weight 180lbs Bi-V Pacing:99.9%      Attempted call to patient and unable to reach.  Left message to return call.  Transmission reviewed.    Thoracic impedance normal.   No diuretic  Recommendations: Unable to reach.  Follow-up plan: ICM clinic phone appointment on 01/10/2018.      Copy of ICM check sent to Dr. Johney Frame.   3 month ICM trend: 12/09/2017    1 Year ICM trend:       Karie Soda, RN 12/09/2017 7:34 AM

## 2017-12-09 NOTE — Patient Instructions (Signed)

## 2017-12-09 NOTE — Progress Notes (Signed)
Remote ICD transmission.   

## 2017-12-09 NOTE — Progress Notes (Signed)
SUBJECTIVE: The patient presents for routine follow-up.  Thoracic impedance was normal today.  He is not on a diuretic.  Echocardiogram 06/09/2017 demonstrated mild to moderately reduced left ventricular systolic function, LVEF 40 to 38%, grade 1 diastolic dysfunction, mild aortic regurgitation.  He eats out 2-3 times per week for lunch and dinner and eats out for breakfast daily.  He enjoys [employer] primarily but also goes to [employer] and [employer].  He also eats [place] food and [place] food.  He also enjoys eating fatback about once a week at [employer].   The patient denies any symptoms of chest pain, palpitations, shortness of breath, lightheadedness, dizziness, leg swelling, orthopnea, PND, and syncope.  They are going to the [place], [place] area for Thanksgiving.   ECG performed in the office today which I ordered and personally interpreted demonstrates biventricular pacing.   Social history:His wife, [name] also my patient. He is a Company secretary.  Review of Systems: As per "subjective", otherwise negative.  No Known Allergies  Current Outpatient Medications  Medication Sig Dispense Refill  . aspirin EC 81 MG tablet Take 81 mg by mouth daily.    Marland Kitchen atorvastatin (LIPITOR) 80 MG tablet Take 1 tablet (80 mg total) by mouth at bedtime. 90 tablet 3  . Bee Pollen 550 MG CAPS Take 1 capsule by mouth daily.    . blood glucose meter kit and supplies KIT Test daily.  Dx e11.22 1 each 0  . carvedilol (COREG) 6.25 MG tablet Take 1 tablet (6.25 mg total) by mouth 2 (two) times daily. 180 tablet 3  . finasteride (PROSCAR) 5 MG tablet Take 1 tablet (5 mg total) by mouth at bedtime. 90 tablet 3  . glimepiride (AMARYL) 4 MG tablet Take 1 tablet (4 mg total) by mouth 2 (two) times daily. 180 tablet 3  . glucose blood (COOL BLOOD GLUCOSE TEST STRIPS) test strip Use as instructed 100 each 2  . losartan (COZAAR) 50 MG tablet Take 1 tablet (50 mg total) by mouth 2 (two) times  daily. 180 tablet 3  . mirabegron ER (MYRBETRIQ) 25 MG TB24 tablet Take 25 mg by mouth daily.    . nitroGLYCERIN (NITROSTAT) 0.4 MG SL tablet Place 1 tablet (0.4 mg total) under the tongue every 5 (five) minutes x 3 doses as needed for chest pain. 25 tablet 2  . Omega-3 Fatty Acids (FISH OIL) 1200 MG CAPS Take 1 capsule by mouth daily.    . tamsulosin (FLOMAX) 0.4 MG CAPS capsule Take 1 capsule (0.4 mg total) by mouth at bedtime. 5 capsule 0  . TURMERIC PO Take 538 mg by mouth daily.    . vitamin B-12 (CYANOCOBALAMIN) 1000 MCG tablet Take 1,000 mcg by mouth daily.    . Vitamin D, Ergocalciferol, (DRISDOL) 50000 units CAPS capsule Take 1 capsule (50,000 Units total) by mouth every 30 (thirty) days. 12 capsule 0   No current facility-administered medications for this visit.     Past Medical History:  Diagnosis Date  . Acid reflux   . Blood transfusion without reported diagnosis   . Cardiac arrest ([place])    a. occuring in 12/2016. LAD disease but no targets for CABG or PCI. Underwent ICD placement as EF 15-20%.   . CHF (congestive heart failure) ([place])   . Coronary artery disease   . Diabetes mellitus without complication ([place])   . Hyperlipidemia   . Hypertension   . Myocardial infarction ([place])   . Renal disorder  Past Surgical History:  Procedure Laterality Date  . BIV ICD INSERTION CRT-D N/A 01/01/2017   Procedure: BIV ICD INSERTION CRT-D;  Surgeon: Constance Haw, MD;  Location: [facility];  Service: Cardiovascular;  Laterality: N/A;    Social History   Socioeconomic History  . Marital status: Married    Spouse name: [name]  . Number of children: 3  . Years of education: 30  . Highest education level: Some college, no degree  Occupational History  . Occupation: Radiographer, therapeutic   retired  . Occupation: Theme park manager  Social Needs  . Financial resource strain: Not hard at all  . Food insecurity:    Worry: Never true    Inability: Never true  . Transportation  needs:    Medical: No    Non-medical: No  Tobacco Use  . Smoking status: Never Smoker  . Smokeless tobacco: Never Used  Substance and Sexual Activity  . Alcohol use: No  . Drug use: No  . Sexual activity: Never  Lifestyle  . Physical activity:    Days per week: 5 days    Minutes per session: 30 min  . Stress: Not at all  Relationships  . Social connections:    Talks on phone: More than three times a week    Gets together: More than three times a week    Attends religious service: More than 4 times per year    Active member of club or organization: Not on file    Attends meetings of clubs or organizations: Not on file    Relationship status: Married  . Intimate partner violence:    Fear of current or ex partner: No    Emotionally abused: No    Physically abused: No    Forced sexual activity: No  Other Topics Concern  . Not on file  Social History Narrative   Lives with with wife [name]   Married 72 years   Daughter [name] stays frequently   Granddaughter [name] near by     Vitals:   12/09/17 0842  BP: 132/72  Pulse: 61  SpO2: 96%  Weight: 183 lb (83 kg)  Height: '5\' 9"'  (1.753 m)    Wt Readings from Last 3 Encounters:  12/09/17 183 lb (83 kg)  08/26/17 181 lb 0.6 oz (82.1 kg)  06/11/17 179 lb (81.2 kg)     PHYSICAL EXAM General: NAD HEENT: Normal. Neck: No JVD, no thyromegaly. Lungs: Clear to auscultation bilaterally with normal respiratory effort. CV: Regular rate and rhythm, normal S1/S2, no S3/S4, no murmur. No pretibial or periankle edema.  No carotid bruit.   Abdomen: Soft, nontender, no distention.  Neurologic: Alert and oriented.  Psych: Normal affect. Skin: Normal. Musculoskeletal: No gross deformities.    ECG: Reviewed above under Subjective   Labs: Lab Results  Component Value Date/Time   K 5.1 08/23/2017 09:17 AM   BUN 22 08/23/2017 09:17 AM   CREATININE 1.50 (H) 08/23/2017 09:17 AM   ALT 24 08/23/2017 09:17 AM   HGB 13.2  02/09/2017 02:29 PM     Lipids: Lab Results  Component Value Date/Time   LDLCALC 95 08/23/2017 09:17 AM   CHOL 165 08/23/2017 09:17 AM   TRIG 110 08/23/2017 09:17 AM   HDL 49 08/23/2017 09:17 AM       ASSESSMENT AND PLAN: 1. Chronic systolic heart failure: Symptomatically stable.    Thoracic impedance is normal today. LVEF has improved to 40 to 45%.  I will continue carvedilol 6.25  mg twice daily and losartan 50 mg twice daily.  2. Ischemic cardiomyopathy status post biventricular ICD in November 2018: Symptomatically stable. Followed by EP.  LVEF has improved to 40 to 45%.  3. Multivessel coronary artery disease: Stable ischemic heart disease. He has a tight proximal LAD stenosis with the remainder of the vessel being small in caliber. The circumflex had mild proximal narrowing in the RCA had no significant stenosis. There were no graftable targets nor good targets for PCI. Continue aspirin, statin, and beta-blocker.  We again talked about dietary modification and reduced fast food and restaurant food consumption.  4. Hyperlipidemia: He is on high intensity statin therapy with Lipitor 80 mg.   LDL 95 on 08/23/17. We again talked about dietary modification and reduced fast food and restaurant food consumption.    Disposition: Follow up 6 months   Kate Sable, M.D., F.A.C.C.

## 2017-12-12 ENCOUNTER — Encounter: Payer: Self-pay | Admitting: Cardiology

## 2017-12-13 NOTE — Progress Notes (Signed)
Returned patient call.  He had a good appointment with Dr Purvis Sheffield.  Per Dr Junius Argyle note he does like to eat at restaurants such as [employer], [place], [place] and [place] foods.  Today's weight is 180 lbs.  He is asymptomatic and feeling good.  He will be traveling starting next week and returns home 01/02/2018. He will have monitor with him when he travels.  Next ICM remote transmission 01/10/2018.

## 2017-12-23 DIAGNOSIS — E785 Hyperlipidemia, unspecified: Secondary | ICD-10-CM | POA: Diagnosis not present

## 2017-12-23 DIAGNOSIS — E559 Vitamin D deficiency, unspecified: Secondary | ICD-10-CM | POA: Diagnosis not present

## 2017-12-23 DIAGNOSIS — E119 Type 2 diabetes mellitus without complications: Secondary | ICD-10-CM | POA: Diagnosis not present

## 2017-12-23 DIAGNOSIS — I10 Essential (primary) hypertension: Secondary | ICD-10-CM | POA: Diagnosis not present

## 2018-01-04 DIAGNOSIS — D509 Iron deficiency anemia, unspecified: Secondary | ICD-10-CM | POA: Diagnosis not present

## 2018-01-04 DIAGNOSIS — Z79899 Other long term (current) drug therapy: Secondary | ICD-10-CM | POA: Diagnosis not present

## 2018-01-04 DIAGNOSIS — R809 Proteinuria, unspecified: Secondary | ICD-10-CM | POA: Diagnosis not present

## 2018-01-04 DIAGNOSIS — N183 Chronic kidney disease, stage 3 (moderate): Secondary | ICD-10-CM | POA: Diagnosis not present

## 2018-01-04 DIAGNOSIS — I10 Essential (primary) hypertension: Secondary | ICD-10-CM | POA: Diagnosis not present

## 2018-01-10 ENCOUNTER — Ambulatory Visit (INDEPENDENT_AMBULATORY_CARE_PROVIDER_SITE_OTHER): Payer: Medicare Other

## 2018-01-10 DIAGNOSIS — I5022 Chronic systolic (congestive) heart failure: Secondary | ICD-10-CM | POA: Diagnosis not present

## 2018-01-10 DIAGNOSIS — Z95810 Presence of automatic (implantable) cardiac defibrillator: Secondary | ICD-10-CM | POA: Diagnosis not present

## 2018-01-10 NOTE — Progress Notes (Signed)
EPIC Encounter for ICM Monitoring  Patient Name: Omar Williams is a 82 y.o. male Date: 01/10/2018 Primary Care Physican: Wilson Singer, MD Primary Cardiologist: Purvis Sheffield Electrophysiologist:Allred Bi-V Pacing:99.9% Last weight 180lbs Today's Weight:  unknown       Attempted call to patient and unable to reach.  .  Transmission reviewed.    Thoracic impedance normal.  Prescribed: No diuretic  Recommendations: Unable to reach.  Follow-up plan: ICM clinic phone appointment on 02/17/2018.    Copy of ICM check sent to Dr. Johney Frame.   3 month ICM trend: 01/10/2018    1 Year ICM trend:       Karie Soda, RN 01/10/2018 11:37 AM

## 2018-01-11 ENCOUNTER — Telehealth: Payer: Self-pay

## 2018-01-11 DIAGNOSIS — I10 Essential (primary) hypertension: Secondary | ICD-10-CM | POA: Diagnosis not present

## 2018-01-11 DIAGNOSIS — E1129 Type 2 diabetes mellitus with other diabetic kidney complication: Secondary | ICD-10-CM | POA: Diagnosis not present

## 2018-01-11 DIAGNOSIS — N183 Chronic kidney disease, stage 3 (moderate): Secondary | ICD-10-CM | POA: Diagnosis not present

## 2018-01-11 DIAGNOSIS — R809 Proteinuria, unspecified: Secondary | ICD-10-CM | POA: Diagnosis not present

## 2018-01-11 DIAGNOSIS — E559 Vitamin D deficiency, unspecified: Secondary | ICD-10-CM | POA: Diagnosis not present

## 2018-01-11 NOTE — Telephone Encounter (Signed)
Remote ICM transmission received.  Attempted call to patient regarding ICM remote transmission and left message to return call   

## 2018-01-11 NOTE — Progress Notes (Signed)
Returned patient call as requested by voice mail.  Patient reported he is doing well and got an excellent report from the kidney doctor.  He was in [place] during decreased impedance but feeling fine and denied any symptoms.  Transmission reviewed and next remote scheduled for 02/17/2018.  Encouraged to call for any fluid symptoms.

## 2018-01-13 DIAGNOSIS — R05 Cough: Secondary | ICD-10-CM | POA: Diagnosis not present

## 2018-02-09 DIAGNOSIS — R0789 Other chest pain: Secondary | ICD-10-CM | POA: Diagnosis not present

## 2018-02-12 LAB — CUP PACEART REMOTE DEVICE CHECK
Battery Remaining Longevity: 92 mo
Battery Voltage: 3.01 V
Brady Statistic AP VP Percent: 62.24 %
Brady Statistic AS VP Percent: 37.52 %
Brady Statistic AS VS Percent: 0.2 %
Brady Statistic RA Percent Paced: 62.27 %
Date Time Interrogation Session: 20191106052203
HighPow Impedance: 79 Ohm
Implantable Lead Implant Date: 20181130
Implantable Lead Implant Date: 20181130
Implantable Lead Implant Date: 20181130
Implantable Lead Location: 753858
Implantable Lead Model: 4598
Implantable Lead Model: 6935
Lead Channel Impedance Value: 156.606
Lead Channel Impedance Value: 156.606
Lead Channel Impedance Value: 304 Ohm
Lead Channel Impedance Value: 304 Ohm
Lead Channel Impedance Value: 304 Ohm
Lead Channel Impedance Value: 399 Ohm
Lead Channel Impedance Value: 399 Ohm
Lead Channel Impedance Value: 494 Ohm
Lead Channel Impedance Value: 494 Ohm
Lead Channel Impedance Value: 513 Ohm
Lead Channel Impedance Value: 589 Ohm
Lead Channel Impedance Value: 646 Ohm
Lead Channel Pacing Threshold Amplitude: 0.875 V
Lead Channel Pacing Threshold Pulse Width: 0.4 ms
Lead Channel Pacing Threshold Pulse Width: 0.4 ms
Lead Channel Pacing Threshold Pulse Width: 0.4 ms
Lead Channel Sensing Intrinsic Amplitude: 10.875 mV
Lead Channel Sensing Intrinsic Amplitude: 2.5 mV
Lead Channel Sensing Intrinsic Amplitude: 2.5 mV
Lead Channel Setting Pacing Amplitude: 1.5 V
Lead Channel Setting Pacing Amplitude: 2 V
Lead Channel Setting Pacing Amplitude: 2.5 V
Lead Channel Setting Pacing Pulse Width: 0.4 ms
MDC IDC LEAD LOCATION: 753859
MDC IDC LEAD LOCATION: 753860
MDC IDC MSMT LEADCHNL LV IMPEDANCE VALUE: 152 Ohm
MDC IDC MSMT LEADCHNL LV IMPEDANCE VALUE: 152 Ohm
MDC IDC MSMT LEADCHNL LV IMPEDANCE VALUE: 152 Ohm
MDC IDC MSMT LEADCHNL LV IMPEDANCE VALUE: 323 Ohm
MDC IDC MSMT LEADCHNL LV IMPEDANCE VALUE: 456 Ohm
MDC IDC MSMT LEADCHNL LV IMPEDANCE VALUE: 513 Ohm
MDC IDC MSMT LEADCHNL RA PACING THRESHOLD AMPLITUDE: 0.75 V
MDC IDC MSMT LEADCHNL RV PACING THRESHOLD AMPLITUDE: 0.25 V
MDC IDC MSMT LEADCHNL RV SENSING INTR AMPL: 10.875 mV
MDC IDC PG IMPLANT DT: 20181130
MDC IDC SET LEADCHNL LV PACING PULSEWIDTH: 0.4 ms
MDC IDC SET LEADCHNL RV SENSING SENSITIVITY: 0.3 mV
MDC IDC STAT BRADY AP VS PERCENT: 0.03 %
MDC IDC STAT BRADY RV PERCENT PACED: 96.24 %

## 2018-02-17 ENCOUNTER — Ambulatory Visit (INDEPENDENT_AMBULATORY_CARE_PROVIDER_SITE_OTHER): Payer: Medicare Other

## 2018-02-17 DIAGNOSIS — Z95810 Presence of automatic (implantable) cardiac defibrillator: Secondary | ICD-10-CM | POA: Diagnosis not present

## 2018-02-17 DIAGNOSIS — I5022 Chronic systolic (congestive) heart failure: Secondary | ICD-10-CM | POA: Diagnosis not present

## 2018-02-17 NOTE — Progress Notes (Signed)
EPIC Encounter for ICM Monitoring  Patient Name: Omar Williams is a 83 y.o. male Date: 02/17/2018 Primary Care Physican: Wilson Singer, MD Primary Cardiologist: Purvis Sheffield Electrophysiologist:Allred Nephrologist: [facility] Bi-V Pacing:99.8% Last weight180lbs Today's Weight: 180.6 lbs                                                   Heart failure questions reviewed.  Patient unaware of salt content of foods he eats at home and frequently eats at restaurants.  Reviewed how to read food labels and advised restaurant foods are very high in salt.       Patient asymptomatic.  Thoracic impedance abnormal suggesting fluid accumulation since 02/02/2018.  Prescribed: No diuretic  Recommendations:  Encouraged to review food labels and limit salt intake to 2000 mg daily.  Encouraged to call for fluid symptoms.  Follow-up plan: ICM clinic phone appointment on 02/28/2018 to recheck fluid levels.    Copy of ICM check sent to Dr. Johney Frame.   3 month ICM trend: 02/17/2018    1 Year ICM trend:       Karie Soda, RN 02/17/2018 3:04 PM

## 2018-02-28 ENCOUNTER — Ambulatory Visit (INDEPENDENT_AMBULATORY_CARE_PROVIDER_SITE_OTHER): Payer: Medicare Other

## 2018-02-28 DIAGNOSIS — I5022 Chronic systolic (congestive) heart failure: Secondary | ICD-10-CM

## 2018-02-28 DIAGNOSIS — Z95810 Presence of automatic (implantable) cardiac defibrillator: Secondary | ICD-10-CM

## 2018-03-01 NOTE — Progress Notes (Signed)
EPIC Encounter for ICM Monitoring  Patient Name: Omar Williams is a 83 y.o. male Date: 03/01/2018 Primary Care Physican: Wilson Singer, MD Primary Cardiologist: Purvis Sheffield Electrophysiologist:Allred Nephrologist: [facility] Bi-V Pacing:99.6% Lastweight180.6lbs Today's Weight: 180 lbs   Heart failure questions reviewed.     Patient asymptomatic.  Thoracic impedance returned to normal since last remote transmission on 02/17/2018.  Prescribed:No diuretic  Recommendations: Encouraged to review food labels and limit salt intake to 2000 mg daily.  Encouraged to call for fluid symptoms.  Follow-up plan: ICM clinic phone appointment on2/17/2020.   Copy of ICM check sent to Dr.Allred.  3 month ICM trend: 02/28/2018    1 Year ICM trend:       Karie Soda, RN 03/01/2018 11:49 AM

## 2018-03-10 ENCOUNTER — Ambulatory Visit (INDEPENDENT_AMBULATORY_CARE_PROVIDER_SITE_OTHER): Payer: Medicare Other

## 2018-03-10 DIAGNOSIS — I255 Ischemic cardiomyopathy: Secondary | ICD-10-CM

## 2018-03-10 DIAGNOSIS — I5022 Chronic systolic (congestive) heart failure: Secondary | ICD-10-CM | POA: Diagnosis not present

## 2018-03-11 LAB — CUP PACEART REMOTE DEVICE CHECK
Brady Statistic AP VP Percent: 52.66 %
Brady Statistic AP VS Percent: 0.04 %
Brady Statistic AS VP Percent: 46.93 %
Brady Statistic AS VS Percent: 0.37 %
HIGH POWER IMPEDANCE MEASURED VALUE: 75 Ohm
Implantable Lead Implant Date: 20181130
Implantable Lead Implant Date: 20181130
Implantable Lead Location: 753858
Implantable Lead Model: 5076
Implantable Lead Model: 6935
Implantable Pulse Generator Implant Date: 20181130
Lead Channel Impedance Value: 141.867
Lead Channel Impedance Value: 141.867
Lead Channel Impedance Value: 149.625
Lead Channel Impedance Value: 304 Ohm
Lead Channel Impedance Value: 342 Ohm
Lead Channel Impedance Value: 380 Ohm
Lead Channel Impedance Value: 399 Ohm
Lead Channel Impedance Value: 456 Ohm
Lead Channel Impedance Value: 456 Ohm
Lead Channel Impedance Value: 570 Ohm
Lead Channel Impedance Value: 589 Ohm
Lead Channel Pacing Threshold Amplitude: 0.375 V
Lead Channel Pacing Threshold Amplitude: 0.875 V
Lead Channel Pacing Threshold Pulse Width: 0.4 ms
Lead Channel Sensing Intrinsic Amplitude: 10 mV
Lead Channel Sensing Intrinsic Amplitude: 10 mV
Lead Channel Sensing Intrinsic Amplitude: 2.75 mV
Lead Channel Setting Pacing Amplitude: 1.5 V
Lead Channel Setting Pacing Pulse Width: 0.4 ms
Lead Channel Setting Pacing Pulse Width: 0.4 ms
MDC IDC LEAD IMPLANT DT: 20181130
MDC IDC LEAD LOCATION: 753859
MDC IDC LEAD LOCATION: 753860
MDC IDC MSMT BATTERY REMAINING LONGEVITY: 88 mo
MDC IDC MSMT BATTERY VOLTAGE: 3 V
MDC IDC MSMT LEADCHNL LV IMPEDANCE VALUE: 160.941
MDC IDC MSMT LEADCHNL LV IMPEDANCE VALUE: 160.941
MDC IDC MSMT LEADCHNL LV IMPEDANCE VALUE: 266 Ohm
MDC IDC MSMT LEADCHNL LV IMPEDANCE VALUE: 304 Ohm
MDC IDC MSMT LEADCHNL LV IMPEDANCE VALUE: 513 Ohm
MDC IDC MSMT LEADCHNL LV IMPEDANCE VALUE: 532 Ohm
MDC IDC MSMT LEADCHNL LV IMPEDANCE VALUE: 532 Ohm
MDC IDC MSMT LEADCHNL RA PACING THRESHOLD AMPLITUDE: 0.625 V
MDC IDC MSMT LEADCHNL RA PACING THRESHOLD PULSEWIDTH: 0.4 ms
MDC IDC MSMT LEADCHNL RA SENSING INTR AMPL: 2.75 mV
MDC IDC MSMT LEADCHNL RV PACING THRESHOLD PULSEWIDTH: 0.4 ms
MDC IDC SESS DTM: 20200206083422
MDC IDC SET LEADCHNL RA PACING AMPLITUDE: 2 V
MDC IDC SET LEADCHNL RV PACING AMPLITUDE: 2.5 V
MDC IDC SET LEADCHNL RV SENSING SENSITIVITY: 0.3 mV
MDC IDC STAT BRADY RA PERCENT PACED: 52.69 %
MDC IDC STAT BRADY RV PERCENT PACED: 96.01 %

## 2018-03-21 ENCOUNTER — Ambulatory Visit (INDEPENDENT_AMBULATORY_CARE_PROVIDER_SITE_OTHER): Payer: Medicare Other

## 2018-03-21 DIAGNOSIS — I5022 Chronic systolic (congestive) heart failure: Secondary | ICD-10-CM | POA: Diagnosis not present

## 2018-03-21 DIAGNOSIS — Z95810 Presence of automatic (implantable) cardiac defibrillator: Secondary | ICD-10-CM

## 2018-03-21 NOTE — Progress Notes (Signed)
Remote ICD transmission.   

## 2018-03-22 NOTE — Progress Notes (Signed)
EPIC Encounter for ICM Monitoring  Patient Name: Omar Williams is a 83 y.o. male Date: 03/22/2018 Primary Care Physican: Wilson Singer, MD Primary Cardiologist: Purvis Sheffield Electrophysiologist:Allred Nephrologist:[facility] Bi-V Pacing:99.6% Lastweight180.6lbs Today's Weight:180 lbs   Heart failure questions reviewed. Patient asymptomatic.  Thoracic impedancereturned to normal.  Prescribed:No diuretic  Recommendations: Encouraged to call for fluid symptoms.  Follow-up plan: ICM clinic phone appointment on4/08/2018. Office visit with Dr Johney Frame 04/08/2018  Copy of ICM check sent to Dr.Allred.  3 month ICM trend: 03/21/2018    1 Year ICM trend:       Karie Soda, RN 03/22/2018 3:58 PM

## 2018-03-28 DIAGNOSIS — I10 Essential (primary) hypertension: Secondary | ICD-10-CM | POA: Diagnosis not present

## 2018-03-28 DIAGNOSIS — Z9181 History of falling: Secondary | ICD-10-CM | POA: Diagnosis not present

## 2018-03-28 DIAGNOSIS — R946 Abnormal results of thyroid function studies: Secondary | ICD-10-CM | POA: Diagnosis not present

## 2018-03-28 DIAGNOSIS — E119 Type 2 diabetes mellitus without complications: Secondary | ICD-10-CM | POA: Diagnosis not present

## 2018-03-28 DIAGNOSIS — G3184 Mild cognitive impairment, so stated: Secondary | ICD-10-CM | POA: Diagnosis not present

## 2018-03-28 DIAGNOSIS — E785 Hyperlipidemia, unspecified: Secondary | ICD-10-CM | POA: Diagnosis not present

## 2018-03-28 DIAGNOSIS — M25512 Pain in left shoulder: Secondary | ICD-10-CM | POA: Diagnosis not present

## 2018-04-07 IMAGING — CT CT ABD-PELV W/ CM
2 of 5 series · 15 of 46 positions shown, 17 images · IV contrast (Isovue)
Comparison: January 29, 2013

CLINICAL DATA: Left lower quadrant pain.  Dysuria.

EXAM:
CT ABDOMEN AND PELVIS WITH CONTRAST
TECHNIQUE: Multidetector CT imaging of the abdomen and pelvis was performed
using the standard protocol following bolus administration of
intravenous contrast.
CONTRAST:  100mL XSLV10-P44 IOPAMIDOL (XSLV10-P44) INJECTION 61%

[Series 2: axial st · axial · 0.74mm/px · z∈[+837,+1232]mm · 12 of 91 slices shown, 14 images]
[im 6/91  soft-tissue]
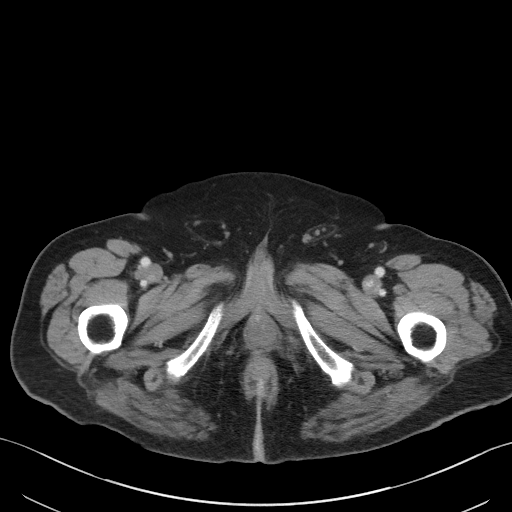
[im 6/91  bone]
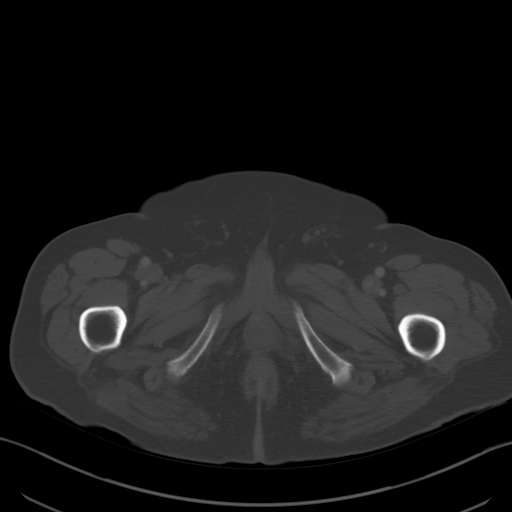
[im 16/91  soft-tissue]
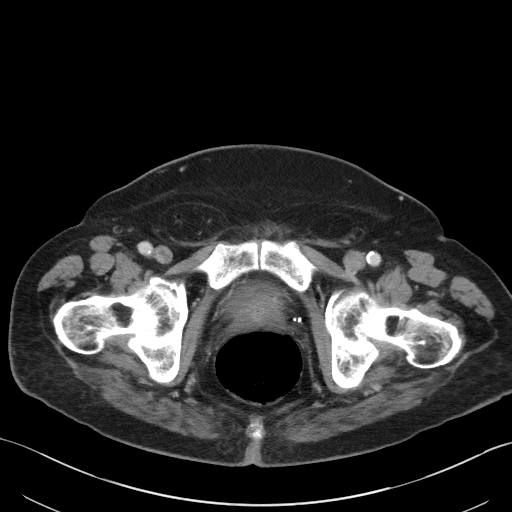
[im 22/91  soft-tissue]
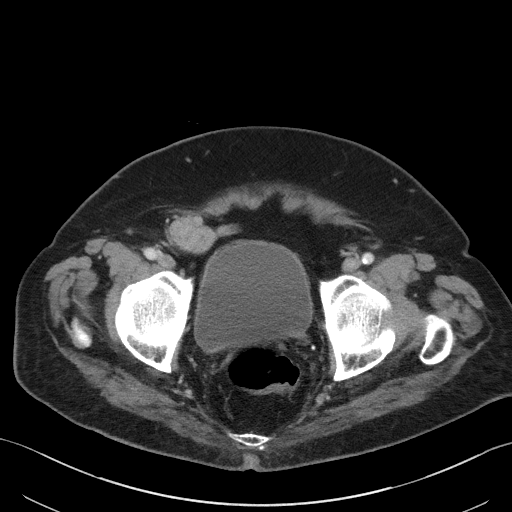
[im 27/91  soft-tissue]
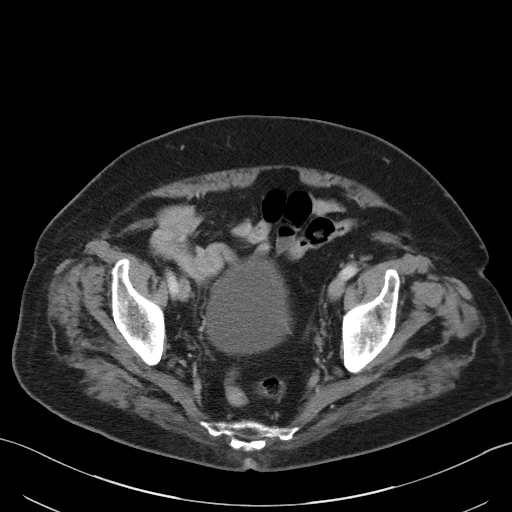
[im 38/91  soft-tissue]
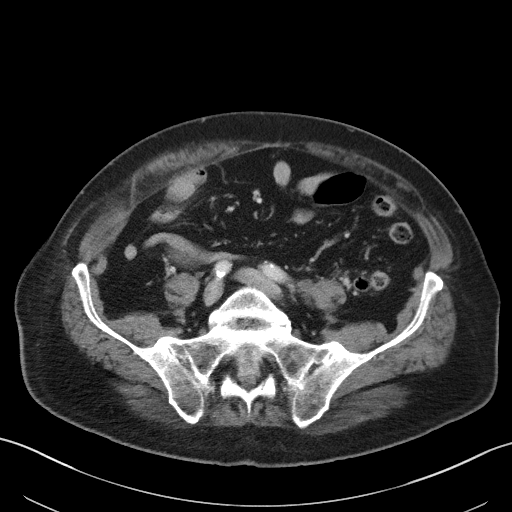
[im 43/91  soft-tissue]
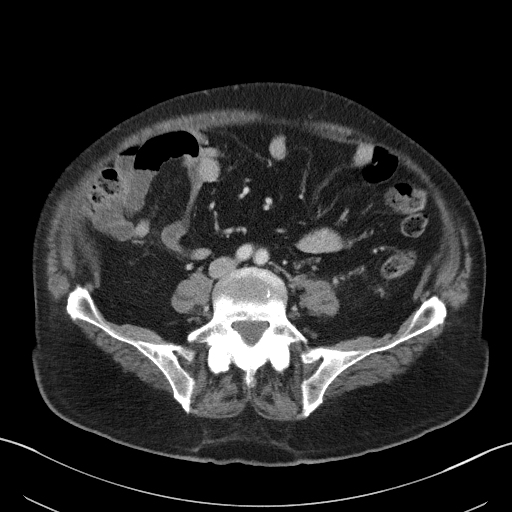
[im 48/91  soft-tissue]
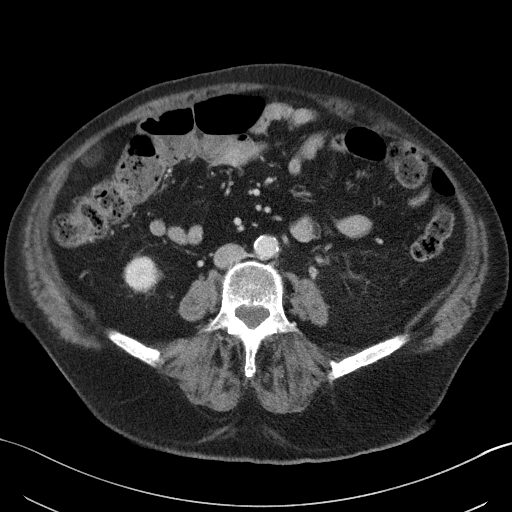
[im 59/91  soft-tissue]
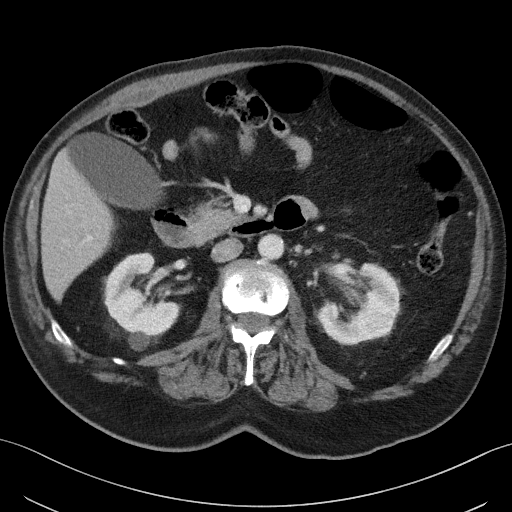
[im 64/91  soft-tissue]
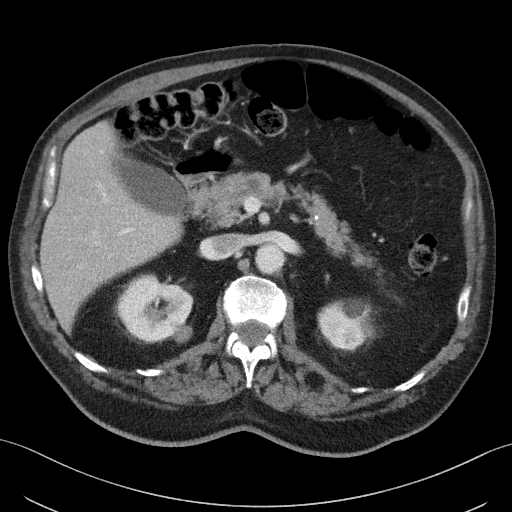
[im 64/91  bone]
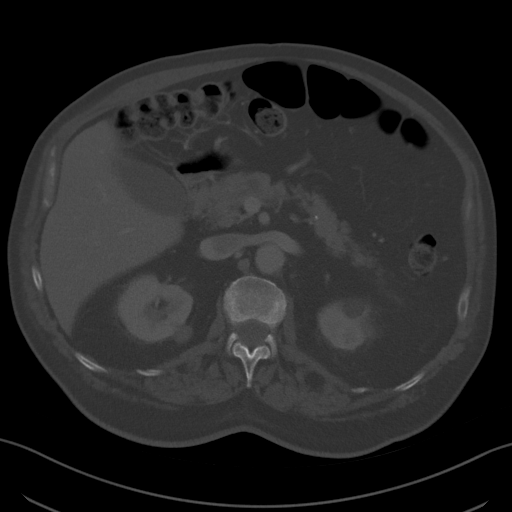
[im 69/91  soft-tissue]
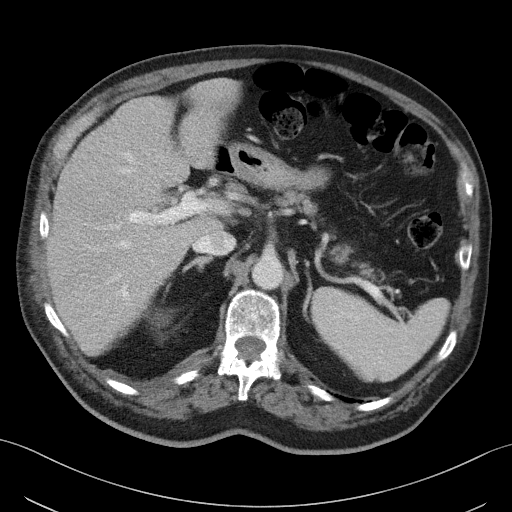
[im 80/91  soft-tissue]
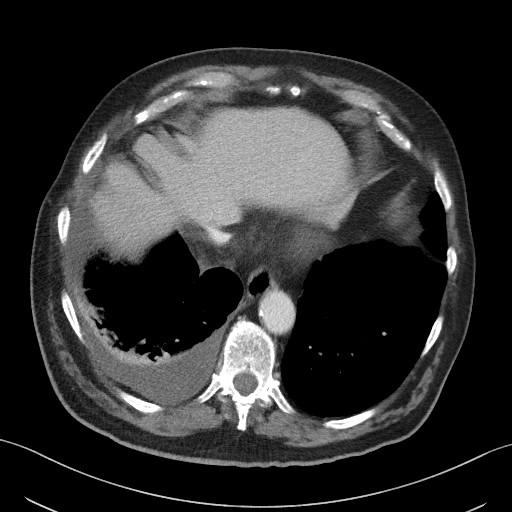
[im 85/91  soft-tissue]
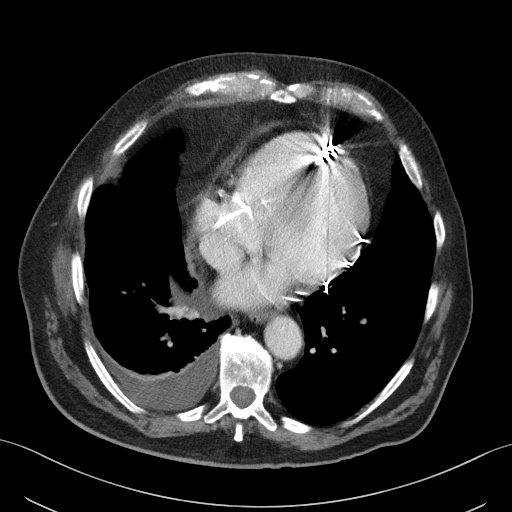

[Series 4: coronal st · coronal · 0.77mm/px · 3 of 106 slices shown]
[im 36/106  soft-tissue]
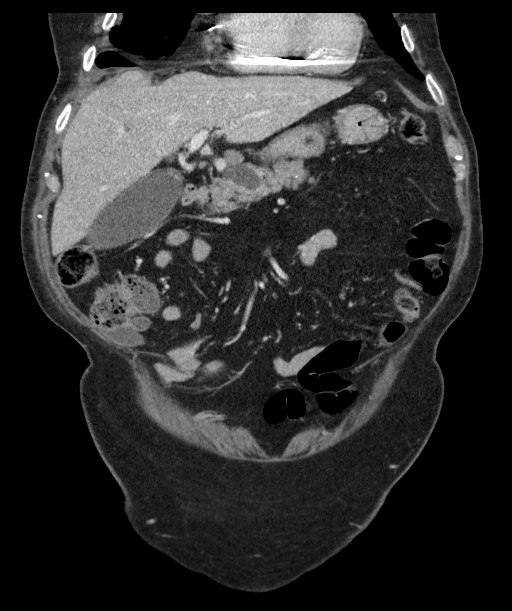
[im 47/106  soft-tissue]
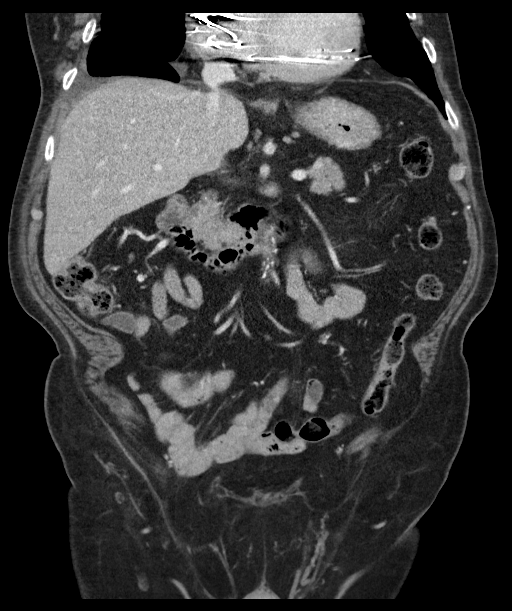
[im 59/106  soft-tissue]
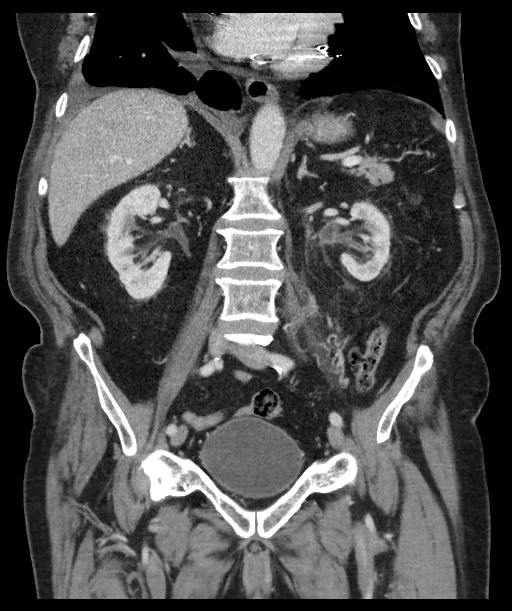

[15 of 46 positions shown; findings below may reference images not displayed]

FINDINGS: Lower chest: A left lower lobe nodule is unchanged since 1918. There
is a small right effusion with underlying atelectasis. Cardiomegaly
identified.

Hepatobiliary: No focal liver abnormality is seen. No gallstones,
gallbladder wall thickening, or biliary dilatation.

Pancreas: There is a 2.1 cm cyst in the proximal pancreatic body,
measuring 1.6 cm in 1918. The pancreas is otherwise normal.

Spleen: Normal in size without focal abnormality.

Adrenals/Urinary Tract: Adrenal glands are normal. There is mild
hydronephrosis on the left. Mild proximal ureteral prominence on the
left. A stone in the mid left ureter on series 2, image 45 measures
4 mm. Proximal to the stone, there is urothelial enhancement in the
proximal ureter. There is left perinephric stranding. The remainder
of the left ureter is normal. Bilateral renal cysts are identified.
No hydronephrosis on the right. A hyperdense mass off the upper
right kidney measures 14 x 11 mm today versus 12 x 8 mm in 1918. No
right ureterectasis or ureteral stone. The bladder is normal.

Stomach/Bowel: The stomach and small bowel are normal. The colon is
normal in appearance. The appendix is not visualized but there is no
secondary evidence of appendicitis.

Vascular/Lymphatic: Mild atherosclerotic change in the nonaneurysmal
aorta, iliac vessels, and femoral vessels. No adenopathy.

Reproductive: Prostate is unremarkable.

Other: A right inguinal hernia contains small bowel and fat. There
is a fat containing left inguinal hernia. No free air or free fluid.

Musculoskeletal: Severe degenerative changes in the hips. No other
acute bony abnormalities.
IMPRESSION: 1. There is a stone in the mid left ureter resulting in mild left
ureterectasis and perinephric stranding. Secondary enhancement of
the proximal ureteral urothelium is identified.
2. A high attenuation mass, exophytic off the upper pole of the
right kidney, is favored to represent a hyperdense cyst. However, it
measures 14 x 11 mm today versus 12 x 8 mm in 1918. Given the small
amount of interval enlargement, recommend correlation with an
ultrasound in an attempt to confirm the cystic nature. If ultrasound
cannot confirm a cyst, an MRI could be utilized to exclude a slowly
growing solid neoplasm which is considered less likely.
3. The mass in the proximal pancreatic body measures 2.1 cm today
versus 1.6 cm in 1918. Given greater than 20% growth in a cystic
mass remaining less than 2.5 cm in size in a person less than 80
years old, recommend a six-month follow-up.
4. Atherosclerotic changes in the aorta as above.
5. Small right pleural effusion with underlying atelectasis.
6. Bilateral inguinal hernias containing fat bilaterally and a small
amount of small bowel on the right without obstruction.

## 2018-04-08 ENCOUNTER — Encounter: Payer: Self-pay | Admitting: Internal Medicine

## 2018-04-08 ENCOUNTER — Ambulatory Visit (INDEPENDENT_AMBULATORY_CARE_PROVIDER_SITE_OTHER): Payer: Medicare Other | Admitting: Internal Medicine

## 2018-04-08 VITALS — BP 133/68 | HR 62 | Ht 69.0 in | Wt 184.6 lb

## 2018-04-08 DIAGNOSIS — I255 Ischemic cardiomyopathy: Secondary | ICD-10-CM | POA: Diagnosis not present

## 2018-04-08 DIAGNOSIS — I5022 Chronic systolic (congestive) heart failure: Secondary | ICD-10-CM

## 2018-04-08 LAB — CUP PACEART INCLINIC DEVICE CHECK
Implantable Lead Implant Date: 20181130
Implantable Lead Location: 753860
Implantable Lead Model: 4598
Implantable Lead Model: 5076
Implantable Lead Model: 6935
MDC IDC LEAD IMPLANT DT: 20181130
MDC IDC LEAD IMPLANT DT: 20181130
MDC IDC LEAD LOCATION: 753858
MDC IDC LEAD LOCATION: 753859
MDC IDC PG IMPLANT DT: 20181130
MDC IDC SESS DTM: 20200306104017

## 2018-04-08 NOTE — Progress Notes (Signed)
PCP: Doree Albee, MD Primary Cardiologist: Dr Bronson Ing Primary EP: Dr Rayann Heman  Omar Williams is a 83 y.o. male who presents today for routine electrophysiology followup.  Since last being seen in our clinic, the patient reports doing very well.  Today, he denies symptoms of palpitations, chest pain, shortness of breath,  lower extremity edema, dizziness, presyncope, syncope, or ICD shocks.  The patient is otherwise without complaint today.   Past Medical History:  Diagnosis Date  . Acid reflux   . Blood transfusion without reported diagnosis   . Cardiac arrest ([place])    a. occuring in 12/2016. LAD disease but no targets for CABG or PCI. Underwent ICD placement as EF 15-20%.   . CHF (congestive heart failure) ([place])   . Coronary artery disease   . Diabetes mellitus without complication ([place])   . Hyperlipidemia   . Hypertension   . Myocardial infarction ([place])   . Renal disorder    Past Surgical History:  Procedure Laterality Date  . BIV ICD INSERTION CRT-D N/A 01/01/2017   Procedure: BIV ICD INSERTION CRT-D;  Surgeon: Constance Haw, MD;  Location: [facility];  Service: Cardiovascular;  Laterality: N/A;    ROS- all systems are reviewed and negative except as per HPI above  Current Outpatient Medications  Medication Sig Dispense Refill  . aspirin EC 81 MG tablet Take 81 mg by mouth daily.    Marland Kitchen atorvastatin (LIPITOR) 80 MG tablet Take 1 tablet (80 mg total) by mouth at bedtime. 90 tablet 3  . Bee Pollen 550 MG CAPS Take 1 capsule by mouth daily.    . blood glucose meter kit and supplies KIT Test daily.  Dx e11.22 1 each 0  . carvedilol (COREG) 6.25 MG tablet Take 1 tablet (6.25 mg total) by mouth 2 (two) times daily. 180 tablet 3  . Cholecalciferol (VITAMIN D3) 50 MCG (2000 UT) TABS Take 1 tablet by mouth daily.    . finasteride (PROSCAR) 5 MG tablet Take 1 tablet (5 mg total) by mouth at bedtime. 90 tablet 3  . glimepiride (AMARYL) 4 MG tablet Take 1 tablet (4  mg total) by mouth 2 (two) times daily. 180 tablet 3  . glucose blood (COOL BLOOD GLUCOSE TEST STRIPS) test strip Use as instructed 100 each 2  . losartan (COZAAR) 50 MG tablet Take 1 tablet (50 mg total) by mouth 2 (two) times daily. 180 tablet 3  . mirabegron ER (MYRBETRIQ) 25 MG TB24 tablet Take 25 mg by mouth daily.    . nitroGLYCERIN (NITROSTAT) 0.4 MG SL tablet Place 1 tablet (0.4 mg total) under the tongue every 5 (five) minutes x 3 doses as needed for chest pain. 25 tablet 2  . Omega-3 Fatty Acids (FISH OIL) 1200 MG CAPS Take 1 capsule by mouth daily.    . tamsulosin (FLOMAX) 0.4 MG CAPS capsule Take 1 capsule (0.4 mg total) by mouth at bedtime. 5 capsule 0  . TURMERIC PO Take 538 mg by mouth daily.    . vitamin B-12 (CYANOCOBALAMIN) 1000 MCG tablet Take 1,000 mcg by mouth daily.    . Vitamin D, Ergocalciferol, (DRISDOL) 50000 units CAPS capsule Take 1 capsule (50,000 Units total) by mouth every 30 (thirty) days. 12 capsule 0   No current facility-administered medications for this visit.     Physical Exam: Vitals:   04/08/18 1126  BP: 133/68  Pulse: 62  Weight: 184 lb 9.6 oz (83.7 kg)  Height: _0  (1.753 m)  GEN- The patient is well appearing, alert and oriented x 3 today.   Head- normocephalic, atraumatic Eyes-  Sclera clear, conjunctiva pink Ears- hearing intact Oropharynx- clear Lungs- Clear to ausculation bilaterally, normal work of breathing Chest- ICD pocket is well healed Heart- Regular rate and rhythm, no murmurs, rubs or gallops, PMI not laterally displaced GI- soft, NT, ND, + BS Extremities- no clubbing, cyanosis, or edema  ICD interrogation- reviewed in detail today,  See PACEART report  ekg tracing ordered today is personally reviewed and shows AV paced  Wt Readings from Last 3 Encounters:  04/08/18 184 lb 9.6 oz (83.7 kg)  12/09/17 183 lb (83 kg)  08/26/17 181 lb 0.6 oz (82.1 kg)    Assessment and Plan:  1.  Chronic systolic dysfunction/ ischemic  CM? CAD euvolemic today No ischemic symptoms Stable on an appropriate medical regimen Normal ICD function See Pace Art report No changes today  followed in ICM device clinic  2. S/p VF arrest 11/18 No further events  [facility] Return in a year  Thompson Grayer MD, [facility] 04/08/2018 11:47 AM

## 2018-04-08 NOTE — Patient Instructions (Signed)
Medication Instructions:  Continue all current medications.   Follow-Up: 1 year - Dr. Allred   Any Other Special Instructions Will Be Listed Below (If Applicable).  If you need a refill on your cardiac medications before your next appointment, please call your pharmacy.  

## 2018-04-12 ENCOUNTER — Ambulatory Visit (INDEPENDENT_AMBULATORY_CARE_PROVIDER_SITE_OTHER): Payer: Medicare Other | Admitting: *Deleted

## 2018-04-12 DIAGNOSIS — I255 Ischemic cardiomyopathy: Secondary | ICD-10-CM

## 2018-04-13 ENCOUNTER — Encounter (INDEPENDENT_AMBULATORY_CARE_PROVIDER_SITE_OTHER): Payer: Self-pay | Admitting: Internal Medicine

## 2018-04-13 LAB — CUP PACEART REMOTE DEVICE CHECK
Brady Statistic AP VP Percent: 35.99 %
Brady Statistic AS VP Percent: 63.19 %
Brady Statistic AS VS Percent: 0.76 %
Date Time Interrogation Session: 20200310062722
HIGH POWER IMPEDANCE MEASURED VALUE: 80 Ohm
Implantable Lead Implant Date: 20181130
Implantable Lead Location: 753860
Implantable Lead Model: 4598
Implantable Lead Model: 5076
Implantable Lead Model: 6935
Implantable Pulse Generator Implant Date: 20181130
Lead Channel Impedance Value: 152 Ohm
Lead Channel Impedance Value: 152 Ohm
Lead Channel Impedance Value: 156.606
Lead Channel Impedance Value: 304 Ohm
Lead Channel Impedance Value: 380 Ohm
Lead Channel Impedance Value: 399 Ohm
Lead Channel Impedance Value: 494 Ohm
Lead Channel Impedance Value: 494 Ohm
Lead Channel Impedance Value: 494 Ohm
Lead Channel Impedance Value: 665 Ohm
Lead Channel Pacing Threshold Amplitude: 0.25 V
Lead Channel Pacing Threshold Amplitude: 0.625 V
Lead Channel Pacing Threshold Amplitude: 0.875 V
Lead Channel Pacing Threshold Pulse Width: 0.4 ms
Lead Channel Sensing Intrinsic Amplitude: 10.5 mV
Lead Channel Sensing Intrinsic Amplitude: 3 mV
Lead Channel Sensing Intrinsic Amplitude: 3 mV
Lead Channel Setting Pacing Amplitude: 1.5 V
Lead Channel Setting Pacing Pulse Width: 0.4 ms
Lead Channel Setting Sensing Sensitivity: 0.3 mV
MDC IDC LEAD IMPLANT DT: 20181130
MDC IDC LEAD IMPLANT DT: 20181130
MDC IDC LEAD LOCATION: 753858
MDC IDC LEAD LOCATION: 753859
MDC IDC MSMT BATTERY REMAINING LONGEVITY: 88 mo
MDC IDC MSMT BATTERY VOLTAGE: 2.99 V
MDC IDC MSMT LEADCHNL LV IMPEDANCE VALUE: 152 Ohm
MDC IDC MSMT LEADCHNL LV IMPEDANCE VALUE: 156.606
MDC IDC MSMT LEADCHNL LV IMPEDANCE VALUE: 304 Ohm
MDC IDC MSMT LEADCHNL LV IMPEDANCE VALUE: 304 Ohm
MDC IDC MSMT LEADCHNL LV IMPEDANCE VALUE: 323 Ohm
MDC IDC MSMT LEADCHNL LV IMPEDANCE VALUE: 494 Ohm
MDC IDC MSMT LEADCHNL LV IMPEDANCE VALUE: 513 Ohm
MDC IDC MSMT LEADCHNL LV PACING THRESHOLD PULSEWIDTH: 0.4 ms
MDC IDC MSMT LEADCHNL RA PACING THRESHOLD PULSEWIDTH: 0.4 ms
MDC IDC MSMT LEADCHNL RV IMPEDANCE VALUE: 627 Ohm
MDC IDC MSMT LEADCHNL RV SENSING INTR AMPL: 10.5 mV
MDC IDC SET LEADCHNL LV PACING PULSEWIDTH: 0.4 ms
MDC IDC SET LEADCHNL RA PACING AMPLITUDE: 2 V
MDC IDC SET LEADCHNL RV PACING AMPLITUDE: 2.5 V
MDC IDC STAT BRADY AP VS PERCENT: 0.07 %
MDC IDC STAT BRADY RA PERCENT PACED: 36.05 %
MDC IDC STAT BRADY RV PERCENT PACED: 90.35 %

## 2018-04-19 ENCOUNTER — Encounter: Payer: Self-pay | Admitting: Cardiology

## 2018-04-19 NOTE — Progress Notes (Signed)
Remote ICD transmission.   

## 2018-05-10 ENCOUNTER — Other Ambulatory Visit: Payer: Self-pay

## 2018-05-10 ENCOUNTER — Ambulatory Visit (INDEPENDENT_AMBULATORY_CARE_PROVIDER_SITE_OTHER): Payer: Medicare Other

## 2018-05-10 DIAGNOSIS — D509 Iron deficiency anemia, unspecified: Secondary | ICD-10-CM | POA: Diagnosis not present

## 2018-05-10 DIAGNOSIS — I5022 Chronic systolic (congestive) heart failure: Secondary | ICD-10-CM | POA: Diagnosis not present

## 2018-05-10 DIAGNOSIS — Z95810 Presence of automatic (implantable) cardiac defibrillator: Secondary | ICD-10-CM

## 2018-05-10 DIAGNOSIS — Z79899 Other long term (current) drug therapy: Secondary | ICD-10-CM | POA: Diagnosis not present

## 2018-05-10 DIAGNOSIS — E559 Vitamin D deficiency, unspecified: Secondary | ICD-10-CM | POA: Diagnosis not present

## 2018-05-10 DIAGNOSIS — I10 Essential (primary) hypertension: Secondary | ICD-10-CM | POA: Diagnosis not present

## 2018-05-10 DIAGNOSIS — N183 Chronic kidney disease, stage 3 (moderate): Secondary | ICD-10-CM | POA: Diagnosis not present

## 2018-05-10 DIAGNOSIS — R809 Proteinuria, unspecified: Secondary | ICD-10-CM | POA: Diagnosis not present

## 2018-05-13 ENCOUNTER — Telehealth: Payer: Self-pay

## 2018-05-13 NOTE — Progress Notes (Signed)
Pt returned call and reported he is doing well. Transmission reviewed.  He does not have any fluid symptoms at this time.  Weight is stable at 181 lbs.  He reports he is eating fast food about every other day and cautioned him that restaurant foods are high in salt.  He is mainly eating at [facility].  No changes today.  Next remote transmission 06/13/2018.

## 2018-05-13 NOTE — Progress Notes (Signed)
EPIC Encounter for ICM Monitoring  Patient Name: Omar Williams is a 83 y.o. male Date: 05/13/2018 Primary Care Physican: Wilson Singer, MD Primary Cardiologist: Purvis Sheffield Electrophysiologist:Allred Nephrologist:[facility] Bi-V Pacing:99.6% Lastweight180.6lbs 05/13/2018 Weight:unknown   Attempted call to patient and unable to reach.  Left message to return call. Transmission reviewed.   Thoracic impedancereturned to normal.  Prescribed:No diuretic  Recommendations:Unable to reach.  Follow-up plan: ICM clinic phone appointment on5/12/2018.   Office appt 06/07/2018 with Dr. Purvis Sheffield.    Copy of ICM check sent to Dr. Johney Frame.   3 month ICM trend: 05/10/2018    1 Year ICM trend:       Karie Soda, RN 05/13/2018 9:57 AM

## 2018-05-13 NOTE — Telephone Encounter (Signed)
Remote ICM transmission received.  Attempted call to patient regarding ICM remote transmission and left message to return call   

## 2018-05-16 ENCOUNTER — Ambulatory Visit (INDEPENDENT_AMBULATORY_CARE_PROVIDER_SITE_OTHER): Payer: Medicare Other | Admitting: Internal Medicine

## 2018-05-16 DIAGNOSIS — E785 Hyperlipidemia, unspecified: Secondary | ICD-10-CM | POA: Diagnosis not present

## 2018-05-16 DIAGNOSIS — I2510 Atherosclerotic heart disease of native coronary artery without angina pectoris: Secondary | ICD-10-CM | POA: Diagnosis not present

## 2018-05-16 DIAGNOSIS — E119 Type 2 diabetes mellitus without complications: Secondary | ICD-10-CM | POA: Diagnosis not present

## 2018-05-16 DIAGNOSIS — I10 Essential (primary) hypertension: Secondary | ICD-10-CM | POA: Diagnosis not present

## 2018-06-02 ENCOUNTER — Telehealth: Payer: Self-pay | Admitting: *Deleted

## 2018-06-02 NOTE — Telephone Encounter (Signed)
Patient verbally consented for telehealth visits with [facility] and understands that his insurance company will be billed for the encounter.   Aware to have vitals available 

## 2018-06-07 ENCOUNTER — Encounter: Payer: Self-pay | Admitting: Cardiovascular Disease

## 2018-06-07 ENCOUNTER — Telehealth (INDEPENDENT_AMBULATORY_CARE_PROVIDER_SITE_OTHER): Payer: Medicare Other | Admitting: Cardiovascular Disease

## 2018-06-07 ENCOUNTER — Telehealth: Payer: Self-pay | Admitting: *Deleted

## 2018-06-07 VITALS — BP 122/68 | HR 66 | Ht 69.0 in | Wt 182.0 lb

## 2018-06-07 DIAGNOSIS — I255 Ischemic cardiomyopathy: Secondary | ICD-10-CM

## 2018-06-07 DIAGNOSIS — I25118 Atherosclerotic heart disease of native coronary artery with other forms of angina pectoris: Secondary | ICD-10-CM

## 2018-06-07 DIAGNOSIS — Z95810 Presence of automatic (implantable) cardiac defibrillator: Secondary | ICD-10-CM

## 2018-06-07 DIAGNOSIS — I5022 Chronic systolic (congestive) heart failure: Secondary | ICD-10-CM

## 2018-06-07 DIAGNOSIS — E785 Hyperlipidemia, unspecified: Secondary | ICD-10-CM

## 2018-06-07 NOTE — Telephone Encounter (Signed)
Per [facility] staff, last lipids done 12/2017. Requested copy be faxed to office.

## 2018-06-07 NOTE — Patient Instructions (Addendum)

## 2018-06-07 NOTE — Progress Notes (Signed)
Virtual Visit via Video Note   This visit type was conducted due to national recommendations for restrictions regarding the COVID-19 Pandemic (e.g. social distancing) in an effort to limit this patient's exposure and mitigate transmission in our community.  Due to his co-morbid illnesses, this patient is at least at moderate risk for complications without adequate follow up.  This format is felt to be most appropriate for this patient at this time.  All issues noted in this document were discussed and addressed.  A limited physical exam was performed with this format.  Please refer to the patient's chart for his consent to telehealth for [facility].   Date:  06/07/2018   ID:  Omar Williams, DOB April 09, 1935, MRN [id number]  Patient Location: Home Provider Location: Office  PCP:  Doree Albee, MD  Cardiologist:  Kate Sable, MD  Electrophysiologist:  Thompson Grayer, MD   Evaluation Performed:  Follow-Up Visit  Chief Complaint:  CAD  History of Present Illness:    Omar Williams is a 83 y.o. male with a history of ischemic cardiomyopathy, biventricular ICD, and multivessel coronary artery disease.  The patient denies any symptoms of chest pain, palpitations, shortness of breath, lightheadedness, dizziness, leg swelling, orthopnea, PND, and syncope.  He wears a mask when he goes out.  They have been eating most meals at home but occasionally go out for lunch to [place].  The patient does not have symptoms concerning for COVID-19 infection (fever, chills, cough, or new shortness of breath).   Social history:His wife, [name] also my patient. He is a Company secretary.   Past Medical History:  Diagnosis Date  . Acid reflux   . Blood transfusion without reported diagnosis   . Cardiac arrest ([place])    a. occuring in 12/2016. LAD disease but no targets for CABG or PCI. Underwent ICD placement as EF 15-20%.   . CHF (congestive heart failure) ([place])   . Coronary artery  disease   . Diabetes mellitus without complication ([place])   . Hyperlipidemia   . Hypertension   . Myocardial infarction ([place])   . Renal disorder    Past Surgical History:  Procedure Laterality Date  . BIV ICD INSERTION CRT-D N/A 01/01/2017   Procedure: BIV ICD INSERTION CRT-D;  Surgeon: Constance Haw, MD;  Location: [facility];  Service: Cardiovascular;  Laterality: N/A;     Current Meds  Medication Sig  . aspirin EC 81 MG tablet Take 81 mg by mouth daily.  Marland Kitchen atorvastatin (LIPITOR) 80 MG tablet Take 1 tablet (80 mg total) by mouth at bedtime.  Raelyn Ensign Pollen 550 MG CAPS Take 1 capsule by mouth daily.  . blood glucose meter kit and supplies KIT Test daily.  Dx e11.22  . carvedilol (COREG) 6.25 MG tablet Take 1 tablet (6.25 mg total) by mouth 2 (two) times daily.  . Cholecalciferol (VITAMIN D3) 50 MCG (2000 UT) TABS Take 1 tablet by mouth daily.  . finasteride (PROSCAR) 5 MG tablet Take 1 tablet (5 mg total) by mouth at bedtime.  Marland Kitchen glimepiride (AMARYL) 4 MG tablet Take 1 tablet (4 mg total) by mouth 2 (two) times daily.  Marland Kitchen glucose blood (COOL BLOOD GLUCOSE TEST STRIPS) test strip Use as instructed  . losartan (COZAAR) 50 MG tablet Take 1 tablet (50 mg total) by mouth 2 (two) times daily.  . nitroGLYCERIN (NITROSTAT) 0.4 MG SL tablet Place 1 tablet (0.4 mg total) under the tongue every 5 (five) minutes x 3 doses  as needed for chest pain.  . Omega-3 Fatty Acids (FISH OIL) 1200 MG CAPS Take 1 capsule by mouth daily.  Marland Kitchen oxybutynin (DITROPAN) 5 MG tablet Take 2.5 mg by mouth 3 (three) times daily.  . tamsulosin (FLOMAX) 0.4 MG CAPS capsule Take 1 capsule (0.4 mg total) by mouth at bedtime.  . TURMERIC PO Take 538 mg by mouth daily.  . vitamin B-12 (CYANOCOBALAMIN) 1000 MCG tablet Take 1,000 mcg by mouth daily.  . Vitamin D, Ergocalciferol, (DRISDOL) 50000 units CAPS capsule Take 1 capsule (50,000 Units total) by mouth every 30 (thirty) days.     Allergies:   Patient has no  known allergies.   Social History   Tobacco Use  . Smoking status: Never Smoker  . Smokeless tobacco: Never Used  Substance Use Topics  . Alcohol use: No  . Drug use: No     Family Hx: The patient's family history includes CAD in his brother, brother, and sister; Diabetes in his brother, brother, daughter, and sister; Fibromyalgia in his daughter; Heart disease in his daughter; Hyperlipidemia in his brother, brother, daughter, mother, and sister; Hypertension in his brother, brother, daughter, mother, and sister; Multiple sclerosis in his daughter; Thyroid disease in his daughter and daughter.  ROS:   Please see the history of present illness.     All other systems reviewed and are negative.   Prior CV studies:   The following studies were reviewed today:  NA  Labs/Other Tests and Data Reviewed:    EKG:  No ECG reviewed.  Recent Labs: 08/23/2017: ALT 24; BUN 22; Creat 1.50; Potassium 5.1; Sodium 139   Recent Lipid Panel Lab Results  Component Value Date/Time   CHOL 165 08/23/2017 09:17 AM   TRIG 110 08/23/2017 09:17 AM   HDL 49 08/23/2017 09:17 AM   CHOLHDL 3.4 08/23/2017 09:17 AM   LDLCALC 95 08/23/2017 09:17 AM    Wt Readings from Last 3 Encounters:  06/07/18 182 lb (82.6 kg)  04/08/18 184 lb 9.6 oz (83.7 kg)  12/09/17 183 lb (83 kg)     Objective:    Vital Signs:  BP 122/68   Pulse 66   Ht _0  (1.753 m)   Wt 182 lb (82.6 kg)   BMI 26.88 kg/m    VITAL SIGNS:  reviewed GEN:  no acute distress EYES:  sclerae anicteric, EOMI - Extraocular Movements Intact RESPIRATORY:  normal respiratory effort, symmetric expansion MUSCULOSKELETAL:  no obvious deformities. NEURO:  alert and oriented x 3, no obvious focal deficit PSYCH:  normal affect  ASSESSMENT & PLAN:    1. Chronic systolic heart failure: Symptomatically stable.  Thoracic impedance was normal on 05/10/18.LVEF has improved to 40 to 45%. I will continue carvedilol 6.25 mg twice daily and  losartan 50 mg twice daily.  2. Ischemic cardiomyopathy status post biventricular ICD in November 2018: Symptomatically stable. Followed by EP.LVEF has improved to 40 to 45% by echocardiogram on 06/09/2017.  3. Multivessel coronary artery disease: Stable ischemic heart disease. He has a tight proximal LAD stenosis with the remainder of the vessel being small in caliber. The circumflex had mild proximal narrowing in the RCA had no significant stenosis. There were no graftable targets nor good targets for PCI. Continue aspirin, statin, and beta-blocker.  4. Hyperlipidemia: He is on high intensity statin therapy with Lipitor 80 mg.LDL 95 on 08/23/17.I will see if a lipid panel has been checked since then and if not, I will order one.   COVID-19 Education: The  signs and symptoms of COVID-19 were discussed with the patient and how to seek care for testing (follow up with PCP or arrange E-visit).  The importance of social distancing was discussed today.  Time:   Today, I have spent 15 minutes with the patient with telehealth technology discussing the above problems.     Medication Adjustments/Labs and Tests Ordered: Current medicines are reviewed at length with the patient today.  Concerns regarding medicines are outlined above.   Tests Ordered: No orders of the defined types were placed in this encounter.   Medication Changes: No orders of the defined types were placed in this encounter.   Disposition:  Follow up in 6 month(s)  Signed, Kate Sable, MD  06/07/2018 9:10 AM    [facility]

## 2018-06-13 ENCOUNTER — Other Ambulatory Visit: Payer: Self-pay

## 2018-06-13 ENCOUNTER — Ambulatory Visit (INDEPENDENT_AMBULATORY_CARE_PROVIDER_SITE_OTHER): Payer: Medicare Other

## 2018-06-13 DIAGNOSIS — Z95810 Presence of automatic (implantable) cardiac defibrillator: Secondary | ICD-10-CM

## 2018-06-13 DIAGNOSIS — I5022 Chronic systolic (congestive) heart failure: Secondary | ICD-10-CM | POA: Diagnosis not present

## 2018-06-14 NOTE — Progress Notes (Signed)
EPIC Encounter for ICM Monitoring  Patient Name: Omar Williams is a 83 y.o. male Date: 06/14/2018 Primary Care Physican: Wilson Singer, MD Primary Cardiologist: Purvis Sheffield Electrophysiologist:Allred Nephrologist:[facility] Bi-V Pacing:99.7% 06/14/2018 Weight:182 lbs   Transmission reviewed.  Spoke with patient.  He reports feeling well and is asymptomatic.   Optivol Thoracic impedancenormal.  Prescribed:No diuretic  Recommendations:Encouraged to call if he develops fluid symptoms.  Follow-up plan: ICM clinic phone appointment on6/01/2019.     Copy of ICM check sent to Dr. Johney Frame.   3 month ICM trend: 06/13/2018    1 Year ICM trend:       Karie Soda, RN 06/14/2018 4:26 PM

## 2018-06-20 ENCOUNTER — Encounter: Payer: Self-pay | Admitting: *Deleted

## 2018-06-21 ENCOUNTER — Other Ambulatory Visit: Payer: Self-pay | Admitting: Urology

## 2018-06-21 ENCOUNTER — Other Ambulatory Visit (HOSPITAL_COMMUNITY): Payer: Self-pay | Admitting: Urology

## 2018-06-21 DIAGNOSIS — N281 Cyst of kidney, acquired: Secondary | ICD-10-CM

## 2018-06-22 ENCOUNTER — Telehealth: Payer: Self-pay | Admitting: Cardiovascular Disease

## 2018-06-22 NOTE — Telephone Encounter (Signed)
Spoke with patient - did not call him today.  Spoke with him on Monday.  Informed him that no labs has been received in office yet from Dr. Kristian Covey.  Will let him know as soon as we receive.  He verbalized understanding.

## 2018-06-22 NOTE — Telephone Encounter (Signed)
Returning Gayle's call  

## 2018-07-05 DIAGNOSIS — I10 Essential (primary) hypertension: Secondary | ICD-10-CM | POA: Diagnosis not present

## 2018-07-05 DIAGNOSIS — E559 Vitamin D deficiency, unspecified: Secondary | ICD-10-CM | POA: Diagnosis not present

## 2018-07-05 DIAGNOSIS — E119 Type 2 diabetes mellitus without complications: Secondary | ICD-10-CM | POA: Diagnosis not present

## 2018-07-05 DIAGNOSIS — N400 Enlarged prostate without lower urinary tract symptoms: Secondary | ICD-10-CM | POA: Diagnosis not present

## 2018-07-05 DIAGNOSIS — E785 Hyperlipidemia, unspecified: Secondary | ICD-10-CM | POA: Diagnosis not present

## 2018-07-12 ENCOUNTER — Ambulatory Visit (INDEPENDENT_AMBULATORY_CARE_PROVIDER_SITE_OTHER): Payer: Medicare Other | Admitting: *Deleted

## 2018-07-12 DIAGNOSIS — I5022 Chronic systolic (congestive) heart failure: Secondary | ICD-10-CM

## 2018-07-12 DIAGNOSIS — I255 Ischemic cardiomyopathy: Secondary | ICD-10-CM

## 2018-07-12 LAB — CUP PACEART REMOTE DEVICE CHECK
Battery Remaining Longevity: 88 mo
Battery Voltage: 3 V
Brady Statistic AP VP Percent: 70.34 %
Brady Statistic AP VS Percent: 0.03 %
Brady Statistic AS VP Percent: 29.54 %
Brady Statistic AS VS Percent: 0.1 %
Brady Statistic RA Percent Paced: 70.36 %
Brady Statistic RV Percent Paced: 97.99 %
Date Time Interrogation Session: 20200609041804
HighPow Impedance: 83 Ohm
Implantable Lead Implant Date: 20181130
Implantable Lead Implant Date: 20181130
Implantable Lead Implant Date: 20181130
Implantable Lead Location: 753858
Implantable Lead Location: 753859
Implantable Lead Location: 753860
Implantable Lead Model: 4598
Implantable Lead Model: 5076
Implantable Lead Model: 6935
Implantable Pulse Generator Implant Date: 20181130
Lead Channel Impedance Value: 152 Ohm
Lead Channel Impedance Value: 156.606
Lead Channel Impedance Value: 156.606
Lead Channel Impedance Value: 156.606
Lead Channel Impedance Value: 156.606
Lead Channel Impedance Value: 304 Ohm
Lead Channel Impedance Value: 304 Ohm
Lead Channel Impedance Value: 323 Ohm
Lead Channel Impedance Value: 323 Ohm
Lead Channel Impedance Value: 399 Ohm
Lead Channel Impedance Value: 437 Ohm
Lead Channel Impedance Value: 456 Ohm
Lead Channel Impedance Value: 494 Ohm
Lead Channel Impedance Value: 494 Ohm
Lead Channel Impedance Value: 513 Ohm
Lead Channel Impedance Value: 513 Ohm
Lead Channel Impedance Value: 646 Ohm
Lead Channel Impedance Value: 760 Ohm
Lead Channel Pacing Threshold Amplitude: 0.375 V
Lead Channel Pacing Threshold Amplitude: 0.75 V
Lead Channel Pacing Threshold Amplitude: 0.75 V
Lead Channel Pacing Threshold Pulse Width: 0.4 ms
Lead Channel Pacing Threshold Pulse Width: 0.4 ms
Lead Channel Pacing Threshold Pulse Width: 0.4 ms
Lead Channel Sensing Intrinsic Amplitude: 11.125 mV
Lead Channel Sensing Intrinsic Amplitude: 11.125 mV
Lead Channel Sensing Intrinsic Amplitude: 2.25 mV
Lead Channel Sensing Intrinsic Amplitude: 2.25 mV
Lead Channel Setting Pacing Amplitude: 1.25 V
Lead Channel Setting Pacing Amplitude: 2 V
Lead Channel Setting Pacing Amplitude: 2.5 V
Lead Channel Setting Pacing Pulse Width: 0.4 ms
Lead Channel Setting Pacing Pulse Width: 0.4 ms
Lead Channel Setting Sensing Sensitivity: 0.3 mV

## 2018-07-18 ENCOUNTER — Encounter: Payer: Self-pay | Admitting: *Deleted

## 2018-07-19 ENCOUNTER — Other Ambulatory Visit: Payer: Self-pay

## 2018-07-19 ENCOUNTER — Ambulatory Visit (HOSPITAL_COMMUNITY)
Admission: RE | Admit: 2018-07-19 | Discharge: 2018-07-19 | Disposition: A | Discharge status: Home / Self Care | Payer: Medicare Other | Source: Ambulatory Visit | Attending: Urology | Admitting: Urology

## 2018-07-19 DIAGNOSIS — N281 Cyst of kidney, acquired: Secondary | ICD-10-CM

## 2018-07-20 NOTE — Progress Notes (Signed)
Remote ICD transmission.   

## 2018-07-26 ENCOUNTER — Telehealth: Payer: Self-pay | Admitting: *Deleted

## 2018-07-26 MED ORDER — EZETIMIBE 10 MG PO TABS: 10.0000 mg | ORAL_TABLET | Freq: Every day | ORAL | 6 refills | Status: DC

## 2018-07-26 NOTE — Telephone Encounter (Signed)
Notes recorded by Laurine Blazer, LPN on 0/09/6759 at 9:50 PM EDT  Detailed voice message left for patient. New medication sent to pharmacy today. Will mail reminder when time to repeat labs.  ------   Notes recorded by Herminio Commons, MD on 07/26/2018 at 1:08 PM EDT  He eats out a lot (fried, fatty foods). He need dietary modification. Would also add Zetia 10 mg with repeat lipids in 6 months.  ------   Notes recorded by Laurine Blazer, LPN on 9/32/6712 at 4:58 AM EDT  Labs received and sent for scanning & review.  ------   Notes recorded by Laurine Blazer, LPN on 0/99/8338 at 25:05 AM EDT  Patient notified. States that he recently had his wellness with Anastasio Champion & he did labs. Will now request those.  ------   Notes recorded by Laurine Blazer, LPN on 04/11/7671 at 41:93 AM EDT  Phone rings, then disconnects.  ------   Notes recorded by Laurine Blazer, LPN on 08/11/238 at 9:73 PM EDT  Left message to return call.   ------   Notes recorded by Laurine Blazer, LPN on 06/05/2990 at 4:26 PM EDT  Labs reviewed from Dr. Florentina Addison office. Just as suspected, no lipids were done. Per Dr. Bronson Ing - can get Lipids done in the next several weeks, then if LDL high, he will make recommendations.  ------   Notes recorded by Laurine Blazer, LPN on 8/34/1962 at 22:97 AM EDT  No labs received in office yet, patient made aware.  ------   Notes recorded by Laurine Blazer, LPN on 9/89/2119 at 41:74 AM EDT  Patient notified. Stated that Dr. Lowanda Foster just did labs approximately 3 weeks ago. Not likely that Lipids was done by kidney doctor, but will request for review first before beginning new medication.  ------   Notes recorded by Laurine Blazer, LPN on 0/09/1446 at 1:85 PM EDT  Left message to return call.   ------   Notes recorded by Herminio Commons, MD on 06/09/2018 at 4:29 PM EDT  LDL still high. Would add Zetia 10 mg daily. A lot of this is from his diet. I would check a BMET  if November is his most recent one.

## 2018-08-03 ENCOUNTER — Telehealth: Payer: Self-pay | Admitting: Cardiovascular Disease

## 2018-08-03 NOTE — Telephone Encounter (Signed)
Pt aware that Zetia was sent on 07/2318 and that pharmacy confirmed receipt

## 2018-08-03 NOTE — Telephone Encounter (Signed)
Patient called stating that he went to [place] yesterday to pick up ezetimibe (ZETIA) 10 MG tablet  [facility].

## 2018-08-15 ENCOUNTER — Ambulatory Visit (INDEPENDENT_AMBULATORY_CARE_PROVIDER_SITE_OTHER): Payer: Medicare Other

## 2018-08-15 DIAGNOSIS — I5022 Chronic systolic (congestive) heart failure: Secondary | ICD-10-CM

## 2018-08-15 DIAGNOSIS — Z95810 Presence of automatic (implantable) cardiac defibrillator: Secondary | ICD-10-CM

## 2018-08-16 NOTE — Progress Notes (Signed)
EPIC Encounter for ICM Monitoring  Patient Name: Shaka Cardin is a 83 y.o. male Date: 08/16/2018 Primary Care Physican: Doree Albee, MD Primary Cardiologist: Bronson Ing Electrophysiologist:Allred Nephrologist:[facility] Bi-V Pacing:99.8% 5/12/2020Weight:182 lbs   Transmission reviewed.  Spoke with patient.  He reports feeling well and is asymptomatic. He has been on vacation for past 10 days and not adhering to low salt diet.    Optivol Thoracic impedancesuggesting possible fluid accumulation but trending close to baseline.  Prescribed:No diuretic  Recommendations:Advised to limit salt intake and avoid restaurant foods if possible.  Encouraged to call if he develops fluid symptoms.  Follow-up plan: ICM clinic phone appointment on8/17/2020.   Copy of ICM check sent to Dr.Allred and Dr Bronson Ing.  3 month ICM trend: 08/15/2018    1 Year ICM trend:       Rosalene Billings, RN 08/16/2018 1:28 PM

## 2018-08-19 ENCOUNTER — Other Ambulatory Visit: Payer: Self-pay | Admitting: *Deleted

## 2018-08-19 ENCOUNTER — Other Ambulatory Visit: Payer: Self-pay

## 2018-08-19 ENCOUNTER — Encounter: Payer: Medicare Other | Admitting: Urology

## 2018-08-19 DIAGNOSIS — N138 Other obstructive and reflux uropathy: Secondary | ICD-10-CM

## 2018-08-19 DIAGNOSIS — N401 Enlarged prostate with lower urinary tract symptoms: Secondary | ICD-10-CM

## 2018-08-19 MED ORDER — NITROGLYCERIN 0.4 MG SL SUBL: 0.4000 mg | SUBLINGUAL_TABLET | SUBLINGUAL | 3 refills | Status: DC | PRN

## 2018-09-13 DIAGNOSIS — W19XXXA Unspecified fall, initial encounter: Secondary | ICD-10-CM | POA: Diagnosis not present

## 2018-09-13 DIAGNOSIS — W010XXA Fall on same level from slipping, tripping and stumbling without subsequent striking against object, initial encounter: Secondary | ICD-10-CM | POA: Diagnosis not present

## 2018-09-13 DIAGNOSIS — S0181XA Laceration without foreign body of other part of head, initial encounter: Secondary | ICD-10-CM | POA: Diagnosis not present

## 2018-09-13 DIAGNOSIS — Y998 Other external cause status: Secondary | ICD-10-CM | POA: Diagnosis not present

## 2018-09-13 DIAGNOSIS — S022XXA Fracture of nasal bones, initial encounter for closed fracture: Secondary | ICD-10-CM | POA: Diagnosis not present

## 2018-09-13 DIAGNOSIS — S0003XA Contusion of scalp, initial encounter: Secondary | ICD-10-CM | POA: Diagnosis not present

## 2018-09-13 DIAGNOSIS — Z23 Encounter for immunization: Secondary | ICD-10-CM | POA: Diagnosis not present

## 2018-09-17 DIAGNOSIS — E119 Type 2 diabetes mellitus without complications: Secondary | ICD-10-CM | POA: Diagnosis not present

## 2018-09-19 ENCOUNTER — Ambulatory Visit (INDEPENDENT_AMBULATORY_CARE_PROVIDER_SITE_OTHER): Payer: Medicare Other

## 2018-09-19 DIAGNOSIS — Z95810 Presence of automatic (implantable) cardiac defibrillator: Secondary | ICD-10-CM | POA: Diagnosis not present

## 2018-09-19 DIAGNOSIS — I5022 Chronic systolic (congestive) heart failure: Secondary | ICD-10-CM | POA: Diagnosis not present

## 2018-09-19 DIAGNOSIS — S0181XD Laceration without foreign body of other part of head, subsequent encounter: Secondary | ICD-10-CM | POA: Diagnosis not present

## 2018-09-20 ENCOUNTER — Telehealth: Payer: Self-pay

## 2018-09-20 NOTE — Telephone Encounter (Signed)
Remote ICM transmission received.  Attempted call to patient regarding ICM remote transmission and left message, per DPR, to return call.    

## 2018-09-20 NOTE — Progress Notes (Signed)
Patient returned call.  He is doing very well.  He had routine PCP appt last week and his blood sugars and blood pressure are normal.  Weight is stable at 182 lbs.  He is without complaints today.  He will be traveling to [place] to see his family for Thanksgiving.  No changes and encouraged to call if experiencing any fluid symptoms.

## 2018-09-20 NOTE — Progress Notes (Signed)
EPIC Encounter for ICM Monitoring  Patient Name: Lantz Hermann is a 83 y.o. male Date: 09/20/2018 Primary Care Physican: Doree Albee, MD Primary Cardiologist: Bronson Ing Electrophysiologist:Allred Nephrologist:[facility] Bi-V Pacing:99.9% LastWeight:182 lbs   Attempted call to patient and unable to reach.  Left message to return call. Transmission reviewed.   OptivolThoracic impedancesuggesting possible fluid accumulation but trending close to baseline.  Prescribed:No diuretic  Recommendations:Unable to reach.    Follow-up plan: ICM clinic phone appointment on 11/14/2018. 91 day device check 10/12/2018  Copy of ICM check sent to Dr.Allred.   3 month ICM trend: 09/19/2018    1 Year ICM trend:       Rosalene Billings, RN 09/20/2018 3:52 PM

## 2018-10-06 ENCOUNTER — Other Ambulatory Visit: Payer: Self-pay

## 2018-10-06 ENCOUNTER — Ambulatory Visit (INDEPENDENT_AMBULATORY_CARE_PROVIDER_SITE_OTHER): Payer: Medicare Other | Admitting: Internal Medicine

## 2018-10-06 ENCOUNTER — Encounter (INDEPENDENT_AMBULATORY_CARE_PROVIDER_SITE_OTHER): Payer: Self-pay | Admitting: Internal Medicine

## 2018-10-06 VITALS — BP 120/62 | HR 90 | Temp 97.4°F | Resp 19 | Ht 69.0 in | Wt 181.0 lb

## 2018-10-06 DIAGNOSIS — N183 Chronic kidney disease, stage 3 unspecified: Secondary | ICD-10-CM

## 2018-10-06 DIAGNOSIS — I10 Essential (primary) hypertension: Secondary | ICD-10-CM | POA: Diagnosis not present

## 2018-10-06 DIAGNOSIS — N401 Enlarged prostate with lower urinary tract symptoms: Secondary | ICD-10-CM

## 2018-10-06 DIAGNOSIS — E785 Hyperlipidemia, unspecified: Secondary | ICD-10-CM | POA: Diagnosis not present

## 2018-10-06 DIAGNOSIS — R351 Nocturia: Secondary | ICD-10-CM | POA: Diagnosis not present

## 2018-10-06 DIAGNOSIS — E1122 Type 2 diabetes mellitus with diabetic chronic kidney disease: Secondary | ICD-10-CM | POA: Diagnosis not present

## 2018-10-06 DIAGNOSIS — E559 Vitamin D deficiency, unspecified: Secondary | ICD-10-CM | POA: Diagnosis not present

## 2018-10-06 DIAGNOSIS — I2510 Atherosclerotic heart disease of native coronary artery without angina pectoris: Secondary | ICD-10-CM

## 2018-10-06 DIAGNOSIS — I255 Ischemic cardiomyopathy: Secondary | ICD-10-CM | POA: Diagnosis not present

## 2018-10-06 NOTE — Patient Instructions (Signed)

## 2018-10-06 NOTE — Progress Notes (Signed)
Subjective:  Patient ID: Omar Williams, male    DOB: 1935-07-03  Age: 83 y.o. MRN: [id number]  CC: This delightful man comes in for follow-up of his multiple medical problems including diabetes, hypertension, hyperlipidemia, coronary artery disease, vitamin D deficiency and BPH.   HPI He is doing reasonably well.  As far as his diabetes is concerned, he does tend to do intermittent fasting for approximately 16 hours every day.  His last hemoglobin A1c 3 months ago was about 7.3%.  He continues to take oral hypoglycemic agents.  He denies any polyuria or polydipsia.  There is no symptoms of peripheral neuropathy.  He does have nephropathy for which he sees a new nephrologist. He does have a history of coronary artery disease but thankfully has had no further problems with this.  He denies any chest pain, dyspnea or palpitations. He continues on statin therapy for hyperlipidemia in the face of coronary artery disease and diabetes.  He denies any myalgias. He continues on medications for his BPH.  He also continues on vitamin D3 supplementation 5000 units daily, which is an increase from previous as his vitamin D levels were not optimal.    Past Medical History:  Diagnosis Date  . Acid reflux   . Blood transfusion without reported diagnosis   . Cardiac arrest ([place])    a. occuring in 12/2016. LAD disease but no targets for CABG or PCI. Underwent ICD placement as EF 15-20%.   . CHF (congestive heart failure) ([place])   . Coronary artery disease   . Diabetes mellitus without complication ([place])   . Hyperlipidemia   . Hypertension   . Myocardial infarction ([place])   . Renal disorder      Social History   Social History Narrative   Lives with with wife [name]   Married 82 years   Daughter [name] stays frequently   Granddaughter [name] near by   Social History   Substance and Sexual Activity  Alcohol Use No    Social History   Tobacco Use  Smoking Status Never Smoker  Smokeless  Tobacco Never Used    Married   Current Meds  Medication Sig  . aspirin EC 81 MG tablet Take 81 mg by mouth daily.  Marland Kitchen atorvastatin (LIPITOR) 80 MG tablet Take 1 tablet (80 mg total) by mouth at bedtime.  Raelyn Ensign Pollen 550 MG CAPS Take 1 capsule by mouth daily.  . blood glucose meter kit and supplies KIT Test daily.  Dx e11.22  . carvedilol (COREG) 6.25 MG tablet Take 1 tablet (6.25 mg total) by mouth 2 (two) times daily.  . Cholecalciferol (VITAMIN D3) 125 MCG (5000 UT) TABS Take 1 tablet by mouth daily.   Marland Kitchen ezetimibe (ZETIA) 10 MG tablet Take 1 tablet (10 mg total) by mouth daily.  . finasteride (PROSCAR) 5 MG tablet Take 1 tablet (5 mg total) by mouth at bedtime.  Marland Kitchen glimepiride (AMARYL) 4 MG tablet Take 1 tablet (4 mg total) by mouth 2 (two) times daily.  Marland Kitchen glucose blood (COOL BLOOD GLUCOSE TEST STRIPS) test strip Use as instructed  . losartan (COZAAR) 50 MG tablet Take 1 tablet (50 mg total) by mouth 2 (two) times daily.  . Omega-3 Fatty Acids (FISH OIL) 1200 MG CAPS Take 1 capsule by mouth daily.  Marland Kitchen oxybutynin (DITROPAN) 5 MG tablet Take 2.5 mg by mouth 3 (three) times daily.  . tamsulosin (FLOMAX) 0.4 MG CAPS capsule Take 1 capsule (0.4 mg total) by mouth  at bedtime.  . TURMERIC PO Take 538 mg by mouth daily.  . vitamin B-12 (CYANOCOBALAMIN) 1000 MCG tablet Take 1,000 mcg by mouth daily.       Objective:   Today's Vitals: BP 120/62 (BP Location: Right Arm, Patient Position: Sitting, Cuff Size: Normal)   Pulse 90   Temp (!) 97.4 F (36.3 C) (Temporal)   Resp 19   Ht _0  (1.753 m)   Wt 181 lb (82.1 kg)   BMI 26.73 kg/m  Vitals with BMI 10/06/2018 06/07/2018 04/08/2018  Height _1  _2  _3   Weight 181 lbs 182 lbs 184 lbs 10 oz  BMI 26.72 35.39 12.25  Systolic 834 621 947  Diastolic 62 68 68  Pulse 90 66 62       Physical Exam  He looks systemically well.  Blood pressure is well controlled.  He is alert and orientated without any focal neurological signs.   Assessment     1. Type 2 diabetes mellitus with stage 3 chronic kidney disease, without long-term current use of insulin ([place])   2. Essential hypertension   3. Hyperlipidemia LDL goal <70   4. Coronary artery disease involving native coronary artery of native heart without angina pectoris   5. Vitamin D deficiency   6. BPH associated with nocturia       Plan 1.  He will continue with all medications for his chronic conditions above. 2.  Blood was taken for complete metabolic panel, hemoglobin A1c and vitamin D levels. 3.  Further recommendations will depend on blood results and I will see him for follow-up in about 3 months time.  Doree Albee, MD

## 2018-10-07 LAB — COMPLETE METABOLIC PANEL WITH GFR
AG Ratio: 1.4 (calc) (ref 1.0–2.5)
ALT: 20 U/L (ref 9–46)
AST: 18 U/L (ref 10–35)
Albumin: 4.2 g/dL (ref 3.6–5.1)
Alkaline phosphatase (APISO): 60 U/L (ref 35–144)
BUN/Creatinine Ratio: 14 (calc) (ref 6–22)
BUN: 22 mg/dL (ref 7–25)
CO2: 24 mmol/L (ref 20–32)
Calcium: 10.1 mg/dL (ref 8.6–10.3)
Chloride: 108 mmol/L (ref 98–110)
Creat: 1.55 mg/dL — ABNORMAL HIGH (ref 0.70–1.11)
GFR, Est African American: 48 mL/min/{1.73_m2} — ABNORMAL LOW (ref >=60)
GFR, Est Non African American: 41 mL/min/{1.73_m2} — ABNORMAL LOW (ref >=60)
Globulin: 2.9 g/dL (calc) (ref 1.9–3.7)
Glucose, Bld: 99 mg/dL (ref 65–99)
Potassium: 4.7 mmol/L (ref 3.5–5.3)
Sodium: 143 mmol/L (ref 135–146)
Total Bilirubin: 1.1 mg/dL (ref 0.2–1.2)
Total Protein: 7.1 g/dL (ref 6.1–8.1)

## 2018-10-07 LAB — HEMOGLOBIN A1C
Hgb A1c MFr Bld: 7.4 % of total Hgb — ABNORMAL HIGH (ref <5.7)
Mean Plasma Glucose: 166 (calc)
eAG (mmol/L): 9.2 (calc)

## 2018-10-07 LAB — VITAMIN D 25 HYDROXY (VIT D DEFICIENCY, FRACTURES): Vit D, 25-Hydroxy: 66 ng/mL (ref 30–100)

## 2018-10-07 NOTE — Progress Notes (Signed)
His blood work is stable.  Continue with all medications.  Follow-up as scheduled.

## 2018-10-12 ENCOUNTER — Ambulatory Visit (INDEPENDENT_AMBULATORY_CARE_PROVIDER_SITE_OTHER): Payer: Medicare Other | Admitting: *Deleted

## 2018-10-12 DIAGNOSIS — I255 Ischemic cardiomyopathy: Secondary | ICD-10-CM | POA: Diagnosis not present

## 2018-10-12 DIAGNOSIS — I5023 Acute on chronic systolic (congestive) heart failure: Secondary | ICD-10-CM

## 2018-10-12 LAB — CUP PACEART REMOTE DEVICE CHECK
Battery Remaining Longevity: 82 mo
Battery Voltage: 2.99 V
Brady Statistic AP VP Percent: 62.38 %
Brady Statistic AP VS Percent: 0.02 %
Brady Statistic AS VP Percent: 37.38 %
Brady Statistic AS VS Percent: 0.22 %
Brady Statistic RA Percent Paced: 62.4 %
Brady Statistic RV Percent Paced: 95.66 %
Date Time Interrogation Session: 20200909052403
HighPow Impedance: 84 Ohm
Implantable Lead Implant Date: 20181130
Implantable Lead Implant Date: 20181130
Implantable Lead Implant Date: 20181130
Implantable Lead Location: 753858
Implantable Lead Location: 753859
Implantable Lead Location: 753860
Implantable Lead Model: 4598
Implantable Lead Model: 5076
Implantable Lead Model: 6935
Implantable Pulse Generator Implant Date: 20181130
Lead Channel Impedance Value: 141.867
Lead Channel Impedance Value: 141.867
Lead Channel Impedance Value: 145.871
Lead Channel Impedance Value: 156.606
Lead Channel Impedance Value: 156.606
Lead Channel Impedance Value: 266 Ohm
Lead Channel Impedance Value: 304 Ohm
Lead Channel Impedance Value: 304 Ohm
Lead Channel Impedance Value: 323 Ohm
Lead Channel Impedance Value: 399 Ohm
Lead Channel Impedance Value: 399 Ohm
Lead Channel Impedance Value: 494 Ohm
Lead Channel Impedance Value: 494 Ohm
Lead Channel Impedance Value: 513 Ohm
Lead Channel Impedance Value: 532 Ohm
Lead Channel Impedance Value: 532 Ohm
Lead Channel Impedance Value: 627 Ohm
Lead Channel Impedance Value: 665 Ohm
Lead Channel Pacing Threshold Amplitude: 0.375 V
Lead Channel Pacing Threshold Amplitude: 0.875 V
Lead Channel Pacing Threshold Amplitude: 0.875 V
Lead Channel Pacing Threshold Pulse Width: 0.4 ms
Lead Channel Pacing Threshold Pulse Width: 0.4 ms
Lead Channel Pacing Threshold Pulse Width: 0.4 ms
Lead Channel Sensing Intrinsic Amplitude: 10.375 mV
Lead Channel Sensing Intrinsic Amplitude: 10.375 mV
Lead Channel Sensing Intrinsic Amplitude: 2.5 mV
Lead Channel Sensing Intrinsic Amplitude: 2.5 mV
Lead Channel Setting Pacing Amplitude: 1.5 V
Lead Channel Setting Pacing Amplitude: 2 V
Lead Channel Setting Pacing Amplitude: 2.5 V
Lead Channel Setting Pacing Pulse Width: 0.4 ms
Lead Channel Setting Pacing Pulse Width: 0.4 ms
Lead Channel Setting Sensing Sensitivity: 0.3 mV

## 2018-10-17 DIAGNOSIS — Z23 Encounter for immunization: Secondary | ICD-10-CM | POA: Diagnosis not present

## 2018-10-25 ENCOUNTER — Other Ambulatory Visit (INDEPENDENT_AMBULATORY_CARE_PROVIDER_SITE_OTHER): Payer: Self-pay | Admitting: Internal Medicine

## 2018-10-25 ENCOUNTER — Telehealth (INDEPENDENT_AMBULATORY_CARE_PROVIDER_SITE_OTHER): Payer: Self-pay | Admitting: Internal Medicine

## 2018-10-25 MED ORDER — CARVEDILOL 6.25 MG PO TABS: 6.2500 mg | ORAL_TABLET | Freq: Two times a day (BID) | ORAL | 0 refills | Status: DC

## 2018-10-25 NOTE — Telephone Encounter (Signed)
Done

## 2018-10-27 NOTE — Progress Notes (Signed)
Remote ICD transmission.   

## 2018-11-08 ENCOUNTER — Other Ambulatory Visit (INDEPENDENT_AMBULATORY_CARE_PROVIDER_SITE_OTHER): Payer: Self-pay | Admitting: Internal Medicine

## 2018-11-08 ENCOUNTER — Telehealth (INDEPENDENT_AMBULATORY_CARE_PROVIDER_SITE_OTHER): Payer: Self-pay

## 2018-11-08 MED ORDER — ATORVASTATIN CALCIUM 80 MG PO TABS: 80.0000 mg | ORAL_TABLET | Freq: Every day | ORAL | 1 refills | Status: DC

## 2018-11-08 NOTE — Telephone Encounter (Signed)
Pt called requesting refill on atorvastatin (LIPITOR) 80 MG tablet [[id number]]  Please call to [facility]

## 2018-11-14 ENCOUNTER — Ambulatory Visit (INDEPENDENT_AMBULATORY_CARE_PROVIDER_SITE_OTHER): Payer: Medicare Other

## 2018-11-14 DIAGNOSIS — I5022 Chronic systolic (congestive) heart failure: Secondary | ICD-10-CM

## 2018-11-14 DIAGNOSIS — Z95810 Presence of automatic (implantable) cardiac defibrillator: Secondary | ICD-10-CM | POA: Diagnosis not present

## 2018-11-15 ENCOUNTER — Telehealth: Payer: Self-pay

## 2018-11-15 NOTE — Telephone Encounter (Signed)
Remote ICM transmission received.  Attempted call to patient regarding ICM remote transmission and no answer.  

## 2018-11-15 NOTE — Progress Notes (Signed)
EPIC Encounter for ICM Monitoring  Patient Name: Omar Williams is a 83 y.o. male Date: 11/15/2018 Primary Care Physican: Doree Albee, MD Primary Cardiologist: Bronson Ing Electrophysiologist:Allred Nephrologist:[facility] Bi-V Pacing:99.7% 11/15/2018 Weight:180 lbs   Spoke with patient.  He said he is doing well. Blood sugars have been well controlled.  He leaves for [place] on 12/21/2018 and will be taking monitor with him.   OptivolThoracic impedancenormal.  Prescribed:No diuretic  Recommendations: No changes and encouraged to call if experiencing any fluid symptoms.  Follow-up plan: ICM clinic phone appointment on 12/19/2018.   91 day device clinic remote transmission 01/11/2019.  Office appt 12/14/2018 with Dr. Bronson Ing.    Copy of ICM check sent to Dr. Rayann Heman.   3 month ICM trend: 11/14/2018    1 Year ICM trend:       Rosalene Billings, RN 11/15/2018 2:48 PM

## 2018-11-29 DIAGNOSIS — N1832 Chronic kidney disease, stage 3b: Secondary | ICD-10-CM | POA: Diagnosis not present

## 2018-11-29 DIAGNOSIS — E559 Vitamin D deficiency, unspecified: Secondary | ICD-10-CM | POA: Diagnosis not present

## 2018-11-29 DIAGNOSIS — D631 Anemia in chronic kidney disease: Secondary | ICD-10-CM | POA: Diagnosis not present

## 2018-11-29 DIAGNOSIS — I10 Essential (primary) hypertension: Secondary | ICD-10-CM | POA: Diagnosis not present

## 2018-11-29 DIAGNOSIS — R809 Proteinuria, unspecified: Secondary | ICD-10-CM | POA: Diagnosis not present

## 2018-11-29 DIAGNOSIS — Z79899 Other long term (current) drug therapy: Secondary | ICD-10-CM | POA: Diagnosis not present

## 2018-12-07 DIAGNOSIS — N1832 Chronic kidney disease, stage 3b: Secondary | ICD-10-CM | POA: Insufficient documentation

## 2018-12-08 DIAGNOSIS — E559 Vitamin D deficiency, unspecified: Secondary | ICD-10-CM | POA: Diagnosis not present

## 2018-12-08 DIAGNOSIS — N189 Chronic kidney disease, unspecified: Secondary | ICD-10-CM | POA: Diagnosis not present

## 2018-12-08 DIAGNOSIS — R809 Proteinuria, unspecified: Secondary | ICD-10-CM | POA: Diagnosis not present

## 2018-12-08 DIAGNOSIS — E1129 Type 2 diabetes mellitus with other diabetic kidney complication: Secondary | ICD-10-CM | POA: Diagnosis not present

## 2018-12-08 DIAGNOSIS — I129 Hypertensive chronic kidney disease with stage 1 through stage 4 chronic kidney disease, or unspecified chronic kidney disease: Secondary | ICD-10-CM | POA: Diagnosis not present

## 2018-12-08 DIAGNOSIS — E1122 Type 2 diabetes mellitus with diabetic chronic kidney disease: Secondary | ICD-10-CM | POA: Diagnosis not present

## 2018-12-13 ENCOUNTER — Telehealth (INDEPENDENT_AMBULATORY_CARE_PROVIDER_SITE_OTHER): Payer: Self-pay | Admitting: Internal Medicine

## 2018-12-13 ENCOUNTER — Telehealth: Payer: Self-pay | Admitting: Cardiovascular Disease

## 2018-12-13 ENCOUNTER — Other Ambulatory Visit (INDEPENDENT_AMBULATORY_CARE_PROVIDER_SITE_OTHER): Payer: Self-pay | Admitting: Internal Medicine

## 2018-12-13 MED ORDER — GLIMEPIRIDE 4 MG PO TABS: 4.0000 mg | ORAL_TABLET | Freq: Two times a day (BID) | ORAL | 0 refills | Status: DC

## 2018-12-13 MED ORDER — LOSARTAN POTASSIUM 50 MG PO TABS: 50.0000 mg | ORAL_TABLET | Freq: Two times a day (BID) | ORAL | 0 refills | Status: DC

## 2018-12-13 NOTE — Telephone Encounter (Signed)
Virtual Visit Pre-Appointment Phone Call  "(Name), I am calling you today to discuss your upcoming appointment. We are currently trying to limit exposure to the virus that causes COVID-19 by seeing patients at home rather than in the office."  1. "What is the BEST phone number to call the day of the visit?"   2. Do you have or have access to (through a family member/friend) a smartphone with video capability that we can use for your visit?" a. If yes - list this number in appt notes as cell (if different from BEST phone #) and list the appointment type as a VIDEO visit in appointment notes b. If no - list the appointment type as a PHONE visit in appointment notes  3. Confirm consent - "In the setting of the current Covid19 crisis, you are scheduled for a (phone or video) visit with your provider on (date) at (time).  Just as we do with many in-office visits, in order for you to participate in this visit, we must obtain consent.  If you'd like, I can send this to your mychart (if signed up) or email for you to review.  Otherwise, I can obtain your verbal consent now.  All virtual visits are billed to your insurance company just like a normal visit would be.  By agreeing to a virtual visit, we'd like you to understand that the technology does not allow for your provider to perform an examination, and thus may limit your provider's ability to fully assess your condition. If your provider identifies any concerns that need to be evaluated in person, we will make arrangements to do so.  Finally, though the technology is pretty good, we cannot assure that it will always work on either your or our end, and in the setting of a video visit, we may have to convert it to a phone-only visit.  In either situation, we cannot ensure that we have a secure connection.  Are you willing to proceed?" STAFF: Did the patient verbally acknowledge consent to telehealth visit? Document YES/NO here: YES   4. Advise  patient to be prepared - "Two hours prior to your appointment, go ahead and check your blood pressure, pulse, oxygen saturation, and your weight (if you have the equipment to check those) and write them all down. When your visit starts, your provider will ask you for this information. If you have an Apple Watch or Kardia device, please plan to have heart rate information ready on the day of your appointment. Please have a pen and paper handy nearby the day of the visit as well."  5. Give patient instructions for MyChart download to smartphone OR Doximity/Doxy.me as below if video visit (depending on what platform provider is using)  6. Inform patient they will receive a phone call 15 minutes prior to their appointment time (may be from unknown caller ID) so they should be prepared to answer    TELEPHONE CALL NOTE  Omar Williams has been deemed a candidate for a follow-up tele-health visit to limit community exposure during the Covid-19 pandemic. I spoke with the patient via phone to ensure availability of phone/video source, confirm preferred email & phone number, and discuss instructions and expectations.  I reminded Omar Williams to be prepared with any vital sign and/or heart rhythm information that could potentially be obtained via home monitoring, at the time of his visit. I reminded Omar Williams to expect a phone call prior to his visit.  Lynnda Child Slaughter 12/13/2018  8:35 AM   INSTRUCTIONS FOR DOWNLOADING THE MYCHART APP TO SMARTPHONE  - The patient must first make sure to have activated MyChart and know their login information - If Apple, go to [employer] and type in MyChart in the search bar and download the app. If Android, ask patient to go to [employer] and type in [place] in the search bar and download the app. The app is free but as with any other app downloads, their phone may require them to verify saved payment information or Apple/Android password.  - The patient will need to then  log into the app with their MyChart username and password, and select [facility] as their healthcare provider to link the account. When it is time for your visit, go to the MyChart app, find appointments, and click Begin Video Visit. Be sure to Select Allow for your device to access the Microphone and Camera for your visit. You will then be connected, and your provider will be with you shortly.  **If they have any issues connecting, or need assistance please contact MyChart service desk [phone])**  **If using a computer, in order to ensure the best quality for their visit they will need to use either of the following Internet Browsers: [employer] or Google Chrome**  IF USING DOXIMITY or DOXY.ME - The patient will receive a link just prior to their visit by text.     FULL LENGTH CONSENT FOR TELE-HEALTH VISIT   I hereby voluntarily request, consent and authorize [facility] and its employed or contracted physicians, physician assistants, nurse practitioners or other licensed health care professionals (the Practitioner), to provide me with telemedicine health care services (the Services") as deemed necessary by the treating Practitioner. I acknowledge and consent to receive the Services by the Practitioner via telemedicine. I understand that the telemedicine visit will involve communicating with the Practitioner through live audiovisual communication technology and the disclosure of certain medical information by electronic transmission. I acknowledge that I have been given the opportunity to request an in-person assessment or other available alternative prior to the telemedicine visit and am voluntarily participating in the telemedicine visit.  I understand that I have the right to withhold or withdraw my consent to the use of telemedicine in the course of my care at any time, without affecting my right to future care or treatment, and that the Practitioner or I may  terminate the telemedicine visit at any time. I understand that I have the right to inspect all information obtained and/or recorded in the course of the telemedicine visit and may receive copies of available information for a reasonable fee.  I understand that some of the potential risks of receiving the Services via telemedicine include:   Delay or interruption in medical evaluation due to technological equipment failure or disruption;  Information transmitted may not be sufficient (e.g. poor resolution of images) to allow for appropriate medical decision making by the Practitioner; and/or   In rare instances, security protocols could fail, causing a breach of personal health information.  Furthermore, I acknowledge that it is my responsibility to provide information about my medical history, conditions and care that is complete and accurate to the best of my ability. I acknowledge that Practitioner's advice, recommendations, and/or decision may be based on factors not within their control, such as incomplete or inaccurate data provided by me or distortions of diagnostic images or specimens that may result from electronic transmissions. I understand that the practice of medicine is  not an Chief Strategy Officer and that Practitioner makes no warranties or guarantees regarding treatment outcomes. I acknowledge that I will receive a copy of this consent concurrently upon execution via email to the email address I last provided but may also request a printed copy by calling the office of [facility].    I understand that my insurance will be billed for this visit.   I have read or had this consent read to me.  I understand the contents of this consent, which adequately explains the benefits and risks of the Services being provided via telemedicine.   I have been provided ample opportunity to ask questions regarding this consent and the Services and have had my questions answered to my satisfaction.  I  give my informed consent for the services to be provided through the use of telemedicine in my medical care  By participating in this telemedicine visit I agree to the above.

## 2018-12-13 NOTE — Telephone Encounter (Signed)
done

## 2018-12-14 ENCOUNTER — Encounter: Payer: Self-pay | Admitting: Cardiovascular Disease

## 2018-12-14 ENCOUNTER — Telehealth (INDEPENDENT_AMBULATORY_CARE_PROVIDER_SITE_OTHER): Payer: Medicare Other | Admitting: Cardiovascular Disease

## 2018-12-14 VITALS — BP 119/73 | Ht 67.0 in | Wt 180.0 lb

## 2018-12-14 DIAGNOSIS — Z95810 Presence of automatic (implantable) cardiac defibrillator: Secondary | ICD-10-CM

## 2018-12-14 DIAGNOSIS — I5022 Chronic systolic (congestive) heart failure: Secondary | ICD-10-CM | POA: Diagnosis not present

## 2018-12-14 DIAGNOSIS — I255 Ischemic cardiomyopathy: Secondary | ICD-10-CM

## 2018-12-14 DIAGNOSIS — I25118 Atherosclerotic heart disease of native coronary artery with other forms of angina pectoris: Secondary | ICD-10-CM

## 2018-12-14 DIAGNOSIS — E785 Hyperlipidemia, unspecified: Secondary | ICD-10-CM

## 2018-12-14 NOTE — Patient Instructions (Signed)

## 2018-12-14 NOTE — Progress Notes (Signed)
Virtual Visit via Telephone Note   This visit type was conducted due to national recommendations for restrictions regarding the COVID-19 Pandemic (e.g. social distancing) in an effort to limit this patient's exposure and mitigate transmission in our community.  Due to his co-morbid illnesses, this patient is at least at moderate risk for complications without adequate follow up.  This format is felt to be most appropriate for this patient at this time.  The patient did not have access to video technology/had technical difficulties with video requiring transitioning to audio format only (telephone).  All issues noted in this document were discussed and addressed.  No physical exam could be performed with this format.  Please refer to the patient's chart for his  consent to telehealth for [facility].   Date:  12/14/2018   ID:  Omar Williams, DOB October 07, 1935, MRN [id number]  Patient Location: Home Provider Location: Office  PCP:  Doree Albee, MD  Cardiologist:  Kate Sable, MD  Electrophysiologist:  Thompson Grayer, MD   Evaluation Performed:  Follow-Up Visit  Chief Complaint:  CAD  History of Present Illness:    Omar Williams is a 83 y.o. male with  a history of ischemic cardiomyopathy, biventricular ICD, and multivessel coronary artery disease.  He tries to walk on the track a few times per week.  The patient denies any symptoms of chest pain, palpitations, shortness of breath, lightheadedness, dizziness, leg swelling, orthopnea, PND, and syncope.  The patient does not have symptoms concerning for COVID-19 infection (fever, chills, cough, or new shortness of breath).   He is going to [facility] and will be there to the end of the month.  Social history:His wife, Omar Williams also my patient. He is a Company secretary.   Past Medical History:  Diagnosis Date  . Acid reflux   . Blood transfusion without reported diagnosis   . Cardiac arrest ([place])    a. occuring in 12/2016. LAD disease  but no targets for CABG or PCI. Underwent ICD placement as EF 15-20%.   . CHF (congestive heart failure) ([place])   . Coronary artery disease   . Diabetes mellitus without complication ([place])   . Hyperlipidemia   . Hypertension   . Myocardial infarction ([place])   . Renal disorder    Past Surgical History:  Procedure Laterality Date  . BIV ICD INSERTION CRT-D N/A 01/01/2017   Procedure: BIV ICD INSERTION CRT-D;  Surgeon: Constance Haw, MD;  Location: [place] [facility];  Service: Cardiovascular;  Laterality: N/A;     No outpatient medications have been marked as taking for the 12/14/18 encounter (Telemedicine) with Herminio Commons, MD.     Allergies:   Patient has no known allergies.   Social History   Tobacco Use  . Smoking status: Never Smoker  . Smokeless tobacco: Never Used  Substance Use Topics  . Alcohol use: No  . Drug use: No     Family Hx: The patient's family history includes CAD in his brother, brother, and sister; Diabetes in his brother, brother, daughter, and sister; Fibromyalgia in his daughter; Heart disease in his daughter; Hyperlipidemia in his brother, brother, daughter, mother, and sister; Hypertension in his brother, brother, daughter, mother, and sister; Multiple sclerosis in his daughter; Thyroid disease in his daughter and daughter.  ROS:   Please see the history of present illness.     All other systems reviewed and are negative.   Prior CV studies:   The following studies were reviewed today:  Echo  06/09/17:  Study Conclusions  - Left ventricle: The cavity size was normal. Wall thickness was   normal. Systolic function was mildly to moderately reduced. The   estimated ejection fraction was in the range of 40% to 45%.   Diffuse hypokinesis. Doppler parameters are consistent with   abnormal left ventricular relaxation (grade 1 diastolic   dysfunction). - Aortic valve: Poorly visualized. Probably trileaflet. There was   mild  regurgitation. - Mitral valve: There was trivial regurgitation. - Left atrium: The atrium was mildly dilated. - Right ventricle: Pacer wire or catheter noted in right ventricle. - Right atrium: Central venous pressure (est): 3 mm Hg. - Atrial septum: No defect or patent foramen ovale was identified. - Tricuspid valve: There was trivial regurgitation. - Pulmonary arteries: PA peak pressure: 13 mm Hg (S). - Pericardium, extracardiac: There was no pericardial effusion.  Impressions:  - Images are limited, but LVEF has improved in comparison to the   previous study in November 2018.  Labs/Other Tests and Data Reviewed:    EKG:  No ECG reviewed.  Recent Labs: 10/06/2018: ALT 20; BUN 22; Creat 1.55; Potassium 4.7; Sodium 143   Recent Lipid Panel Lab Results  Component Value Date/Time   CHOL 165 08/23/2017 09:17 AM   TRIG 110 08/23/2017 09:17 AM   HDL 49 08/23/2017 09:17 AM   CHOLHDL 3.4 08/23/2017 09:17 AM   LDLCALC 95 08/23/2017 09:17 AM    Wt Readings from Last 3 Encounters:  12/14/18 180 lb (81.6 kg)  10/06/18 181 lb (82.1 kg)  06/07/18 182 lb (82.6 kg)     Objective:    Vital Signs:  BP 119/73   Ht 5\' 7"  (1.702 m)   Wt 180 lb (81.6 kg)   BMI 28.19 kg/m    VITAL SIGNS:  reviewed  ASSESSMENT & PLAN:    1. Chronic systolic heart failure: Symptomatically stable.LVEF has improved to 40 to 45%. I will continue carvedilol 6.25 mg twice daily and losartan 50 mg twice daily.  2. Ischemic cardiomyopathy status post biventricular ICD in November 2018: Symptomatically stable. Followed by EP.LVEF has improved to 40 to 45% by echocardiogram on 06/09/2017.  3. Multivessel coronary artery disease: Stable ischemic heart disease. He has a tight proximal LAD stenosis with the remainder of the vessel being small in caliber. The circumflex had mild proximal narrowing in the RCA had no significant stenosis. There were no graftable targets nor good targets for PCI. Continue  aspirin, statin, and beta-blocker.  4. Hyperlipidemia: He is on high intensity statin therapy with Lipitor 80 mg and Zetia 10 mg daily.08/09/2017 on 07/05/18.He does eat fried and fatty foods and we talked about this.   COVID-19 Education: The signs and symptoms of COVID-19 were discussed with the patient and how to seek care for testing (follow up with PCP or arrange E-visit).  The importance of social distancing was discussed today.  Time:   Today, I have spent 10 minutes with the patient with telehealth technology discussing the above problems.     Medication Adjustments/Labs and Tests Ordered: Current medicines are reviewed at length with the patient today.  Concerns regarding medicines are outlined above.   Tests Ordered: No orders of the defined types were placed in this encounter.   Medication Changes: No orders of the defined types were placed in this encounter.   Follow Up:  Either In Person or Virtual in 6 month(s)  Signed, 09/04/18, MD  12/14/2018 10:38 AM    [facility]  [facility]  

## 2018-12-19 ENCOUNTER — Telehealth: Payer: Self-pay

## 2018-12-19 ENCOUNTER — Other Ambulatory Visit (INDEPENDENT_AMBULATORY_CARE_PROVIDER_SITE_OTHER): Payer: Self-pay | Admitting: Internal Medicine

## 2018-12-19 ENCOUNTER — Ambulatory Visit (INDEPENDENT_AMBULATORY_CARE_PROVIDER_SITE_OTHER): Payer: Medicare Other

## 2018-12-19 ENCOUNTER — Telehealth (INDEPENDENT_AMBULATORY_CARE_PROVIDER_SITE_OTHER): Payer: Self-pay | Admitting: Internal Medicine

## 2018-12-19 DIAGNOSIS — I5022 Chronic systolic (congestive) heart failure: Secondary | ICD-10-CM | POA: Diagnosis not present

## 2018-12-19 DIAGNOSIS — Z95810 Presence of automatic (implantable) cardiac defibrillator: Secondary | ICD-10-CM | POA: Diagnosis not present

## 2018-12-19 MED ORDER — LOSARTAN POTASSIUM 100 MG PO TABS: 50.0000 mg | ORAL_TABLET | Freq: Two times a day (BID) | ORAL | 0 refills | Status: DC

## 2018-12-19 NOTE — Telephone Encounter (Signed)
Remote ICM transmission received.  Attempted call to patient regarding ICM remote transmission and left message to return call   

## 2018-12-19 NOTE — Progress Notes (Signed)
EPIC Encounter for ICM Monitoring  Patient Name: Omar Williams is a 83 y.o. male Date: 12/19/2018 Primary Care Physican: Doree Albee, MD Primary Cardiologist: Bronson Ing Electrophysiologist:Allred Nephrologist:[facility] Bi-V Pacing:99.9% 11/15/2018 Weight:180 lbs   Attempted call to patient and unable to reach.  Left message to return call. Transmission reviewed.    OptivolThoracic impedancenormal.  Prescribed:No diuretic  Recommendations: Unable to reach.    Follow-up plan: ICM clinic phone appointment on 01/23/2019.   91 day device clinic remote transmission 01/11/2019.   Copy of ICM check sent to Dr. Rayann Heman and Dr Bronson Ing for review.   3 month ICM trend: 12/19/2018    1 Year ICM trend:        Rosalene Billings, RN 12/19/2018 1:22 PM

## 2018-12-19 NOTE — Telephone Encounter (Signed)
done

## 2018-12-20 NOTE — Progress Notes (Signed)
Received call back from patient.  Transmission reviewed.  He is doing well and will travel to [place] tomorrow for Thanksgiving with his family.  No changes and encouraged to call if experiencing any fluid symptoms.

## 2019-01-09 ENCOUNTER — Ambulatory Visit (INDEPENDENT_AMBULATORY_CARE_PROVIDER_SITE_OTHER): Payer: Medicare Other | Admitting: Internal Medicine

## 2019-01-09 ENCOUNTER — Encounter (INDEPENDENT_AMBULATORY_CARE_PROVIDER_SITE_OTHER): Payer: Self-pay | Admitting: Internal Medicine

## 2019-01-09 ENCOUNTER — Other Ambulatory Visit: Payer: Self-pay

## 2019-01-09 VITALS — BP 110/60 | HR 64 | Ht 69.0 in | Wt 181.0 lb

## 2019-01-09 DIAGNOSIS — N401 Enlarged prostate with lower urinary tract symptoms: Secondary | ICD-10-CM

## 2019-01-09 DIAGNOSIS — I255 Ischemic cardiomyopathy: Secondary | ICD-10-CM

## 2019-01-09 DIAGNOSIS — N183 Chronic kidney disease, stage 3 unspecified: Secondary | ICD-10-CM

## 2019-01-09 DIAGNOSIS — R351 Nocturia: Secondary | ICD-10-CM

## 2019-01-09 DIAGNOSIS — E1122 Type 2 diabetes mellitus with diabetic chronic kidney disease: Secondary | ICD-10-CM | POA: Diagnosis not present

## 2019-01-09 DIAGNOSIS — E559 Vitamin D deficiency, unspecified: Secondary | ICD-10-CM | POA: Diagnosis not present

## 2019-01-09 DIAGNOSIS — E785 Hyperlipidemia, unspecified: Secondary | ICD-10-CM

## 2019-01-09 DIAGNOSIS — I10 Essential (primary) hypertension: Secondary | ICD-10-CM

## 2019-01-09 NOTE — Progress Notes (Signed)
Metrics: Intervention Frequency ACO  Documented Smoking Status Yearly  Screened one or more times in 24 months  Cessation Counseling or  Active cessation medication Past 24 months  Past 24 months   Guideline developer: UpToDate (See UpToDate for funding source) Date Released: 2014       Wellness Office Visit  Subjective:  Patient ID: Omar Williams, male    DOB: [birth year]  Age: 83 y.o. MRN: [id number]  CC: This man comes in for follow-up of diabetes, hypertension, chronic kidney disease, vitamin D deficiency. HPI  He is doing reasonably well.  He continues on statin therapy for his hyperlipidemia in the face of diabetes.  He denies any myalgias. He has seen nephrology regarding his chronic kidney disease.  I do not have a note from him yet.  I think he was seen in November some time. He continues on his oral hypoglycemic agents.  He denies any polyuria or polydipsia. He continues on vitamin D3 supplementation for vitamin D deficiency. He continues with antihypertensive therapy.  He denies any chest pain, dyspnea or palpitations.  His weight is stable. Past Medical History:  Diagnosis Date  . Acid reflux   . Blood transfusion without reported diagnosis   . Cardiac arrest ([place])    a. occuring in 12/2016. LAD disease but no targets for CABG or PCI. Underwent ICD placement as EF 15-20%.   . CHF (congestive heart failure) ([place])   . Coronary artery disease   . Diabetes mellitus without complication ([place])   . Hyperlipidemia   . Hypertension   . Myocardial infarction ([place])   . Renal disorder       Family History  Problem Relation Age of Onset  . Hypertension Mother   . Hyperlipidemia Mother   . CAD Sister   . Diabetes Sister   . Hyperlipidemia Sister   . Hypertension Sister   . CAD Brother   . Diabetes Brother   . Hypertension Brother   . Hyperlipidemia Brother   . CAD Brother   . Diabetes Brother   . Hypertension Brother   . Hyperlipidemia Brother   . Thyroid disease  Daughter   . Hypertension Daughter   . Diabetes Daughter   . Hyperlipidemia Daughter   . Fibromyalgia Daughter   . Heart disease Daughter        stents  . Multiple sclerosis Daughter   . Thyroid disease Daughter     Social History   Social History Narrative   Lives with with wife [name]   Married 39 years   Daughter [name] stays frequently   Granddaughter [name] near by   Social History   Tobacco Use  . Smoking status: Never Smoker  . Smokeless tobacco: Never Used  Substance Use Topics  . Alcohol use: No    Current Meds  Medication Sig  . aspirin EC 81 MG tablet Take 81 mg by mouth daily.  Marland Kitchen atorvastatin (LIPITOR) 80 MG tablet Take 1 tablet (80 mg total) by mouth at bedtime.  Raelyn Ensign Pollen 550 MG CAPS Take 1 capsule by mouth 2 (two) times daily.   . blood glucose meter kit and supplies KIT Test daily.  Dx e11.22  . carvedilol (COREG) 6.25 MG tablet Take 1 tablet (6.25 mg total) by mouth 2 (two) times daily.  . Cholecalciferol (VITAMIN D3) 125 MCG (5000 UT) TABS Take 1 tablet by mouth daily.   Marland Kitchen ezetimibe (ZETIA) 10 MG tablet Take 1 tablet (10 mg total) by mouth daily.  . finasteride (  PROSCAR) 5 MG tablet Take 1 tablet (5 mg total) by mouth at bedtime.  Marland Kitchen glimepiride (AMARYL) 4 MG tablet Take 2 mg by mouth 2 (two) times daily.  Marland Kitchen glucose blood (COOL BLOOD GLUCOSE TEST STRIPS) test strip Use as instructed  . losartan (COZAAR) 100 MG tablet Take 0.5 tablets (50 mg total) by mouth 2 (two) times daily.  . nitroGLYCERIN (NITROSTAT) 0.4 MG SL tablet Place 1 tablet (0.4 mg total) under the tongue every 5 (five) minutes x 3 doses as needed for chest pain.  . Omega-3 Fatty Acids (FISH OIL) 1200 MG CAPS Take 1 capsule by mouth 2 (two) times daily.   Marland Kitchen oxybutynin (DITROPAN) 5 MG tablet Take 2.5 mg by mouth 3 (three) times daily.  . tamsulosin (FLOMAX) 0.4 MG CAPS capsule Take 0.4 mg by mouth every morning.  . vitamin B-12 (CYANOCOBALAMIN) 1000 MCG tablet Take 1,000 mcg by mouth  every morning.        Objective:   Today's Vitals: BP 110/60   Pulse 64   Ht '5\' 9"'  (1.753 m)   Wt 181 lb (82.1 kg)   BMI 26.73 kg/m  Vitals with BMI 01/09/2019 12/14/2018 10/06/2018  Height '5\' 9"'  '5\' 7"'  '5\' 9"'   Weight 181 lbs 180 lbs 181 lbs  BMI 26.72 14.10 30.13  Systolic 143 888 757  Diastolic 60 73 62  Pulse 64 - 90     Physical Exam   He looks systemically well.  Blood pressure is well controlled.  His weight is very stable.  No new physical findings today.    Assessment   1. Essential hypertension   2. Type 2 diabetes mellitus with stage 3 chronic kidney disease, without long-term current use of insulin, unspecified whether stage 3a or 3b CKD ([place])   3. Hyperlipidemia LDL goal <70   4. BPH associated with nocturia   5. Vitamin D deficiency       Tests ordered Orders Placed This Encounter  Procedures  . CMP with eGFR([facility])  . Hemoglobin A1c  . Vitamin D, 25-hydroxy     Plan: 1. Blood work is ordered as above. 2. He will continue with antihypertensive therapy and this seems to keep his blood pressure stable. 3. He will continue with oral hypoglycemic agents and we will see what his A1c is doing. 4. We will continue with statin therapy in the face of diabetes. 5. He will continue with vitamin D3 supplementation and we will check levels today. 6. I will have him follow-up with Sarah in about 3 months time.  Further recommendations will depend on blood results.   No orders of the defined types were placed in this encounter.   Doree Albee, MD

## 2019-01-10 LAB — COMPLETE METABOLIC PANEL WITH GFR
AG Ratio: 1.4 (calc) (ref 1.0–2.5)
ALT: 26 U/L (ref 9–46)
AST: 21 U/L (ref 10–35)
Albumin: 4.4 g/dL (ref 3.6–5.1)
Alkaline phosphatase (APISO): 59 U/L (ref 35–144)
BUN/Creatinine Ratio: 16 (calc) (ref 6–22)
BUN: 23 mg/dL (ref 7–25)
CO2: 26 mmol/L (ref 20–32)
Calcium: 9.7 mg/dL (ref 8.6–10.3)
Chloride: 106 mmol/L (ref 98–110)
Creat: 1.4 mg/dL — ABNORMAL HIGH (ref 0.70–1.11)
GFR, Est African American: 54 mL/min/{1.73_m2} — ABNORMAL LOW (ref >=60)
GFR, Est Non African American: 47 mL/min/{1.73_m2} — ABNORMAL LOW (ref >=60)
Globulin: 3.2 g/dL (calc) (ref 1.9–3.7)
Glucose, Bld: 140 mg/dL — ABNORMAL HIGH (ref 65–99)
Potassium: 4.7 mmol/L (ref 3.5–5.3)
Sodium: 140 mmol/L (ref 135–146)
Total Bilirubin: 1.2 mg/dL (ref 0.2–1.2)
Total Protein: 7.6 g/dL (ref 6.1–8.1)

## 2019-01-10 LAB — VITAMIN D 25 HYDROXY (VIT D DEFICIENCY, FRACTURES): Vit D, 25-Hydroxy: 75 ng/mL (ref 30–100)

## 2019-01-10 LAB — HEMOGLOBIN A1C
Hgb A1c MFr Bld: 7.7 % of total Hgb — ABNORMAL HIGH (ref <5.7)
Mean Plasma Glucose: 174 (calc)
eAG (mmol/L): 9.7 (calc)

## 2019-01-11 ENCOUNTER — Ambulatory Visit (INDEPENDENT_AMBULATORY_CARE_PROVIDER_SITE_OTHER): Payer: Medicare Other | Admitting: *Deleted

## 2019-01-11 DIAGNOSIS — I255 Ischemic cardiomyopathy: Secondary | ICD-10-CM | POA: Diagnosis not present

## 2019-01-11 LAB — CUP PACEART REMOTE DEVICE CHECK
Battery Remaining Longevity: 77 mo
Battery Voltage: 2.99 V
Brady Statistic AP VP Percent: 65.62 %
Brady Statistic AP VS Percent: 0.02 %
Brady Statistic AS VP Percent: 34.29 %
Brady Statistic AS VS Percent: 0.07 %
Brady Statistic RA Percent Paced: 65.64 %
Brady Statistic RV Percent Paced: 99.56 %
Date Time Interrogation Session: 20201209012204
HighPow Impedance: 83 Ohm
Implantable Lead Implant Date: 20181130
Implantable Lead Implant Date: 20181130
Implantable Lead Implant Date: 20181130
Implantable Lead Location: 753858
Implantable Lead Location: 753859
Implantable Lead Location: 753860
Implantable Lead Model: 4598
Implantable Lead Model: 5076
Implantable Lead Model: 6935
Implantable Pulse Generator Implant Date: 20181130
Lead Channel Impedance Value: 141.867
Lead Channel Impedance Value: 141.867
Lead Channel Impedance Value: 145.871
Lead Channel Impedance Value: 156.606
Lead Channel Impedance Value: 156.606
Lead Channel Impedance Value: 266 Ohm
Lead Channel Impedance Value: 304 Ohm
Lead Channel Impedance Value: 304 Ohm
Lead Channel Impedance Value: 323 Ohm
Lead Channel Impedance Value: 380 Ohm
Lead Channel Impedance Value: 399 Ohm
Lead Channel Impedance Value: 494 Ohm
Lead Channel Impedance Value: 494 Ohm
Lead Channel Impedance Value: 494 Ohm
Lead Channel Impedance Value: 494 Ohm
Lead Channel Impedance Value: 513 Ohm
Lead Channel Impedance Value: 646 Ohm
Lead Channel Impedance Value: 722 Ohm
Lead Channel Pacing Threshold Amplitude: 0.375 V
Lead Channel Pacing Threshold Amplitude: 0.875 V
Lead Channel Pacing Threshold Amplitude: 0.875 V
Lead Channel Pacing Threshold Pulse Width: 0.4 ms
Lead Channel Pacing Threshold Pulse Width: 0.4 ms
Lead Channel Pacing Threshold Pulse Width: 0.4 ms
Lead Channel Sensing Intrinsic Amplitude: 2.625 mV
Lead Channel Sensing Intrinsic Amplitude: 2.625 mV
Lead Channel Sensing Intrinsic Amplitude: 9.875 mV
Lead Channel Sensing Intrinsic Amplitude: 9.875 mV
Lead Channel Setting Pacing Amplitude: 1.5 V
Lead Channel Setting Pacing Amplitude: 2 V
Lead Channel Setting Pacing Amplitude: 2.5 V
Lead Channel Setting Pacing Pulse Width: 0.4 ms
Lead Channel Setting Pacing Pulse Width: 0.4 ms
Lead Channel Setting Sensing Sensitivity: 0.3 mV

## 2019-01-23 ENCOUNTER — Ambulatory Visit (INDEPENDENT_AMBULATORY_CARE_PROVIDER_SITE_OTHER): Payer: Medicare Other

## 2019-01-23 ENCOUNTER — Other Ambulatory Visit (INDEPENDENT_AMBULATORY_CARE_PROVIDER_SITE_OTHER): Payer: Self-pay | Admitting: Internal Medicine

## 2019-01-23 ENCOUNTER — Telehealth (INDEPENDENT_AMBULATORY_CARE_PROVIDER_SITE_OTHER): Payer: Self-pay

## 2019-01-23 DIAGNOSIS — I5022 Chronic systolic (congestive) heart failure: Secondary | ICD-10-CM | POA: Diagnosis not present

## 2019-01-23 DIAGNOSIS — Z95810 Presence of automatic (implantable) cardiac defibrillator: Secondary | ICD-10-CM

## 2019-01-23 MED ORDER — CARVEDILOL 6.25 MG PO TABS: 6.2500 mg | ORAL_TABLET | Freq: Two times a day (BID) | ORAL | 0 refills | Status: DC

## 2019-01-23 NOTE — Telephone Encounter (Signed)
Pt needs refill on Carvedilol 6.25 mg - please call to [facility]

## 2019-01-24 NOTE — Progress Notes (Signed)
EPIC Encounter for ICM Monitoring  Patient Name: Omar Williams is a 83 y.o. male Date: 01/24/2019 Primary Care Physican: Doree Albee, MD Primary Cardiologist: Bronson Ing Electrophysiologist:Allred Nephrologist:[facility] Bi-V Pacing:99.8% 12/22/2020Weight:181lbs  Spoke with patient and has no fluid symptoms.  He reports feeling fine at this time.   OptivolThoracic impedancenormal.  Prescribed:No diuretic  Recommendations: No changes and encouraged to call if experiencing any fluid symptoms.No changes and encouraged to call if experiencing any fluid symptoms.  Follow-up plan: ICM clinic phone appointment on 02/27/2019.   91 day device clinic remote transmission 04/12/2019.  Office appt 04/07/2019 with Dr. Rayann Heman.    Copy of ICM check sent to Dr. Rayann Heman.   3 month ICM trend: 01/23/2019    1 Year ICM trend:       Rosalene Billings, RN 01/24/2019 10:14 AM

## 2019-02-16 ENCOUNTER — Other Ambulatory Visit: Payer: Self-pay | Admitting: Cardiovascular Disease

## 2019-02-18 NOTE — Progress Notes (Signed)
ICD remote 

## 2019-02-27 ENCOUNTER — Other Ambulatory Visit (INDEPENDENT_AMBULATORY_CARE_PROVIDER_SITE_OTHER): Payer: Self-pay | Admitting: Internal Medicine

## 2019-02-27 ENCOUNTER — Ambulatory Visit (INDEPENDENT_AMBULATORY_CARE_PROVIDER_SITE_OTHER): Payer: Medicare Other

## 2019-02-27 DIAGNOSIS — Z95810 Presence of automatic (implantable) cardiac defibrillator: Secondary | ICD-10-CM | POA: Diagnosis not present

## 2019-02-27 DIAGNOSIS — I5022 Chronic systolic (congestive) heart failure: Secondary | ICD-10-CM

## 2019-03-01 NOTE — Progress Notes (Signed)
EPIC Encounter for ICM Monitoring  Patient Name: Omar Williams is a 84 y.o. male Date: 03/01/2019 Primary Care Physican: Wilson Singer, MD Primary Cardiologist: Purvis Sheffield Electrophysiologist:Allred Nephrologist:[facility] Bi-V Pacing:99.9% 1/27/2021Weight:180.5lbs  Spoke with patient and he is feeling well at this time.  He is waiting for the county to provide opportunity to get the COVID vaccine.    OptivolThoracic impedancenormal.  Prescribed:No diuretic  Recommendations: No changes and encouraged to call if experiencing any fluid symptoms.    Follow-up plan: ICM clinic phone appointment on 04/13/2019.   91 day device clinic remote transmission 04/12/2019.  Office appt 04/07/2019 with Dr. Johney Frame.    Copy of ICM check sent to Dr. Johney Frame.   3 month ICM trend: 02/27/2019    1 Year ICM trend:       Karie Soda, RN 03/01/2019 9:39 AM

## 2019-03-12 ENCOUNTER — Other Ambulatory Visit (INDEPENDENT_AMBULATORY_CARE_PROVIDER_SITE_OTHER): Payer: Self-pay | Admitting: Internal Medicine

## 2019-03-16 ENCOUNTER — Other Ambulatory Visit (INDEPENDENT_AMBULATORY_CARE_PROVIDER_SITE_OTHER): Payer: Self-pay | Admitting: Internal Medicine

## 2019-03-16 ENCOUNTER — Telehealth (INDEPENDENT_AMBULATORY_CARE_PROVIDER_SITE_OTHER): Payer: Self-pay | Admitting: Internal Medicine

## 2019-03-16 MED ORDER — LOSARTAN POTASSIUM 100 MG PO TABS: 50.0000 mg | ORAL_TABLET | Freq: Two times a day (BID) | ORAL | 0 refills | Status: DC

## 2019-03-17 DIAGNOSIS — E559 Vitamin D deficiency, unspecified: Secondary | ICD-10-CM | POA: Diagnosis not present

## 2019-03-17 DIAGNOSIS — Z79899 Other long term (current) drug therapy: Secondary | ICD-10-CM | POA: Diagnosis not present

## 2019-03-17 DIAGNOSIS — N1831 Chronic kidney disease, stage 3a: Secondary | ICD-10-CM | POA: Diagnosis not present

## 2019-03-17 DIAGNOSIS — R809 Proteinuria, unspecified: Secondary | ICD-10-CM | POA: Diagnosis not present

## 2019-03-17 DIAGNOSIS — D631 Anemia in chronic kidney disease: Secondary | ICD-10-CM | POA: Diagnosis not present

## 2019-03-20 NOTE — Telephone Encounter (Signed)
Done

## 2019-03-22 DIAGNOSIS — R809 Proteinuria, unspecified: Secondary | ICD-10-CM | POA: Diagnosis not present

## 2019-03-22 DIAGNOSIS — E1129 Type 2 diabetes mellitus with other diabetic kidney complication: Secondary | ICD-10-CM | POA: Diagnosis not present

## 2019-03-22 DIAGNOSIS — Z79899 Other long term (current) drug therapy: Secondary | ICD-10-CM | POA: Diagnosis not present

## 2019-03-22 DIAGNOSIS — N189 Chronic kidney disease, unspecified: Secondary | ICD-10-CM | POA: Diagnosis not present

## 2019-03-22 DIAGNOSIS — E1122 Type 2 diabetes mellitus with diabetic chronic kidney disease: Secondary | ICD-10-CM | POA: Diagnosis not present

## 2019-03-22 DIAGNOSIS — I129 Hypertensive chronic kidney disease with stage 1 through stage 4 chronic kidney disease, or unspecified chronic kidney disease: Secondary | ICD-10-CM | POA: Diagnosis not present

## 2019-03-22 DIAGNOSIS — E559 Vitamin D deficiency, unspecified: Secondary | ICD-10-CM | POA: Diagnosis not present

## 2019-04-04 ENCOUNTER — Telehealth: Payer: Self-pay | Admitting: Internal Medicine

## 2019-04-04 NOTE — Telephone Encounter (Signed)
Virtual Visit Pre-Appointment Phone Call  "(Name), I am calling you today to discuss your upcoming appointment. We are currently trying to limit exposure to the virus that causes COVID-19 by seeing patients at home rather than in the office."  1. "What is the BEST phone number to call the day of the visit?" -   [phone] 2.   3. "Do you have or have access to (through a family member/friend) a smartphone with video capability that we can use for your visit?" a. If yes - list this number in appt notes as "cell" (if different from BEST phone #) and list the appointment type as a VIDEO visit in appointment notes b. If no - list the appointment type as a PHONE visit in appointment notes  4. Confirm consent - "In the setting of the current Covid19 crisis, you are scheduled for a (phone or video) visit with your provider on (date) at (time).  Just as we do with many in-office visits, in order for you to participate in this visit, we must obtain consent.  If you'd like, I can send this to your mychart (if signed up) or email for you to review.  Otherwise, I can obtain your verbal consent now.  All virtual visits are billed to your insurance company just like a normal visit would be.  By agreeing to a virtual visit, we'd like you to understand that the technology does not allow for your provider to perform an examination, and thus may limit your provider's ability to fully assess your condition. If your provider identifies any concerns that need to be evaluated in person, we will make arrangements to do so.  Finally, though the technology is pretty good, we cannot assure that it will always work on either your or our end, and in the setting of a video visit, we may have to convert it to a phone-only visit.  In either situation, we cannot ensure that we have a secure connection.  Are you willing to proceed?" STAFF: Did the patient verbally acknowledge consent to telehealth visit? Document YES/NO here:  YES  5. Advise patient to be prepared - "Two hours prior to your appointment, go ahead and check your blood pressure, pulse, oxygen saturation, and your weight (if you have the equipment to check those) and write them all down. When your visit starts, your provider will ask you for this information. If you have an Apple Watch or Kardia device, please plan to have heart rate information ready on the day of your appointment. Please have a pen and paper handy nearby the day of the visit as well."  6. Give patient instructions for MyChart download to smartphone OR Doximity/Doxy.me as below if video visit (depending on what platform provider is using)  7. Inform patient they will receive a phone call 15 minutes prior to their appointment time (may be from unknown caller ID) so they should be prepared to answer    TELEPHONE CALL NOTE  Omar Williams has been deemed a candidate for a follow-up tele-health visit to limit community exposure during the Covid-19 pandemic. I spoke with the patient via phone to ensure availability of phone/video source, confirm preferred email & phone number, and discuss instructions and expectations.  I reminded Omar Williams to be prepared with any vital sign and/or heart rhythm information that could potentially be obtained via home monitoring, at the time of his visit. I reminded Omar Williams to expect a phone call prior to his visit.  Megan Salon 04/04/2019 9:16 AM   INSTRUCTIONS FOR DOWNLOADING THE MYCHART APP TO SMARTPHONE  - The patient must first make sure to have activated MyChart and know their login information - If Apple, go to [employer] and type in MyChart in the search bar and download the app. If Android, ask patient to go to [employer] and type in [place] in the search bar and download the app. The app is free but as with any other app downloads, their phone may require them to verify saved payment information or Apple/Android password.  - The patient  will need to then log into the app with their MyChart username and password, and select  as their healthcare provider to link the account. When it is time for your visit, go to the MyChart app, find appointments, and click Begin Video Visit. Be sure to Select Allow for your device to access the Microphone and Camera for your visit. You will then be connected, and your provider will be with you shortly.  **If they have any issues connecting, or need assistance please contact MyChart service desk [phone])**  **If using a computer, in order to ensure the best quality for their visit they will need to use either of the following Internet Browsers: [employer] or Google Chrome**  IF USING DOXIMITY or DOXY.ME - The patient will receive a link just prior to their visit by text.     FULL LENGTH CONSENT FOR TELE-HEALTH VISIT   I hereby voluntarily request, consent and authorize [facility] and its employed or contracted physicians, physician assistants, nurse practitioners or other licensed health care professionals (the Practitioner), to provide me with telemedicine health care services (the "Services") as deemed necessary by the treating Practitioner. I acknowledge and consent to receive the Services by the Practitioner via telemedicine. I understand that the telemedicine visit will involve communicating with the Practitioner through live audiovisual communication technology and the disclosure of certain medical information by electronic transmission. I acknowledge that I have been given the opportunity to request an in-person assessment or other available alternative prior to the telemedicine visit and am voluntarily participating in the telemedicine visit.  I understand that I have the right to withhold or withdraw my consent to the use of telemedicine in the course of my care at any time, without affecting my right to future care or treatment, and that the Practitioner  or I may terminate the telemedicine visit at any time. I understand that I have the right to inspect all information obtained and/or recorded in the course of the telemedicine visit and may receive copies of available information for a reasonable fee.  I understand that some of the potential risks of receiving the Services via telemedicine include:  Marland Kitchen Delay or interruption in medical evaluation due to technological equipment failure or disruption; . Information transmitted may not be sufficient (e.g. poor resolution of images) to allow for appropriate medical decision making by the Practitioner; and/or  . In rare instances, security protocols could fail, causing a breach of personal health information.  Furthermore, I acknowledge that it is my responsibility to provide information about my medical history, conditions and care that is complete and accurate to the best of my ability. I acknowledge that Practitioner's advice, recommendations, and/or decision may be based on factors not within their control, such as incomplete or inaccurate data provided by me or distortions of diagnostic images or specimens that may result from electronic transmissions. I understand that the  practice of medicine is not an Chief Strategy Officer and that Practitioner makes no warranties or guarantees regarding treatment outcomes. I acknowledge that I will receive a copy of this consent concurrently upon execution via email to the email address I last provided but may also request a printed copy by calling the office of [facility].    I understand that my insurance will be billed for this visit.   I have read or had this consent read to me. . I understand the contents of this consent, which adequately explains the benefits and risks of the Services being provided via telemedicine.  . I have been provided ample opportunity to ask questions regarding this consent and the Services and have had my questions answered to my  satisfaction. . I give my informed consent for the services to be provided through the use of telemedicine in my medical care  By participating in this telemedicine visit I agree to the above.

## 2019-04-07 ENCOUNTER — Encounter: Payer: Self-pay | Admitting: Internal Medicine

## 2019-04-07 ENCOUNTER — Telehealth (INDEPENDENT_AMBULATORY_CARE_PROVIDER_SITE_OTHER): Payer: Medicare Other | Admitting: Internal Medicine

## 2019-04-07 VITALS — BP 117/62 | HR 60 | Ht 67.0 in | Wt 181.0 lb

## 2019-04-07 DIAGNOSIS — I4901 Ventricular fibrillation: Secondary | ICD-10-CM | POA: Diagnosis not present

## 2019-04-07 DIAGNOSIS — I25118 Atherosclerotic heart disease of native coronary artery with other forms of angina pectoris: Secondary | ICD-10-CM | POA: Diagnosis not present

## 2019-04-07 DIAGNOSIS — E785 Hyperlipidemia, unspecified: Secondary | ICD-10-CM | POA: Diagnosis not present

## 2019-04-07 DIAGNOSIS — I5022 Chronic systolic (congestive) heart failure: Secondary | ICD-10-CM

## 2019-04-07 NOTE — Progress Notes (Signed)
Electrophysiology TeleHealth Note  Due to national recommendations of social distancing due to [place] 19, an audio telehealth visit is felt to be most appropriate for this patient at this time.  Verbal consent was obtained by me for the telehealth visit today.  The patient does not have capability for a virtual visit.  A phone visit is therefore required today.   Date:  04/07/2019   ID:  Omar Williams, DOB 1935/12/21, MRN [id number]  Location: patient's home  Provider location:  [place]  Evaluation Performed: Follow-up visit  PCP:  Doree Albee, MD   Electrophysiologist:  Dr Rayann Heman  Chief Complaint:  palpitations  History of Present Illness:    Omar Williams is a 84 y.o. male who presents via telehealth conferencing today.  Since last being seen in our clinic, the patient reports doing very well.  Today, he denies symptoms of palpitations, chest pain, shortness of breath,  lower extremity edema, dizziness, presyncope, or syncope.  The patient is otherwise without complaint today.  The patient denies symptoms of fevers, chills, cough, or new SOB worrisome for COVID 19.  Past Medical History:  Diagnosis Date  . Acid reflux   . Blood transfusion without reported diagnosis   . Cardiac arrest ([place])    a. occuring in 12/2016. LAD disease but no targets for CABG or PCI. Underwent ICD placement as EF 15-20%.   . CHF (congestive heart failure) ([place])   . Coronary artery disease   . Diabetes mellitus without complication ([place])   . Hyperlipidemia   . Hypertension   . Myocardial infarction ([place])   . Renal disorder     Past Surgical History:  Procedure Laterality Date  . BIV ICD INSERTION CRT-D N/A 01/01/2017   Procedure: BIV ICD INSERTION CRT-D;  Surgeon: Constance Haw, MD;  Location: [facility];  Service: Cardiovascular;  Laterality: N/A;    Current Outpatient Medications  Medication Sig Dispense Refill  . ascorbic acid (VITAMIN C) 500 MG tablet Take 500 mg by  mouth 2 (two) times daily.    Marland Kitchen aspirin EC 81 MG tablet Take 81 mg by mouth daily.    Marland Kitchen atorvastatin (LIPITOR) 80 MG tablet Take 1 tablet (80 mg total) by mouth at bedtime. 90 tablet 1  . Bee Pollen 550 MG CAPS Take 1 capsule by mouth 2 (two) times daily.     . blood glucose meter kit and supplies KIT Test daily.  Dx e11.22 1 each 0  . carvedilol (COREG) 6.25 MG tablet Take 1 tablet (6.25 mg total) by mouth 2 (two) times daily. 180 tablet 0  . Cholecalciferol (VITAMIN D3) 125 MCG (5000 UT) TABS Take 1 tablet by mouth daily.     . CHROMIUM-CINNAMON PO Take 2,000 mg by mouth in the morning and at bedtime.    Marland Kitchen ezetimibe (ZETIA) 10 MG tablet TAKE 1 TABLET BY MOUTH EVERY DAY 90 tablet 1  . FIBER ADULT GUMMIES PO Take 2 tablets by mouth at bedtime.    . finasteride (PROSCAR) 5 MG tablet Take 1 tablet (5 mg total) by mouth at bedtime. 90 tablet 3  . glimepiride (AMARYL) 4 MG tablet TAKE 1 TABLET (4 MG TOTAL) BY MOUTH 2 (TWO) TIMES DAILY. 180 tablet 0  . glucose blood (COOL BLOOD GLUCOSE TEST STRIPS) test strip Use as instructed 100 each 2  . losartan (COZAAR) 100 MG tablet Take 0.5 tablets (50 mg total) by mouth 2 (two) times daily. 90 tablet 0  .  nitroGLYCERIN (NITROSTAT) 0.4 MG SL tablet Place 1 tablet (0.4 mg total) under the tongue every 5 (five) minutes x 3 doses as needed for chest pain. 25 tablet 3  . Omega-3 Fatty Acids (FISH OIL) 1200 MG CAPS Take 1 capsule by mouth 2 (two) times daily.     Marland Kitchen oxybutynin (DITROPAN) 5 MG tablet Take 2.5 mg by mouth 3 (three) times daily.    . tamsulosin (FLOMAX) 0.4 MG CAPS capsule Take 0.4 mg by mouth every morning.    . vitamin B-12 (CYANOCOBALAMIN) 1000 MCG tablet Take 1,000 mcg by mouth every morning.      No current facility-administered medications for this visit.    Allergies:   Patient has no known allergies.   Social History:  The patient  reports that he has never smoked. He has never used smokeless tobacco. He reports that he does not drink  alcohol or use drugs.   Family History:  The patient's family history includes CAD in his brother, brother, and sister; Diabetes in his brother, brother, daughter, and sister; Fibromyalgia in his daughter; Heart disease in his daughter; Hyperlipidemia in his brother, brother, daughter, mother, and sister; Hypertension in his brother, brother, daughter, mother, and sister; Multiple sclerosis in his daughter; Thyroid disease in his daughter and daughter.   ROS:  Please see the history of present illness.   All other systems are personally reviewed and negative.    Exam:    Vital Signs:  BP 117/62   Pulse 60   Ht '5\' 7"'  (1.702 m)   Wt 181 lb (82.1 kg)   BMI 28.35 kg/m   Well sounding, alert and conversant   Labs/Other Tests and Data Reviewed:    Recent Labs: 01/09/2019: ALT 26; BUN 23; Creat 1.40; Potassium 4.7; Sodium 140   Wt Readings from Last 3 Encounters:  04/07/19 181 lb (82.1 kg)  01/09/19 181 lb (82.1 kg)  12/14/18 180 lb (81.6 kg)    EF 40-45%  Last device remote is reviewed from [facility] which reveals normal device function, no arrhythmias    ASSESSMENT & PLAN:    1.  Chronic systolic dysfunction/ ischemic CM/ CAD No ischemic symptoms No CHF symptoms Remotes are up to date Normal BIV ICD function Followed in ICM device clinic  2. S/p VF arrest 11/18 No further episodes Given life threatening arrhythmia history, he needs very close follow-up with the EP team going forward  3. HL Continue Lipitor and zetia  Follow-up:  with me in a year   Patient Risk:  after full review of this patients clinical status, I feel that they are at moderate risk at this time.  Today, I have spent 15 minutes with the patient with telehealth technology discussing arrhythmia management .    Army Fossa, MD  04/07/2019 10:19 AM     [facility] [address] [place] [address] [phone] (office) [phone] (fax)

## 2019-04-12 ENCOUNTER — Ambulatory Visit (INDEPENDENT_AMBULATORY_CARE_PROVIDER_SITE_OTHER): Payer: Medicare Other | Admitting: *Deleted

## 2019-04-12 DIAGNOSIS — I255 Ischemic cardiomyopathy: Secondary | ICD-10-CM

## 2019-04-12 LAB — CUP PACEART REMOTE DEVICE CHECK
Battery Remaining Longevity: 75 mo
Battery Voltage: 2.99 V
Brady Statistic AP VP Percent: 64.21 %
Brady Statistic AP VS Percent: 0.02 %
Brady Statistic AS VP Percent: 35.61 %
Brady Statistic AS VS Percent: 0.15 %
Brady Statistic RA Percent Paced: 64.23 %
Brady Statistic RV Percent Paced: 99.16 %
Date Time Interrogation Session: 20210310022822
HighPow Impedance: 82 Ohm
Implantable Lead Implant Date: 20181130
Implantable Lead Implant Date: 20181130
Implantable Lead Implant Date: 20181130
Implantable Lead Location: 753858
Implantable Lead Location: 753859
Implantable Lead Location: 753860
Implantable Lead Model: 4598
Implantable Lead Model: 5076
Implantable Lead Model: 6935
Implantable Pulse Generator Implant Date: 20181130
Lead Channel Impedance Value: 152 Ohm
Lead Channel Impedance Value: 156.606
Lead Channel Impedance Value: 156.606
Lead Channel Impedance Value: 156.606
Lead Channel Impedance Value: 161.5 Ohm
Lead Channel Impedance Value: 304 Ohm
Lead Channel Impedance Value: 304 Ohm
Lead Channel Impedance Value: 323 Ohm
Lead Channel Impedance Value: 323 Ohm
Lead Channel Impedance Value: 380 Ohm
Lead Channel Impedance Value: 437 Ohm
Lead Channel Impedance Value: 494 Ohm
Lead Channel Impedance Value: 494 Ohm
Lead Channel Impedance Value: 513 Ohm
Lead Channel Impedance Value: 532 Ohm
Lead Channel Impedance Value: 532 Ohm
Lead Channel Impedance Value: 646 Ohm
Lead Channel Impedance Value: 703 Ohm
Lead Channel Pacing Threshold Amplitude: 0.375 V
Lead Channel Pacing Threshold Amplitude: 0.75 V
Lead Channel Pacing Threshold Amplitude: 0.875 V
Lead Channel Pacing Threshold Pulse Width: 0.4 ms
Lead Channel Pacing Threshold Pulse Width: 0.4 ms
Lead Channel Pacing Threshold Pulse Width: 0.4 ms
Lead Channel Sensing Intrinsic Amplitude: 11.25 mV
Lead Channel Sensing Intrinsic Amplitude: 11.25 mV
Lead Channel Sensing Intrinsic Amplitude: 2.625 mV
Lead Channel Sensing Intrinsic Amplitude: 2.625 mV
Lead Channel Setting Pacing Amplitude: 1.25 V
Lead Channel Setting Pacing Amplitude: 2 V
Lead Channel Setting Pacing Amplitude: 2.5 V
Lead Channel Setting Pacing Pulse Width: 0.4 ms
Lead Channel Setting Pacing Pulse Width: 0.4 ms
Lead Channel Setting Sensing Sensitivity: 0.3 mV

## 2019-04-12 NOTE — Progress Notes (Signed)
ICD Remote  

## 2019-04-17 ENCOUNTER — Ambulatory Visit (INDEPENDENT_AMBULATORY_CARE_PROVIDER_SITE_OTHER): Payer: Medicare Other | Admitting: Nurse Practitioner

## 2019-04-17 ENCOUNTER — Other Ambulatory Visit: Payer: Self-pay

## 2019-04-17 ENCOUNTER — Ambulatory Visit (INDEPENDENT_AMBULATORY_CARE_PROVIDER_SITE_OTHER): Payer: Medicare Other

## 2019-04-17 ENCOUNTER — Encounter (INDEPENDENT_AMBULATORY_CARE_PROVIDER_SITE_OTHER): Payer: Self-pay | Admitting: Nurse Practitioner

## 2019-04-17 VITALS — BP 115/62 | HR 61 | Temp 97.5°F | Ht 67.0 in | Wt 183.2 lb

## 2019-04-17 DIAGNOSIS — N183 Chronic kidney disease, stage 3 unspecified: Secondary | ICD-10-CM | POA: Diagnosis not present

## 2019-04-17 DIAGNOSIS — E1122 Type 2 diabetes mellitus with diabetic chronic kidney disease: Secondary | ICD-10-CM | POA: Diagnosis not present

## 2019-04-17 DIAGNOSIS — I5022 Chronic systolic (congestive) heart failure: Secondary | ICD-10-CM

## 2019-04-17 DIAGNOSIS — Z95810 Presence of automatic (implantable) cardiac defibrillator: Secondary | ICD-10-CM

## 2019-04-17 DIAGNOSIS — E559 Vitamin D deficiency, unspecified: Secondary | ICD-10-CM | POA: Diagnosis not present

## 2019-04-17 DIAGNOSIS — I10 Essential (primary) hypertension: Secondary | ICD-10-CM | POA: Diagnosis not present

## 2019-04-17 DIAGNOSIS — E785 Hyperlipidemia, unspecified: Secondary | ICD-10-CM | POA: Diagnosis not present

## 2019-04-17 NOTE — Progress Notes (Signed)
Subjective:  Patient ID: Omar Williams, male    DOB: 1935-03-02  Age: 84 y.o. MRN: [id number]  CC:  Chief Complaint  Patient presents with  . Diabetes  . Hypertension  . Follow-up    Vitamin D deficiency, hyperlipidemia      HPI  This patient comes in today for the above.  Hypertension: He has a history of hypertension and continues on Coreg and losartan.  Type 2 diabetes: He continues on glimepiride twice a day.  Last A1c was 7.7 in December 2020.  Based on his age his goal is approximately less than 8.0.  He tells me he has seen an eye doctor within the last year.  He has a family member that helps maintain his toenails, and he regularly checks his feet as well as make sure to dry between his toes after showering.  Vitamin D deficiency: He continues on 5000 IUs of vitamin D3 daily.  Last serum level was 75 in December 2020  Hyperlipidemia: He continues on Lipitor and Zetia daily.  Last lipid panel was collected in June 2020 and showed LDL of 94.   Past Medical History:  Diagnosis Date  . Acid reflux   . Blood transfusion without reported diagnosis   . Cardiac arrest ([place])    a. occuring in 12/2016. LAD disease but no targets for CABG or PCI. Underwent ICD placement as EF 15-20%.   . CHF (congestive heart failure) ([place])   . Coronary artery disease   . Diabetes mellitus without complication ([place])   . Hyperlipidemia   . Hypertension   . Myocardial infarction ([place])   . Renal disorder       Family History  Problem Relation Age of Onset  . Hypertension Mother   . Hyperlipidemia Mother   . CAD Sister   . Diabetes Sister   . Hyperlipidemia Sister   . Hypertension Sister   . CAD Brother   . Diabetes Brother   . Hypertension Brother   . Hyperlipidemia Brother   . CAD Brother   . Diabetes Brother   . Hypertension Brother   . Hyperlipidemia Brother   . Thyroid disease Daughter   . Hypertension Daughter   . Diabetes Daughter   . Hyperlipidemia Daughter   .  Fibromyalgia Daughter   . Heart disease Daughter        stents  . Multiple sclerosis Daughter   . Thyroid disease Daughter     Social History   Social History Narrative   Lives with with wife [name]   Married 5 years   Daughter [name] stays frequently   Granddaughter [name] near by   Social History   Tobacco Use  . Smoking status: Never Smoker  . Smokeless tobacco: Never Used  Substance Use Topics  . Alcohol use: No     Current Meds  Medication Sig  . ascorbic acid (VITAMIN C) 500 MG tablet Take 500 mg by mouth 2 (two) times daily.  Marland Kitchen aspirin EC 81 MG tablet Take 81 mg by mouth daily.  Marland Kitchen atorvastatin (LIPITOR) 80 MG tablet Take 1 tablet (80 mg total) by mouth at bedtime.  Raelyn Ensign Pollen 550 MG CAPS Take 1 capsule by mouth 2 (two) times daily.   . blood glucose meter kit and supplies KIT Test daily.  Dx e11.22  . carvedilol (COREG) 6.25 MG tablet Take 1 tablet (6.25 mg total) by mouth 2 (two) times daily.  . Cholecalciferol (VITAMIN D3) 125 MCG (5000 UT) TABS  Take 1 tablet by mouth daily.   . CHROMIUM-CINNAMON PO Take 2,000 mg by mouth in the morning and at bedtime.  Marland Kitchen ezetimibe (ZETIA) 10 MG tablet TAKE 1 TABLET BY MOUTH EVERY DAY  . FIBER ADULT GUMMIES PO Take 2 tablets by mouth at bedtime.  . finasteride (PROSCAR) 5 MG tablet Take 1 tablet (5 mg total) by mouth at bedtime.  Marland Kitchen glimepiride (AMARYL) 4 MG tablet TAKE 1 TABLET (4 MG TOTAL) BY MOUTH 2 (TWO) TIMES DAILY.  Marland Kitchen glucose blood (COOL BLOOD GLUCOSE TEST STRIPS) test strip Use as instructed  . losartan (COZAAR) 100 MG tablet Take 0.5 tablets (50 mg total) by mouth 2 (two) times daily.  . nitroGLYCERIN (NITROSTAT) 0.4 MG SL tablet Place 1 tablet (0.4 mg total) under the tongue every 5 (five) minutes x 3 doses as needed for chest pain.  . Omega-3 Fatty Acids (FISH OIL) 1200 MG CAPS Take 1 capsule by mouth 2 (two) times daily.   Marland Kitchen oxybutynin (DITROPAN) 5 MG tablet Take 2.5 mg by mouth 3 (three) times daily.  . tamsulosin  (FLOMAX) 0.4 MG CAPS capsule Take 0.4 mg by mouth every morning.  . vitamin B-12 (CYANOCOBALAMIN) 1000 MCG tablet Take 1,000 mcg by mouth every morning.     ROS:  Review of Systems  Constitutional: Negative.   HENT: Negative.   Eyes: Negative.   Respiratory: Negative.   Cardiovascular: Negative.   Gastrointestinal: Positive for heartburn (relieved with as needed medication).  Skin: Negative.   Neurological: Negative.      Objective:   Today's Vitals: BP 115/62 (BP Location: Right Arm, Patient Position: Sitting, Cuff Size: Normal)   Pulse 61   Temp (!) 97.5 F (36.4 C) (Oral)   Ht '5\' 7"'  (1.702 m)   Wt 183 lb 3.2 oz (83.1 kg)   SpO2 95%   BMI 28.69 kg/m  Vitals with BMI 04/17/2019 04/07/2019 01/09/2019  Height '5\' 7"'  '5\' 7"'  '5\' 9"'   Weight 183 lbs 3 oz 181 lbs 181 lbs  BMI 28.69 49.82 64.15  Systolic 830 940 768  Diastolic 62 62 60  Pulse 61 60 64     Physical Exam Vitals reviewed.  Constitutional:      Appearance: Normal appearance.  HENT:     Head: Normocephalic and atraumatic.  Cardiovascular:     Rate and Rhythm: Normal rate and regular rhythm.     Pulses:          Dorsalis pedis pulses are 2+ on the right side and 2+ on the left side.  Pulmonary:     Effort: Pulmonary effort is normal.     Breath sounds: Normal breath sounds.  Musculoskeletal:     Cervical back: Neck supple.  Feet:     Right foot:     Protective Sensation: 10 sites tested. 10 sites sensed.     Skin integrity: Skin integrity normal.     Toenail Condition: Right toenails are long.     Left foot:     Protective Sensation: 10 sites tested. 9 sites sensed.     Skin integrity: Skin integrity normal.     Toenail Condition: Left toenails are long.  Skin:    General: Skin is warm and dry.  Neurological:     Mental Status: He is alert and oriented to person, place, and time.  Psychiatric:        Mood and Affect: Mood normal.        Behavior: Behavior normal.  Thought Content: Thought content  normal.        Judgment: Judgment normal.          Assessment   1. Essential hypertension   2. Type 2 diabetes mellitus with stage 3 chronic kidney disease, without long-term current use of insulin, unspecified whether stage 3a or 3b CKD ([place])   3. Vitamin D deficiency   4. Hyperlipidemia LDL goal <70      Tests ordered Orders Placed This Encounter  Procedures  . CMP with eGFR([facility])  . Microalbumin/Creatinine Ratio, Urine  . Hemoglobin A1c  . Vitamin D, 25-hydroxy     Plan: 1.  Blood pressure controlled on current regimen.  He will continue his current medications prescribed.  2.  We will collect A1c for further evaluation today.  We will also screen urine for any albuminuria.  He will continue on current medication regimen for now.  3.  We will collect serum level of vitamin D today for further evaluation.  For now he will continue on his current supplement.  4.  He will continue on his current medication regimen at this time.  No orders of the defined types were placed in this encounter.   Patient to follow-up in 6 weeks for his annual wellness visit and then again in 3 months for office visit.  Ailene Ards, NP

## 2019-04-18 LAB — COMPLETE METABOLIC PANEL WITH GFR
AG Ratio: 1.5 (calc) (ref 1.0–2.5)
ALT: 22 U/L (ref 9–46)
AST: 19 U/L (ref 10–35)
Albumin: 4.3 g/dL (ref 3.6–5.1)
Alkaline phosphatase (APISO): 55 U/L (ref 35–144)
BUN/Creatinine Ratio: 14 (calc) (ref 6–22)
BUN: 20 mg/dL (ref 7–25)
CO2: 26 mmol/L (ref 20–32)
Calcium: 9.6 mg/dL (ref 8.6–10.3)
Chloride: 108 mmol/L (ref 98–110)
Creat: 1.4 mg/dL — ABNORMAL HIGH (ref 0.70–1.11)
GFR, Est African American: 54 mL/min/{1.73_m2} — ABNORMAL LOW (ref >=60)
GFR, Est Non African American: 47 mL/min/{1.73_m2} — ABNORMAL LOW (ref >=60)
Globulin: 2.8 g/dL (calc) (ref 1.9–3.7)
Glucose, Bld: 150 mg/dL — ABNORMAL HIGH (ref 65–99)
Potassium: 5.1 mmol/L (ref 3.5–5.3)
Sodium: 142 mmol/L (ref 135–146)
Total Bilirubin: 1.2 mg/dL (ref 0.2–1.2)
Total Protein: 7.1 g/dL (ref 6.1–8.1)

## 2019-04-18 LAB — HEMOGLOBIN A1C
Hgb A1c MFr Bld: 7.8 % of total Hgb — ABNORMAL HIGH (ref <5.7)
Mean Plasma Glucose: 177 (calc)
eAG (mmol/L): 9.8 (calc)

## 2019-04-18 LAB — MICROALBUMIN / CREATININE URINE RATIO
Creatinine, Urine: 110 mg/dL (ref 20–320)
Microalb Creat Ratio: 162 mcg/mg creat — ABNORMAL HIGH (ref <30)
Microalb, Ur: 17.8 mg/dL

## 2019-04-18 LAB — VITAMIN D 25 HYDROXY (VIT D DEFICIENCY, FRACTURES): Vit D, 25-Hydroxy: 71 ng/mL (ref 30–100)

## 2019-04-18 NOTE — Progress Notes (Addendum)
EPIC Encounter for ICM Monitoring  Patient Name: Omar Williams is a 84 y.o. male Date: 04/18/2019 Primary Care Physican: Wilson Singer, MD Electrophysiologist:Allred Nephrologist:[facility] Bi-V Pacing:99.9% 3/19/2021Weight:182.2lbs  Spoke with patient and he is doing well.   He is waiting to get an appointment for COVID Vaccine  OptivolThoracic impedancetrending slightly below baseline normal.  Prescribed:No diuretic  Recommendations:No changes and encouraged to call if experiencing any fluid symptoms.    Follow-up plan: ICM clinic phone appointment on4/19/2021. 91 day device clinic remote transmission 07/12/2019.   Copy of ICM check sent to Dr.Allred.  3 month ICM trend: 04/17/2019    1 Year ICM trend:       Karie Soda, RN 04/18/2019 4:46 PM

## 2019-04-24 ENCOUNTER — Other Ambulatory Visit (INDEPENDENT_AMBULATORY_CARE_PROVIDER_SITE_OTHER): Payer: Self-pay | Admitting: Internal Medicine

## 2019-05-03 ENCOUNTER — Other Ambulatory Visit (INDEPENDENT_AMBULATORY_CARE_PROVIDER_SITE_OTHER): Payer: Self-pay | Admitting: Internal Medicine

## 2019-05-04 ENCOUNTER — Other Ambulatory Visit (INDEPENDENT_AMBULATORY_CARE_PROVIDER_SITE_OTHER): Payer: Self-pay

## 2019-05-04 ENCOUNTER — Telehealth (INDEPENDENT_AMBULATORY_CARE_PROVIDER_SITE_OTHER): Payer: Self-pay

## 2019-05-04 DIAGNOSIS — E119 Type 2 diabetes mellitus without complications: Secondary | ICD-10-CM

## 2019-05-04 MED ORDER — COOL BLOOD GLUCOSE TEST STRIPS VI STRP: ORAL_STRIP | 2 refills | Status: DC

## 2019-05-04 NOTE — Telephone Encounter (Signed)
Close chart

## 2019-05-08 ENCOUNTER — Telehealth (INDEPENDENT_AMBULATORY_CARE_PROVIDER_SITE_OTHER): Payer: Self-pay

## 2019-05-08 DIAGNOSIS — E119 Type 2 diabetes mellitus without complications: Secondary | ICD-10-CM

## 2019-05-08 MED ORDER — COOL BLOOD GLUCOSE TEST STRIPS VI STRP: ORAL_STRIP | 2 refills | Status: DC

## 2019-05-08 NOTE — Telephone Encounter (Signed)
LeighAnn Trew Sunde, CMA  

## 2019-05-22 ENCOUNTER — Ambulatory Visit (INDEPENDENT_AMBULATORY_CARE_PROVIDER_SITE_OTHER): Payer: Medicare Other

## 2019-05-22 ENCOUNTER — Other Ambulatory Visit (INDEPENDENT_AMBULATORY_CARE_PROVIDER_SITE_OTHER): Payer: Self-pay | Admitting: Internal Medicine

## 2019-05-22 DIAGNOSIS — I5022 Chronic systolic (congestive) heart failure: Secondary | ICD-10-CM

## 2019-05-22 DIAGNOSIS — Z95810 Presence of automatic (implantable) cardiac defibrillator: Secondary | ICD-10-CM | POA: Diagnosis not present

## 2019-05-23 NOTE — Progress Notes (Signed)
EPIC Encounter for ICM Monitoring  Patient Name: Omar Williams is a 84 y.o. male Date: 05/23/2019 Primary Care Physican: Wilson Singer, MD Primary Cardiologist: Purvis Sheffield Electrophysiologist:Allred Nephrologist:[facility] Bi-V Pacing:99.8% 3/19/2021Weight:182.2lbs  Spoke with patient and reports feeling well at this time.  Denies fluid symptoms.    OptivolThoracic impedancesuggesting normal.  Prescribed:No diuretic  Recommendations: No changes and encouraged to call if experiencing any fluid symptoms.  Follow-up plan: ICM clinic phone appointment on5/24/2021. 91 day device clinic remote transmission 07/12/2019. Office visit with Dr Purvis Sheffield 06/19/2019.  Copy of ICM check sent to Dr.Allred.  3 month ICM trend: 05/22/2019    1 Year ICM trend:       Karie Soda, RN 05/23/2019 10:12 AM

## 2019-06-02 ENCOUNTER — Encounter (INDEPENDENT_AMBULATORY_CARE_PROVIDER_SITE_OTHER): Payer: Self-pay | Admitting: Nurse Practitioner

## 2019-06-02 ENCOUNTER — Other Ambulatory Visit: Payer: Self-pay

## 2019-06-02 ENCOUNTER — Telehealth (INDEPENDENT_AMBULATORY_CARE_PROVIDER_SITE_OTHER): Payer: Self-pay | Admitting: Nurse Practitioner

## 2019-06-02 ENCOUNTER — Ambulatory Visit (INDEPENDENT_AMBULATORY_CARE_PROVIDER_SITE_OTHER): Payer: Medicare Other | Admitting: Nurse Practitioner

## 2019-06-02 VITALS — BP 112/66 | HR 65 | Ht 67.0 in | Wt 178.0 lb

## 2019-06-02 DIAGNOSIS — Z0000 Encounter for general adult medical examination without abnormal findings: Secondary | ICD-10-CM | POA: Diagnosis not present

## 2019-06-02 NOTE — Telephone Encounter (Signed)
Benedetto Goad, please print out the after visit summary for this patient from office visit on 06/02/2019 and mailed to his home.  Thank you.

## 2019-06-02 NOTE — Patient Instructions (Signed)
  Omar Williams , Thank you for taking time to come for your Medicare Wellness Visit. I appreciate your ongoing commitment to your health goals. Please review the following plan we discussed and let me know if I can assist you in the future.   These are the goals we discussed: Goals    . HEMOGLOBIN A1C < 7.0    . LDL CALC < 70       This is a list of the screening recommended for you and due dates:  Health Maintenance  Topic Date Due  . Eye exam for diabetics  08/02/2017  . Complete foot exam   02/23/2018  . COVID-19 Vaccine (1) 06/18/2019*  . Flu Shot  09/03/2019  . Hemoglobin A1C  10/18/2019  . Tetanus Vaccine  08/27/2027  . Pneumonia vaccines  Completed  *Topic was postponed. The date shown is not the original due date.

## 2019-06-02 NOTE — Progress Notes (Addendum)
Due to national recommendations of social distancing related to the [place] pandemic, an audio/visual tele-health visit was felt to be the most appropriate encounter type for this patient today. I connected with  Omar Williams on 06/02/19 utilizing audio-only technology and verified that I am speaking with the correct person using two identifiers. The patient was located at their home, and I was located at home during the encounter. I discussed the limitations of evaluation and management by telemedicine. The patient expressed understanding and agreed to proceed.  Unfortunately due to technology failure, we are unable to use video during this visit.  Thus audio only technology was used.   Subjective:   Omar Williams is a 84 y.o. male who presents for Medicare Annual/Subsequent preventive examination.  Review of Systems:  Reviewed and negative Cardiac Risk Factors include: advanced age (>58mn, >>31women);diabetes mellitus;dyslipidemia;hypertension;male gender     Objective:    Vitals: BP 112/66   Pulse 65   Ht '5\' 7"'  (1.702 m)   Wt 178 lb (80.7 kg)   BMI 27.88 kg/m   Body mass index is 27.88 kg/m.  Advanced Directives 06/02/2019 01/25/2017 12/30/2016  Does Patient Have a Medical Advance Directive? No Yes No  Type of Advance Directive - Healthcare Power of Attorney -  Would patient like information on creating a medical advance directive? No - Patient declined - No - Patient declined    Tobacco Social History   Tobacco Use  Smoking Status Former Smoker  . Types: Cigarettes  . Quit date: 167 . Years since quitting: 61.3  Smokeless Tobacco Never Used     Counseling given: Not Answered   Clinical Intake:  Pre-visit preparation completed: Yes  Pain : No/denies pain     Nutritional Status: BMI 25 -29 Overweight Nutritional Risks: None Diabetes: Yes CBG done?: Yes CBG resulted in Enter/ Edit results?: No(119) Did pt. bring in CBG monitor from home?: Yes Glucose Meter  Downloaded?: No  How often do you need to have someone help you when you read instructions, pamphlets, or other written materials from your doctor or pharmacy?: 1 - Never What is the last grade level you completed in school?: Some college  Interpreter Needed?: No  Information entered by :: SJeralyn Ruths NP-C  Past Medical History:  Diagnosis Date  . Acid reflux   . Blood transfusion without reported diagnosis   . Cardiac arrest ([place]    a. occuring in 12/2016. LAD disease but no targets for CABG or PCI. Underwent ICD placement as EF 15-20%.   . CHF [place]congestive heart failure) ([place]   . Coronary artery disease   . [place] complication ([facility]   . Hyperlipidemia   . Hypertension   . [place]ocardial infarction ([place]   . Renal disorder    Past Surgical History:  Procedure Laterality Date  . BIV ICD INSERTION CRT-D N/A 01/01/2017   Procedure: BIV ICD INSERTION CRT-D;  Surgeon: CConstance Haw MD;  [facility];  Service: Cardiovascular;  Laterality: N/A;   Family History  Problem Relation Age of Onset  . Hypertension Mother   . Hyperlipidemia Mother   . CAD Sister   . Diabetes Sister   . Hyperlipidemia Sister   . Hypertension Sister   . CAD Brother   . Diabetes Brother   . Hypertension Brother   . Hyperlipidemia Brother   . CAD Brother   . Diabetes Brother   . Hypertension Brother   . Hyperlipidemia Brother   . Dementia  Brother   . Thyroid disease Daughter   . Hypertension Daughter   . Diabetes Daughter   . Hyperlipidemia Daughter   . Fibromyalgia Daughter   . Heart disease Daughter        stents  . Multiple sclerosis Daughter   . Thyroid disease Daughter    Social History   Socioeconomic History  . Marital status: Married    Spouse name: Omar Williams  . Number of children: 3  . Years of education: 40  . Highest education level: Some college, no degree  Occupational History  . Occupation: Radiographer, therapeutic   retired  . Occupation:  pastor  Tobacco Use  . Smoking status: Former Smoker    Types: Cigarettes    Quit date: 1960    Years since quitting: 61.3  . Smokeless tobacco: Never Used  Substance and Sexual Activity  . Alcohol use: No  . Drug use: No  . Sexual activity: Never  Other Topics Concern  . Not on file  Social History Narrative   Lives with with wife Omar Williams   Married 22 years   Daughter Omar Williams stays frequently   Granddaughter Omar Williams near by   [employer] of [employer] broadcast assistant Strain:   . Difficulty of Paying Living Expenses:   Food Insecurity:   . Worried About Charity fundraiser in the Last Year:   . Arboriculturist in the Last Year:   Transportation Needs:   . Film/video editor (Medical):   Omar Williams Lack of Transportation (Non-Medical):   Physical Activity:   . Days of Exercise per Week:   . Minutes of Exercise per Session:   Stress:   . Feeling of Stress :   Social Connections:   . Frequency of Communication with Friends and Family:   . Frequency of Social Gatherings with Friends and Family:   . Attends Religious Services:   . Active Member of Clubs or Organizations:   . Attends Archivist Meetings:   Omar Williams Marital Status:     Outpatient Encounter Medications as of 06/02/2019  Medication Sig  . ascorbic acid (VITAMIN C) 500 MG tablet Take 500 mg by mouth 2 (two) times daily.  Omar Williams aspirin EC 81 MG tablet Take 81 mg by mouth daily.  Omar Williams atorvastatin (LIPITOR) 80 MG tablet TAKE 1 TABLET BY MOUTH EVERYDAY AT BEDTIME  . Bee Pollen 550 MG CAPS Take 1 capsule by mouth 2 (two) times daily.   . blood glucose meter kit and supplies KIT Test daily.  Dx e11.22  . carvedilol (COREG) 6.25 MG tablet TAKE 1 TABLET BY MOUTH TWICE A DAY  . Cholecalciferol (VITAMIN D3) 125 MCG (5000 UT) TABS Take 1 tablet by mouth daily.   . CHROMIUM-CINNAMON PO Take 2,000 mg by mouth in the morning and at bedtime.  Omar Williams ezetimibe (ZETIA) 10 MG tablet TAKE 1 TABLET BY MOUTH EVERY DAY  . FIBER  ADULT GUMMIES PO Take 2 tablets by mouth at bedtime.  . finasteride (PROSCAR) 5 MG tablet Take 1 tablet (5 mg total) by mouth at bedtime.  Omar Williams glimepiride (AMARYL) 4 MG tablet TAKE 1 TABLET (4 MG TOTAL) BY MOUTH 2 (TWO) TIMES DAILY.  Omar Williams glucose blood (COOL BLOOD GLUCOSE TEST STRIPS) test strip One Touch Ultra test 1 time a day  . losartan (COZAAR) 100 MG tablet Take 0.5 tablets (50 mg total) by mouth 2 (two) times daily.  . nitroGLYCERIN (NITROSTAT) 0.4 MG SL tablet Place 1 tablet (0.4 mg  total) under the tongue every 5 (five) minutes x 3 doses as needed for chest pain.  . Omega-3 Fatty Acids (FISH OIL) 1200 MG CAPS Take 1 capsule by mouth 2 (two) times daily.   Omar Williams oxybutynin (DITROPAN) 5 MG tablet Take 2.5 mg by mouth 3 (three) times daily.  . tamsulosin (FLOMAX) 0.4 MG CAPS capsule Take 0.4 mg by mouth every morning.  . vitamin B-12 (CYANOCOBALAMIN) 1000 MCG tablet Take 1,000 mcg by mouth every morning.   . [DISCONTINUED] losartan (COZAAR) 100 MG tablet Take 0.5 tablets (50 mg total) by mouth 2 (two) times daily.   No facility-administered encounter medications on file as of 06/02/2019.    Activities of Daily Living In your present state of health, do you have any difficulty performing the following activities: 06/02/2019  Hearing? N  Vision? N  Difficulty concentrating or making decisions? N  Walking or climbing stairs? N  Dressing or bathing? N  Doing errands, shopping? N  Preparing Food and eating ? N  Using the Toilet? N  In the past six months, have you accidently leaked urine? N  Do you have problems with loss of bowel control? N  Managing your Medications? N  Managing your Finances? N  Housekeeping or managing your Housekeeping? N  Some recent data might be hidden    Patient Care Team: Doree Albee, MD as PCP - General (Internal Medicine) Herminio Commons, MD as PCP - Cardiology (Cardiology) Thompson Grayer, MD as PCP - Electrophysiology (Cardiology)   Assessment:    This is a routine wellness examination for Smiley.  Exercise Activities and Dietary recommendations Current Exercise Habits: Home exercise routine, Type of exercise: strength training/weights, Time (Minutes): 15, Frequency (Times/Week): 7, Weekly Exercise (Minutes/Week): 105, Intensity: Mild, Exercise limited by: cardiac condition(s)  Goals    . HEMOGLOBIN A1C < 7.0    . LDL CALC < 70       Fall Risk Fall Risk  06/02/2019 04/17/2019 10/06/2018 08/26/2017 05/25/2017  Falls in the past year? 1 0 1 No No  Comment - - fell 2 weeks ago; Dr Darnell Level removed stictches. - -  Number falls in past yr: 0 0 0 - -  Injury with Fall? 1 0 1 - -  Comment - - deep cut on forehead. completely heald today. - -  Risk for fall due to : History of fall(s) No Fall Risks - - -  Follow up Education provided;Falls prevention discussed;Falls evaluation completed Falls evaluation completed Falls evaluation completed - -   Is the patient's home free of loose throw rugs in walkways, pet beds, electrical cords, etc?   yes      Grab bars in the bathroom? yes      Handrails on the stairs?   yes      Adequate lighting?   yes  Timed Get Up and Go Performed: Not performed as office visit was conducted remotely  Depression Screen PHQ 2/9 Scores 06/02/2019 04/17/2019 10/06/2018 08/26/2017  PHQ - 2 Score 0 0 0 0  PHQ- 9 Score - - 0 -  Exception Documentation - Medical reason - -    Cognitive Function     6CIT Screen 06/02/2019  What Year? 0 points  What month? 0 points  What time? 0 points  Count back from 20 0 points  Months in reverse 4 points  Repeat phrase 4 points  Total Score 8    Immunization History  Administered Date(s) Administered  . Influenza, High Dose Seasonal PF  10/31/2016  . Influenza-Unspecified 12/04/2018  . Pneumococcal Conjugate-13 02/14/2015  . Pneumococcal Polysaccharide-23 02/02/2006, 08/26/2017  . Td 08/26/2017    Qualifies for Shingles Vaccine?  Yes  Screening Tests Health Maintenance    Topic Date Due  . OPHTHALMOLOGY EXAM  08/02/2017  . FOOT EXAM  02/23/2018  . COVID-19 Vaccine (1) 06/18/2019 (Originally 05/16/1951)  . INFLUENZA VACCINE  09/03/2019  . HEMOGLOBIN A1C  10/18/2019  . TETANUS/TDAP  08/27/2027  . PNA vac Low Risk Adult  Completed   Cancer Screenings: Lung: Low Dose CT Chest recommended if Age 17-80 years, 30 pack-year currently smoking OR have quit w/in 15years. Patient does not qualify. Colorectal: Does not qualify, patient has aged out of recommendation        Plan:   Patient is up-to-date with most immunizations.  He has not got the COVID-19 vaccination, I did recommend that he consider getting this administered.  He tells me that he feels he is at low risk for getting the virus because his blood type is O.  I did express to him that the majority of people in this country have old blood type and that many with this blood type are still susceptible to getting COVID-19, thus I recommended he get the vaccine.  He will consider this.  We also discussed shingles vaccine as he would be eligible for this and has not received this.  Tells me he had shingles 20 years ago, and I encouraged him to consider getting the vaccine administered despite the fact that he has had shingles and has recovered from it.  I did remind him that he should space out the COVID-19 vaccination and other vaccines by about 2 weeks.  He tells me he understands.  As for health maintenance he is generally up-to-date with health maintenance recommendations for his age.  We did discuss fall risk, especially in light of the fact that he has had a fall over the last year.  We did talk about ways to reduce his risk such as tacking down or removing area/throw rugs, using grab bars in bathrooms, using handrails when walking upstairs, remaining active as able.  He tells me he understands.  I also told him if he were to fall again and hit his head that he should follow-up in the hospital to make sure there  is no bleeding on his brain, and he voiced his understanding.  He would be interested in discussing the most form at his next office visit.  We did briefly discuss this today, however as this visit was done virtually I was unable to show him the form, but will plan on doing this at his next office visit.  He was encouraged to call this office with any questions or concerns prior to his next appointment.   I have personally reviewed and noted the following in the patient's chart:   . Medical and social history . Use of alcohol, tobacco or illicit drugs  . Current medications and supplements . Functional ability and status . Nutritional status . Physical activity . Advanced directives . List of other physicians . Hospitalizations, surgeries, and ER visits in previous 12 months . Vitals . Screenings to include cognitive, depression, and falls . Referrals and appointments  In addition, I have reviewed and discussed with patient certain preventive protocols, quality metrics, and best practice recommendations. A written personalized care plan for preventive services as well as general preventive health recommendations were provided to patient.     Plessis,  NP  06/02/2019

## 2019-06-05 ENCOUNTER — Ambulatory Visit (INDEPENDENT_AMBULATORY_CARE_PROVIDER_SITE_OTHER): Payer: Medicare Other | Admitting: Nurse Practitioner

## 2019-06-05 NOTE — Telephone Encounter (Signed)
Done

## 2019-06-16 DIAGNOSIS — R809 Proteinuria, unspecified: Secondary | ICD-10-CM | POA: Diagnosis not present

## 2019-06-16 DIAGNOSIS — E559 Vitamin D deficiency, unspecified: Secondary | ICD-10-CM | POA: Diagnosis not present

## 2019-06-16 DIAGNOSIS — E1129 Type 2 diabetes mellitus with other diabetic kidney complication: Secondary | ICD-10-CM | POA: Diagnosis not present

## 2019-06-16 DIAGNOSIS — Z79899 Other long term (current) drug therapy: Secondary | ICD-10-CM | POA: Diagnosis not present

## 2019-06-16 DIAGNOSIS — I129 Hypertensive chronic kidney disease with stage 1 through stage 4 chronic kidney disease, or unspecified chronic kidney disease: Secondary | ICD-10-CM | POA: Diagnosis not present

## 2019-06-16 DIAGNOSIS — N189 Chronic kidney disease, unspecified: Secondary | ICD-10-CM | POA: Diagnosis not present

## 2019-06-16 DIAGNOSIS — E1122 Type 2 diabetes mellitus with diabetic chronic kidney disease: Secondary | ICD-10-CM | POA: Diagnosis not present

## 2019-06-19 ENCOUNTER — Other Ambulatory Visit: Payer: Self-pay

## 2019-06-19 ENCOUNTER — Encounter: Payer: Self-pay | Admitting: Cardiovascular Disease

## 2019-06-19 ENCOUNTER — Ambulatory Visit (INDEPENDENT_AMBULATORY_CARE_PROVIDER_SITE_OTHER): Payer: Medicare Other | Admitting: Cardiovascular Disease

## 2019-06-19 VITALS — BP 98/64 | HR 61 | Ht 67.0 in | Wt 181.0 lb

## 2019-06-19 DIAGNOSIS — I4901 Ventricular fibrillation: Secondary | ICD-10-CM

## 2019-06-19 DIAGNOSIS — E785 Hyperlipidemia, unspecified: Secondary | ICD-10-CM

## 2019-06-19 DIAGNOSIS — I255 Ischemic cardiomyopathy: Secondary | ICD-10-CM | POA: Diagnosis not present

## 2019-06-19 DIAGNOSIS — I25118 Atherosclerotic heart disease of native coronary artery with other forms of angina pectoris: Secondary | ICD-10-CM | POA: Diagnosis not present

## 2019-06-19 DIAGNOSIS — Z95810 Presence of automatic (implantable) cardiac defibrillator: Secondary | ICD-10-CM

## 2019-06-19 DIAGNOSIS — I5022 Chronic systolic (congestive) heart failure: Secondary | ICD-10-CM

## 2019-06-19 MED ORDER — LOSARTAN POTASSIUM 25 MG PO TABS: 25.0000 mg | ORAL_TABLET | Freq: Two times a day (BID) | ORAL | 6 refills | Status: DC

## 2019-06-19 NOTE — Addendum Note (Signed)
Addended by: Lesle Chris on: 06/19/2019 02:27 PM   Modules accepted: Orders

## 2019-06-19 NOTE — Patient Instructions (Signed)
Medication Instructions:   Decrease Losartan to 25mg  twice a day.  Continue all other medications.    Labwork: none  Testing/Procedures: none  Follow-Up: 6 months   Any Other Special Instructions Will Be Listed Below (If Applicable).  If you need a refill on your cardiac medications before your next appointment, please call your pharmacy.

## 2019-06-19 NOTE — Progress Notes (Signed)
SUBJECTIVE: Omar Williams is a 84 y.o. male with a history of ischemic cardiomyopathy, biventricular ICD, and multivessel coronary artery disease.  The patient denies any symptoms of chest pain, palpitations, shortness of breath, lightheadedness, dizziness, leg swelling, orthopnea, PND, and syncope.  He lifts 2 pound weights with each arm about 50 times per arm and does 20 leg extensions with each leg every night before he goes to bed.  He said church attendance has been somewhat low due to the pandemic but hopes things will improve in the near future given the relaxing of restrictions.  I personally reviewed the ECG performed today which demonstrates AV pacing.   Social history:His wife, Omar Williams also my patient. He is a Company secretary.   Review of Systems: As per "subjective", otherwise negative.  No Known Allergies  Current Outpatient Medications  Medication Sig Dispense Refill  . ascorbic acid (VITAMIN C) 500 MG tablet Take 500 mg by mouth 2 (two) times daily.    Marland Kitchen aspirin EC 81 MG tablet Take 81 mg by mouth daily.    Marland Kitchen atorvastatin (LIPITOR) 80 MG tablet TAKE 1 TABLET BY MOUTH EVERYDAY AT BEDTIME 90 tablet 1  . Bee Pollen 550 MG CAPS Take 1 capsule by mouth 2 (two) times daily.     . blood glucose meter kit and supplies KIT Test daily.  Dx e11.22 1 each 0  . carvedilol (COREG) 6.25 MG tablet TAKE 1 TABLET BY MOUTH TWICE A DAY 180 tablet 0  . Cholecalciferol (VITAMIN D3) 125 MCG (5000 UT) TABS Take 1 tablet by mouth daily.     . CHROMIUM-CINNAMON PO Take 2,000 mg by mouth in the morning and at bedtime.    Marland Kitchen ezetimibe (ZETIA) 10 MG tablet TAKE 1 TABLET BY MOUTH EVERY DAY 90 tablet 1  . FIBER ADULT GUMMIES PO Take 2 tablets by mouth at bedtime.    . finasteride (PROSCAR) 5 MG tablet Take 1 tablet (5 mg total) by mouth at bedtime. 90 tablet 3  . glimepiride (AMARYL) 4 MG tablet TAKE 1 TABLET (4 MG TOTAL) BY MOUTH 2 (TWO) TIMES DAILY. 180 tablet 0  . glucose blood (COOL BLOOD  GLUCOSE TEST STRIPS) test strip One Touch Ultra test 1 time a day 100 each 2  . losartan (COZAAR) 100 MG tablet Take 0.5 tablets (50 mg total) by mouth 2 (two) times daily. 90 tablet 0  . nitroGLYCERIN (NITROSTAT) 0.4 MG SL tablet Place 1 tablet (0.4 mg total) under the tongue every 5 (five) minutes x 3 doses as needed for chest pain. 25 tablet 3  . Omega-3 Fatty Acids (FISH OIL) 1200 MG CAPS Take 1 capsule by mouth 2 (two) times daily.     Marland Kitchen oxybutynin (DITROPAN) 5 MG tablet Take 2.5 mg by mouth 3 (three) times daily.    . tamsulosin (FLOMAX) 0.4 MG CAPS capsule Take 0.4 mg by mouth every morning.    . vitamin B-12 (CYANOCOBALAMIN) 1000 MCG tablet Take 1,000 mcg by mouth every morning.      No current facility-administered medications for this visit.    Past Medical History:  Diagnosis Date  . Acid reflux   . Blood transfusion without reported diagnosis   . Cardiac arrest ([place])    a. occuring in 12/2016. LAD disease but no targets for CABG or PCI. Underwent ICD placement as EF 15-20%.   . CHF (congestive heart failure) ([place])   . Coronary artery disease   . Diabetes mellitus without complication ([place])   .  Hyperlipidemia   . Hypertension   . Myocardial infarction ([place])   . Renal disorder     Past Surgical History:  Procedure Laterality Date  . BIV ICD INSERTION CRT-D N/A 01/01/2017   Procedure: BIV ICD INSERTION CRT-D;  Surgeon: Constance Haw, MD;  Location: [facility];  Service: Cardiovascular;  Laterality: N/A;    Social History   Socioeconomic History  . Marital status: Married    Spouse name: Omar Williams  . Number of children: 3  . Years of education: 77  . Highest education level: Some college, no degree  Occupational History  . Occupation: Radiographer, therapeutic   retired  . Occupation: pastor  Tobacco Use  . Smoking status: Former Smoker    Types: Cigarettes    Quit date: 1960    Years since quitting: 61.4  . Smokeless tobacco: Never Used  Substance and  Sexual Activity  . Alcohol use: No  . Drug use: No  . Sexual activity: Never  Other Topics Concern  . Not on file  Social History Narrative   Lives with with wife Omar Williams   Married 15 years   Daughter Omar Williams stays frequently   Granddaughter Omar Williams near by   [employer] of Radio broadcast assistant Strain:   . Difficulty of Paying Living Expenses:   Food Insecurity:   . Worried About Charity fundraiser in the Last Year:   . Arboriculturist in the Last Year:   Transportation Needs:   . Film/video editor (Medical):   Marland Kitchen Lack of Transportation (Non-Medical):   Physical Activity:   . Days of Exercise per Week:   . Minutes of Exercise per Session:   Stress:   . Feeling of Stress :   Social Connections:   . Frequency of Communication with Friends and Family:   . Frequency of Social Gatherings with Friends and Family:   . Attends Religious Services:   . Active Member of Clubs or Organizations:   . Attends Archivist Meetings:   Marland Kitchen Marital Status:   Intimate Partner Violence:   . Fear of Current or Ex-Partner:   . Emotionally Abused:   Marland Kitchen Physically Abused:   . Sexually Abused:      Vitals:   06/19/19 1359  BP: 98/64  Pulse: 61  SpO2: 98%  Weight: 181 lb (82.1 kg)  Height: '5\' 7"'  (1.702 m)    Wt Readings from Last 3 Encounters:  06/19/19 181 lb (82.1 kg)  06/02/19 178 lb (80.7 kg)  04/17/19 183 lb 3.2 oz (83.1 kg)     PHYSICAL EXAM General: NAD HEENT: Normal. Neck: No JVD, no thyromegaly. Lungs: Clear to auscultation bilaterally with normal respiratory effort. CV: Regular rate and rhythm, normal S1/S2, no S3/S4, no murmur. No pretibial or periankle edema.  No carotid bruit.   Abdomen: Soft, nontender, no distention.  Neurologic: Alert and oriented.  Psych: Normal affect. Skin: Normal. Musculoskeletal: No gross deformities.      Labs: Lab Results  Component Value Date/Time   K 5.1 04/17/2019 08:58 AM   BUN 20 04/17/2019 08:58  AM   CREATININE 1.40 (H) 04/17/2019 08:58 AM   ALT 22 04/17/2019 08:58 AM   HGB 13.2 02/09/2017 02:29 PM     Lipids: Lab Results  Component Value Date/Time   LDLCALC 95 08/23/2017 09:17 AM   CHOL 165 08/23/2017 09:17 AM   TRIG 110 08/23/2017 09:17 AM   HDL 49 08/23/2017 09:17 AM  Echo 06/09/17:  Study Conclusions  - Left ventricle: The cavity size was normal. Wall thickness was normal. Systolic function was mildly to moderately reduced. The estimated ejection fraction was in the range of 40% to 45%. Diffuse hypokinesis. Doppler parameters are consistent with abnormal left ventricular relaxation (grade 1 diastolic dysfunction). - Aortic valve: Poorly visualized. Probably trileaflet. There was mild regurgitation. - Mitral valve: There was trivial regurgitation. - Left atrium: The atrium was mildly dilated. - Right ventricle: Pacer wire or catheter noted in right ventricle. - Right atrium: Central venous pressure (est): 3 mm Hg. - Atrial septum: No defect or patent foramen ovale was identified. - Tricuspid valve: There was trivial regurgitation. - Pulmonary arteries: PA peak pressure: 13 mm Hg (S). - Pericardium, extracardiac: There was no pericardial effusion.  Impressions:  - Images are limited, but LVEF has improved in comparison to the previous study in November 2018.    ASSESSMENT AND PLAN:  1. Chronic systolic heart failure: Symptomatically stable.LVEF has improved to 40 to 45%. I will continue carvedilol 6.25 mg twice daily and I will reduce losartan to 25 mg twice daily given low normal BP.  2. Ischemic cardiomyopathy status post biventricular ICD in November 2018: Symptomatically stable. Followed by EP.LVEF has improved to 40 to 45%by echocardiogram on 06/09/2017.  Continue beta-blocker.  3. Multivessel coronary artery disease: Stable ischemic heart disease. He has a tight proximal LAD stenosis with the remainder of the vessel  being small in caliber. The circumflex had mild proximal narrowing in the RCA had no significant stenosis. There were no graftable targets nor good targets for PCI. Continue aspirin, statin, and beta-blocker.  4. Hyperlipidemia: He is on high intensity statin therapy with Lipitor 80 mg and Zetia 10 mg daily.[id number] on 07/05/18.We previously talked about trying to avoid fatty and fried foods.    Disposition: Follow up 6 months   Kate Sable, M.D., F.A.C.C.

## 2019-06-23 ENCOUNTER — Ambulatory Visit: Payer: Medicare Other | Admitting: Cardiovascular Disease

## 2019-06-23 DIAGNOSIS — E1129 Type 2 diabetes mellitus with other diabetic kidney complication: Secondary | ICD-10-CM | POA: Diagnosis not present

## 2019-06-23 DIAGNOSIS — E1122 Type 2 diabetes mellitus with diabetic chronic kidney disease: Secondary | ICD-10-CM | POA: Diagnosis not present

## 2019-06-23 DIAGNOSIS — N189 Chronic kidney disease, unspecified: Secondary | ICD-10-CM | POA: Diagnosis not present

## 2019-06-23 DIAGNOSIS — R809 Proteinuria, unspecified: Secondary | ICD-10-CM | POA: Diagnosis not present

## 2019-06-23 DIAGNOSIS — E875 Hyperkalemia: Secondary | ICD-10-CM | POA: Diagnosis not present

## 2019-06-23 DIAGNOSIS — I129 Hypertensive chronic kidney disease with stage 1 through stage 4 chronic kidney disease, or unspecified chronic kidney disease: Secondary | ICD-10-CM | POA: Diagnosis not present

## 2019-06-26 ENCOUNTER — Ambulatory Visit (INDEPENDENT_AMBULATORY_CARE_PROVIDER_SITE_OTHER): Payer: Medicare Other

## 2019-06-26 DIAGNOSIS — Z95810 Presence of automatic (implantable) cardiac defibrillator: Secondary | ICD-10-CM | POA: Diagnosis not present

## 2019-06-26 DIAGNOSIS — I5022 Chronic systolic (congestive) heart failure: Secondary | ICD-10-CM | POA: Diagnosis not present

## 2019-06-27 NOTE — Progress Notes (Signed)
EPIC Encounter for ICM Monitoring  Patient Name: Omar Williams is a 84 y.o. male Date: 06/27/2019 Primary Care Physican: Wilson Singer, MD Primary Cardiologist: Purvis Sheffield Electrophysiologist:Allred Nephrologist:[facility] Bi-V Pacing:99.8% 3/19/2021Weight:182.2lbs  Spoke with patient and reports feeling well at this time.  Denies fluid symptoms. He frequently eats restaurant foods.  Nephrologist instructed him to eat low potassium diet due to hyperkalemia at 06/23/19 OV.  [place] the beach from [place]19[place]28.  OptivolThoracic impedancesuggesting normal.  Prescribed:No diuretic  Labs: 5/28 repeat labs to check potassium scheduled by nephrology 06/16/2019 Creatinine 1.56, BUN 23, Potassium 5.5, Sodium 143, GFR 41-47 Care Everywhere  Recommendations: No changes and encouraged to call if experiencing any fluid symptoms.  Follow-up plan: ICM clinic phone appointment on6/28/2021. 91 day device clinic remote transmission6/10/2019. Office visit with Dr Purvis Sheffield 06/19/2019.  Copy of ICM check sent to Dr.Allred.  3 month ICM trend: 06/26/2019    1 Year ICM trend:       Karie Soda, RN 06/27/2019 10:15 AM

## 2019-06-30 DIAGNOSIS — I129 Hypertensive chronic kidney disease with stage 1 through stage 4 chronic kidney disease, or unspecified chronic kidney disease: Secondary | ICD-10-CM | POA: Diagnosis not present

## 2019-06-30 DIAGNOSIS — N189 Chronic kidney disease, unspecified: Secondary | ICD-10-CM | POA: Diagnosis not present

## 2019-06-30 DIAGNOSIS — E1129 Type 2 diabetes mellitus with other diabetic kidney complication: Secondary | ICD-10-CM | POA: Diagnosis not present

## 2019-06-30 DIAGNOSIS — E1122 Type 2 diabetes mellitus with diabetic chronic kidney disease: Secondary | ICD-10-CM | POA: Diagnosis not present

## 2019-06-30 DIAGNOSIS — R809 Proteinuria, unspecified: Secondary | ICD-10-CM | POA: Diagnosis not present

## 2019-06-30 DIAGNOSIS — E875 Hyperkalemia: Secondary | ICD-10-CM | POA: Diagnosis not present

## 2019-07-12 ENCOUNTER — Ambulatory Visit (INDEPENDENT_AMBULATORY_CARE_PROVIDER_SITE_OTHER): Payer: Medicare Other | Admitting: *Deleted

## 2019-07-12 DIAGNOSIS — I255 Ischemic cardiomyopathy: Secondary | ICD-10-CM

## 2019-07-12 LAB — CUP PACEART REMOTE DEVICE CHECK
Battery Remaining Longevity: 69 mo
Battery Voltage: 2.98 V
Brady Statistic AP VP Percent: 69.96 %
Brady Statistic AP VS Percent: 0.02 %
Brady Statistic AS VP Percent: 29.97 %
Brady Statistic AS VS Percent: 0.05 %
Brady Statistic RA Percent Paced: 69.97 %
Brady Statistic RV Percent Paced: 99.38 %
Date Time Interrogation Session: 20210609033322
HighPow Impedance: 79 Ohm
Implantable Lead Implant Date: 20181130
Implantable Lead Implant Date: 20181130
Implantable Lead Implant Date: 20181130
Implantable Lead Location: 753858
Implantable Lead Location: 753859
Implantable Lead Location: 753860
Implantable Lead Model: 4598
Implantable Lead Model: 5076
Implantable Lead Model: 6935
Implantable Pulse Generator Implant Date: 20181130
Lead Channel Impedance Value: 145.871
Lead Channel Impedance Value: 145.871
Lead Channel Impedance Value: 145.871
Lead Channel Impedance Value: 161.5 Ohm
Lead Channel Impedance Value: 161.5 Ohm
Lead Channel Impedance Value: 266 Ohm
Lead Channel Impedance Value: 323 Ohm
Lead Channel Impedance Value: 323 Ohm
Lead Channel Impedance Value: 323 Ohm
Lead Channel Impedance Value: 380 Ohm
Lead Channel Impedance Value: 399 Ohm
Lead Channel Impedance Value: 456 Ohm
Lead Channel Impedance Value: 456 Ohm
Lead Channel Impedance Value: 494 Ohm
Lead Channel Impedance Value: 513 Ohm
Lead Channel Impedance Value: 513 Ohm
Lead Channel Impedance Value: 589 Ohm
Lead Channel Impedance Value: 646 Ohm
Lead Channel Pacing Threshold Amplitude: 0.375 V
Lead Channel Pacing Threshold Amplitude: 0.875 V
Lead Channel Pacing Threshold Amplitude: 1 V
Lead Channel Pacing Threshold Pulse Width: 0.4 ms
Lead Channel Pacing Threshold Pulse Width: 0.4 ms
Lead Channel Pacing Threshold Pulse Width: 0.4 ms
Lead Channel Sensing Intrinsic Amplitude: 10.375 mV
Lead Channel Sensing Intrinsic Amplitude: 10.375 mV
Lead Channel Sensing Intrinsic Amplitude: 2.25 mV
Lead Channel Sensing Intrinsic Amplitude: 2.25 mV
Lead Channel Setting Pacing Amplitude: 1.5 V
Lead Channel Setting Pacing Amplitude: 2 V
Lead Channel Setting Pacing Amplitude: 2.5 V
Lead Channel Setting Pacing Pulse Width: 0.4 ms
Lead Channel Setting Pacing Pulse Width: 0.4 ms
Lead Channel Setting Sensing Sensitivity: 0.3 mV

## 2019-07-13 NOTE — Progress Notes (Signed)
Remote ICD transmission.   

## 2019-07-17 ENCOUNTER — Other Ambulatory Visit (INDEPENDENT_AMBULATORY_CARE_PROVIDER_SITE_OTHER): Payer: Self-pay | Admitting: Internal Medicine

## 2019-07-31 ENCOUNTER — Ambulatory Visit (INDEPENDENT_AMBULATORY_CARE_PROVIDER_SITE_OTHER): Payer: Medicare Other

## 2019-07-31 DIAGNOSIS — Z95810 Presence of automatic (implantable) cardiac defibrillator: Secondary | ICD-10-CM | POA: Diagnosis not present

## 2019-07-31 DIAGNOSIS — I5022 Chronic systolic (congestive) heart failure: Secondary | ICD-10-CM | POA: Diagnosis not present

## 2019-08-01 NOTE — Progress Notes (Signed)
EPIC Encounter for ICM Monitoring  Patient Name: Omar Williams is a 84 y.o. male Date: 08/01/2019 Primary Care Physican: Wilson Singer, MD Primary Cardiologist: Purvis Sheffield Electrophysiologist:Allred Nephrologist:[facility] Bi-V Pacing:99.8% 6/29/2021Weight:181lbs  Spoke with patient and reports feeling well at this time.  Denies fluid symptoms.  OptivolThoracic impedancesuggestingnormal.  Prescribed:No diuretic  Labs: 06/16/2019 Creatinine 1.56, BUN 23, Potassium 5.5, Sodium 143, GFR 41-47 Care Everywhere  Recommendations:No changes and encouraged to call if experiencing any fluid symptoms.  Follow-up plan: ICM clinic phone appointment on8/03/2019. 91 day device clinic remote transmission9/09/2019.   Copy of ICM check sent to Dr.Allred.  3 month ICM trend: 07/31/2019    1 Year ICM trend:       Karie Soda, RN 08/01/2019 4:12 PM

## 2019-08-08 ENCOUNTER — Telehealth (INDEPENDENT_AMBULATORY_CARE_PROVIDER_SITE_OTHER): Payer: Self-pay | Admitting: Nurse Practitioner

## 2019-08-08 ENCOUNTER — Other Ambulatory Visit: Payer: Self-pay

## 2019-08-08 ENCOUNTER — Ambulatory Visit (INDEPENDENT_AMBULATORY_CARE_PROVIDER_SITE_OTHER): Payer: Medicare Other | Admitting: Nurse Practitioner

## 2019-08-08 ENCOUNTER — Encounter (INDEPENDENT_AMBULATORY_CARE_PROVIDER_SITE_OTHER): Payer: Self-pay | Admitting: Nurse Practitioner

## 2019-08-08 VITALS — BP 115/65 | HR 70 | Temp 97.2°F | Ht 67.0 in | Wt 181.8 lb

## 2019-08-08 DIAGNOSIS — E1122 Type 2 diabetes mellitus with diabetic chronic kidney disease: Secondary | ICD-10-CM | POA: Diagnosis not present

## 2019-08-08 DIAGNOSIS — E785 Hyperlipidemia, unspecified: Secondary | ICD-10-CM | POA: Diagnosis not present

## 2019-08-08 DIAGNOSIS — N183 Chronic kidney disease, stage 3 unspecified: Secondary | ICD-10-CM

## 2019-08-08 DIAGNOSIS — I10 Essential (primary) hypertension: Secondary | ICD-10-CM

## 2019-08-08 DIAGNOSIS — Z87891 Personal history of nicotine dependence: Secondary | ICD-10-CM

## 2019-08-08 DIAGNOSIS — E559 Vitamin D deficiency, unspecified: Secondary | ICD-10-CM | POA: Diagnosis not present

## 2019-08-08 DIAGNOSIS — Z136 Encounter for screening for cardiovascular disorders: Secondary | ICD-10-CM

## 2019-08-08 DIAGNOSIS — I2510 Atherosclerotic heart disease of native coronary artery without angina pectoris: Secondary | ICD-10-CM | POA: Diagnosis not present

## 2019-08-08 MED ORDER — CARVEDILOL 6.25 MG PO TABS: 6.2500 mg | ORAL_TABLET | Freq: Two times a day (BID) | ORAL | 0 refills | Status: DC

## 2019-08-08 MED ORDER — GLIMEPIRIDE 4 MG PO TABS: 4.0000 mg | ORAL_TABLET | Freq: Two times a day (BID) | ORAL | 0 refills | Status: DC

## 2019-08-08 MED ORDER — ATORVASTATIN CALCIUM 80 MG PO TABS: 80.0000 mg | ORAL_TABLET | Freq: Every day | ORAL | 1 refills | Status: DC

## 2019-08-08 NOTE — Progress Notes (Signed)
Subjective:  Patient ID: Omar Williams, male    DOB: Feb 06, 1935  Age: 84 y.o. MRN: [id number]  CC:  Chief Complaint  Patient presents with  . Hypertension  . Diabetes  . Follow-up  . Coronary Artery Disease  . Hyperlipidemia  . Other    Vitamin D deficiency, Screening      HPI  This patient comes in today for the above.  Hypertension: He continues on his chronic antihypertensives and is tolerating these well.  He also follows up with cardiology on a regular basis for his hypertension, coronary artery disease, and heart failure.  Diabetes: Last A1c was 7.8.  He continues on glimepiride alone.  He has seen eye doctor within the last year and is scheduled to see them again next month.  Coronary artery disease: He continues to follow-up with cardiology for this.  He denies any recent chest pain.  Hyperlipidemia: He continues on aspirin, Zetia, and atorvastatin.  He is due for lipid panel to be checked today.  Last LDL was 94 this collected in June 2020.  Vitamin D deficiency: He continues to take 5000 IUs of vitamin D3 daily.  He is due for serum check today.  Screenings: Last time I spoke to him was on the phone for his annual wellness visit.  At that time he was interested in looking at a MOST form, I will provide this to him today.  At some point in the past he had been ordered to undergo ultrasound for AAA screening.  I do not see where this was ever completed.  We will need to discuss whether or not you like to do this screening.  He does have a history of smoking cigarettes.  He tells me that he smoked for 2 to 3 years when he was a teenager.   Past Medical History:  Diagnosis Date  . Acid reflux   . Blood transfusion without reported diagnosis   . Cardiac arrest ([place])    a. occuring in 12/2016. LAD disease but no targets for CABG or PCI. Underwent ICD placement as EF 15-20%.   . CHF (congestive heart failure) ([place])   . Coronary artery disease   . Diabetes mellitus  without complication ([place])   . Hyperlipidemia   . Hypertension   . Myocardial infarction ([place])   . Renal disorder       Family History  Problem Relation Age of Onset  . Hypertension Mother   . Hyperlipidemia Mother   . CAD Sister   . Diabetes Sister   . Hyperlipidemia Sister   . Hypertension Sister   . CAD Brother   . Diabetes Brother   . Hypertension Brother   . Hyperlipidemia Brother   . CAD Brother   . Diabetes Brother   . Hypertension Brother   . Hyperlipidemia Brother   . Dementia Brother   . Thyroid disease Daughter   . Hypertension Daughter   . Diabetes Daughter   . Hyperlipidemia Daughter   . Fibromyalgia Daughter   . Heart disease Daughter        stents  . Multiple sclerosis Daughter   . Thyroid disease Daughter     Social History   Social History Narrative   Lives with with wife [name]   Married 37 years   Daughter [name] stays frequently   Granddaughter [name] near by   Social History   Tobacco Use  . Smoking status: Former Smoker    Types: Cigarettes    Quit  date: 61    Years since quitting: 61.5  . Smokeless tobacco: Never Used  . Tobacco comment: 2-3 years as a teenager  Substance Use Topics  . Alcohol use: No     Current Meds  Medication Sig  . ascorbic acid (VITAMIN C) 500 MG tablet Take 500 mg by mouth 2 (two) times daily.  Marland Kitchen aspirin EC 81 MG tablet Take 81 mg by mouth daily.  Marland Kitchen atorvastatin (LIPITOR) 80 MG tablet TAKE 1 TABLET BY MOUTH EVERYDAY AT BEDTIME  . Bee Pollen 550 MG CAPS Take 1 capsule by mouth 2 (two) times daily.   . blood glucose meter kit and supplies KIT Test daily.  Dx e11.22  . carvedilol (COREG) 6.25 MG tablet TAKE 1 TABLET BY MOUTH TWICE A DAY  . Cholecalciferol (VITAMIN D3) 125 MCG (5000 UT) TABS Take 1 tablet by mouth daily.   . CHROMIUM-CINNAMON PO Take 2,000 mg by mouth in the morning and at bedtime.  Marland Kitchen ezetimibe (ZETIA) 10 MG tablet TAKE 1 TABLET BY MOUTH EVERY DAY  . FIBER ADULT GUMMIES PO Take 2  tablets by mouth at bedtime.  . finasteride (PROSCAR) 5 MG tablet Take 1 tablet (5 mg total) by mouth at bedtime.  Marland Kitchen glimepiride (AMARYL) 4 MG tablet TAKE 1 TABLET (4 MG TOTAL) BY MOUTH 2 (TWO) TIMES DAILY.  Marland Kitchen glucose blood (COOL BLOOD GLUCOSE TEST STRIPS) test strip One Touch Ultra test 1 time a day  . losartan (COZAAR) 25 MG tablet Take 1 tablet (25 mg total) by mouth 2 (two) times daily.  . nitroGLYCERIN (NITROSTAT) 0.4 MG SL tablet Place 1 tablet (0.4 mg total) under the tongue every 5 (five) minutes x 3 doses as needed for chest pain.  . Omega-3 Fatty Acids (FISH OIL) 1200 MG CAPS Take 1 capsule by mouth 2 (two) times daily.   Marland Kitchen oxybutynin (DITROPAN) 5 MG tablet Take 2.5 mg by mouth 3 (three) times daily.  . tamsulosin (FLOMAX) 0.4 MG CAPS capsule Take 0.4 mg by mouth every morning.  . vitamin B-12 (CYANOCOBALAMIN) 1000 MCG tablet Take 1,000 mcg by mouth every morning.     ROS:  Review of Systems  Constitutional: Positive for malaise/fatigue.  Eyes: Negative for blurred vision.  Respiratory: Negative for shortness of breath.   Cardiovascular: Negative for chest pain and palpitations.  Neurological: Negative for dizziness and headaches.     Objective:   Today's Vitals: BP 115/65 (BP Location: Left Arm, Patient Position: Sitting, Cuff Size: Normal)   Pulse 70   Temp (!) 97.2 F (36.2 C) (Temporal)   Ht _0  (1.702 m)   Wt 181 lb 12.8 oz (82.5 kg)   SpO2 94%   BMI 28.47 kg/m  Vitals with BMI 08/08/2019 06/19/2019 06/02/2019  Height _1  _2  _3   Weight 181 lbs 13 oz 181 lbs 178 lbs  BMI 28.47 10.25 85.27  Systolic 782 98 423  Diastolic 65 64 66  Pulse 70 61 65     Physical Exam Vitals reviewed.  Constitutional:      Appearance: Normal appearance.  HENT:     Head: Normocephalic and atraumatic.  Cardiovascular:     Rate and Rhythm: Normal rate and regular rhythm.  Pulmonary:     Effort: Pulmonary effort is normal.     Breath sounds: Normal breath sounds.    Musculoskeletal:     Cervical back: Neck supple.  Skin:    General: Skin is warm and dry.  Neurological:  Mental Status: He is alert and oriented to person, place, and time.  Psychiatric:        Mood and Affect: Mood normal.        Behavior: Behavior normal.        Thought Content: Thought content normal.        Judgment: Judgment normal.          Assessment and Plan   1. Essential hypertension   2. Type 2 diabetes mellitus with stage 3 chronic kidney disease, without long-term current use of insulin, unspecified whether stage 3a or 3b CKD ([place])   3. Vitamin D deficiency   4. Coronary artery disease involving native coronary artery of native heart without angina pectoris   5. Hyperlipidemia LDL goal <70   6. Screening for AAA (abdominal aortic aneurysm)   7. Personal history of nicotine dependence       Plan: 1., 4., 5.  He will continue on his current antihypertensive as prescribed.  He will follow-up with cardiology as scheduled.  Collect the panel today for further evaluation.  He tells me he is fasting at this time.  2.  He will continue on his current medications for his diabetes as prescribed.  I will send refill today.  Goal A1c for him is less than 8.0, so he is at goal currently.  I will check his urine for albuminuria today.  He was encouraged to see his eye doctor as scheduled.  He will continue on his statin and he will continue on his ARB.  3.  We will check serum level of vitamin D today.  In the meantime he will continue on his current supplement dose.  6.-7.  He would like to undergo ultrasound screening for AAA.  I will order this in he will be scheduled at [facility].  I did provide him with MOST form today, he would like to discuss this with his wife.  I given him a copy so that he can discuss with his wife and if you would like to fill this out at his next office visit he may do so.  Tests ordered Orders Placed This Encounter  Procedures  .  US AORT[facility] MEDICARE SCREENING  . Lipid Panel  . Hemoglobin A1c  . Microalbumin/Creatinine Ratio, Urine  . CMP  . Vitamin D, 25-hydroxy      No orders of the defined types were placed in this encounter.   Patient to follow-up in 3 months or sooner as needed.  Ailene Ards, NP

## 2019-08-08 NOTE — Telephone Encounter (Addendum)
I called this patient to let him know that insurance will probably not pay for AAA screen via ultrasound due to his age. I also verified that he is taking glimepiride 4mg  BID. He tells me he understands and did verify that he takes the glimepiride twice a day.

## 2019-08-09 LAB — COMPREHENSIVE METABOLIC PANEL
AG Ratio: 1.5 (calc) (ref 1.0–2.5)
ALT: 22 U/L (ref 9–46)
AST: 18 U/L (ref 10–35)
Albumin: 4.4 g/dL (ref 3.6–5.1)
Alkaline phosphatase (APISO): 59 U/L (ref 35–144)
BUN/Creatinine Ratio: 18 (calc) (ref 6–22)
BUN: 25 mg/dL (ref 7–25)
CO2: 24 mmol/L (ref 20–32)
Calcium: 9.8 mg/dL (ref 8.6–10.3)
Chloride: 108 mmol/L (ref 98–110)
Creat: 1.38 mg/dL — ABNORMAL HIGH (ref 0.70–1.11)
Globulin: 2.9 g/dL (calc) (ref 1.9–3.7)
Glucose, Bld: 130 mg/dL — ABNORMAL HIGH (ref 65–99)
Potassium: 4.5 mmol/L (ref 3.5–5.3)
Sodium: 141 mmol/L (ref 135–146)
Total Bilirubin: 1.1 mg/dL (ref 0.2–1.2)
Total Protein: 7.3 g/dL (ref 6.1–8.1)

## 2019-08-09 LAB — MICROALBUMIN / CREATININE URINE RATIO
Creatinine, Urine: 101 mg/dL (ref 20–320)
Microalb Creat Ratio: 145 mcg/mg creat — ABNORMAL HIGH (ref <30)
Microalb, Ur: 14.6 mg/dL

## 2019-08-09 LAB — LIPID PANEL
Cholesterol: 134 mg/dL (ref <200)
HDL: 43 mg/dL (ref >=40)
LDL Cholesterol (Calc): 69 mg/dL (calc)
Non-HDL Cholesterol (Calc): 91 mg/dL (calc) (ref <130)
Total CHOL/HDL Ratio: 3.1 (calc) (ref <5.0)
Triglycerides: 132 mg/dL (ref <150)

## 2019-08-09 LAB — HEMOGLOBIN A1C
Hgb A1c MFr Bld: 7.6 % of total Hgb — ABNORMAL HIGH (ref <5.7)
Mean Plasma Glucose: 171 (calc)
eAG (mmol/L): 9.5 (calc)

## 2019-08-09 LAB — VITAMIN D 25 HYDROXY (VIT D DEFICIENCY, FRACTURES): Vit D, 25-Hydroxy: 65 ng/mL (ref 30–100)

## 2019-08-13 ENCOUNTER — Other Ambulatory Visit (INDEPENDENT_AMBULATORY_CARE_PROVIDER_SITE_OTHER): Payer: Self-pay | Admitting: Internal Medicine

## 2019-08-13 ENCOUNTER — Other Ambulatory Visit: Payer: Self-pay | Admitting: Cardiovascular Disease

## 2019-08-24 NOTE — Progress Notes (Signed)
Subjective:  1. BPH with obstruction/lower urinary tract symptoms   2. Nocturia   3. History of nephrolithiasis     .   [name] returns today in f/u. he has a history of stones and renal cysts. Renal US prior to his last visit showed no stones but there are stable renal cysts. He has had no flank pain or hematuria.    He remains on tamsulosin and finasteride. He was given Myrbetriq which worked but was too expensive. He was given Oxybutynin and is taking 2.25m tid with improvement in the urgency and nocturia. He has nocturia up to 3x. His IPSS is 5. He has no associated signs or symptoms.   IPSS    Row Name 08/25/19 1400         International Prostate Symptom Score   How often have you had the sensation of not emptying your bladder? Not at All     How often have you had to urinate less than every two hours? Less than 1 in 5 times     How often have you found you stopped and started again several times when you urinated? Not at All     How often have you found it difficult to postpone urination? Less than 1 in 5 times     How often have you had a weak urinary stream? Not at All     How often have you had to strain to start urination? Not at All     How many times did you typically get up at night to urinate? 3 Times     Total IPSS Score 5       Quality of Life due to urinary symptoms   If you were to spend the rest of your life with your urinary condition just the way it is now how would you feel about that? Pleased            ROS:  ROS:  A complete review of systems was performed.  All systems are negative except for pertinent findings as noted.   ROS  No Known Allergies  Outpatient Encounter Medications as of 08/25/2019  Medication Sig  . ascorbic acid (VITAMIN C) 500 MG tablet Take 500 mg by mouth 2 (two) times daily.  .Marland Kitchenaspirin EC 81 MG tablet Take 81 mg by mouth daily.  .Marland Kitchenatorvastatin (LIPITOR) 80 MG tablet Take 1 tablet (80 mg total) by mouth at bedtime.  .Raelyn Ensign Pollen 550 MG CAPS Take 1 capsule by mouth 2 (two) times daily.   . blood glucose meter kit and supplies KIT Test daily.  Dx e11.22  . carvedilol (COREG) 6.25 MG tablet Take 1 tablet (6.25 mg total) by mouth 2 (two) times daily.  . Cholecalciferol (VITAMIN D3) 125 MCG (5000 UT) TABS Take 1 tablet by mouth daily.   . CHROMIUM-CINNAMON PO Take 2,000 mg by mouth in the morning and at bedtime.  .Marland Kitchenezetimibe (ZETIA) 10 MG tablet TAKE 1 TABLET BY MOUTH EVERY DAY  . FIBER ADULT GUMMIES PO Take 2 tablets by mouth at bedtime.  . finasteride (PROSCAR) 5 MG tablet Take 1 tablet (5 mg total) by mouth at bedtime.  .Marland Kitchenglimepiride (AMARYL) 4 MG tablet Take 1 tablet (4 mg total) by mouth in the morning and at bedtime.  .Marland Kitchenglucose blood (COOL BLOOD GLUCOSE TEST STRIPS) test strip One Touch Ultra test 1 time a day  . losartan (COZAAR) 25 MG tablet Take 1 tablet (25 mg total) by  mouth 2 (two) times daily.  . nitroGLYCERIN (NITROSTAT) 0.4 MG SL tablet Place 1 tablet (0.4 mg total) under the tongue every 5 (five) minutes x 3 doses as needed for chest pain.  . Omega-3 Fatty Acids (FISH OIL) 1200 MG CAPS Take 1 capsule by mouth 2 (two) times daily.   Marland Kitchen oxybutynin (DITROPAN) 5 MG tablet Take 0.5 tablets (2.5 mg total) by mouth 3 (three) times daily.  . tamsulosin (FLOMAX) 0.4 MG CAPS capsule Take 1 capsule (0.4 mg total) by mouth every morning.  . vitamin B-12 (CYANOCOBALAMIN) 1000 MCG tablet Take 1,000 mcg by mouth every morning.   . [DISCONTINUED] finasteride (PROSCAR) 5 MG tablet Take 1 tablet (5 mg total) by mouth at bedtime.  . [DISCONTINUED] oxybutynin (DITROPAN) 5 MG tablet Take 2.5 mg by mouth 3 (three) times daily.  . [DISCONTINUED] tamsulosin (FLOMAX) 0.4 MG CAPS capsule Take 0.4 mg by mouth every morning.  . [DISCONTINUED] carvedilol (COREG) 6.25 MG tablet TAKE 1 TABLET BY MOUTH TWICE A DAY  . [DISCONTINUED] losartan (COZAAR) 100 MG tablet Take 0.5 tablets (50 mg total) by mouth 2 (two) times daily.   No  facility-administered encounter medications on file as of 08/25/2019.    Past Medical History:  Diagnosis Date  . Acid reflux   . Blood transfusion without reported diagnosis   . Cardiac arrest ([place])    a. occuring in 12/2016. LAD disease but no targets for CABG or PCI. Underwent ICD placement as EF 15-20%.   . CHF (congestive heart failure) ([place])   . Coronary artery disease   . Diabetes mellitus without complication ([place])   . Hyperlipidemia   . Hypertension   . Myocardial infarction ([place])   . Renal disorder     Past Surgical History:  Procedure Laterality Date  . BIV ICD INSERTION CRT-D N/A 01/01/2017   Procedure: BIV ICD INSERTION CRT-D;  Surgeon: Constance Haw, MD;  Location: [facility];  Service: Cardiovascular;  Laterality: N/A;    Social History   Socioeconomic History  . Marital status: Married    Spouse name: [name]  . Number of children: 3  . Years of education: 49  . Highest education level: Some college, no degree  Occupational History  . Occupation: Radiographer, therapeutic   retired  . Occupation: pastor  Tobacco Use  . Smoking status: Former Smoker    Types: Cigarettes    Quit date: 1960    Years since quitting: 61.6  . Smokeless tobacco: Never Used  . Tobacco comment: 2-3 years as a teenager  Vaping Use  . Vaping Use: Never used  Substance and Sexual Activity  . Alcohol use: No  . Drug use: No  . Sexual activity: Never  Other Topics Concern  . Not on file  Social History Narrative   Lives with with wife [name]   Married 15 years   Daughter [name] stays frequently   Granddaughter Jacqlyn Larsen near by   [employer] of [employer] broadcast assistant Strain:   . Difficulty of Paying Living Expenses:   Food Insecurity:   . Worried About Charity fundraiser in the Last Year:   . Arboriculturist in the Last Year:   Transportation Needs:   . Film/video editor (Medical):   Marland Kitchen Lack of Transportation (Non-Medical):   Physical Activity:    . Days of Exercise per Week:   . Minutes of Exercise per Session:   Stress:   . Feeling of Stress :  Social Connections:   . Frequency of Communication with Friends and Family:   . Frequency of Social Gatherings with Friends and Family:   . Attends Religious Services:   . Active Member of Clubs or Organizations:   . Attends Archivist Meetings:   Marland Kitchen Marital Status:   Intimate Partner Violence:   . Fear of Current or Ex-Partner:   . Emotionally Abused:   Marland Kitchen Physically Abused:   . Sexually Abused:     Family History  Problem Relation Age of Onset  . Hypertension Mother   . Hyperlipidemia Mother   . CAD Sister   . Diabetes Sister   . Hyperlipidemia Sister   . Hypertension Sister   . CAD Brother   . Diabetes Brother   . Hypertension Brother   . Hyperlipidemia Brother   . CAD Brother   . Diabetes Brother   . Hypertension Brother   . Hyperlipidemia Brother   . Dementia Brother   . Thyroid disease Daughter   . Hypertension Daughter   . Diabetes Daughter   . Hyperlipidemia Daughter   . Fibromyalgia Daughter   . Heart disease Daughter        stents  . Multiple sclerosis Daughter   . Thyroid disease Daughter        Objective: Vitals:   08/25/19 1402  BP: (!) 102/64  Pulse: 60  Temp: 97.7 F (36.5 C)     Physical Exam  Lab Results:  Results for orders placed or performed in visit on 08/25/19 (from the past 24 hour(s))  POCT urinalysis dipstick     Status: Abnormal   Collection Time: 08/25/19  2:08 PM  Result Value Ref Range   Color, UA yellow    Clarity, UA clear    Glucose, UA Negative Negative   Bilirubin, UA neg    Ketones, UA neg    Spec Grav, UA >=1.030 (A) 1.010 - 1.025   Blood, UA neg    pH, UA 5.0 5.0 - 8.0   Protein, UA Positive (A) Negative   Urobilinogen, UA negative (A) 0.2 or 1.0 E.U./dL   Nitrite, UA neg    Leukocytes, UA Negative Negative   Appearance     Odor      BMET No results for input(s): NA, K, CL, CO2, GLUCOSE,  BUN, CREATININE, CALCIUM in the last 72 hours. PSA No results found for: PSA No results found for: TESTOSTERONE    Studies/Results: No results found.    Assessment & Plan: BPH with obstruction/lower urinary tract symptoms He has stable LUTS on finasteride, tamsulosin and oxybutybnin.  Meds refilled.  F/U in a year.   History of nephrolithiasis He has had no flank pain or hematuria.   I will reimage if he has symptoms.     Meds ordered this encounter  Medications  . finasteride (PROSCAR) 5 MG tablet    Sig: Take 1 tablet (5 mg total) by mouth at bedtime.    Dispense:  90 tablet    Refill:  3  . oxybutynin (DITROPAN) 5 MG tablet    Sig: Take 0.5 tablets (2.5 mg total) by mouth 3 (three) times daily.    Dispense:  135 tablet    Refill:  3  . tamsulosin (FLOMAX) 0.4 MG CAPS capsule    Sig: Take 1 capsule (0.4 mg total) by mouth every morning.    Dispense:  90 capsule    Refill:  3     Orders Placed This Encounter  Procedures  .  POCT urinalysis dipstick      Return in about 1 year (around 08/24/2020).   CC: Doree Albee, MD      Irine Seal 08/25/2019

## 2019-08-25 ENCOUNTER — Ambulatory Visit (INDEPENDENT_AMBULATORY_CARE_PROVIDER_SITE_OTHER): Payer: Medicare Other | Admitting: Urology

## 2019-08-25 ENCOUNTER — Other Ambulatory Visit: Payer: Self-pay

## 2019-08-25 ENCOUNTER — Encounter: Payer: Self-pay | Admitting: Urology

## 2019-08-25 VITALS — BP 102/64 | HR 60 | Temp 97.7°F | Ht 67.5 in | Wt 181.0 lb

## 2019-08-25 DIAGNOSIS — I255 Ischemic cardiomyopathy: Secondary | ICD-10-CM | POA: Diagnosis not present

## 2019-08-25 DIAGNOSIS — Z87442 Personal history of urinary calculi: Secondary | ICD-10-CM

## 2019-08-25 DIAGNOSIS — N401 Enlarged prostate with lower urinary tract symptoms: Secondary | ICD-10-CM | POA: Diagnosis not present

## 2019-08-25 DIAGNOSIS — R351 Nocturia: Secondary | ICD-10-CM | POA: Insufficient documentation

## 2019-08-25 DIAGNOSIS — N138 Other obstructive and reflux uropathy: Secondary | ICD-10-CM | POA: Diagnosis not present

## 2019-08-25 LAB — POCT URINALYSIS DIPSTICK
Bilirubin, UA: NEGATIVE
Blood, UA: NEGATIVE
Glucose, UA: NEGATIVE
Ketones, UA: NEGATIVE
Leukocytes, UA: NEGATIVE
Nitrite, UA: NEGATIVE
Protein, UA: POSITIVE — AB
Spec Grav, UA: >=1.03 — AB (ref 1.010–1.025)
Urobilinogen, UA: NEGATIVE E.U./dL — AB
pH, UA: 5 (ref 5.0–8.0)

## 2019-08-25 MED ORDER — FINASTERIDE 5 MG PO TABS: 5.0000 mg | ORAL_TABLET | Freq: Every day | ORAL | 3 refills | Status: DC

## 2019-08-25 MED ORDER — OXYBUTYNIN CHLORIDE 5 MG PO TABS: 2.5000 mg | ORAL_TABLET | Freq: Three times a day (TID) | ORAL | 3 refills | Status: DC

## 2019-08-25 MED ORDER — TAMSULOSIN HCL 0.4 MG PO CAPS: 0.4000 mg | ORAL_CAPSULE | ORAL | 3 refills | Status: DC

## 2019-08-25 NOTE — Progress Notes (Signed)

## 2019-08-25 NOTE — Assessment & Plan Note (Signed)
He has stable LUTS on finasteride, tamsulosin and oxybutybnin.  Meds refilled.  F/U in a year.

## 2019-08-25 NOTE — Assessment & Plan Note (Signed)
He has had no flank pain or hematuria.   I will reimage if he has symptoms.

## 2019-09-04 ENCOUNTER — Ambulatory Visit (INDEPENDENT_AMBULATORY_CARE_PROVIDER_SITE_OTHER): Payer: Medicare Other

## 2019-09-04 DIAGNOSIS — Z95810 Presence of automatic (implantable) cardiac defibrillator: Secondary | ICD-10-CM

## 2019-09-04 DIAGNOSIS — I5022 Chronic systolic (congestive) heart failure: Secondary | ICD-10-CM

## 2019-09-06 NOTE — Progress Notes (Signed)
EPIC Encounter for ICM Monitoring  Patient Name: Augie Vane is a 84 y.o. male Date: 09/06/2019 Primary Care Physican: Wilson Singer, MD Primary Cardiologist: Purvis Sheffield Electrophysiologist:Allred Nephrologist:[facility] Bi-V Pacing:99.7% 6/29/2021Weight:181lbs  Spoke with patient and reports feeling well at this time. Denies fluid symptoms.  OptivolThoracic impedancesuggestingnormal.  Prescribed:No diuretic  Labs: 06/16/2019 Creatinine 1.56, BUN 23, Potassium 5.5, Sodium 143, GFR 41-47 Care Everywhere  Recommendations:No changes and encouraged to call if experiencing any fluid symptoms.  Follow-up plan: ICM clinic phone appointment on9/11/2019. 91 day device clinic remote transmission9/09/2019.    EP/Cardiology Office Visits: 01/03/2020 with Dr. Wyline Mood (replacing Dr Purvis Sheffield).    Copy of ICM check sent to Dr. Johney Frame.   3 month ICM trend: 09/04/2019    1 Year ICM trend:       Karie Soda, RN 09/06/2019 11:53 AM

## 2019-09-22 DIAGNOSIS — R809 Proteinuria, unspecified: Secondary | ICD-10-CM | POA: Diagnosis not present

## 2019-09-22 DIAGNOSIS — E875 Hyperkalemia: Secondary | ICD-10-CM | POA: Diagnosis not present

## 2019-09-22 DIAGNOSIS — I129 Hypertensive chronic kidney disease with stage 1 through stage 4 chronic kidney disease, or unspecified chronic kidney disease: Secondary | ICD-10-CM | POA: Diagnosis not present

## 2019-09-22 DIAGNOSIS — E1122 Type 2 diabetes mellitus with diabetic chronic kidney disease: Secondary | ICD-10-CM | POA: Diagnosis not present

## 2019-09-22 DIAGNOSIS — E1129 Type 2 diabetes mellitus with other diabetic kidney complication: Secondary | ICD-10-CM | POA: Diagnosis not present

## 2019-09-22 DIAGNOSIS — N189 Chronic kidney disease, unspecified: Secondary | ICD-10-CM | POA: Diagnosis not present

## 2019-09-29 DIAGNOSIS — E1122 Type 2 diabetes mellitus with diabetic chronic kidney disease: Secondary | ICD-10-CM | POA: Diagnosis not present

## 2019-09-29 DIAGNOSIS — R809 Proteinuria, unspecified: Secondary | ICD-10-CM | POA: Diagnosis not present

## 2019-09-29 DIAGNOSIS — N189 Chronic kidney disease, unspecified: Secondary | ICD-10-CM | POA: Diagnosis not present

## 2019-09-29 DIAGNOSIS — E875 Hyperkalemia: Secondary | ICD-10-CM | POA: Diagnosis not present

## 2019-09-29 DIAGNOSIS — E1129 Type 2 diabetes mellitus with other diabetic kidney complication: Secondary | ICD-10-CM | POA: Diagnosis not present

## 2019-09-29 DIAGNOSIS — I129 Hypertensive chronic kidney disease with stage 1 through stage 4 chronic kidney disease, or unspecified chronic kidney disease: Secondary | ICD-10-CM | POA: Diagnosis not present

## 2019-10-02 DIAGNOSIS — Z23 Encounter for immunization: Secondary | ICD-10-CM | POA: Diagnosis not present

## 2019-10-11 ENCOUNTER — Ambulatory Visit (INDEPENDENT_AMBULATORY_CARE_PROVIDER_SITE_OTHER): Payer: Medicare Other | Admitting: *Deleted

## 2019-10-11 DIAGNOSIS — I255 Ischemic cardiomyopathy: Secondary | ICD-10-CM

## 2019-10-11 LAB — CUP PACEART REMOTE DEVICE CHECK
Battery Remaining Longevity: 64 mo
Battery Voltage: 2.98 V
Brady Statistic AP VP Percent: 65.74 %
Brady Statistic AP VS Percent: 0.02 %
Brady Statistic AS VP Percent: 34.09 %
Brady Statistic AS VS Percent: 0.16 %
Brady Statistic RA Percent Paced: 65.75 %
Brady Statistic RV Percent Paced: 98.74 %
Date Time Interrogation Session: 20210908033623
HighPow Impedance: 75 Ohm
Implantable Lead Implant Date: 20181130
Implantable Lead Implant Date: 20181130
Implantable Lead Implant Date: 20181130
Implantable Lead Location: 753858
Implantable Lead Location: 753859
Implantable Lead Location: 753860
Implantable Lead Model: 4598
Implantable Lead Model: 5076
Implantable Lead Model: 6935
Implantable Pulse Generator Implant Date: 20181130
Lead Channel Impedance Value: 128.074
Lead Channel Impedance Value: 136.276
Lead Channel Impedance Value: 139.967
Lead Channel Impedance Value: 145.871
Lead Channel Impedance Value: 156.606
Lead Channel Impedance Value: 247 Ohm
Lead Channel Impedance Value: 266 Ohm
Lead Channel Impedance Value: 304 Ohm
Lead Channel Impedance Value: 323 Ohm
Lead Channel Impedance Value: 342 Ohm
Lead Channel Impedance Value: 380 Ohm
Lead Channel Impedance Value: 456 Ohm
Lead Channel Impedance Value: 494 Ohm
Lead Channel Impedance Value: 494 Ohm
Lead Channel Impedance Value: 513 Ohm
Lead Channel Impedance Value: 532 Ohm
Lead Channel Impedance Value: 570 Ohm
Lead Channel Impedance Value: 646 Ohm
Lead Channel Pacing Threshold Amplitude: 0.375 V
Lead Channel Pacing Threshold Amplitude: 0.875 V
Lead Channel Pacing Threshold Amplitude: 0.875 V
Lead Channel Pacing Threshold Pulse Width: 0.4 ms
Lead Channel Pacing Threshold Pulse Width: 0.4 ms
Lead Channel Pacing Threshold Pulse Width: 0.4 ms
Lead Channel Sensing Intrinsic Amplitude: 10.125 mV
Lead Channel Sensing Intrinsic Amplitude: 10.125 mV
Lead Channel Sensing Intrinsic Amplitude: 2.375 mV
Lead Channel Sensing Intrinsic Amplitude: 2.375 mV
Lead Channel Setting Pacing Amplitude: 1.5 V
Lead Channel Setting Pacing Amplitude: 2 V
Lead Channel Setting Pacing Amplitude: 2.5 V
Lead Channel Setting Pacing Pulse Width: 0.4 ms
Lead Channel Setting Pacing Pulse Width: 0.4 ms
Lead Channel Setting Sensing Sensitivity: 0.3 mV

## 2019-10-12 NOTE — Progress Notes (Signed)
Remote ICD transmission.   

## 2019-10-13 ENCOUNTER — Ambulatory Visit (INDEPENDENT_AMBULATORY_CARE_PROVIDER_SITE_OTHER): Payer: Medicare Other

## 2019-10-13 DIAGNOSIS — Z95810 Presence of automatic (implantable) cardiac defibrillator: Secondary | ICD-10-CM | POA: Diagnosis not present

## 2019-10-13 DIAGNOSIS — I5022 Chronic systolic (congestive) heart failure: Secondary | ICD-10-CM

## 2019-10-13 NOTE — Progress Notes (Signed)
Spoke with patient and reports feeling well at this time.  Denies fluid symptoms. Current weight 181 lbs. Transmission reviewed.  No changes and encouraged to call if experiencing any fluid symptoms.

## 2019-10-13 NOTE — Progress Notes (Signed)
EPIC Encounter for ICM Monitoring  Patient Name: Omar Williams is a 84 y.o. male Date: 10/13/2019 Primary Care Physican: Wilson Singer, MD Primary Cardiologist: Purvis Sheffield Electrophysiologist:Allred Nephrologist:[facility] Bi-V Pacing:99.8% 6/29/2021Weight:181lbs  Attempted call to patient and unable to reach.  Left message to return call. Transmission reviewed.   OptivolThoracic impedancesuggestingnormal fluid levels.  Prescribed:No diuretic  Labs: 06/16/2019 Creatinine 1.56, BUN 23, Potassium 5.5, Sodium 143, GFR 41-47 Care Everywhere  Recommendations: Unable to reach.    Follow-up plan: ICM clinic phone appointment on10/12/2019. 91 day device clinic remote transmission12/09/2019.    EP/Cardiology Office Visits: 01/03/2020 with Dr. Wyline Mood (replacing Dr Purvis Sheffield).    Copy of ICM check sent to Dr. Johney Frame.   3 month ICM trend: 10/11/2019    1 Year ICM trend:       Karie Soda, RN 10/13/2019 1:22 PM

## 2019-11-08 ENCOUNTER — Ambulatory Visit (INDEPENDENT_AMBULATORY_CARE_PROVIDER_SITE_OTHER): Payer: Medicare Other | Admitting: Internal Medicine

## 2019-11-08 ENCOUNTER — Other Ambulatory Visit: Payer: Self-pay

## 2019-11-08 ENCOUNTER — Encounter (INDEPENDENT_AMBULATORY_CARE_PROVIDER_SITE_OTHER): Payer: Self-pay | Admitting: Internal Medicine

## 2019-11-08 VITALS — BP 124/70 | HR 69 | Temp 97.5°F | Resp 18 | Ht 67.0 in | Wt 178.0 lb

## 2019-11-08 DIAGNOSIS — I10 Essential (primary) hypertension: Secondary | ICD-10-CM | POA: Diagnosis not present

## 2019-11-08 DIAGNOSIS — N183 Chronic kidney disease, stage 3 unspecified: Secondary | ICD-10-CM | POA: Diagnosis not present

## 2019-11-08 DIAGNOSIS — I255 Ischemic cardiomyopathy: Secondary | ICD-10-CM | POA: Diagnosis not present

## 2019-11-08 DIAGNOSIS — E1122 Type 2 diabetes mellitus with diabetic chronic kidney disease: Secondary | ICD-10-CM | POA: Diagnosis not present

## 2019-11-08 DIAGNOSIS — E559 Vitamin D deficiency, unspecified: Secondary | ICD-10-CM | POA: Diagnosis not present

## 2019-11-08 NOTE — Progress Notes (Signed)
Metrics: Intervention Frequency ACO  Documented Smoking Status Yearly  Screened one or more times in 24 months  Cessation Counseling or  Active cessation medication Past 24 months  Past 24 months   Guideline developer: UpToDate (See UpToDate for funding source) Date Released: 2014       Wellness Office Visit  Subjective:  Patient ID: Omar Williams, male    DOB: [birth year]  Age: 84 y.o. MRN: [id number]  CC: This man comes in for follow-up of diabetes, hypertension and vitamin D deficiency. HPI  He also has a history of ICD for severe congestive heart failure.  He sees cardiology on a regular basis for this. He continues on Amaryl for his diabetes.  His last hemoglobin A1c was below 8% and for his age I think this is reasonable control. He continues on carvedilol for cardiac effects and hypertension. He continues on vitamin D3 supplementation for vitamin D deficiency. He feels well.  He denies any chest pain, dyspnea, palpitations or limb weakness. He does have chronic kidney disease and follows with nephrology. Past Medical History:  Diagnosis Date  . Acid reflux   . Blood transfusion without reported diagnosis   . Cardiac arrest ([place])    a. occuring in 12/2016. LAD disease but no targets for CABG or PCI. Underwent ICD placement as EF 15-20%.   . CHF (congestive heart failure) ([place])   . Coronary artery disease   . Diabetes mellitus without complication ([place])   . Hyperlipidemia   . Hypertension   . Myocardial infarction ([place])   . Renal disorder    Past Surgical History:  Procedure Laterality Date  . BIV ICD INSERTION CRT-D N/A 01/01/2017   Procedure: BIV ICD INSERTION CRT-D;  Surgeon: Constance Haw, MD;  Location: [facility];  Service: Cardiovascular;  Laterality: N/A;     Family History  Problem Relation Age of Onset  . Hypertension Mother   . Hyperlipidemia Mother   . CAD Sister   . Diabetes Sister   . Hyperlipidemia Sister   . Hypertension Sister   .  CAD Brother   . Diabetes Brother   . Hypertension Brother   . Hyperlipidemia Brother   . CAD Brother   . Diabetes Brother   . Hypertension Brother   . Hyperlipidemia Brother   . Dementia Brother   . Thyroid disease Daughter   . Hypertension Daughter   . Diabetes Daughter   . Hyperlipidemia Daughter   . Fibromyalgia Daughter   . Heart disease Daughter        stents  . Multiple sclerosis Daughter   . Thyroid disease Daughter     Social History   Social History Narrative   Lives with with wife [name]   Married 65 years   Daughter [name] stays frequently   Granddaughter [name] near by   Social History   Tobacco Use  . Smoking status: Former Smoker    Types: Cigarettes    Quit date: 1960    Years since quitting: 61.8  . Smokeless tobacco: Never Used  . Tobacco comment: 2-3 years as a teenager  Substance Use Topics  . Alcohol use: No    Current Meds  Medication Sig  . ascorbic acid (VITAMIN C) 500 MG tablet Take 500 mg by mouth 2 (two) times daily.  Marland Kitchen aspirin EC 81 MG tablet Take 81 mg by mouth daily.  Marland Kitchen atorvastatin (LIPITOR) 80 MG tablet Take 1 tablet (80 mg total) by mouth at bedtime.  Marland Kitchen Bee  Pollen 550 MG CAPS Take 1 capsule by mouth 2 (two) times daily.   . blood glucose meter kit and supplies KIT Test daily.  Dx e11.22  . carvedilol (COREG) 6.25 MG tablet Take 1 tablet (6.25 mg total) by mouth 2 (two) times daily.  . Cholecalciferol (VITAMIN D3) 125 MCG (5000 UT) TABS Take 1 tablet by mouth daily.   . CHROMIUM-CINNAMON PO Take 2,000 mg by mouth in the morning and at bedtime.  Marland Kitchen ezetimibe (ZETIA) 10 MG tablet TAKE 1 TABLET BY MOUTH EVERY DAY  . FIBER ADULT GUMMIES PO Take 2 tablets by mouth at bedtime.  . finasteride (PROSCAR) 5 MG tablet Take 1 tablet (5 mg total) by mouth at bedtime.  Marland Kitchen glimepiride (AMARYL) 4 MG tablet Take 1 tablet (4 mg total) by mouth in the morning and at bedtime.  Marland Kitchen glucose blood (COOL BLOOD GLUCOSE TEST STRIPS) test strip One Touch  Ultra test 1 time a day  . losartan (COZAAR) 25 MG tablet Take 1 tablet (25 mg total) by mouth 2 (two) times daily.  . nitroGLYCERIN (NITROSTAT) 0.4 MG SL tablet Place 1 tablet (0.4 mg total) under the tongue every 5 (five) minutes x 3 doses as needed for chest pain.  . Omega-3 Fatty Acids (FISH OIL) 1200 MG CAPS Take 1 capsule by mouth 2 (two) times daily.   Marland Kitchen oxybutynin (DITROPAN) 5 MG tablet Take 0.5 tablets (2.5 mg total) by mouth 3 (three) times daily.  . tamsulosin (FLOMAX) 0.4 MG CAPS capsule Take 1 capsule (0.4 mg total) by mouth every morning.  . vitamin B-12 (CYANOCOBALAMIN) 1000 MCG tablet Take 1,000 mcg by mouth every morning.       Depression screen [facility] 2/9 06/02/2019 04/17/2019 10/06/2018 08/26/2017 05/25/2017  Decreased Interest 0 0 0 0 0  Down, Depressed, Hopeless 0 0 0 0 0  PHQ - 2 Score 0 0 0 0 0  Altered sleeping - - 0 - -  Tired, decreased energy - - 0 - -  Change in appetite - - 0 - -  Feeling bad or failure about yourself  - - 0 - -  Trouble concentrating - - 0 - -  Moving slowly or fidgety/restless - - 0 - -  Suicidal thoughts - - 0 - -  PHQ-9 Score - - 0 - -  Difficult doing work/chores - - Not difficult at all - -     Objective:   Today's Vitals: BP 124/70 (BP Location: Right Arm, Patient Position: Sitting, Cuff Size: Normal)   Pulse 69   Temp (!) 97.5 F (36.4 C) (Temporal)   Resp 18   Ht _0  (1.702 m)   Wt 178 lb (80.7 kg)   SpO2 98%   BMI 27.88 kg/m  Vitals with BMI 11/08/2019 08/25/2019 08/08/2019  Height _1  5' 7.5" _2   Weight 178 lbs 181 lbs 181 lbs 13 oz  BMI 27.87 44.01 02.72  Systolic 536 644 034  Diastolic 70 64 65  Pulse 69 60 70     Physical Exam   He looks systemically well.  His weight is stable.  Blood pressure is excellent.  No new physical findings.    Assessment   1. Vitamin D deficiency   2. Type 2 diabetes mellitus with stage 3 chronic kidney disease, without long-term current use of insulin, unspecified whether stage  3a or 3b CKD ([place])   3. Essential hypertension       Tests ordered No orders of the defined  types were placed in this encounter.    Plan: 1. He will continue with Amaryl for his diabetes for the time being.  I do not think we need to check an A1c on this occasion but we will on the next visit. 2. He will continue with vitamin D3 supplementation for vitamin D deficiency. 3. He will continue with antihypertensive therapy listed above.  His blood pressure is very stable.  His congestive heart failure is also very stable. 4. Follow-up in 3 months with Judson Roch.  I have urged him and encouraged him to get COVID-19 vaccination.   No orders of the defined types were placed in this encounter.   Doree Albee, MD

## 2019-11-13 ENCOUNTER — Ambulatory Visit (INDEPENDENT_AMBULATORY_CARE_PROVIDER_SITE_OTHER): Payer: Medicare Other

## 2019-11-13 DIAGNOSIS — Z95810 Presence of automatic (implantable) cardiac defibrillator: Secondary | ICD-10-CM

## 2019-11-13 DIAGNOSIS — I5022 Chronic systolic (congestive) heart failure: Secondary | ICD-10-CM

## 2019-11-14 NOTE — Progress Notes (Signed)
EPIC Encounter for ICM Monitoring  Patient Name: Omar Williams is a 84 y.o. male Date: 11/14/2019 Primary Care Physican: Wilson Singer, MD Primary Cardiologist: Purvis Sheffield Electrophysiologist:Allred Nephrologist:[facility] Bi-V Pacing:99.8% 10/12/2021Weight:178lbs  Spoke with patient and reports feeling well at this time.  Denies fluid symptoms.    OptivolThoracic impedancesuggestingnormal fluid levels.  Prescribed:No diuretic  Labs: 08/08/2019 Creatinine 1.38, BUN 25, Potassium 4.5, Sodium 141 06/16/2019 Creatinine 1.56, BUN 23, Potassium 5.5, Sodium 143, GFR 41-47 Care Everywhere  Recommendations: No changes and encouraged to call if experiencing any fluid symptoms.  Follow-up plan: ICM clinic phone appointment on11/16/2021. 91 day device clinic remote transmission12/09/2019.   EP/Cardiology Office Visits:01/03/2020 with Dr.Branch (replacing Dr Purvis Sheffield).   Copy of ICM check sent to Dr.Allred.    3 month ICM trend: 11/13/2019    1 Year ICM trend:       Karie Soda, RN 11/14/2019 10:46 AM

## 2019-11-22 ENCOUNTER — Telehealth (INDEPENDENT_AMBULATORY_CARE_PROVIDER_SITE_OTHER): Payer: Self-pay | Admitting: Internal Medicine

## 2019-11-22 ENCOUNTER — Other Ambulatory Visit (INDEPENDENT_AMBULATORY_CARE_PROVIDER_SITE_OTHER): Payer: Self-pay | Admitting: Internal Medicine

## 2019-11-22 DIAGNOSIS — E1122 Type 2 diabetes mellitus with diabetic chronic kidney disease: Secondary | ICD-10-CM

## 2019-11-22 DIAGNOSIS — N183 Chronic kidney disease, stage 3 unspecified: Secondary | ICD-10-CM

## 2019-11-22 MED ORDER — GLIMEPIRIDE 4 MG PO TABS: 4.0000 mg | ORAL_TABLET | Freq: Two times a day (BID) | ORAL | 0 refills | Status: DC

## 2019-11-22 NOTE — Telephone Encounter (Signed)
Okay, done

## 2019-12-07 ENCOUNTER — Other Ambulatory Visit (INDEPENDENT_AMBULATORY_CARE_PROVIDER_SITE_OTHER): Payer: Self-pay | Admitting: Internal Medicine

## 2019-12-07 ENCOUNTER — Telehealth (INDEPENDENT_AMBULATORY_CARE_PROVIDER_SITE_OTHER): Payer: Self-pay | Admitting: Internal Medicine

## 2019-12-07 DIAGNOSIS — N390 Urinary tract infection, site not specified: Secondary | ICD-10-CM

## 2019-12-07 NOTE — Telephone Encounter (Signed)
Okay, I will put the order in for urinalysis and culture.  We will not get the results on Monday.

## 2019-12-10 LAB — URINALYSIS W MICROSCOPIC + REFLEX CULTURE
Bacteria, UA: NONE SEEN /HPF
Bilirubin Urine: NEGATIVE
Glucose, UA: NEGATIVE
Hgb urine dipstick: NEGATIVE
Hyaline Cast: NONE SEEN /LPF
Ketones, ur: NEGATIVE
Nitrites, Initial: NEGATIVE
RBC / HPF: NONE SEEN /HPF (ref 0–2)
Specific Gravity, Urine: 1.016 (ref 1.001–1.03)
Squamous Epithelial / HPF: NONE SEEN /HPF (ref <=5)
pH: <=5 (ref 5.0–8.0)

## 2019-12-10 LAB — URINE CULTURE

## 2019-12-10 LAB — CULTURE INDICATED

## 2019-12-11 ENCOUNTER — Other Ambulatory Visit (INDEPENDENT_AMBULATORY_CARE_PROVIDER_SITE_OTHER): Payer: Self-pay | Admitting: Internal Medicine

## 2019-12-11 MED ORDER — CIPROFLOXACIN HCL 500 MG PO TABS
500.0000 mg | ORAL_TABLET | Freq: Two times a day (BID) | ORAL | 0 refills | Status: DC
Start: 2019-12-11 — End: 2020-01-03

## 2019-12-11 NOTE — Progress Notes (Signed)
Patient called.this patient today and let him know that he does have a UTI and I have sent the prescription [facility] [place]. He has to complete the whole course and tell him to make an appointment to see me in 2 to 3 weeks time. Thanks.

## 2019-12-11 NOTE — Progress Notes (Signed)
Please call this patient today and let him know that he does have a UTI and I have sent the prescription [facility] [place]. He has to complete the whole course and tell him to make an appointment to see me in 2 to 3 weeks time. Thanks.

## 2019-12-12 ENCOUNTER — Other Ambulatory Visit: Payer: Self-pay | Admitting: *Deleted

## 2019-12-12 MED ORDER — LOSARTAN POTASSIUM 25 MG PO TABS: 25.0000 mg | ORAL_TABLET | Freq: Two times a day (BID) | ORAL | 1 refills | Status: DC

## 2019-12-14 DIAGNOSIS — E875 Hyperkalemia: Secondary | ICD-10-CM | POA: Diagnosis not present

## 2019-12-14 DIAGNOSIS — R809 Proteinuria, unspecified: Secondary | ICD-10-CM | POA: Diagnosis not present

## 2019-12-14 DIAGNOSIS — E1129 Type 2 diabetes mellitus with other diabetic kidney complication: Secondary | ICD-10-CM | POA: Diagnosis not present

## 2019-12-14 DIAGNOSIS — E1122 Type 2 diabetes mellitus with diabetic chronic kidney disease: Secondary | ICD-10-CM | POA: Diagnosis not present

## 2019-12-14 DIAGNOSIS — N189 Chronic kidney disease, unspecified: Secondary | ICD-10-CM | POA: Diagnosis not present

## 2019-12-14 DIAGNOSIS — I129 Hypertensive chronic kidney disease with stage 1 through stage 4 chronic kidney disease, or unspecified chronic kidney disease: Secondary | ICD-10-CM | POA: Diagnosis not present

## 2019-12-17 ENCOUNTER — Other Ambulatory Visit (INDEPENDENT_AMBULATORY_CARE_PROVIDER_SITE_OTHER): Payer: Self-pay | Admitting: Nurse Practitioner

## 2019-12-17 DIAGNOSIS — E559 Vitamin D deficiency, unspecified: Secondary | ICD-10-CM

## 2019-12-17 DIAGNOSIS — I10 Essential (primary) hypertension: Secondary | ICD-10-CM

## 2019-12-19 ENCOUNTER — Ambulatory Visit (INDEPENDENT_AMBULATORY_CARE_PROVIDER_SITE_OTHER): Payer: Medicare Other

## 2019-12-19 DIAGNOSIS — Z95810 Presence of automatic (implantable) cardiac defibrillator: Secondary | ICD-10-CM | POA: Diagnosis not present

## 2019-12-19 DIAGNOSIS — I5022 Chronic systolic (congestive) heart failure: Secondary | ICD-10-CM | POA: Diagnosis not present

## 2019-12-20 NOTE — Progress Notes (Signed)
EPIC Encounter for ICM Monitoring  Patient Name: Omar Williams is a 84 y.o. male Date: 12/20/2019 Primary Care Physican: Wilson Singer, MD Primary Cardiologist: Wyline Mood Electrophysiologist:Allred Nephrologist:[facility] Bi-V Pacing:99.8% 10/12/2021Weight:178lbs  Spoke with wife and reports feeling well at this time.  Denies fluid symptoms.    OptivolThoracic impedancesuggestingnormalfluid levels.  Prescribed:No diuretic  Labs: 08/08/2019 Creatinine 1.38, BUN 25, Potassium 4.5, Sodium 141 06/16/2019 Creatinine 1.56, BUN 23, Potassium 5.5, Sodium 143, GFR 41-47 Care Everywhere  Recommendations:No changes and encouraged to call if experiencing any fluid symptoms.  Follow-up plan: ICM clinic phone appointment on12/20/2021. 91 day device clinic remote transmission12/09/2019.   EP/Cardiology Office Visits:01/03/2020 with Dr.Branch.   Copy of ICM check sent to Dr.Allred.    3 month ICM trend: 12/19/2019    1 Year ICM trend:       Karie Soda, RN 12/20/2019 11:14 AM

## 2019-12-21 DIAGNOSIS — I129 Hypertensive chronic kidney disease with stage 1 through stage 4 chronic kidney disease, or unspecified chronic kidney disease: Secondary | ICD-10-CM | POA: Diagnosis not present

## 2019-12-21 DIAGNOSIS — R809 Proteinuria, unspecified: Secondary | ICD-10-CM | POA: Diagnosis not present

## 2019-12-21 DIAGNOSIS — N189 Chronic kidney disease, unspecified: Secondary | ICD-10-CM | POA: Diagnosis not present

## 2019-12-21 DIAGNOSIS — E875 Hyperkalemia: Secondary | ICD-10-CM | POA: Diagnosis not present

## 2019-12-21 DIAGNOSIS — E1122 Type 2 diabetes mellitus with diabetic chronic kidney disease: Secondary | ICD-10-CM | POA: Diagnosis not present

## 2019-12-21 DIAGNOSIS — E1129 Type 2 diabetes mellitus with other diabetic kidney complication: Secondary | ICD-10-CM | POA: Diagnosis not present

## 2020-01-03 ENCOUNTER — Ambulatory Visit: Payer: Medicare Other | Admitting: Cardiovascular Disease

## 2020-01-03 ENCOUNTER — Ambulatory Visit (INDEPENDENT_AMBULATORY_CARE_PROVIDER_SITE_OTHER): Payer: Medicare Other | Admitting: Cardiology

## 2020-01-03 ENCOUNTER — Encounter: Payer: Self-pay | Admitting: Cardiology

## 2020-01-03 ENCOUNTER — Telehealth (INDEPENDENT_AMBULATORY_CARE_PROVIDER_SITE_OTHER): Payer: Self-pay | Admitting: Internal Medicine

## 2020-01-03 ENCOUNTER — Other Ambulatory Visit (INDEPENDENT_AMBULATORY_CARE_PROVIDER_SITE_OTHER): Payer: Self-pay | Admitting: Internal Medicine

## 2020-01-03 VITALS — BP 122/78 | HR 60 | Ht 69.0 in | Wt 179.0 lb

## 2020-01-03 DIAGNOSIS — E782 Mixed hyperlipidemia: Secondary | ICD-10-CM | POA: Diagnosis not present

## 2020-01-03 DIAGNOSIS — I2510 Atherosclerotic heart disease of native coronary artery without angina pectoris: Secondary | ICD-10-CM

## 2020-01-03 DIAGNOSIS — I10 Essential (primary) hypertension: Secondary | ICD-10-CM

## 2020-01-03 DIAGNOSIS — I255 Ischemic cardiomyopathy: Secondary | ICD-10-CM

## 2020-01-03 DIAGNOSIS — I5022 Chronic systolic (congestive) heart failure: Secondary | ICD-10-CM

## 2020-01-03 MED ORDER — CIPROFLOXACIN HCL 500 MG PO TABS
500.0000 mg | ORAL_TABLET | Freq: Two times a day (BID) | ORAL | 0 refills | Status: DC
Start: 2020-01-03 — End: 2020-04-10

## 2020-01-03 NOTE — Progress Notes (Signed)
Clinical Summary Mr. Monteverde is a 84 y.o.male seen today for follow up of the following medical problems.   1. ICM/Chronic systolic HF/CAD - history of LAD disease without target for revasc  - has BiV AICD, normal device check 10/2019 - 06/2017 echo LVEF 40-45%.  - medical therapy has been limited by low bp's  - no recent chest pains. No SOB/DOE. No LE edema. - compliatn with meds  2. HTN - compliant with meds   3. Hyperlipidemia - 08/2019 TC 134 HDL 43 TG 132 LDL 69 - he is on atorva 27m and zetia 150m   4. CKD - followed by Dr BhTheador HawthorneHas not had covid vaccine.    Past Medical History:  Diagnosis Date  . Acid reflux   . Blood transfusion without reported diagnosis   . Cardiac arrest ([place]   a. occuring in 12/2016. LAD disease but no targets for CABG or PCI. Underwent ICD placement as EF 15-20%.   . CHF (congestive heart failure) ([place]  . Coronary artery disease   . Diabetes mellitus without complication ([facility]  . Hyperlipidemia   . Hypertension   . Myocardial infarction ([place]  . Renal disorder      No Known Allergies   Current Outpatient Medications  Medication Sig Dispense Refill  . ascorbic acid (VITAMIN C) 500 MG tablet Take 500 mg by mouth 2 (two) times daily.    . Marland Kitchenspirin EC 81 MG tablet Take 81 mg by mouth daily.    . Marland Kitchentorvastatin (LIPITOR) 80 MG tablet Take 1 tablet (80 mg total) by mouth at bedtime. 90 tablet 1  . Bee Pollen 550 MG CAPS Take 1 capsule by mouth 2 (two) times daily.     . blood glucose meter kit and supplies KIT Test daily.  Dx e11.22 1 each 0  . carvedilol (COREG) 6.25 MG tablet TAKE 1 TABLET BY MOUTH TWICE A DAY 180 tablet 0  . Cholecalciferol (VITAMIN D3) 125 MCG (5000 UT) TABS Take 1 tablet by mouth daily.     . CHROMIUM-CINNAMON PO Take 2,000 mg by mouth in the morning and at bedtime.    . ciprofloxacin (CIPRO) 500 MG tablet Take 1 tablet (500 mg total) by mouth 2 (two) times daily. 14 tablet 0  . ezetimibe (ZETIA) 10  MG tablet TAKE 1 TABLET BY MOUTH EVERY DAY 90 tablet 1  . FIBER ADULT GUMMIES PO Take 2 tablets by mouth at bedtime.    . finasteride (PROSCAR) 5 MG tablet Take 1 tablet (5 mg total) by mouth at bedtime. 90 tablet 3  . glimepiride (AMARYL) 4 MG tablet Take 1 tablet (4 mg total) by mouth in the morning and at bedtime. 180 tablet 0  . glucose blood (COOL BLOOD GLUCOSE TEST STRIPS) test strip One Touch Ultra test 1 time a day 100 each 2  . losartan (COZAAR) 25 MG tablet Take 1 tablet (25 mg total) by mouth 2 (two) times daily. 60 tablet 1  . nitroGLYCERIN (NITROSTAT) 0.4 MG SL tablet Place 1 tablet (0.4 mg total) under the tongue every 5 (five) minutes x 3 doses as needed for chest pain. 25 tablet 3  . Omega-3 Fatty Acids (FISH OIL) 1200 MG CAPS Take 1 capsule by mouth 2 (two) times daily.     . Marland Kitchenxybutynin (DITROPAN) 5 MG tablet Take 0.5 tablets (2.5 mg total) by mouth 3 (three) times daily. 135 tablet 3  . tamsulosin (FLOMAX) 0.4 MG CAPS  capsule Take 1 capsule (0.4 mg total) by mouth every morning. 90 capsule 3  . vitamin B-12 (CYANOCOBALAMIN) 1000 MCG tablet Take 1,000 mcg by mouth every morning.      No current facility-administered medications for this visit.     Past Surgical History:  Procedure Laterality Date  . BIV ICD INSERTION CRT-D N/A 01/01/2017   Procedure: BIV ICD INSERTION CRT-D;  Surgeon: Constance Haw, MD;  Location: [facility];  Service: Cardiovascular;  Laterality: N/A;     No Known Allergies    Family History  Problem Relation Age of Onset  . Hypertension Mother   . Hyperlipidemia Mother   . CAD Sister   . Diabetes Sister   . Hyperlipidemia Sister   . Hypertension Sister   . CAD Brother   . Diabetes Brother   . Hypertension Brother   . Hyperlipidemia Brother   . CAD Brother   . Diabetes Brother   . Hypertension Brother   . Hyperlipidemia Brother   . Dementia Brother   . Thyroid disease Daughter   . Hypertension Daughter   . Diabetes  Daughter   . Hyperlipidemia Daughter   . Fibromyalgia Daughter   . Heart disease Daughter        stents  . Multiple sclerosis Daughter   . Thyroid disease Daughter      Social History Mr. Denunzio reports that he quit smoking about 61 years ago. His smoking use included cigarettes. He has never used smokeless tobacco. Mr. Kemler reports no history of alcohol use.   Review of Systems CONSTITUTIONAL: No weight loss, fever, chills, weakness or fatigue.  HEENT: Eyes: No visual loss, blurred vision, double vision or yellow sclerae.No hearing loss, sneezing, congestion, runny nose or sore throat.  SKIN: No rash or itching.  CARDIOVASCULAR: per hpi RESPIRATORY: No shortness of breath, cough or sputum.  GASTROINTESTINAL: No anorexia, nausea, vomiting or diarrhea. No abdominal pain or blood.  GENITOURINARY: No burning on urination, no polyuria NEUROLOGICAL: No headache, dizziness, syncope, paralysis, ataxia, numbness or tingling in the extremities. No change in bowel or bladder control.  MUSCULOSKELETAL: No muscle, back pain, joint pain or stiffness.  LYMPHATICS: No enlarged nodes. No history of splenectomy.  PSYCHIATRIC: No history of depression or anxiety.  ENDOCRINOLOGIC: No reports of sweating, cold or heat intolerance. No polyuria or polydipsia.  Marland Kitchen   Physical Examination Today's Vitals   01/03/20 1127  BP: 122/78  Pulse: 60  SpO2: 98%  Weight: 179 lb (81.2 kg)  Height: _0  (1.753 m)   Body mass index is 26.43 kg/m.  Gen: resting comfortably, no acute distress HEENT: no scleral icterus, pupils equal round and reactive, no palptable cervical adenopathy,  CV: RRR, no m/rg, no jvd Resp: Clear to auscultation bilaterally GI: abdomen is soft, non-tender, non-distended, normal bowel sounds, no hepatosplenomegaly MSK: extremities are warm, no edema.  Skin: warm, no rash Neuro:  no focal deficits Psych: appropriate affect   Diagnostic Studies Echo 06/09/17:  Study  Conclusions  - Left ventricle: The cavity size was normal. Wall thickness was normal. Systolic function was mildly to moderately reduced. The estimated ejection fraction was in the range of 40% to 45%. Diffuse hypokinesis. Doppler parameters are consistent with abnormal left ventricular relaxation (grade 1 diastolic dysfunction). - Aortic valve: Poorly visualized. Probably trileaflet. There was mild regurgitation. - Mitral valve: There was trivial regurgitation. - Left atrium: The atrium was mildly dilated. - Right ventricle: Pacer wire or catheter noted in right ventricle. -  Right atrium: Central venous pressure (est): 3 mm Hg. - Atrial septum: No defect or patent foramen ovale was identified. - Tricuspid valve: There was trivial regurgitation. - Pulmonary arteries: PA peak pressure: 13 mm Hg (S). - Pericardium, extracardiac: There was no pericardial effusion.  Impressions:  - Images are limited, but LVEF has improved in comparison to the previous study in November 2018.  06/2017 echo Study Conclusions   - Left ventricle: The cavity size was normal. Wall thickness was  normal. Systolic function was mildly to moderately reduced. The  estimated ejection fraction was in the range of 40% to 45%.  Diffuse hypokinesis. Doppler parameters are consistent with  abnormal left ventricular relaxation (grade 1 diastolic  dysfunction).  - Aortic valve: Poorly visualized. Probably trileaflet. There was  mild regurgitation.  - Mitral valve: There was trivial regurgitation.  - Left atrium: The atrium was mildly dilated.  - Right ventricle: Pacer wire or catheter noted in right ventricle.  - Right atrium: Central venous pressure (est): 3 mm Hg.  - Atrial septum: No defect or patent foramen ovale was identified.  - Tricuspid valve: There was trivial regurgitation.  - Pulmonary arteries: PA peak pressure: 13 mm Hg (S).  - Pericardium, extracardiac: There was no  pericardial effusion.  Assessment and Plan  1. CAD/Chronic systolic HF/ICM - no current symptoms - medical therapy limited by low bp's, continue current therapy  2. HTN - at goal, continue current meds  3. Hyperlipidemia - at goal, continue current meds        Arnoldo Lenis, M.D.

## 2020-01-03 NOTE — Telephone Encounter (Signed)
Okay, [facility] [place].  If he has symptoms after he finishes this 1 week course, he must call us back and we will need to see him.

## 2020-01-03 NOTE — Patient Instructions (Signed)

## 2020-01-10 ENCOUNTER — Ambulatory Visit: Payer: Medicare Other

## 2020-01-10 LAB — CUP PACEART REMOTE DEVICE CHECK
Battery Remaining Longevity: 60 mo
Battery Remaining Longevity: 60 mo
Battery Voltage: 2.98 V
Battery Voltage: 2.98 V
Brady Statistic AP VP Percent: 65.71 %
Brady Statistic AP VP Percent: 65.71 %
Brady Statistic AP VS Percent: 0.02 %
Brady Statistic AP VS Percent: 0.02 %
Brady Statistic AS VP Percent: 34.1 %
Brady Statistic AS VP Percent: 34.1 %
Brady Statistic AS VS Percent: 0.17 %
Brady Statistic AS VS Percent: 0.17 %
Brady Statistic RA Percent Paced: 65.72 %
Brady Statistic RA Percent Paced: 65.72 %
Brady Statistic RV Percent Paced: 98.96 %
Brady Statistic RV Percent Paced: 98.96 %
Date Time Interrogation Session: 20211208022603
Date Time Interrogation Session: 20211208022603
HighPow Impedance: 80 Ohm
HighPow Impedance: 80 Ohm
Implantable Lead Implant Date: 20181130
Implantable Lead Implant Date: 20181130
Implantable Lead Implant Date: 20181130
Implantable Lead Implant Date: 20181130
Implantable Lead Implant Date: 20181130
Implantable Lead Implant Date: 20181130
Implantable Lead Location: 753858
Implantable Lead Location: 753858
Implantable Lead Location: 753859
Implantable Lead Location: 753859
Implantable Lead Location: 753860
Implantable Lead Location: 753860
Implantable Lead Model: 4598
Implantable Lead Model: 4598
Implantable Lead Model: 5076
Implantable Lead Model: 5076
Implantable Lead Model: 6935
Implantable Lead Model: 6935
Implantable Pulse Generator Implant Date: 20181130
Implantable Pulse Generator Implant Date: 20181130
Lead Channel Impedance Value: 141.867
Lead Channel Impedance Value: 141.867
Lead Channel Impedance Value: 141.867
Lead Channel Impedance Value: 141.867
Lead Channel Impedance Value: 141.867
Lead Channel Impedance Value: 141.867
Lead Channel Impedance Value: 152 Ohm
Lead Channel Impedance Value: 152 Ohm
Lead Channel Impedance Value: 152 Ohm
Lead Channel Impedance Value: 152 Ohm
Lead Channel Impedance Value: 266 Ohm
Lead Channel Impedance Value: 266 Ohm
Lead Channel Impedance Value: 304 Ohm
Lead Channel Impedance Value: 304 Ohm
Lead Channel Impedance Value: 304 Ohm
Lead Channel Impedance Value: 304 Ohm
Lead Channel Impedance Value: 304 Ohm
Lead Channel Impedance Value: 304 Ohm
Lead Channel Impedance Value: 342 Ohm
Lead Channel Impedance Value: 342 Ohm
Lead Channel Impedance Value: 380 Ohm
Lead Channel Impedance Value: 380 Ohm
Lead Channel Impedance Value: 456 Ohm
Lead Channel Impedance Value: 456 Ohm
Lead Channel Impedance Value: 456 Ohm
Lead Channel Impedance Value: 456 Ohm
Lead Channel Impedance Value: 456 Ohm
Lead Channel Impedance Value: 456 Ohm
Lead Channel Impedance Value: 494 Ohm
Lead Channel Impedance Value: 494 Ohm
Lead Channel Impedance Value: 494 Ohm
Lead Channel Impedance Value: 494 Ohm
Lead Channel Impedance Value: 627 Ohm
Lead Channel Impedance Value: 627 Ohm
Lead Channel Impedance Value: 722 Ohm
Lead Channel Impedance Value: 722 Ohm
Lead Channel Pacing Threshold Amplitude: 0.375 V
Lead Channel Pacing Threshold Amplitude: 0.375 V
Lead Channel Pacing Threshold Amplitude: 0.875 V
Lead Channel Pacing Threshold Amplitude: 0.875 V
Lead Channel Pacing Threshold Amplitude: 1 V
Lead Channel Pacing Threshold Amplitude: 1 V
Lead Channel Pacing Threshold Pulse Width: 0.4 ms
Lead Channel Pacing Threshold Pulse Width: 0.4 ms
Lead Channel Pacing Threshold Pulse Width: 0.4 ms
Lead Channel Pacing Threshold Pulse Width: 0.4 ms
Lead Channel Pacing Threshold Pulse Width: 0.4 ms
Lead Channel Pacing Threshold Pulse Width: 0.4 ms
Lead Channel Sensing Intrinsic Amplitude: 10.5 mV
Lead Channel Sensing Intrinsic Amplitude: 10.5 mV
Lead Channel Sensing Intrinsic Amplitude: 10.5 mV
Lead Channel Sensing Intrinsic Amplitude: 10.5 mV
Lead Channel Sensing Intrinsic Amplitude: 2.375 mV
Lead Channel Sensing Intrinsic Amplitude: 2.375 mV
Lead Channel Sensing Intrinsic Amplitude: 2.375 mV
Lead Channel Sensing Intrinsic Amplitude: 2.375 mV
Lead Channel Setting Pacing Amplitude: 1.5 V
Lead Channel Setting Pacing Amplitude: 1.5 V
Lead Channel Setting Pacing Amplitude: 2 V
Lead Channel Setting Pacing Amplitude: 2 V
Lead Channel Setting Pacing Amplitude: 2.5 V
Lead Channel Setting Pacing Amplitude: 2.5 V
Lead Channel Setting Pacing Pulse Width: 0.4 ms
Lead Channel Setting Pacing Pulse Width: 0.4 ms
Lead Channel Setting Pacing Pulse Width: 0.4 ms
Lead Channel Setting Pacing Pulse Width: 0.4 ms
Lead Channel Setting Sensing Sensitivity: 0.3 mV
Lead Channel Setting Sensing Sensitivity: 0.3 mV

## 2020-01-11 ENCOUNTER — Encounter (INDEPENDENT_AMBULATORY_CARE_PROVIDER_SITE_OTHER): Payer: Self-pay | Admitting: Internal Medicine

## 2020-01-11 ENCOUNTER — Other Ambulatory Visit: Payer: Self-pay

## 2020-01-11 ENCOUNTER — Ambulatory Visit (INDEPENDENT_AMBULATORY_CARE_PROVIDER_SITE_OTHER): Payer: Medicare Other | Admitting: Internal Medicine

## 2020-01-11 VITALS — BP 124/72 | HR 63 | Temp 97.3°F | Ht 67.0 in | Wt 178.8 lb

## 2020-01-11 DIAGNOSIS — E1122 Type 2 diabetes mellitus with diabetic chronic kidney disease: Secondary | ICD-10-CM

## 2020-01-11 DIAGNOSIS — N390 Urinary tract infection, site not specified: Secondary | ICD-10-CM | POA: Diagnosis not present

## 2020-01-11 DIAGNOSIS — N183 Chronic kidney disease, stage 3 unspecified: Secondary | ICD-10-CM

## 2020-01-11 DIAGNOSIS — I255 Ischemic cardiomyopathy: Secondary | ICD-10-CM

## 2020-01-11 DIAGNOSIS — I10 Essential (primary) hypertension: Secondary | ICD-10-CM

## 2020-01-11 DIAGNOSIS — E559 Vitamin D deficiency, unspecified: Secondary | ICD-10-CM | POA: Diagnosis not present

## 2020-01-11 LAB — CUP PACEART REMOTE DEVICE CHECK
Battery Remaining Longevity: 60 mo
Battery Voltage: 2.98 V
Brady Statistic AP VP Percent: 65.71 %
Brady Statistic AP VS Percent: 0.02 %
Brady Statistic AS VP Percent: 34.1 %
Brady Statistic AS VS Percent: 0.17 %
Brady Statistic RA Percent Paced: 65.72 %
Brady Statistic RV Percent Paced: 98.96 %
Date Time Interrogation Session: 20211208022603
HighPow Impedance: 80 Ohm
Implantable Lead Implant Date: 20181130
Implantable Lead Implant Date: 20181130
Implantable Lead Implant Date: 20181130
Implantable Lead Location: 753858
Implantable Lead Location: 753859
Implantable Lead Location: 753860
Implantable Lead Model: 4598
Implantable Lead Model: 5076
Implantable Lead Model: 6935
Implantable Pulse Generator Implant Date: 20181130
Lead Channel Impedance Value: 141.867
Lead Channel Impedance Value: 141.867
Lead Channel Impedance Value: 141.867
Lead Channel Impedance Value: 152 Ohm
Lead Channel Impedance Value: 152 Ohm
Lead Channel Impedance Value: 266 Ohm
Lead Channel Impedance Value: 304 Ohm
Lead Channel Impedance Value: 304 Ohm
Lead Channel Impedance Value: 304 Ohm
Lead Channel Impedance Value: 342 Ohm
Lead Channel Impedance Value: 380 Ohm
Lead Channel Impedance Value: 456 Ohm
Lead Channel Impedance Value: 456 Ohm
Lead Channel Impedance Value: 456 Ohm
Lead Channel Impedance Value: 494 Ohm
Lead Channel Impedance Value: 494 Ohm
Lead Channel Impedance Value: 627 Ohm
Lead Channel Impedance Value: 722 Ohm
Lead Channel Pacing Threshold Amplitude: 0.375 V
Lead Channel Pacing Threshold Amplitude: 0.875 V
Lead Channel Pacing Threshold Amplitude: 1 V
Lead Channel Pacing Threshold Pulse Width: 0.4 ms
Lead Channel Pacing Threshold Pulse Width: 0.4 ms
Lead Channel Pacing Threshold Pulse Width: 0.4 ms
Lead Channel Sensing Intrinsic Amplitude: 10.5 mV
Lead Channel Sensing Intrinsic Amplitude: 10.5 mV
Lead Channel Sensing Intrinsic Amplitude: 2.375 mV
Lead Channel Sensing Intrinsic Amplitude: 2.375 mV
Lead Channel Setting Pacing Amplitude: 1.5 V
Lead Channel Setting Pacing Amplitude: 2 V
Lead Channel Setting Pacing Amplitude: 2.5 V
Lead Channel Setting Pacing Pulse Width: 0.4 ms
Lead Channel Setting Pacing Pulse Width: 0.4 ms
Lead Channel Setting Sensing Sensitivity: 0.3 mV

## 2020-01-11 NOTE — Progress Notes (Signed)
Metrics: Intervention Frequency ACO  Documented Smoking Status Yearly  Screened one or more times in 24 months  Cessation Counseling or  Active cessation medication Past 24 months  Past 24 months   Guideline developer: UpToDate (See UpToDate for funding source) Date Released: 2014       Wellness Office Visit  Subjective:  Patient ID: Omar Williams, male    DOB: [birth year]  Age: 84 y.o. MRN: [id number]  CC: This man comes in for follow-up of diabetes, coronary artery disease, hypertension and recent urinary tract infection. HPI  He required a second round of ciprofloxacin which seems to have helped his symptoms of the UTI. He continues on Amaryl for his diabetes. He continues on statin therapy for hyperlipidemia in the face of coronary artery disease and diabetes. He has no specific complaints today. Past Medical History:  Diagnosis Date  . Acid reflux   . Blood transfusion without reported diagnosis   . Cardiac arrest ([place])    a. occuring in 12/2016. LAD disease but no targets for CABG or PCI. Underwent ICD placement as EF 15-20%.   . CHF (congestive heart failure) ([place])   . Coronary artery disease   . Diabetes mellitus without complication ([place])   . Hyperlipidemia   . Hypertension   . Myocardial infarction ([place])   . Renal disorder    Past Surgical History:  Procedure Laterality Date  . BIV ICD INSERTION CRT-D N/A 01/01/2017   Procedure: BIV ICD INSERTION CRT-D;  Surgeon: Constance Haw, MD;  Location: [facility];  Service: Cardiovascular;  Laterality: N/A;     Family History  Problem Relation Age of Onset  . Hypertension Mother   . Hyperlipidemia Mother   . CAD Sister   . Diabetes Sister   . Hyperlipidemia Sister   . Hypertension Sister   . CAD Brother   . Diabetes Brother   . Hypertension Brother   . Hyperlipidemia Brother   . CAD Brother   . Diabetes Brother   . Hypertension Brother   . Hyperlipidemia Brother   . Dementia Brother   . Thyroid  disease Daughter   . Hypertension Daughter   . Diabetes Daughter   . Hyperlipidemia Daughter   . Fibromyalgia Daughter   . Heart disease Daughter        stents  . Multiple sclerosis Daughter   . Thyroid disease Daughter     Social History   Social History Narrative   Lives with with wife Omar Williams   Married 18 years   Daughter Omar Williams stays frequently   Granddaughter Omar Williams near by   Social History   Tobacco Use  . Smoking status: Former Smoker    Types: Cigarettes    Quit date: 1960    Years since quitting: 61.9  . Smokeless tobacco: Never Used  . Tobacco comment: 2-3 years as a teenager  Substance Use Topics  . Alcohol use: No    Current Meds  Medication Sig  . ascorbic acid (VITAMIN C) 500 MG tablet Take 500 mg by mouth 2 (two) times daily.  Marland Kitchen aspirin EC 81 MG tablet Take 81 mg by mouth daily.  Marland Kitchen atorvastatin (LIPITOR) 80 MG tablet Take 1 tablet (80 mg total) by mouth at bedtime.  Raelyn Ensign Pollen 550 MG CAPS Take 1 capsule by mouth 2 (two) times daily.   . blood glucose meter kit and supplies KIT Test daily.  Dx e11.22  . carvedilol (COREG) 6.25 MG tablet TAKE 1 TABLET BY MOUTH  TWICE A DAY  . chlorhexidine (PERIDEX) 0.12 % solution SMARTSIG:By Mouth  . Cholecalciferol (VITAMIN D3) 125 MCG (5000 UT) TABS Take 1 tablet by mouth daily.   . CHROMIUM-CINNAMON PO Take 2,000 mg by mouth in the morning and at bedtime.  Marland Kitchen ezetimibe (ZETIA) 10 MG tablet TAKE 1 TABLET BY MOUTH EVERY DAY  . FIBER ADULT GUMMIES PO Take 2 tablets by mouth at bedtime.  . finasteride (PROSCAR) 5 MG tablet Take 1 tablet (5 mg total) by mouth at bedtime.  Marland Kitchen glimepiride (AMARYL) 4 MG tablet Take 1 tablet (4 mg total) by mouth in the morning and at bedtime.  Marland Kitchen glucose blood (COOL BLOOD GLUCOSE TEST STRIPS) test strip One Touch Ultra test 1 time a day  . losartan (COZAAR) 25 MG tablet Take 1 tablet (25 mg total) by mouth 2 (two) times daily.  . nitroGLYCERIN (NITROSTAT) 0.4 MG SL tablet Place 1 tablet (0.4  mg total) under the tongue every 5 (five) minutes x 3 doses as needed for chest pain.  . Omega-3 Fatty Acids (FISH OIL) 1200 MG CAPS Take 1 capsule by mouth 2 (two) times daily.   Marland Kitchen oxybutynin (DITROPAN) 5 MG tablet Take 0.5 tablets (2.5 mg total) by mouth 3 (three) times daily.  . tamsulosin (FLOMAX) 0.4 MG CAPS capsule Take 1 capsule (0.4 mg total) by mouth every morning.  . vitamin B-12 (CYANOCOBALAMIN) 1000 MCG tablet Take 1,000 mcg by mouth every morning.       Depression screen [facility] 2/9 06/02/2019 04/17/2019 10/06/2018 08/26/2017 05/25/2017  Decreased Interest 0 0 0 0 0  Down, Depressed, Hopeless 0 0 0 0 0  PHQ - 2 Score 0 0 0 0 0  Altered sleeping - - 0 - -  Tired, decreased energy - - 0 - -  Change in appetite - - 0 - -  Feeling bad or failure about yourself  - - 0 - -  Trouble concentrating - - 0 - -  Moving slowly or fidgety/restless - - 0 - -  Suicidal thoughts - - 0 - -  PHQ-9 Score - - 0 - -  Difficult doing work/chores - - Not difficult at all - -     Objective:   Today's Vitals: BP 124/72   Pulse 63   Temp (!) 97.3 F (36.3 C) (Temporal)   Ht _0  (1.702 m)   Wt 196 lb (88.9 kg)   SpO2 96%   BMI 30.70 kg/m  Vitals with BMI 01/11/2020 01/03/2020 11/08/2019  Height _1  _2  _3   Weight 196 lbs 179 lbs 178 lbs  BMI 30.69 96.75 91.63  Systolic 846 659 935  Diastolic 72 78 70  Pulse 63 60 69     Physical Exam   He looks systemically well.  He has gained significant amount of weight since the last time he was seen.  Blood pressure is in a good range.    Assessment   1. Vitamin D deficiency   2. Essential hypertension   3. Type 2 diabetes mellitus with stage 3 chronic kidney disease, without long-term current use of insulin, unspecified whether stage 3a or 3b CKD ([place])   4. Urinary tract infection without hematuria, site unspecified       Tests ordered Orders Placed This Encounter  Procedures  . COMPLETE METABOLIC PANEL WITH GFR  . Hemoglobin A1c   . Urinalysis w microscopic + reflex cultur     Plan: 1. He will continue with antihypertensive therapy as  before.  I will check renal function today. 2. He will continue with Amaryl for his diabetes and we will check an A1c today. 3. We will repeat urinalysis and culture to make sure his urine infection has cleared. 4. Follow-up as scheduled with Judson Roch next May.  He still is reluctant to take COVID-19 vaccination.   No orders of the defined types were placed in this encounter.   Doree Albee, MD

## 2020-01-12 LAB — NO CULTURE INDICATED

## 2020-01-12 LAB — COMPLETE METABOLIC PANEL WITH GFR
AG Ratio: 1.6 (calc) (ref 1.0–2.5)
ALT: 20 U/L (ref 9–46)
AST: 19 U/L (ref 10–35)
Albumin: 4.4 g/dL (ref 3.6–5.1)
Alkaline phosphatase (APISO): 64 U/L (ref 35–144)
BUN/Creatinine Ratio: 18 (calc) (ref 6–22)
BUN: 23 mg/dL (ref 7–25)
CO2: 27 mmol/L (ref 20–32)
Calcium: 9.6 mg/dL (ref 8.6–10.3)
Chloride: 106 mmol/L (ref 98–110)
Creat: 1.29 mg/dL — ABNORMAL HIGH (ref 0.70–1.11)
GFR, Est African American: 59 mL/min/{1.73_m2} — ABNORMAL LOW (ref >=60)
GFR, Est Non African American: 51 mL/min/{1.73_m2} — ABNORMAL LOW (ref >=60)
Globulin: 2.8 g/dL (calc) (ref 1.9–3.7)
Glucose, Bld: 153 mg/dL — ABNORMAL HIGH (ref 65–99)
Potassium: 4.6 mmol/L (ref 3.5–5.3)
Sodium: 140 mmol/L (ref 135–146)
Total Bilirubin: 0.8 mg/dL (ref 0.2–1.2)
Total Protein: 7.2 g/dL (ref 6.1–8.1)

## 2020-01-12 LAB — URINALYSIS W MICROSCOPIC + REFLEX CULTURE
Bacteria, UA: NONE SEEN /HPF
Bilirubin Urine: NEGATIVE
Hgb urine dipstick: NEGATIVE
Hyaline Cast: NONE SEEN /LPF
Ketones, ur: NEGATIVE
Leukocyte Esterase: NEGATIVE
Nitrites, Initial: NEGATIVE
RBC / HPF: NONE SEEN /HPF (ref 0–2)
Specific Gravity, Urine: 1.019 (ref 1.001–1.03)
Squamous Epithelial / HPF: NONE SEEN /HPF (ref <=5)
WBC, UA: NONE SEEN /HPF (ref 0–5)
pH: 5.5 (ref 5.0–8.0)

## 2020-01-12 LAB — HEMOGLOBIN A1C
Hgb A1c MFr Bld: 7.7 % of total Hgb — ABNORMAL HIGH (ref <5.7)
Mean Plasma Glucose: 174 mg/dL
eAG (mmol/L): 9.7 mmol/L

## 2020-01-22 ENCOUNTER — Ambulatory Visit (INDEPENDENT_AMBULATORY_CARE_PROVIDER_SITE_OTHER): Payer: Medicare Other

## 2020-01-22 DIAGNOSIS — I5022 Chronic systolic (congestive) heart failure: Secondary | ICD-10-CM | POA: Diagnosis not present

## 2020-01-22 DIAGNOSIS — Z95810 Presence of automatic (implantable) cardiac defibrillator: Secondary | ICD-10-CM

## 2020-01-23 NOTE — Progress Notes (Signed)
EPIC Encounter for ICM Monitoring  Patient Name: Duong Haydel is a 84 y.o. male Date: 01/23/2020 Primary Care Physican: Wilson Singer, MD Primary Cardiologist: Wyline Mood Electrophysiologist:Allred Nephrologist:[facility] Bi-V Pacing:99.9% 12/21/2021Weight:178lbs  Spoke with patient and reports feeling well at this time.  Denies fluid symptoms.    OptivolThoracic impedancesuggestingnormalfluid levels.  Prescribed:No diuretic  Labs: 08/08/2019 Creatinine 1.38, BUN 25, Potassium 4.5, Sodium 141 06/16/2019 Creatinine 1.56, BUN 23, Potassium 5.5, Sodium 143, GFR 41-47 Care Everywhere  Recommendations: No changes and encouraged to call if experiencing any fluid symptoms.  Follow-up plan: ICM clinic phone appointment on2/02/2020. 91 day device clinic remote transmission12/09/2019.   EP/Cardiology Office Visits: None scheduled.   Copy of ICM check sent to Dr.Allred.   3 month ICM trend: 01/22/2020    1 Year ICM trend:       Karie Soda, RN 01/23/2020 10:44 AM

## 2020-02-04 ENCOUNTER — Other Ambulatory Visit: Payer: Self-pay | Admitting: Family Medicine

## 2020-02-08 ENCOUNTER — Ambulatory Visit (INDEPENDENT_AMBULATORY_CARE_PROVIDER_SITE_OTHER): Payer: Medicare Other | Admitting: Nurse Practitioner

## 2020-02-10 ENCOUNTER — Other Ambulatory Visit: Payer: Self-pay | Admitting: Family Medicine

## 2020-02-14 ENCOUNTER — Other Ambulatory Visit (INDEPENDENT_AMBULATORY_CARE_PROVIDER_SITE_OTHER): Payer: Self-pay | Admitting: Internal Medicine

## 2020-02-14 DIAGNOSIS — E1122 Type 2 diabetes mellitus with diabetic chronic kidney disease: Secondary | ICD-10-CM

## 2020-02-14 DIAGNOSIS — N183 Chronic kidney disease, stage 3 unspecified: Secondary | ICD-10-CM

## 2020-02-15 ENCOUNTER — Other Ambulatory Visit (INDEPENDENT_AMBULATORY_CARE_PROVIDER_SITE_OTHER): Payer: Self-pay | Admitting: Internal Medicine

## 2020-02-15 DIAGNOSIS — E119 Type 2 diabetes mellitus without complications: Secondary | ICD-10-CM

## 2020-03-05 ENCOUNTER — Ambulatory Visit (INDEPENDENT_AMBULATORY_CARE_PROVIDER_SITE_OTHER): Payer: Medicare Other

## 2020-03-05 DIAGNOSIS — I5022 Chronic systolic (congestive) heart failure: Secondary | ICD-10-CM | POA: Diagnosis not present

## 2020-03-05 DIAGNOSIS — Z95810 Presence of automatic (implantable) cardiac defibrillator: Secondary | ICD-10-CM | POA: Diagnosis not present

## 2020-03-08 NOTE — Progress Notes (Signed)
EPIC Encounter for ICM Monitoring  Patient Name: Omar Williams is a 85 y.o. male Date: 03/08/2020 Primary Care Physican: Wilson Singer, MD Primary Cardiologist: Wyline Mood Electrophysiologist:Allred Nephrologist:[facility] Bi-V Pacing:99.8% 2/4/2022Weight:178lbs  Spoke with patient and reports feeling well at this time.  Denies fluid symptoms.    OptivolThoracic impedancesuggestingnormalfluid levels.  Prescribed:No diuretic  Labs: 08/08/2019 Creatinine 1.38, BUN 25, Potassium 4.5, Sodium 141 06/16/2019 Creatinine 1.56, BUN 23, Potassium 5.5, Sodium 143, GFR 41-47 Care Everywhere  Recommendations:No changes and encouraged to call if experiencing any fluid symptoms.  Follow-up plan: ICM clinic phone appointment on3/12/2020. 91 day device clinic remote transmission3/10/2020.   EP/Cardiology Office Visits:07/19/2020 with Dr.Branch.   Copy of ICM check sent to Dr.Allred.  3 month ICM trend: 03/05/2020.    1 Year ICM trend:       Karie Soda, RN 03/08/2020 5:23 PM

## 2020-03-10 ENCOUNTER — Other Ambulatory Visit (INDEPENDENT_AMBULATORY_CARE_PROVIDER_SITE_OTHER): Payer: Self-pay | Admitting: Internal Medicine

## 2020-03-10 DIAGNOSIS — E559 Vitamin D deficiency, unspecified: Secondary | ICD-10-CM

## 2020-03-10 DIAGNOSIS — I10 Essential (primary) hypertension: Secondary | ICD-10-CM

## 2020-03-13 DIAGNOSIS — U071 COVID-19: Secondary | ICD-10-CM | POA: Diagnosis not present

## 2020-03-13 DIAGNOSIS — I509 Heart failure, unspecified: Secondary | ICD-10-CM | POA: Diagnosis not present

## 2020-03-13 DIAGNOSIS — J1282 Pneumonia due to coronavirus disease 2019: Secondary | ICD-10-CM | POA: Diagnosis not present

## 2020-03-13 DIAGNOSIS — J189 Pneumonia, unspecified organism: Secondary | ICD-10-CM | POA: Diagnosis not present

## 2020-03-13 DIAGNOSIS — R509 Fever, unspecified: Secondary | ICD-10-CM | POA: Diagnosis not present

## 2020-03-13 DIAGNOSIS — R739 Hyperglycemia, unspecified: Secondary | ICD-10-CM | POA: Diagnosis not present

## 2020-03-13 DIAGNOSIS — E11649 Type 2 diabetes mellitus with hypoglycemia without coma: Secondary | ICD-10-CM | POA: Diagnosis not present

## 2020-03-13 DIAGNOSIS — A419 Sepsis, unspecified organism: Secondary | ICD-10-CM | POA: Diagnosis not present

## 2020-03-13 DIAGNOSIS — I130 Hypertensive heart and chronic kidney disease with heart failure and stage 1 through stage 4 chronic kidney disease, or unspecified chronic kidney disease: Secondary | ICD-10-CM | POA: Diagnosis not present

## 2020-03-13 DIAGNOSIS — J9601 Acute respiratory failure with hypoxia: Secondary | ICD-10-CM | POA: Diagnosis not present

## 2020-03-13 DIAGNOSIS — J811 Chronic pulmonary edema: Secondary | ICD-10-CM | POA: Diagnosis not present

## 2020-03-13 DIAGNOSIS — I5023 Acute on chronic systolic (congestive) heart failure: Secondary | ICD-10-CM | POA: Diagnosis not present

## 2020-03-13 DIAGNOSIS — I517 Cardiomegaly: Secondary | ICD-10-CM | POA: Diagnosis not present

## 2020-03-13 DIAGNOSIS — R059 Cough, unspecified: Secondary | ICD-10-CM | POA: Diagnosis not present

## 2020-03-13 DIAGNOSIS — R0902 Hypoxemia: Secondary | ICD-10-CM | POA: Diagnosis not present

## 2020-03-13 DIAGNOSIS — R0602 Shortness of breath: Secondary | ICD-10-CM | POA: Diagnosis not present

## 2020-03-14 DIAGNOSIS — E11649 Type 2 diabetes mellitus with hypoglycemia without coma: Secondary | ICD-10-CM | POA: Diagnosis present

## 2020-03-14 DIAGNOSIS — E1122 Type 2 diabetes mellitus with diabetic chronic kidney disease: Secondary | ICD-10-CM | POA: Diagnosis not present

## 2020-03-14 DIAGNOSIS — E7800 Pure hypercholesterolemia, unspecified: Secondary | ICD-10-CM | POA: Diagnosis present

## 2020-03-14 DIAGNOSIS — N179 Acute kidney failure, unspecified: Secondary | ICD-10-CM | POA: Diagnosis not present

## 2020-03-14 DIAGNOSIS — N183 Chronic kidney disease, stage 3 unspecified: Secondary | ICD-10-CM | POA: Diagnosis not present

## 2020-03-14 DIAGNOSIS — R0902 Hypoxemia: Secondary | ICD-10-CM | POA: Diagnosis not present

## 2020-03-14 DIAGNOSIS — N189 Chronic kidney disease, unspecified: Secondary | ICD-10-CM | POA: Diagnosis not present

## 2020-03-14 DIAGNOSIS — I10 Essential (primary) hypertension: Secondary | ICD-10-CM | POA: Diagnosis not present

## 2020-03-14 DIAGNOSIS — N1832 Chronic kidney disease, stage 3b: Secondary | ICD-10-CM | POA: Diagnosis present

## 2020-03-14 DIAGNOSIS — E119 Type 2 diabetes mellitus without complications: Secondary | ICD-10-CM | POA: Diagnosis not present

## 2020-03-14 DIAGNOSIS — J90 Pleural effusion, not elsewhere classified: Secondary | ICD-10-CM | POA: Diagnosis not present

## 2020-03-14 DIAGNOSIS — I517 Cardiomegaly: Secondary | ICD-10-CM | POA: Diagnosis not present

## 2020-03-14 DIAGNOSIS — Z7984 Long term (current) use of oral hypoglycemic drugs: Secondary | ICD-10-CM | POA: Diagnosis not present

## 2020-03-14 DIAGNOSIS — J1282 Pneumonia due to coronavirus disease 2019: Secondary | ICD-10-CM | POA: Diagnosis not present

## 2020-03-14 DIAGNOSIS — E162 Hypoglycemia, unspecified: Secondary | ICD-10-CM | POA: Diagnosis not present

## 2020-03-14 DIAGNOSIS — I509 Heart failure, unspecified: Secondary | ICD-10-CM | POA: Diagnosis not present

## 2020-03-14 DIAGNOSIS — A419 Sepsis, unspecified organism: Secondary | ICD-10-CM | POA: Diagnosis present

## 2020-03-14 DIAGNOSIS — J9601 Acute respiratory failure with hypoxia: Secondary | ICD-10-CM | POA: Diagnosis not present

## 2020-03-14 DIAGNOSIS — I5023 Acute on chronic systolic (congestive) heart failure: Secondary | ICD-10-CM | POA: Diagnosis not present

## 2020-03-14 DIAGNOSIS — I130 Hypertensive heart and chronic kidney disease with heart failure and stage 1 through stage 4 chronic kidney disease, or unspecified chronic kidney disease: Secondary | ICD-10-CM | POA: Diagnosis present

## 2020-03-14 DIAGNOSIS — I255 Ischemic cardiomyopathy: Secondary | ICD-10-CM | POA: Diagnosis present

## 2020-03-14 DIAGNOSIS — I2510 Atherosclerotic heart disease of native coronary artery without angina pectoris: Secondary | ICD-10-CM | POA: Diagnosis present

## 2020-03-14 DIAGNOSIS — U071 COVID-19: Secondary | ICD-10-CM | POA: Diagnosis not present

## 2020-03-14 DIAGNOSIS — R0600 Dyspnea, unspecified: Secondary | ICD-10-CM | POA: Diagnosis not present

## 2020-03-14 DIAGNOSIS — Z87891 Personal history of nicotine dependence: Secondary | ICD-10-CM | POA: Diagnosis not present

## 2020-03-14 DIAGNOSIS — I252 Old myocardial infarction: Secondary | ICD-10-CM | POA: Diagnosis not present

## 2020-03-14 DIAGNOSIS — R6520 Severe sepsis without septic shock: Secondary | ICD-10-CM | POA: Diagnosis present

## 2020-03-14 DIAGNOSIS — Z950 Presence of cardiac pacemaker: Secondary | ICD-10-CM | POA: Diagnosis not present

## 2020-03-18 ENCOUNTER — Other Ambulatory Visit (INDEPENDENT_AMBULATORY_CARE_PROVIDER_SITE_OTHER): Payer: Self-pay | Admitting: Internal Medicine

## 2020-03-18 DIAGNOSIS — I10 Essential (primary) hypertension: Secondary | ICD-10-CM

## 2020-03-18 DIAGNOSIS — E559 Vitamin D deficiency, unspecified: Secondary | ICD-10-CM

## 2020-04-01 ENCOUNTER — Telehealth: Payer: Self-pay

## 2020-04-01 NOTE — Telephone Encounter (Signed)
Transition Care Management Follow-up Telephone Call  Date of discharge and from where: 03/31/2020 from [facility]  How have you been since you were released from the hospital? Pt stated that he is feeling better. Pt is able to eat and drink.   Any questions or concerns? No  Items Reviewed:  Did the pt receive and understand the discharge instructions provided? Yes   Medications obtained and verified? Yes   Other? No   Any new allergies since your discharge? No   Dietary orders reviewed? DM and Heart Healthy.   Do you have support at home? Yes   Functional Questionnaire: (I = Independent and D = Dependent) ADLs: I  Bathing/Dressing- I  Meal Prep- I  Eating- I  Maintaining continence- I  Transferring/Ambulation- I  Managing Meds- I    Follow up appointments reviewed:   PCP Hospital f/u appt confirmed? No  Pt encouraged to call for Hospital follow up.   Specialist Hospital f/u appt confirmed? No    Are transportation arrangements needed? No   If their condition worsens, is the pt aware to call PCP or go to the Emergency Dept.? Yes  Was the patient provided with contact information for the PCP's office or ED? Yes  Was to pt encouraged to call back with questions or concerns? Yes

## 2020-04-02 DIAGNOSIS — I130 Hypertensive heart and chronic kidney disease with heart failure and stage 1 through stage 4 chronic kidney disease, or unspecified chronic kidney disease: Secondary | ICD-10-CM | POA: Diagnosis not present

## 2020-04-02 DIAGNOSIS — I2510 Atherosclerotic heart disease of native coronary artery without angina pectoris: Secondary | ICD-10-CM | POA: Diagnosis not present

## 2020-04-02 DIAGNOSIS — U071 COVID-19: Secondary | ICD-10-CM | POA: Diagnosis not present

## 2020-04-02 DIAGNOSIS — Z9981 Dependence on supplemental oxygen: Secondary | ICD-10-CM | POA: Diagnosis not present

## 2020-04-02 DIAGNOSIS — Z7984 Long term (current) use of oral hypoglycemic drugs: Secondary | ICD-10-CM | POA: Diagnosis not present

## 2020-04-02 DIAGNOSIS — E1122 Type 2 diabetes mellitus with diabetic chronic kidney disease: Secondary | ICD-10-CM | POA: Diagnosis not present

## 2020-04-02 DIAGNOSIS — I5023 Acute on chronic systolic (congestive) heart failure: Secondary | ICD-10-CM | POA: Diagnosis not present

## 2020-04-02 DIAGNOSIS — Z7901 Long term (current) use of anticoagulants: Secondary | ICD-10-CM | POA: Diagnosis not present

## 2020-04-02 DIAGNOSIS — I255 Ischemic cardiomyopathy: Secondary | ICD-10-CM | POA: Diagnosis not present

## 2020-04-02 DIAGNOSIS — J1281 Pneumonia due to SARS-associated coronavirus: Secondary | ICD-10-CM | POA: Diagnosis not present

## 2020-04-02 DIAGNOSIS — N1832 Chronic kidney disease, stage 3b: Secondary | ICD-10-CM | POA: Diagnosis not present

## 2020-04-08 ENCOUNTER — Telehealth (INDEPENDENT_AMBULATORY_CARE_PROVIDER_SITE_OTHER): Payer: Self-pay

## 2020-04-08 NOTE — Telephone Encounter (Signed)
Omar Williams, a physical therapist with [facility], called and left a detailed voice message for approval on verbal orders for PT and OT for patient as he was discharged after Covid treatment. Orders are for Once a week for 8 weeks for strengthening, transfers, balance, and gait training. [phone]  OK for PT and OT orders?

## 2020-04-08 NOTE — Telephone Encounter (Signed)
Okay, please go ahead and approve those orders verbally.  Thanks.

## 2020-04-08 NOTE — Telephone Encounter (Signed)
Called Synetta Fail and gave her the verbal approval from Dr. Karilyn Cota. Synetta Fail verbalized an understanding and thanked [place].

## 2020-04-10 ENCOUNTER — Ambulatory Visit (INDEPENDENT_AMBULATORY_CARE_PROVIDER_SITE_OTHER): Payer: Medicare Other

## 2020-04-10 ENCOUNTER — Other Ambulatory Visit: Payer: Self-pay

## 2020-04-10 ENCOUNTER — Encounter (INDEPENDENT_AMBULATORY_CARE_PROVIDER_SITE_OTHER): Payer: Self-pay | Admitting: Nurse Practitioner

## 2020-04-10 ENCOUNTER — Ambulatory Visit (INDEPENDENT_AMBULATORY_CARE_PROVIDER_SITE_OTHER): Payer: Medicare Other | Admitting: Nurse Practitioner

## 2020-04-10 VITALS — BP 126/70 | HR 73 | Temp 97.3°F | Ht 67.0 in | Wt 161.6 lb

## 2020-04-10 DIAGNOSIS — I5023 Acute on chronic systolic (congestive) heart failure: Secondary | ICD-10-CM | POA: Diagnosis not present

## 2020-04-10 DIAGNOSIS — J1282 Pneumonia due to coronavirus disease 2019: Secondary | ICD-10-CM

## 2020-04-10 DIAGNOSIS — K5900 Constipation, unspecified: Secondary | ICD-10-CM

## 2020-04-10 DIAGNOSIS — R531 Weakness: Secondary | ICD-10-CM | POA: Diagnosis not present

## 2020-04-10 DIAGNOSIS — E1122 Type 2 diabetes mellitus with diabetic chronic kidney disease: Secondary | ICD-10-CM | POA: Diagnosis not present

## 2020-04-10 DIAGNOSIS — J1281 Pneumonia due to SARS-associated coronavirus: Secondary | ICD-10-CM | POA: Diagnosis not present

## 2020-04-10 DIAGNOSIS — I255 Ischemic cardiomyopathy: Secondary | ICD-10-CM

## 2020-04-10 DIAGNOSIS — U071 COVID-19: Secondary | ICD-10-CM

## 2020-04-10 DIAGNOSIS — I130 Hypertensive heart and chronic kidney disease with heart failure and stage 1 through stage 4 chronic kidney disease, or unspecified chronic kidney disease: Secondary | ICD-10-CM | POA: Diagnosis not present

## 2020-04-10 MED ORDER — SENNOSIDES-DOCUSATE SODIUM 8.6-50 MG PO TABS: 1.0000 | ORAL_TABLET | Freq: Two times a day (BID) | ORAL | 0 refills | Status: DC | PRN

## 2020-04-10 NOTE — Progress Notes (Signed)
Subjective:  Patient ID: Omar Williams, male    DOB: [birth year]  Age: 85 y.o. MRN: [id number]  CC:  Chief Complaint  Patient presents with  . Hospitalization Follow-up    Doing okay since hospital      HPI  This patient arrives today for the above.  She was hospitalized from 03/13/2020 to 03/31/2020 for pneumonia due to COVID-19 virus.  He is now at home and is working with physical therapy and Occupational Therapy.  He tells me that he still feels weak but overall his strength is returning.  He is also on continuous oxygen at this time.  He is on 3 L/min.  He does have a pulse oximeter at home and she is using to monitor his oxygen saturation.  Tells me he is generally around 93%.  He does have access to a walker at home but would like prescription for a cane.  He is also requesting prescription for a wedge pillow to help him when he needs to sit up and an elevated toilet seat.  He does have support from his wife and daughter at home.  He tells me has been feeling a bit constipated and is wondering what he can take for this constipation.  He has been taking what sounds like MiraLAX as needed but this has not been helping him. He continues on eliquis.  Past Medical History:  Diagnosis Date  . Acid reflux   . Blood transfusion without reported diagnosis   . Cardiac arrest ([place])    a. occuring in 12/2016. LAD disease but no targets for CABG or PCI. Underwent ICD placement as EF 15-20%.   . CHF (congestive heart failure) ([place])   . Coronary artery disease   . Diabetes mellitus without complication ([place])   . Hyperlipidemia   . Hypertension   . Myocardial infarction ([place])   . Renal disorder       Family History  Problem Relation Age of Onset  . Hypertension Mother   . Hyperlipidemia Mother   . CAD Sister   . Diabetes Sister   . Hyperlipidemia Sister   . Hypertension Sister   . CAD Brother   . Diabetes Brother   . Hypertension Brother   . Hyperlipidemia Brother   . CAD Brother    . Diabetes Brother   . Hypertension Brother   . Hyperlipidemia Brother   . Dementia Brother   . Thyroid disease Daughter   . Hypertension Daughter   . Diabetes Daughter   . Hyperlipidemia Daughter   . Fibromyalgia Daughter   . Heart disease Daughter        stents  . Multiple sclerosis Daughter   . Thyroid disease Daughter     Social History   Social History Narrative   Lives with with wife Omar Williams   Married 56 years   Daughter Omar Williams stays frequently   Granddaughter Omar Williams near by   Social History   Tobacco Use  . Smoking status: Former Smoker    Types: Cigarettes    Quit date: 1960    Years since quitting: 62.2  . Smokeless tobacco: Never Used  . Tobacco comment: 2-3 years as a teenager  Substance Use Topics  . Alcohol use: No     Current Meds  Medication Sig  . apixaban (ELIQUIS) 5 MG TABS tablet Take by mouth.  Marland Kitchen ascorbic acid (VITAMIN C) 1000 MG tablet Take 1 tablet by mouth daily.  Marland Kitchen aspirin EC 81 MG tablet Take 81  mg by mouth daily.  Marland Kitchen atorvastatin (LIPITOR) 80 MG tablet Take 1 tablet (80 mg total) by mouth at bedtime.  Omar Williams 550 MG CAPS Take 1 capsule by mouth 2 (two) times daily.   . blood glucose meter kit and supplies KIT Test daily.  Dx e11.22  . carvedilol (COREG) 6.25 MG tablet TAKE 1 TABLET BY MOUTH TWICE A DAY  . chlorhexidine (PERIDEX) 0.12 % solution SMARTSIG:By Mouth  . Cholecalciferol (VITAMIN D3) 125 MCG (5000 UT) TABS Take 1 tablet by mouth daily.   . CHROMIUM-CINNAMON PO Take 2,000 mg by mouth in the morning and at bedtime.  Marland Kitchen ezetimibe (ZETIA) 10 MG tablet TAKE 1 TABLET BY MOUTH EVERY DAY  . FIBER ADULT GUMMIES PO Take 2 tablets by mouth at bedtime.  . finasteride (PROSCAR) 5 MG tablet Take 1 tablet (5 mg total) by mouth at bedtime.  Marland Kitchen glimepiride (AMARYL) 4 MG tablet TAKE 1 TABLET (4 MG TOTAL) BY MOUTH IN THE MORNING AND AT BEDTIME.  Marland Kitchen losartan (COZAAR) 25 MG tablet TAKE 1 TABLET BY MOUTH TWICE A DAY  . nitroGLYCERIN  (NITROSTAT) 0.4 MG SL tablet Place 1 tablet (0.4 mg total) under the tongue every 5 (five) minutes x 3 doses as needed for chest pain.  . Omega-3 Fatty Acids (FISH OIL) 1200 MG CAPS Take 1 capsule by mouth 2 (two) times daily.   Omar Williams ULTRA test strip TEST ONE TIME DAILY E11.9  . oxybutynin (DITROPAN) 5 MG tablet Take 0.5 tablets (2.5 mg total) by mouth 3 (three) times daily.  Marland Kitchen senna-docusate (SENOKOT-S) 8.6-50 MG tablet Take 1 tablet by mouth 2 (two) times daily as needed for mild constipation.  . tamsulosin (FLOMAX) 0.4 MG CAPS capsule Take 1 capsule (0.4 mg total) by mouth every morning.  . vitamin B-12 (CYANOCOBALAMIN) 1000 MCG tablet Take 1,000 mcg by mouth every morning.     ROS:  Review of Systems  Respiratory: Positive for shortness of breath (improving).   Gastrointestinal: Positive for constipation.  Musculoskeletal: Positive for falls.  Neurological: Positive for weakness.     Objective:   Today's Vitals: BP 126/70   Pulse 73   Temp (!) 97.3 F (36.3 C) (Temporal)   Ht '5\' 7"'  (1.702 m)   Wt 161 lb 9.6 oz (73.3 kg)   SpO2 93%   BMI 25.31 kg/m  Vitals with BMI 04/10/2020 01/11/2020 01/03/2020  Height '5\' 7"'  '5\' 7"'  '5\' 9"'   Weight 161 lbs 10 oz 178 lbs 13 oz 179 lbs  BMI 67.6 28 19.50  Systolic 932 671 245  Diastolic 70 72 78  Pulse 73 63 60     Physical Exam Vitals reviewed.  Constitutional:      Appearance: Normal appearance.  HENT:     Head: Normocephalic and atraumatic.  Cardiovascular:     Rate and Rhythm: Normal rate and regular rhythm.  Pulmonary:     Effort: Pulmonary effort is normal.     Breath sounds: Normal breath sounds.  Musculoskeletal:     Cervical back: Neck supple.  Skin:    General: Skin is warm and dry.  Neurological:     Mental Status: He is alert and oriented to person, place, and time.  Psychiatric:        Mood and Affect: Mood normal.        Behavior: Behavior normal.        Thought Content: Thought content normal.         Judgment: Judgment normal.  Assessment and Plan   1. Pneumonia due to COVID-19 virus   2. Weakness   3. Constipation, unspecified constipation type      Plan: 1.  He will continue working with OT and PT at home.  Strength is returning but slowly.  He will continue using his at home oxygen and we may consider testing in the next office visit to determine if maybe he can stop taking using oxygen at home.  He will continue on Eliquis until prescription runs out.  We will check CMP for further evaluation today as well. 2.  I will provide prescription for cane, elevated toilet seat, and wedge pillow to assist him.  I also encouraged him to consider getting a life alert in the event that he falls at home. 3.  We will prescribe senna S that he can take as needed for constipation.   Tests ordered Orders Placed This Encounter  Procedures  . For home use only DME Other see comment  . For home use only DME Other see comment  . For home use only DME Other see comment  . CMP with eGFR([facility])      Meds ordered this encounter  Medications  . senna-docusate (SENOKOT-S) 8.6-50 MG tablet    Sig: Take 1 tablet by mouth 2 (two) times daily as needed for mild constipation.    Dispense:  180 tablet    Refill:  0    Order Specific Question:   Supervising Provider    Answer:   Doree Albee [[id number]]    Patient to follow-up in 1 month or sooner as needed.  Ailene Ards, NP

## 2020-04-11 LAB — COMPLETE METABOLIC PANEL WITH GFR
AG Ratio: 1.3 (calc) (ref 1.0–2.5)
ALT: 41 U/L (ref 9–46)
AST: 25 U/L (ref 10–35)
Albumin: 3.4 g/dL — ABNORMAL LOW (ref 3.6–5.1)
Alkaline phosphatase (APISO): 83 U/L (ref 35–144)
BUN: 16 mg/dL (ref 7–25)
CO2: 25 mmol/L (ref 20–32)
Calcium: 9 mg/dL (ref 8.6–10.3)
Chloride: 101 mmol/L (ref 98–110)
Creat: 1.07 mg/dL (ref 0.70–1.11)
GFR, Est African American: 75 mL/min/{1.73_m2} (ref >=60)
GFR, Est Non African American: 64 mL/min/{1.73_m2} (ref >=60)
Globulin: 2.7 g/dL (calc) (ref 1.9–3.7)
Glucose, Bld: 142 mg/dL — ABNORMAL HIGH (ref 65–99)
Potassium: 4.3 mmol/L (ref 3.5–5.3)
Sodium: 139 mmol/L (ref 135–146)
Total Bilirubin: 0.9 mg/dL (ref 0.2–1.2)
Total Protein: 6.1 g/dL (ref 6.1–8.1)

## 2020-04-12 ENCOUNTER — Ambulatory Visit (INDEPENDENT_AMBULATORY_CARE_PROVIDER_SITE_OTHER): Payer: Medicare Other

## 2020-04-12 DIAGNOSIS — Z95810 Presence of automatic (implantable) cardiac defibrillator: Secondary | ICD-10-CM | POA: Diagnosis not present

## 2020-04-12 DIAGNOSIS — I5022 Chronic systolic (congestive) heart failure: Secondary | ICD-10-CM

## 2020-04-12 LAB — CUP PACEART REMOTE DEVICE CHECK
Battery Remaining Longevity: 54 mo
Battery Voltage: 2.98 V
Brady Statistic AP VP Percent: 71.16 %
Brady Statistic AP VS Percent: 0.06 %
Brady Statistic AS VP Percent: 28.59 %
Brady Statistic AS VS Percent: 0.19 %
Brady Statistic RA Percent Paced: 71.17 %
Brady Statistic RV Percent Paced: 94.84 %
Date Time Interrogation Session: 20220309033522
HighPow Impedance: 86 Ohm
Implantable Lead Implant Date: 20181130
Implantable Lead Implant Date: 20181130
Implantable Lead Implant Date: 20181130
Implantable Lead Location: 753858
Implantable Lead Location: 753859
Implantable Lead Location: 753860
Implantable Lead Model: 4598
Implantable Lead Model: 5076
Implantable Lead Model: 6935
Implantable Pulse Generator Implant Date: 20181130
Lead Channel Impedance Value: 128.074
Lead Channel Impedance Value: 136.276
Lead Channel Impedance Value: 141.867
Lead Channel Impedance Value: 141.867
Lead Channel Impedance Value: 152 Ohm
Lead Channel Impedance Value: 247 Ohm
Lead Channel Impedance Value: 266 Ohm
Lead Channel Impedance Value: 304 Ohm
Lead Channel Impedance Value: 304 Ohm
Lead Channel Impedance Value: 323 Ohm
Lead Channel Impedance Value: 380 Ohm
Lead Channel Impedance Value: 437 Ohm
Lead Channel Impedance Value: 437 Ohm
Lead Channel Impedance Value: 456 Ohm
Lead Channel Impedance Value: 494 Ohm
Lead Channel Impedance Value: 494 Ohm
Lead Channel Impedance Value: 646 Ohm
Lead Channel Impedance Value: 703 Ohm
Lead Channel Pacing Threshold Amplitude: 0.5 V
Lead Channel Pacing Threshold Amplitude: 0.75 V
Lead Channel Pacing Threshold Amplitude: 0.875 V
Lead Channel Pacing Threshold Pulse Width: 0.4 ms
Lead Channel Pacing Threshold Pulse Width: 0.4 ms
Lead Channel Pacing Threshold Pulse Width: 0.4 ms
Lead Channel Sensing Intrinsic Amplitude: 12.5 mV
Lead Channel Sensing Intrinsic Amplitude: 12.5 mV
Lead Channel Sensing Intrinsic Amplitude: 2.875 mV
Lead Channel Sensing Intrinsic Amplitude: 2.875 mV
Lead Channel Setting Pacing Amplitude: 1.5 V
Lead Channel Setting Pacing Amplitude: 2 V
Lead Channel Setting Pacing Amplitude: 2.5 V
Lead Channel Setting Pacing Pulse Width: 0.4 ms
Lead Channel Setting Pacing Pulse Width: 0.4 ms
Lead Channel Setting Sensing Sensitivity: 0.3 mV

## 2020-04-12 NOTE — Progress Notes (Signed)
EPIC Encounter for ICM Monitoring  Patient Name: Omar Williams is a 85 y.o. male Date: 04/12/2020 Primary Care Physican: Wilson Singer, MD Primary Cardiologist: Wyline Mood Electrophysiologist:Allred Nephrologist:[facility] Bi-V Pacing:99.6% 3/11/2022Weight:161lbs  Spoke withpatientand reports feeling well at this time. Denies fluid symptoms. Patient spent 21 days in the hospital with COVID and is still recovering.  He has 20 pounds and loss of tastes at this time.  He is getting PT at home to help regain strength.   OptivolThoracic impedancesuggestingnormalfluid levels.  Prescribed:No diuretic  Labs: 08/08/2019 Creatinine 1.38, BUN 25, Potassium 4.5, Sodium 141 06/16/2019 Creatinine 1.56, BUN 23, Potassium 5.5, Sodium 143, GFR 41-47 Care Everywhere  Recommendations:No changes and encouraged to call if experiencing any fluid symptoms.  Follow-up plan: ICM clinic phone appointment on4/12/2020. 91 day device clinic remote transmission6/09/2020.   EP/Cardiology Office Visits:07/19/2020 with Dr.Branch.   Copy of ICM check sent to Dr.Allred.   3 month ICM trend: 04/10/2020.    1 Year ICM trend:       Karie Soda, RN 04/12/2020 3:53 PM

## 2020-04-15 DIAGNOSIS — Z7984 Long term (current) use of oral hypoglycemic drugs: Secondary | ICD-10-CM | POA: Diagnosis not present

## 2020-04-15 DIAGNOSIS — E1122 Type 2 diabetes mellitus with diabetic chronic kidney disease: Secondary | ICD-10-CM

## 2020-04-15 DIAGNOSIS — N1832 Chronic kidney disease, stage 3b: Secondary | ICD-10-CM

## 2020-04-15 DIAGNOSIS — I130 Hypertensive heart and chronic kidney disease with heart failure and stage 1 through stage 4 chronic kidney disease, or unspecified chronic kidney disease: Secondary | ICD-10-CM | POA: Diagnosis not present

## 2020-04-15 DIAGNOSIS — U071 COVID-19: Secondary | ICD-10-CM | POA: Diagnosis not present

## 2020-04-15 DIAGNOSIS — J1281 Pneumonia due to SARS-associated coronavirus: Secondary | ICD-10-CM | POA: Diagnosis not present

## 2020-04-15 DIAGNOSIS — I2510 Atherosclerotic heart disease of native coronary artery without angina pectoris: Secondary | ICD-10-CM

## 2020-04-15 DIAGNOSIS — I5023 Acute on chronic systolic (congestive) heart failure: Secondary | ICD-10-CM

## 2020-04-15 DIAGNOSIS — Z9981 Dependence on supplemental oxygen: Secondary | ICD-10-CM

## 2020-04-15 DIAGNOSIS — I255 Ischemic cardiomyopathy: Secondary | ICD-10-CM | POA: Diagnosis not present

## 2020-04-18 NOTE — Progress Notes (Signed)
Remote ICD transmission.   

## 2020-04-20 ENCOUNTER — Other Ambulatory Visit (INDEPENDENT_AMBULATORY_CARE_PROVIDER_SITE_OTHER): Payer: Self-pay | Admitting: Nurse Practitioner

## 2020-04-20 DIAGNOSIS — E785 Hyperlipidemia, unspecified: Secondary | ICD-10-CM

## 2020-04-22 ENCOUNTER — Telehealth (INDEPENDENT_AMBULATORY_CARE_PROVIDER_SITE_OTHER): Payer: Self-pay

## 2020-04-22 ENCOUNTER — Other Ambulatory Visit (INDEPENDENT_AMBULATORY_CARE_PROVIDER_SITE_OTHER): Payer: Self-pay | Admitting: Internal Medicine

## 2020-04-22 NOTE — Telephone Encounter (Signed)
Patients granddaughter Harriette Ohara called and left a detailed voice message that the patient needs a refill on his diabetic test strips and needs to have testing 2 times daily since he has been sick with Covid and is on steroids and his blood sugars have been fluctuating. The pharmacy will not fill his current Rx with the instructions as not due to fill yet.  Can you send in an updated refill for him to test 2 times daily with increased quantity of strips?  ONETOUCH ULTRA test strip  Last filled 02/15/2020, # 100 with 2 refills

## 2020-04-22 NOTE — Telephone Encounter (Signed)
Unfortunately, if he is not taking insulin, which he is not, his insurance is not going to pay for this to test his blood sugar level twice a day.  I would recommend that he does check his sugar once a day but at different times of the day to get a better idea of what the fluctuating sugars are all about.  If he is concerned, we can certainly see him for an appointment.

## 2020-04-22 NOTE — Telephone Encounter (Signed)
Called patient and LMOVM to return call  Called and left a detailed voice message for patient to call back to discuss. I do not see a DPR filled out for patients granddaughter to discuss and will check with older system to see if updated in there.

## 2020-04-22 NOTE — Telephone Encounter (Signed)
Patient called back and I gave him the message. Patient verbalized an understanding.

## 2020-04-22 NOTE — Telephone Encounter (Signed)
Called patients granddaughter [name] back at [phone] and left a detailed voice message to let her know that unfortunately she is not on the DPR and I am not able to discuss information with her and to please have patient call to discuss.

## 2020-04-23 DIAGNOSIS — U071 COVID-19: Secondary | ICD-10-CM | POA: Diagnosis not present

## 2020-04-23 DIAGNOSIS — E1122 Type 2 diabetes mellitus with diabetic chronic kidney disease: Secondary | ICD-10-CM | POA: Diagnosis not present

## 2020-04-23 DIAGNOSIS — I255 Ischemic cardiomyopathy: Secondary | ICD-10-CM | POA: Diagnosis not present

## 2020-04-23 DIAGNOSIS — I130 Hypertensive heart and chronic kidney disease with heart failure and stage 1 through stage 4 chronic kidney disease, or unspecified chronic kidney disease: Secondary | ICD-10-CM | POA: Diagnosis not present

## 2020-04-23 DIAGNOSIS — J1281 Pneumonia due to SARS-associated coronavirus: Secondary | ICD-10-CM | POA: Diagnosis not present

## 2020-04-23 DIAGNOSIS — I5023 Acute on chronic systolic (congestive) heart failure: Secondary | ICD-10-CM | POA: Diagnosis not present

## 2020-05-02 ENCOUNTER — Telehealth: Payer: Self-pay | Admitting: Cardiology

## 2020-05-02 ENCOUNTER — Telehealth (INDEPENDENT_AMBULATORY_CARE_PROVIDER_SITE_OTHER): Payer: Self-pay

## 2020-05-02 ENCOUNTER — Other Ambulatory Visit (INDEPENDENT_AMBULATORY_CARE_PROVIDER_SITE_OTHER): Payer: Self-pay | Admitting: Internal Medicine

## 2020-05-02 DIAGNOSIS — I255 Ischemic cardiomyopathy: Secondary | ICD-10-CM | POA: Diagnosis not present

## 2020-05-02 DIAGNOSIS — J1281 Pneumonia due to SARS-associated coronavirus: Secondary | ICD-10-CM | POA: Diagnosis not present

## 2020-05-02 DIAGNOSIS — Z7984 Long term (current) use of oral hypoglycemic drugs: Secondary | ICD-10-CM | POA: Diagnosis not present

## 2020-05-02 DIAGNOSIS — E1122 Type 2 diabetes mellitus with diabetic chronic kidney disease: Secondary | ICD-10-CM | POA: Diagnosis not present

## 2020-05-02 DIAGNOSIS — U071 COVID-19: Secondary | ICD-10-CM | POA: Diagnosis not present

## 2020-05-02 DIAGNOSIS — Z7901 Long term (current) use of anticoagulants: Secondary | ICD-10-CM | POA: Diagnosis not present

## 2020-05-02 DIAGNOSIS — I130 Hypertensive heart and chronic kidney disease with heart failure and stage 1 through stage 4 chronic kidney disease, or unspecified chronic kidney disease: Secondary | ICD-10-CM | POA: Diagnosis not present

## 2020-05-02 DIAGNOSIS — Z9981 Dependence on supplemental oxygen: Secondary | ICD-10-CM | POA: Diagnosis not present

## 2020-05-02 DIAGNOSIS — I5023 Acute on chronic systolic (congestive) heart failure: Secondary | ICD-10-CM | POA: Diagnosis not present

## 2020-05-02 DIAGNOSIS — N1832 Chronic kidney disease, stage 3b: Secondary | ICD-10-CM | POA: Diagnosis not present

## 2020-05-02 DIAGNOSIS — I2510 Atherosclerotic heart disease of native coronary artery without angina pectoris: Secondary | ICD-10-CM | POA: Diagnosis not present

## 2020-05-02 NOTE — Telephone Encounter (Signed)
New message     Does he need to continue taking the Eliquis?   If he needs to continue taking it please call into [facility] in [place]

## 2020-05-02 NOTE — Telephone Encounter (Signed)
I cannot find a good reason as to why he should continue with Eliquis.  I think he needs to call his cardiologist and ask them.

## 2020-05-02 NOTE — Telephone Encounter (Signed)
I don't see where we started him on Eliquis or indication - should he be on this ?

## 2020-05-02 NOTE — Telephone Encounter (Signed)
Called patient and gave him the message from Dr. Karilyn Cota. Patient verbalized an understanding and stated that he will check with his cardiologist.

## 2020-05-02 NOTE — Telephone Encounter (Signed)
Patient called and stated that he took his last Eliquis yesterday and was not sure if he is suppose to stay on this? If so can you send a refill to [facility] [place]?  apixaban (ELIQUIS) 5 MG TABS tablet  Sent in by hospital  Last OV 04/10/2020  Next OV 05/09/2020

## 2020-05-03 NOTE — Telephone Encounter (Signed)
Pt voiced understanding - updated medication list 

## 2020-05-03 NOTE — Telephone Encounter (Signed)
Can stop eliquis. 03/2020 [facility] discharge reviewed, appears anticoag started emperically for COVID. I see nothong about a PE/DVT or afib indication, there d/c summary says discarhge with 30 days of eliquis. Does not need to continue   Dominga Ferry MD

## 2020-05-07 DIAGNOSIS — I130 Hypertensive heart and chronic kidney disease with heart failure and stage 1 through stage 4 chronic kidney disease, or unspecified chronic kidney disease: Secondary | ICD-10-CM | POA: Diagnosis not present

## 2020-05-07 DIAGNOSIS — I5023 Acute on chronic systolic (congestive) heart failure: Secondary | ICD-10-CM | POA: Diagnosis not present

## 2020-05-07 DIAGNOSIS — I255 Ischemic cardiomyopathy: Secondary | ICD-10-CM | POA: Diagnosis not present

## 2020-05-07 DIAGNOSIS — E1122 Type 2 diabetes mellitus with diabetic chronic kidney disease: Secondary | ICD-10-CM | POA: Diagnosis not present

## 2020-05-07 DIAGNOSIS — U071 COVID-19: Secondary | ICD-10-CM | POA: Diagnosis not present

## 2020-05-07 DIAGNOSIS — J1281 Pneumonia due to SARS-associated coronavirus: Secondary | ICD-10-CM | POA: Diagnosis not present

## 2020-05-09 ENCOUNTER — Other Ambulatory Visit (INDEPENDENT_AMBULATORY_CARE_PROVIDER_SITE_OTHER): Payer: Self-pay | Admitting: Internal Medicine

## 2020-05-09 ENCOUNTER — Ambulatory Visit (INDEPENDENT_AMBULATORY_CARE_PROVIDER_SITE_OTHER): Payer: Medicare Other | Admitting: Nurse Practitioner

## 2020-05-09 ENCOUNTER — Other Ambulatory Visit: Payer: Self-pay

## 2020-05-09 VITALS — BP 112/62 | HR 85 | Temp 96.4°F | Ht 69.0 in | Wt 160.6 lb

## 2020-05-09 DIAGNOSIS — J1282 Pneumonia due to coronavirus disease 2019: Secondary | ICD-10-CM

## 2020-05-09 DIAGNOSIS — E559 Vitamin D deficiency, unspecified: Secondary | ICD-10-CM | POA: Diagnosis not present

## 2020-05-09 DIAGNOSIS — U071 COVID-19: Secondary | ICD-10-CM | POA: Diagnosis not present

## 2020-05-09 DIAGNOSIS — E1122 Type 2 diabetes mellitus with diabetic chronic kidney disease: Secondary | ICD-10-CM

## 2020-05-09 DIAGNOSIS — N183 Chronic kidney disease, stage 3 unspecified: Secondary | ICD-10-CM

## 2020-05-09 NOTE — Progress Notes (Signed)
Subjective:  Patient ID: Omar Williams, male    DOB: [birth year]  Age: 85 y.o. MRN: [id number]  CC:  Chief Complaint  Patient presents with  . Covid19  . Diabetes  . Other    Vitamin D deficiency      HPI  This patient arrives today for the above.  COVID-19 pneumonia: He is here today to perform walking status in the office to see if he can come off of his chronic oxygen.  He has been working with physical therapy and occupational therapy and tells me that he feels his strength is much improved.  He tells me he was doing exercises with his therapist today without oxygen and they did monitor his oxygen saturations and for the most part they remained above 90%.  Diabetes: He continues on glimepiride for treatment of his diabetes.  Last A1c was 7.8 this was collected about 4 months ago.  Goal for his A1c is less than 8.  Vitamin D deficiency: Last serum check was collected about a year ago.  He continues on 5000 IUs daily.  Past Medical History:  Diagnosis Date  . Acid reflux   . Blood transfusion without reported diagnosis   . Cardiac arrest ([place])    a. occuring in 12/2016. LAD disease but no targets for CABG or PCI. Underwent ICD placement as EF 15-20%.   . CHF (congestive heart failure) ([place])   . Coronary artery disease   . Diabetes mellitus without complication ([place])   . Hyperlipidemia   . Hypertension   . Myocardial infarction ([place])   . Renal disorder       Family History  Problem Relation Age of Onset  . Hypertension Mother   . Hyperlipidemia Mother   . CAD Sister   . Diabetes Sister   . Hyperlipidemia Sister   . Hypertension Sister   . CAD Brother   . Diabetes Brother   . Hypertension Brother   . Hyperlipidemia Brother   . CAD Brother   . Diabetes Brother   . Hypertension Brother   . Hyperlipidemia Brother   . Dementia Brother   . Thyroid disease Daughter   . Hypertension Daughter   . Diabetes Daughter   . Hyperlipidemia Daughter   . Fibromyalgia  Daughter   . Heart disease Daughter        stents  . Multiple sclerosis Daughter   . Thyroid disease Daughter     Social History   Social History Narrative   Lives with with wife [name]   Married 3 years   Daughter [name] stays frequently   Granddaughter [name] near by   Social History   Tobacco Use  . Smoking status: Former Smoker    Types: Cigarettes    Quit date: 1960    Years since quitting: 62.3  . Smokeless tobacco: Never Used  . Tobacco comment: 2-3 years as a teenager  Substance Use Topics  . Alcohol use: No     No outpatient medications have been marked as taking for the 05/09/20 encounter (Office Visit) with Ailene Ards, NP.    ROS:  Review of Systems  Respiratory: Negative for shortness of breath.   Cardiovascular: Negative for chest pain.     Objective:   Today's Vitals: BP 112/62   Pulse 85   Temp (!) 96.4 F (35.8 C)   Ht '5\' 9"'  (1.753 m)   Wt 160 lb 9.6 oz (72.8 kg)   SpO2 94%   BMI 23.72  kg/m  Vitals with BMI 05/09/2020 04/10/2020 01/11/2020  Height '5\' 9"'  '5\' 7"'  '5\' 7"'   Weight 160 lbs 10 oz 161 lbs 10 oz 178 lbs 13 oz  BMI 73.22 02.5 28  Systolic 427 062 376  Diastolic 62 70 72  Pulse 85 73 63     Physical Exam Vitals reviewed.  Constitutional:      Appearance: Normal appearance.  HENT:     Head: Normocephalic and atraumatic.  Cardiovascular:     Rate and Rhythm: Normal rate and regular rhythm.  Pulmonary:     Effort: Pulmonary effort is normal.     Breath sounds: Normal breath sounds.  Musculoskeletal:     Cervical back: Neck supple.  Skin:    General: Skin is warm and dry.  Neurological:     Mental Status: He is alert and oriented to person, place, and time.  Psychiatric:        Mood and Affect: Mood normal.        Behavior: Behavior normal.        Thought Content: Thought content normal.        Judgment: Judgment normal.       Walking saturations are reformed up and down the hallway today, oxygen saturation remained at  91% or above on room air.   Assessment and Plan   1. Vitamin D deficiency   2. Type 2 diabetes mellitus with stage 3 chronic kidney disease, without long-term current use of insulin, unspecified whether stage 3a or 3b CKD ([place])   3. Pneumonia due to COVID-19 virus      Plan: 1.  We will check serum vitamin D level today for further evaluation. 2.  We will check A1c for further evaluation today. 3.  Walking saturations indicated that his oxygen remained above 90% while ambulating on room air.  At this point I think he can discontinue using his oxygen.   Tests ordered Orders Placed This Encounter  Procedures  . Hemoglobin A1c  . CMP with eGFR([facility])  . Vitamin D, 25-hydroxy      No orders of the defined types were placed in this encounter.   Patient to follow-up as scheduled next month or sooner as needed.  Ailene Ards, NP

## 2020-05-10 LAB — COMPLETE METABOLIC PANEL WITH GFR
AG Ratio: 1.2 (calc) (ref 1.0–2.5)
ALT: 18 U/L (ref 9–46)
AST: 23 U/L (ref 10–35)
Albumin: 3.9 g/dL (ref 3.6–5.1)
Alkaline phosphatase (APISO): 76 U/L (ref 35–144)
BUN: 17 mg/dL (ref 7–25)
CO2: 21 mmol/L (ref 20–32)
Calcium: 9.5 mg/dL (ref 8.6–10.3)
Chloride: 104 mmol/L (ref 98–110)
Creat: 1.01 mg/dL (ref 0.70–1.11)
GFR, Est African American: 80 mL/min/{1.73_m2} (ref >=60)
GFR, Est Non African American: 69 mL/min/{1.73_m2} (ref >=60)
Globulin: 3.3 g/dL (calc) (ref 1.9–3.7)
Glucose, Bld: 81 mg/dL (ref 65–139)
Potassium: 4 mmol/L (ref 3.5–5.3)
Sodium: 138 mmol/L (ref 135–146)
Total Bilirubin: 0.9 mg/dL (ref 0.2–1.2)
Total Protein: 7.2 g/dL (ref 6.1–8.1)

## 2020-05-10 LAB — HEMOGLOBIN A1C
Hgb A1c MFr Bld: 7.4 % of total Hgb — ABNORMAL HIGH (ref <5.7)
Mean Plasma Glucose: 166 mg/dL
eAG (mmol/L): 9.2 mmol/L

## 2020-05-10 LAB — VITAMIN D 25 HYDROXY (VIT D DEFICIENCY, FRACTURES): Vit D, 25-Hydroxy: 81 ng/mL (ref 30–100)

## 2020-05-13 ENCOUNTER — Ambulatory Visit (INDEPENDENT_AMBULATORY_CARE_PROVIDER_SITE_OTHER): Payer: Medicare Other

## 2020-05-13 DIAGNOSIS — I5022 Chronic systolic (congestive) heart failure: Secondary | ICD-10-CM | POA: Diagnosis not present

## 2020-05-13 DIAGNOSIS — Z95810 Presence of automatic (implantable) cardiac defibrillator: Secondary | ICD-10-CM

## 2020-05-13 NOTE — Progress Notes (Signed)
EPIC Encounter for ICM Monitoring  Patient Name: Omar Williams is a 85 y.o. male Date: 05/13/2020 Primary Care Physican: Wilson Singer, MD Primary Cardiologist: Branch Electrophysiologist:Allred Nephrologist:[facility] Bi-V Pacing:98.9% 4/11/2022Weight:160lbs  Spoke with patient and reports swelling of feet in the last 2 weeks but has resolved.  He currently is not experiencing any fluid symptoms.  He has been eating restaurant foods several times a week since hospital discharged in March.  Also eating foods high in salt at home as well such as bologna, bacon, and drinks containing salt.   OptivolThoracic impedancesuggestingpossible fluid accumulation starting 04/15/2020.  Prescribed:No diuretic  Labs: 05/09/2020 Creatinine 1.01, BUN 17, Potassium 4.0, Sodium 138, GFR 69-80  04/10/2020 Creatinine 1.07, BUN 16, Potassium 4.3, Sodium 139, GFR 64-75 03/30/2020 Creatinine 1.02, BUN 24, Potassium 4.2, Sodium 133, GFR 68-79  03/29/2020 Creatinine 1.24, BUN 28, Potassium 4.4, Sodium 135, GFR 54-62 Care Everywhere  A complete set of results can be found in Results Review.  Recommendations:Discussed at length regarding diet and how encouraged to read food/drink labels for salt content.  Recommendation to limit salt intake to 2000 mg daily and fluid intake to 64 oz daily.  Copy sent to Dr Wyline Mood for review and recommendations if needed.  Follow-up plan: ICM clinic phone appointment on4/15/2022 to recheck fluid levels. 91 day device clinic remote transmission6/09/2020.   EP/Cardiology Office Visits:6/17/2022with Dr.Branch.   Copy of ICM check sent to Dr.Allred and Dr Wyline Mood for review and recommendations if needed.   3 month ICM trend: 05/13/2020.    1 Year ICM trend:       Karie Soda, RN 05/13/2020 10:41 AM

## 2020-05-15 ENCOUNTER — Encounter (INDEPENDENT_AMBULATORY_CARE_PROVIDER_SITE_OTHER): Payer: Self-pay | Admitting: Nurse Practitioner

## 2020-05-15 ENCOUNTER — Telehealth (INDEPENDENT_AMBULATORY_CARE_PROVIDER_SITE_OTHER): Payer: Self-pay

## 2020-05-15 NOTE — Telephone Encounter (Signed)
I have written and printed a letter.  I will place it in the to be faxed folder, please fax this whenever you have time.  Thank you.

## 2020-05-15 NOTE — Telephone Encounter (Signed)
Patient called and stated that he wants to have them come pick up his oxygen equipment since Maralyn Sago stated that he is doing okay now and patient stated that release letter needs to be faxed to [employer] at [phone] and their telephone number is also [phone].  Please advise.

## 2020-05-15 NOTE — Telephone Encounter (Signed)
Faxed letter to [employer] and received a fax confirmation that the fax went through.

## 2020-05-16 MED ORDER — FUROSEMIDE 20 MG PO TABS: ORAL_TABLET | ORAL | 1 refills | Status: DC

## 2020-05-16 NOTE — Progress Notes (Signed)
Thanks for update, Omar Williams could we start this patient on lasix 20mg  prn swelling  MD

## 2020-05-16 NOTE — Progress Notes (Signed)
Pt aware and lasix sent to pharmacy  

## 2020-05-16 NOTE — Telephone Encounter (Signed)
Called patient and let him know that the fax was sent yesterday and if he does not hear from [employer] to pick up oxygen equipment to please let [place] know. Wished patient a Happy Birthday. Patient verbalized an understanding.

## 2020-05-17 DIAGNOSIS — E1122 Type 2 diabetes mellitus with diabetic chronic kidney disease: Secondary | ICD-10-CM | POA: Diagnosis not present

## 2020-05-17 DIAGNOSIS — I129 Hypertensive chronic kidney disease with stage 1 through stage 4 chronic kidney disease, or unspecified chronic kidney disease: Secondary | ICD-10-CM | POA: Diagnosis not present

## 2020-05-17 DIAGNOSIS — N189 Chronic kidney disease, unspecified: Secondary | ICD-10-CM | POA: Diagnosis not present

## 2020-05-17 DIAGNOSIS — E1129 Type 2 diabetes mellitus with other diabetic kidney complication: Secondary | ICD-10-CM | POA: Diagnosis not present

## 2020-05-17 DIAGNOSIS — E875 Hyperkalemia: Secondary | ICD-10-CM | POA: Diagnosis not present

## 2020-05-17 DIAGNOSIS — R809 Proteinuria, unspecified: Secondary | ICD-10-CM | POA: Diagnosis not present

## 2020-05-17 NOTE — Progress Notes (Signed)
Patient returned call.  He reported taking PRN Furosemide today and will for the next 2 days. He reports leg swelling is decreasing with Furosemide.  Advised will recheck fluid levels on 05/21/2020.  He agreed with plan.

## 2020-05-17 NOTE — Progress Notes (Signed)
Attempted call to patient to discuss rescheduling of remote transmission.  Will reschedule ICM remote transmission from 05/17/2020 to 05/21/2020 to allow time to check effectiveness of PRN Furosemide.

## 2020-05-21 DIAGNOSIS — I5023 Acute on chronic systolic (congestive) heart failure: Secondary | ICD-10-CM | POA: Diagnosis not present

## 2020-05-21 DIAGNOSIS — J1281 Pneumonia due to SARS-associated coronavirus: Secondary | ICD-10-CM | POA: Diagnosis not present

## 2020-05-21 DIAGNOSIS — I130 Hypertensive heart and chronic kidney disease with heart failure and stage 1 through stage 4 chronic kidney disease, or unspecified chronic kidney disease: Secondary | ICD-10-CM | POA: Diagnosis not present

## 2020-05-21 DIAGNOSIS — E1122 Type 2 diabetes mellitus with diabetic chronic kidney disease: Secondary | ICD-10-CM | POA: Diagnosis not present

## 2020-05-21 DIAGNOSIS — I255 Ischemic cardiomyopathy: Secondary | ICD-10-CM | POA: Diagnosis not present

## 2020-05-21 DIAGNOSIS — U071 COVID-19: Secondary | ICD-10-CM | POA: Diagnosis not present

## 2020-05-27 DIAGNOSIS — E1122 Type 2 diabetes mellitus with diabetic chronic kidney disease: Secondary | ICD-10-CM | POA: Diagnosis not present

## 2020-05-27 DIAGNOSIS — I130 Hypertensive heart and chronic kidney disease with heart failure and stage 1 through stage 4 chronic kidney disease, or unspecified chronic kidney disease: Secondary | ICD-10-CM | POA: Diagnosis not present

## 2020-05-27 DIAGNOSIS — J1281 Pneumonia due to SARS-associated coronavirus: Secondary | ICD-10-CM | POA: Diagnosis not present

## 2020-05-27 DIAGNOSIS — U071 COVID-19: Secondary | ICD-10-CM | POA: Diagnosis not present

## 2020-05-27 DIAGNOSIS — I5023 Acute on chronic systolic (congestive) heart failure: Secondary | ICD-10-CM | POA: Diagnosis not present

## 2020-05-27 DIAGNOSIS — I255 Ischemic cardiomyopathy: Secondary | ICD-10-CM | POA: Diagnosis not present

## 2020-05-31 ENCOUNTER — Ambulatory Visit (INDEPENDENT_AMBULATORY_CARE_PROVIDER_SITE_OTHER): Payer: Medicare Other

## 2020-05-31 ENCOUNTER — Telehealth: Payer: Self-pay

## 2020-05-31 DIAGNOSIS — I5022 Chronic systolic (congestive) heart failure: Secondary | ICD-10-CM

## 2020-05-31 DIAGNOSIS — Z95810 Presence of automatic (implantable) cardiac defibrillator: Secondary | ICD-10-CM

## 2020-05-31 NOTE — Telephone Encounter (Signed)
Received patient call to inquire if remote transmission fluid levels have improved.  Attempted to assist with sending remote transmission and received error code of 3230.  Provided [employer] support number and advised to call for assistance.  Rescheduled ICM remote transmission for 06/10/2020.  Patient has been taking PRN Furosemide everyday.  Advised if he no longer has any fluid symptoms to only use as needed.  He verbalized understanding.

## 2020-05-31 NOTE — Progress Notes (Signed)
EPIC Encounter for ICM Monitoring  Patient Name: Omar Williams is a 85 y.o. male Date: 05/31/2020 Primary Care Physican: Wilson Singer, MD Primary Cardiologist: Wyline Mood Electrophysiologist:Allred Nephrologist:[facility] Bi-V Pacing:98.6% 4/11/2022Weight:160lbs  Spoke with patient and reports swelling of feet have resolved.  He is feeling fine.   OptivolThoracic impedancesuggestingfluid levels returned to normal after taking PRN Furosemide.  Prescribed: Furosemide 20 mg take 1 tablet as needed for swelling.  Labs: 05/09/2020 Creatinine 1.01, BUN 17, Potassium 4.0, Sodium 138, GFR 69-80  04/10/2020 Creatinine 1.07, BUN 16, Potassium 4.3, Sodium 139, GFR 64-75 03/30/2020 Creatinine 1.02, BUN 24, Potassium 4.2, Sodium 133, GFR 68-79  03/29/2020 Creatinine 1.24, BUN 28, Potassium 4.4, Sodium 135, GFR 54-62 Care Everywhere  A complete set of results can be found in Results Review.  Recommendations:  Advised to take PRN Furosemide if symptoms recur and limit salt intake.  Follow-up plan: ICM clinic phone appointment on5/31/2022. 91 day device clinic remote transmission6/09/2020.   EP/Cardiology Office Visits:6/17/2022with Dr.Branch.   Copy of ICM check sent to Dr.Allred   3 month ICM trend: 05/31/2020.    1 Year ICM trend:       Karie Soda, RN 05/31/2020 2:12 PM

## 2020-06-05 DIAGNOSIS — I129 Hypertensive chronic kidney disease with stage 1 through stage 4 chronic kidney disease, or unspecified chronic kidney disease: Secondary | ICD-10-CM | POA: Diagnosis not present

## 2020-06-05 DIAGNOSIS — E1122 Type 2 diabetes mellitus with diabetic chronic kidney disease: Secondary | ICD-10-CM | POA: Diagnosis not present

## 2020-06-05 DIAGNOSIS — R809 Proteinuria, unspecified: Secondary | ICD-10-CM | POA: Diagnosis not present

## 2020-06-05 DIAGNOSIS — N189 Chronic kidney disease, unspecified: Secondary | ICD-10-CM | POA: Diagnosis not present

## 2020-06-05 DIAGNOSIS — E1129 Type 2 diabetes mellitus with other diabetic kidney complication: Secondary | ICD-10-CM | POA: Diagnosis not present

## 2020-06-05 DIAGNOSIS — E872 Acidosis: Secondary | ICD-10-CM | POA: Diagnosis not present

## 2020-06-10 ENCOUNTER — Ambulatory Visit (INDEPENDENT_AMBULATORY_CARE_PROVIDER_SITE_OTHER): Payer: Medicare Other | Admitting: Nurse Practitioner

## 2020-06-12 ENCOUNTER — Ambulatory Visit (INDEPENDENT_AMBULATORY_CARE_PROVIDER_SITE_OTHER): Payer: Medicare Other | Admitting: Nurse Practitioner

## 2020-06-12 ENCOUNTER — Other Ambulatory Visit: Payer: Self-pay

## 2020-06-12 ENCOUNTER — Encounter (INDEPENDENT_AMBULATORY_CARE_PROVIDER_SITE_OTHER): Payer: Self-pay | Admitting: Nurse Practitioner

## 2020-06-12 VITALS — BP 116/66 | HR 79 | Temp 97.3°F | Ht 64.5 in | Wt 156.6 lb

## 2020-06-12 DIAGNOSIS — Z0000 Encounter for general adult medical examination without abnormal findings: Secondary | ICD-10-CM | POA: Diagnosis not present

## 2020-06-12 NOTE — Patient Instructions (Signed)
  [name] , Thank you for taking time to come for your Medicare Wellness Visit. I appreciate your ongoing commitment to your health goals. Please review the following plan we discussed and let me know if I can assist you in the future.   These are the goals we discussed: Goals    . HEMOGLOBIN A1C < 7.0    . LDL CALC < 70       This is a list of the screening recommended for you and due dates:  Health Maintenance  Topic Date Due  . Complete foot exam   02/23/2018  . Eye exam for diabetics  09/17/2019  . COVID-19 Vaccine (1) 06/28/2020*  . Flu Shot  09/02/2020  . Hemoglobin A1C  11/08/2020  . Tetanus Vaccine  09/12/2028  . Pneumonia vaccines  Completed  . HPV Vaccine  Aged Out  *Topic was postponed. The date shown is not the original due date.

## 2020-06-12 NOTE — Progress Notes (Signed)
  Subjective:   Omar Williams is a 85 y.o. male who presents for Medicare Annual/Subsequent preventive examination.  Review of Systems     Cardiac Risk Factors include: advanced age (>55men, >65 women);diabetes mellitus;dyslipidemia;male gender;hypertension     Objective:    Today's Vitals   06/12/20 1302  BP: 116/66  Pulse: 79  Temp: (!) 97.3 F (36.3 C)  TempSrc: Temporal  SpO2: 95%  Weight: 156 lb 9.6 oz (71 kg)  Height: 5' 4.5" (1.638 m)   Body mass index is 26.47 kg/m.  Advanced Directives 06/12/2020 06/02/2019 01/25/2017 12/30/2016  Does Patient Have a Medical Advance Directive? No No Yes No  Type of Advance Directive - - Healthcare Power of Attorney -  Would patient like information on creating a medical advance directive? No - Patient declined No - Patient declined - No - Patient declined    Current Medications (verified) Outpatient Encounter Medications as of 06/12/2020  Medication Sig  . ascorbic acid (VITAMIN C) 1000 MG tablet Take 1 tablet by mouth daily.  . aspirin EC 81 MG tablet Take 81 mg by mouth daily.  . atorvastatin (LIPITOR) 80 MG tablet TAKE 1 TABLET BY MOUTH EVERYDAY AT BEDTIME  . Bee Pollen 550 MG CAPS Take 1 capsule by mouth 2 (two) times daily.   . blood glucose meter kit and supplies KIT Test daily.  Dx e11.22  . chlorhexidine (PERIDEX) 0.12 % solution SMARTSIG:By Mouth  . Cholecalciferol (VITAMIN D3) 125 MCG (5000 UT) TABS Take 1 tablet by mouth daily.   . CHROMIUM-CINNAMON PO Take 2,000 mg by mouth in the morning and at bedtime.  . ezetimibe (ZETIA) 10 MG tablet TAKE 1 TABLET BY MOUTH EVERY DAY  . FIBER ADULT GUMMIES PO Take 2 tablets by mouth at bedtime.  . finasteride (PROSCAR) 5 MG tablet Take 1 tablet (5 mg total) by mouth at bedtime.  . furosemide (LASIX) 20 MG tablet TAKE 1 TABLET DAILY AS NEEDED FOR SWELLING  . glimepiride (AMARYL) 4 MG tablet TAKE 1 TABLET (4 MG TOTAL) BY MOUTH IN THE MORNING AND AT BEDTIME.  . nitroGLYCERIN  (NITROSTAT) 0.4 MG SL tablet Place 1 tablet (0.4 mg total) under the tongue every 5 (five) minutes x 3 doses as needed for chest pain.  . Omega-3 Fatty Acids (FISH OIL) 1200 MG CAPS Take 1 capsule by mouth 2 (two) times daily.   . ONETOUCH ULTRA test strip TEST ONE TIME DAILY E11.9  . oxybutynin (DITROPAN) 5 MG tablet Take 0.5 tablets (2.5 mg total) by mouth 3 (three) times daily.  . senna-docusate (SENOKOT-S) 8.6-50 MG tablet Take 1 tablet by mouth 2 (two) times daily as needed for mild constipation.  . tamsulosin (FLOMAX) 0.4 MG CAPS capsule Take 1 capsule (0.4 mg total) by mouth every morning.  . vitamin B-12 (CYANOCOBALAMIN) 1000 MCG tablet Take 1,000 mcg by mouth every morning.   . carvedilol (COREG) 6.25 MG tablet TAKE 1 TABLET BY MOUTH TWICE A DAY (Patient not taking: Reported on 06/12/2020)  . losartan (COZAAR) 25 MG tablet TAKE 1 TABLET BY MOUTH TWICE A DAY (Patient not taking: Reported on 06/12/2020)   No facility-administered encounter medications on file as of 06/12/2020.    Allergies (verified) Patient has no known allergies.   History: Past Medical History:  Diagnosis Date  . Acid reflux   . Blood transfusion without reported diagnosis   . Cardiac arrest ([facility])    a. occuring in 12/2016. LAD disease but no targets for CABG or PCI. Underwent   ICD placement as EF 15-20%.   . CHF (congestive heart failure) ([facility])   . Coronary artery disease   . Diabetes mellitus without complication ([facility])   . Hyperlipidemia   . Hypertension   . Myocardial infarction ([facility])   . Renal disorder    Past Surgical History:  Procedure Laterality Date  . BIV ICD INSERTION CRT-D N/A 01/01/2017   Procedure: BIV ICD INSERTION CRT-D;  Surgeon: Camnitz, Will Martin, MD;  Location: [facility] INVASIVE CV LAB;  Service: Cardiovascular;  Laterality: N/A;   Family History  Problem Relation Age of Onset  . Hypertension Mother   . Hyperlipidemia Mother   . CAD Sister   . Diabetes Sister   . Hyperlipidemia Sister    . Hypertension Sister   . CAD Brother   . Diabetes Brother   . Hypertension Brother   . Hyperlipidemia Brother   . CAD Brother   . Diabetes Brother   . Hypertension Brother   . Hyperlipidemia Brother   . Dementia Brother   . Thyroid disease Daughter   . Hypertension Daughter   . Diabetes Daughter   . Hyperlipidemia Daughter   . Fibromyalgia Daughter   . Heart disease Daughter        stents  . Multiple sclerosis Daughter   . Thyroid disease Daughter    Social History   Socioeconomic History  . Marital status: Married    Spouse name: johnna  . Number of children: 3  . Years of education: 12  . Highest education level: Some college, no degree  Occupational History  . Occupation: apartment manager   retired  . Occupation: pastor  Tobacco Use  . Smoking status: Former Smoker    Types: Cigarettes    Quit date: 1960    Years since quitting: 62.4  . Smokeless tobacco: Never Used  . Tobacco comment: 2-3 years as a teenager  Vaping Use  . Vaping Use: Never used  Substance and Sexual Activity  . Alcohol use: No  . Drug use: No  . Sexual activity: Never  Other Topics Concern  . Not on file  Social History Narrative   Lives with with wife [name]   Married 60 years   Daughter Betty Jo stays frequently   Granddaughter [name] near by   Social Determinants of Health   Financial Resource Strain: Not on file  Food Insecurity: Not on file  Transportation Needs: Not on file  Physical Activity: Not on file  Stress: Not on file  Social Connections: Not on file    Tobacco Counseling Counseling given: Yes Comment: 2-3 years as a teenager   Clinical Intake:  Pre-visit preparation completed: Yes  Pain : No/denies pain     BMI - recorded: 26.47 Nutritional Status: BMI 25 -29 Overweight Nutritional Risks: None Diabetes: Yes CBG done?: No (Home FBG 174) Did pt. bring in CBG monitor from home?: No  How often do you need to have someone help you when you read  instructions, pamphlets, or other written materials from your doctor or pharmacy?: 1 - Never What is the last grade level you completed in school?: GED  Diabetic? Yes  Interpreter Needed?: No  Information entered by ::  , NP-C   Activities of Daily Living In your present state of health, do you have any difficulty performing the following activities: 06/12/2020  Hearing? Y  Vision? N  Difficulty concentrating or making decisions? N  Walking or climbing stairs? Y  Dressing or bathing? N  Doing errands, shopping?   N  Preparing Food and eating ? N  Using the Toilet? N  In the past six months, have you accidently leaked urine? N  Do you have problems with loss of bowel control? N  Managing your Medications? N  Managing your Finances? N  Housekeeping or managing your Housekeeping? N  Some recent data might be hidden    Patient Care Team: Doree Albee, MD as PCP - General (Internal Medicine) Thompson Grayer, MD as PCP - Electrophysiology (Cardiology) Harl Bowie Alphonse Guild, MD as PCP - Cardiology (Cardiology)  Indicate any recent Medical Services you may have received from other than [facility] providers in the past year (date may be approximate).     Assessment:   This is a routine wellness examination for Nyshawn.  Hearing/Vision screen No exam data present  Dietary issues and exercise activities discussed: Current Exercise Habits: Home exercise routine, Type of exercise: strength training/weights, Time (Minutes): 10, Frequency (Times/Week): 7, Weekly Exercise (Minutes/Week): 70, Intensity: Mild, Exercise limited by: orthopedic condition(s);respiratory conditions(s)  Goals Addressed   None    Depression Screen PHQ 2/9 Scores 06/12/2020 06/02/2019 04/17/2019 10/06/2018 08/26/2017 05/25/2017 02/09/2017  PHQ - 2 Score 0 0 0 0 0 0 0  PHQ- 9 Score 0 - - 0 - - -  Exception Documentation - - Medical reason - - - -    Fall Risk Fall Risk  06/12/2020 06/02/2019 04/17/2019 10/06/2018  08/26/2017  Falls in the past year? 0 1 0 1 No  Comment - - - fell 2 weeks ago; Dr Darnell Level removed stictches. -  Number falls in past yr: - 0 0 0 -  Injury with Fall? - 1 0 1 -  Comment - - - deep cut on forehead. completely heald today. -  Risk for fall due to : - History of fall(s) No Fall Risks - -  Follow up - Education provided;Falls prevention discussed;Falls evaluation completed Falls evaluation completed Falls evaluation completed -    FALL RISK PREVENTION PERTAINING TO THE HOME:  Any stairs in or around the home? Yes  If so, are there any without handrails? No Home free of loose throw rugs in walkways, pet beds, electrical cords, etc? Yes  Adequate lighting in your home to reduce risk of falls? Yes   ASSISTIVE DEVICES UTILIZED TO PREVENT FALLS:  Life alert? No  Use of a cane, walker or w/c? Yes  Grab bars in the bathroom? No  Shower chair or bench in shower? Yes  Elevated toilet seat or a handicapped toilet? Yes   TIMED UP AND GO:  Was the test performed? Yes .  Length of time to ambulate 10 feet: 11 sec.   Gait steady and fast without use of assistive device  Cognitive Function:     6CIT Screen 06/12/2020 06/02/2019  What Year? 0 points 0 points  What month? 0 points 0 points  What time? 0 points 0 points  Count back from 20 0 points 0 points  Months in reverse 0 points 4 points  Repeat phrase 2 points 4 points  Total Score 2 8    Immunizations Immunization History  Administered Date(s) Administered  . Fluad Quad(high Dose 65+) 10/17/2018, 10/02/2019  . Influenza, High Dose Seasonal PF 10/31/2016  . Influenza-Unspecified 12/04/2018  . Pneumococcal Conjugate-13 02/14/2015  . Pneumococcal Polysaccharide-23 02/02/2006, 08/26/2017  . Td 08/26/2017    TDAP status: Up to date  Flu Vaccine status: Up to date  Pneumococcal vaccine status: Up to date  Covid-19 vaccine status: Declined,  Education has been provided regarding the importance of this vaccine but  patient still declined. Advised may receive this vaccine at local pharmacy or Health Dept.or vaccine clinic. Aware to provide a copy of the vaccination record if obtained from local pharmacy or Health Dept. Verbalized acceptance and understanding.  Qualifies for Shingles Vaccine? Yes   Zostavax completed No   Shingrix Completed?: No.    Education has been provided regarding the importance of this vaccine. Patient has been advised to call insurance company to determine out of pocket expense if they have not yet received this vaccine. Advised may also receive vaccine at local pharmacy or Health Dept. Verbalized acceptance and understanding.  Screening Tests Health Maintenance  Topic Date Due  . COVID-19 Vaccine (1) Never done  . FOOT EXAM  02/23/2018  . OPHTHALMOLOGY EXAM  09/17/2019  . INFLUENZA VACCINE  09/02/2020  . HEMOGLOBIN A1C  11/08/2020  . TETANUS/TDAP  09/12/2028  . PNA vac Low Risk Adult  Completed  . HPV VACCINES  Aged Out    Health Maintenance  Health Maintenance Due  Topic Date Due  . COVID-19 Vaccine (1) Never done  . FOOT EXAM  02/23/2018  . OPHTHALMOLOGY EXAM  09/17/2019    Colorectal cancer screening: No longer required.   Lung Cancer Screening: (Low Dose CT Chest recommended if Age 55-80 years, 30 pack-year currently smoking OR have quit w/in 15years.) does not qualify.   Lung Cancer Screening Referral: N/A  Additional Screening:  Hepatitis C Screening: does qualify; will not test per patient preference  Vision Screening: Recommended annual ophthalmology exams for early detection of glaucoma and other disorders of the eye. Is the patient up to date with their annual eye exam?  Yes  Who is the provider or what is the name of the office in which the patient attends annual eye exams? Dr. Fox If pt is not established with a provider, would they like to be referred to a provider to establish care? No .   Dental Screening: Recommended annual dental exams for  proper oral hygiene  Community Resource Referral / Chronic Care Management: CRR required this visit?  No   CCM required this visit?  No      Plan:    Patient is up-to-date with most recommended screenings for adult male of his age as well as with most immunizations.  We did discuss that he could consider getting COVID-19 immunization despite having had COVID-19 infection earlier this year.  We also discussed possibly getting a shingles vaccine.  He could be screening for hepatitis C but we did discuss risk factors and he is low risk at this time.  Per shared decision making we will hold off on screening for now.   I have personally reviewed and noted the following in the patient's chart:   . Medical and social history . Use of alcohol, tobacco or illicit drugs  . Current medications and supplements including opioid prescriptions. Patient is not currently taking opioid prescriptions. . Functional ability and status . Nutritional status . Physical activity . Advanced directives . List of other physicians . Hospitalizations, surgeries, and ER visits in previous 12 months . Vitals . Screenings to include cognitive, depression, and falls . Referrals and appointments  In addition, I have reviewed and discussed with patient certain preventive protocols, quality metrics, and best practice recommendations. A written personalized care plan for preventive services as well as general preventive health recommendations were provided to patient.    Patient will follow up   in 2 months or sooner as needed.  Ailene Ards, NP   06/12/2020

## 2020-07-02 ENCOUNTER — Ambulatory Visit (INDEPENDENT_AMBULATORY_CARE_PROVIDER_SITE_OTHER): Payer: Medicare Other

## 2020-07-02 DIAGNOSIS — Z95810 Presence of automatic (implantable) cardiac defibrillator: Secondary | ICD-10-CM

## 2020-07-02 DIAGNOSIS — I5022 Chronic systolic (congestive) heart failure: Secondary | ICD-10-CM | POA: Diagnosis not present

## 2020-07-02 NOTE — Progress Notes (Signed)
EPIC Encounter for ICM Monitoring  Patient Name: Omar Williams is a 85 y.o. male Date: 07/02/2020 Primary Care Physican: Wilson Singer, MD Primary Cardiologist: Wyline Mood Electrophysiologist:Allred Nephrologist:[facility] Bi-V Pacing:99.3% 4/11/2022Weight:160lbs 07/02/2020 Weight: 160-161 lbs  Spoke with patient and reports feeling well at this time. Heart failure questions reviewed. Pt asymptomatic for fluid at this time. He did have minimal swelling of feet a few days ago but has resolved.  He reports limiting salt intake as recommended.  OptivolThoracic impedancesuggestingpossible fluid accumulation starting 06/01/2020. Fluid index > normal threshold for 2 episodes since March 2022 which is a different pattern for him.   Prescribed: Furosemide 20 mg take 1 tablet as needed for swelling.  Labs: 05/17/2020 Creatinine 1.17, BUN 14, Potassium 4.4, Sodium 143 05/09/2020 Creatinine1.01, BUN17, Potassium4.0, Sodium138, [id number]  04/10/2020 Creatinine1.07, BUN16, Potassium4.3, Sodium139, [id number] 03/30/2020 Creatinine1.02, BUN24, Potassium4.2, Sodium133, [id number]  03/29/2020 Creatinine1.24, BUN28, Potassium4.4, Sodium135, [id number] Care Everywhere A complete set of results can be found in Results Review.  Recommendations:  Advised to take PRN Furosemide 1 tablet x 2 days.    Follow-up plan: ICM clinic phone appointment on6/04/2020 (manual) to recheck fluid levels. 91 day device clinic remote transmission6/09/2020.   EP/Cardiology Office Visits:6/17/2022with Dr.Branch.   Copy of ICM check sent to Dr.Allredand Dr Wyline Mood for [facility].   3 month ICM trend: 07/02/2020.    1 Year ICM trend:       Karie Soda, RN 07/02/2020 10:26 AM

## 2020-07-05 ENCOUNTER — Ambulatory Visit (INDEPENDENT_AMBULATORY_CARE_PROVIDER_SITE_OTHER): Payer: Medicare Other

## 2020-07-05 ENCOUNTER — Telehealth: Payer: Self-pay

## 2020-07-05 DIAGNOSIS — I5022 Chronic systolic (congestive) heart failure: Secondary | ICD-10-CM

## 2020-07-05 DIAGNOSIS — Z95810 Presence of automatic (implantable) cardiac defibrillator: Secondary | ICD-10-CM

## 2020-07-05 NOTE — Progress Notes (Signed)
EPIC Encounter for ICM Monitoring  Patient Name: Omar Williams is a 85 y.o. male Date: 07/05/2020 Primary Care Physican: Wilson Singer, MD Primary Cardiologist: Wyline Mood Electrophysiologist:Allred Nephrologist:[facility] Bi-V Pacing:99% 07/02/2020 Weight: 160-161 lbs  Spoke with patient and reports feeling well at this time. Heart failure questions reviewed. Pt asymptomatic for fluid.  He eats at restaurants once to twice a week otherwise eats meals at home.  Encouraged to continue to review food labels for salt content.   OptivolThoracic impedancesuggesting fluid levels returned to normal after taking PRN Furosemide and patient also limited salt intake.   Prescribed: Furosemide 20 mg take 1 tablet as needed for swelling.  Labs: 05/17/2020 Creatinine 1.17, BUN 14, Potassium 4.4, Sodium 143 05/09/2020 Creatinine1.01, BUN17, Potassium4.0, Sodium138, [id number]  04/10/2020 Creatinine1.07, BUN16, Potassium4.3, Sodium139, [id number] 03/30/2020 Creatinine1.02, BUN24, Potassium4.2, Sodium133, [id number]  03/29/2020 Creatinine1.24, BUN28, Potassium4.4, Sodium135, [id number] Care Everywhere A complete set of results can be found in Results Review.  Recommendations:No changes and encouraged to call if experiencing any fluid symptoms.    Follow-up plan: ICM clinic phone appointment on7/06/2020.91 day device clinic remote transmission6/09/2020.   EP/Cardiology Office Visits:6/17/2022with Dr.Branch.   Copy of ICM check sent to Dr.Allred.   3 month ICM trend: 07/05/2020.    1 Year ICM trend:       Karie Soda, RN 07/05/2020 9:05 AM

## 2020-07-05 NOTE — Telephone Encounter (Signed)
Spoke with patient.  Attempted to assist with sending remote transmission and unsuccessful.  Monitor showed 3230 error code.  Provided [employer] support number and advised to call for assistance with troubleshooting monitor.  Pt will call back with update.

## 2020-07-10 ENCOUNTER — Ambulatory Visit (INDEPENDENT_AMBULATORY_CARE_PROVIDER_SITE_OTHER): Payer: Medicare Other

## 2020-07-10 DIAGNOSIS — I255 Ischemic cardiomyopathy: Secondary | ICD-10-CM

## 2020-07-10 LAB — CUP PACEART REMOTE DEVICE CHECK
Battery Remaining Longevity: 49 mo
Battery Voltage: 2.96 V
Brady Statistic AP VP Percent: 39.28 %
Brady Statistic AP VS Percent: 0.06 %
Brady Statistic AS VP Percent: 59.94 %
Brady Statistic AS VS Percent: 0.72 %
Brady Statistic RA Percent Paced: 39.32 %
Brady Statistic RV Percent Paced: 82.22 %
Date Time Interrogation Session: 20220608012405
HighPow Impedance: 79 Ohm
Implantable Lead Implant Date: 20181130
Implantable Lead Implant Date: 20181130
Implantable Lead Implant Date: 20181130
Implantable Lead Location: 753858
Implantable Lead Location: 753859
Implantable Lead Location: 753860
Implantable Lead Model: 4598
Implantable Lead Model: 5076
Implantable Lead Model: 6935
Implantable Pulse Generator Implant Date: 20181130
Lead Channel Impedance Value: 141.867
Lead Channel Impedance Value: 141.867
Lead Channel Impedance Value: 152 Ohm
Lead Channel Impedance Value: 152 Ohm
Lead Channel Impedance Value: 152 Ohm
Lead Channel Impedance Value: 266 Ohm
Lead Channel Impedance Value: 304 Ohm
Lead Channel Impedance Value: 304 Ohm
Lead Channel Impedance Value: 304 Ohm
Lead Channel Impedance Value: 342 Ohm
Lead Channel Impedance Value: 380 Ohm
Lead Channel Impedance Value: 456 Ohm
Lead Channel Impedance Value: 456 Ohm
Lead Channel Impedance Value: 456 Ohm
Lead Channel Impedance Value: 494 Ohm
Lead Channel Impedance Value: 513 Ohm
Lead Channel Impedance Value: 589 Ohm
Lead Channel Impedance Value: 646 Ohm
Lead Channel Pacing Threshold Amplitude: 0.5 V
Lead Channel Pacing Threshold Amplitude: 0.75 V
Lead Channel Pacing Threshold Amplitude: 1.375 V
Lead Channel Pacing Threshold Pulse Width: 0.4 ms
Lead Channel Pacing Threshold Pulse Width: 0.4 ms
Lead Channel Pacing Threshold Pulse Width: 0.4 ms
Lead Channel Sensing Intrinsic Amplitude: 2.75 mV
Lead Channel Sensing Intrinsic Amplitude: 2.75 mV
Lead Channel Sensing Intrinsic Amplitude: 9.5 mV
Lead Channel Sensing Intrinsic Amplitude: 9.5 mV
Lead Channel Setting Pacing Amplitude: 2 V
Lead Channel Setting Pacing Amplitude: 2 V
Lead Channel Setting Pacing Amplitude: 2.5 V
Lead Channel Setting Pacing Pulse Width: 0.4 ms
Lead Channel Setting Pacing Pulse Width: 0.4 ms
Lead Channel Setting Sensing Sensitivity: 0.3 mV

## 2020-07-18 NOTE — Progress Notes (Signed)
Cardiology Office Note  Date: 07/19/2020   ID: Omar Williams, DOB [birth year], MRN [id number]  PCP:  Omar Albee, MD  Cardiologist:  Omar Dolly, MD Electrophysiologist:  Omar Grayer, MD   Chief Complaint: 6 month follow up  History of Present Illness: Omar Williams is a 85 y.o. male with a history of chronic systolic HF, ischemic cardiomyopathy, CAD, BIVICD, HTN, HLD, CKD  Last seen by Dr Omar Williams. History of LAD disease without targets for revascularization. BIVICD in place. Normal at prior check in September 2021. No anginal or exertional symptoms / SOB / DOE. LDL 69 July 2021. Continuing atorvastatin 80 mg po daily and Zetia 10 mg po daily. His CKD was being followed by Dr.Bhutani, nephrology. His medical therapy had been limited by low blood pressures. He was continuing medical therapy. BP and lipids were at goal.  Had recent remote device check on 07/10/2020 demonstrating: Device check reviewed. Normal device function. Battery status, leads stable. Histograms reviewed andappropriate.  Routine follow-up  Is here today for 70-monthfollow-up he states earlier in the year he had the COVID virus and stayed in the hospital for 21 days for treatment.  States he is feeling much better now.  He denies any deleterious effects from the COVID virus other than loss of sense of taste and smell.  He denies any anginal or exertional symptoms, orthostatic symptoms, SOB or DOE, PND, orthopnea, bleeding issues, palpitations.  No DVT or PE-like symptoms, lower extremity edema.  EKG today shows electronic ventricular pacemaker rhythm with a rate of 64.  Blood pressure is well controlled today at 114/62.  Current cardiac regimen includes; aspirin 81 mg daily, atorvastatin 80 mg daily, carvedilol 6.25 mg p.o. twice daily, Zetia 10 mg daily, Lasix 20 mg p.o. as needed, losartan 25 mg daily, nitroglycerin sublingual as needed.  States he has been doing some exercises at home.  Past Medical History:   Diagnosis Date   Acid reflux    Blood transfusion without reported diagnosis    Cardiac arrest ([place]    a. occuring in 12/2016. LAD disease but no targets for CABG or PCI. Underwent ICD placement as EF 15-20%.    CHF (congestive heart failure) (HCC)    Coronary artery disease    Diabetes mellitus without complication ([place]    Hyperlipidemia    Hypertension    Myocardial infarction ([facility]    Renal disorder     Past Surgical History:  Procedure Laterality Date   BIV ICD INSERTION CRT-D N/A 01/01/2017   Procedure: BIV ICD INSERTION CRT-D;  Surgeon: CConstance Haw MD;  [facility];  Service: Cardiovascular;  Laterality: N/A;    Current Outpatient Medications  Medication Sig Dispense Refill   aspirin EC 81 MG tablet Take 81 mg by mouth daily.     atorvastatin (LIPITOR) 80 MG tablet TAKE 1 TABLET BY MOUTH EVERYDAY AT BEDTIME 90 tablet 0   Bee Pollen 550 MG CAPS Take 2 capsules by mouth 2 (two) times daily.     blood glucose meter kit and supplies KIT Test daily.  Dx e11.22 1 each 0   carvedilol (COREG) 6.25 MG tablet TAKE 1 TABLET BY MOUTH TWICE A DAY 180 tablet 0   chlorhexidine (PERIDEX) 0.12 % solution as needed.     Cholecalciferol (VITAMIN D3) 125 MCG (5000 UT) TABS Take 1 tablet by mouth daily.      CINNAMON PO Take 1,000 mg by mouth in the morning and at bedtime.  ezetimibe (ZETIA) 10 MG tablet TAKE 1 TABLET BY MOUTH EVERY DAY 90 tablet 1   FIBER ADULT GUMMIES PO Take 2 tablets by mouth at bedtime.     finasteride (PROSCAR) 5 MG tablet Take 1 tablet (5 mg total) by mouth at bedtime. 90 tablet 3   furosemide (LASIX) 20 MG tablet TAKE 1 TABLET DAILY AS NEEDED FOR SWELLING 90 tablet 1   glimepiride (AMARYL) 4 MG tablet TAKE 1 TABLET (4 MG TOTAL) BY MOUTH IN THE MORNING AND AT BEDTIME. 180 tablet 0   losartan (COZAAR) 25 MG tablet TAKE 1 TABLET BY MOUTH TWICE A DAY 180 tablet 3   nitroGLYCERIN (NITROSTAT) 0.4 MG SL tablet Place 1 tablet (0.4 mg total)  under the tongue every 5 (five) minutes x 3 doses as needed for chest pain. 25 tablet 3   Omega-3 Fatty Acids (FISH OIL) 1200 MG CAPS Take 1 capsule by mouth 2 (two) times daily.      ONETOUCH ULTRA test strip TEST ONE TIME DAILY E11.9 100 strip 2   oxybutynin (DITROPAN) 5 MG tablet Take 0.5 tablets (2.5 mg total) by mouth 3 (three) times daily. 135 tablet 3   senna-docusate (SENOKOT-S) 8.6-50 MG tablet Take 1 tablet by mouth 2 (two) times daily as needed for mild constipation. 180 tablet 0   tamsulosin (FLOMAX) 0.4 MG CAPS capsule Take 1 capsule (0.4 mg total) by mouth every morning. 90 capsule 3   TURMERIC PO Take 1,000 mg by mouth 2 (two) times daily.     vitamin B-12 (CYANOCOBALAMIN) 1000 MCG tablet Take 1,000 mcg by mouth every morning.      vitamin C (ASCORBIC ACID) 500 MG tablet Take 500 mg by mouth 2 (two) times daily.     No current facility-administered medications for this visit.   Allergies:  Patient has no known allergies.   Social History: The patient  reports that he quit smoking about 62 years ago. His smoking use included cigarettes. He has never used smokeless tobacco. He reports that he does not drink alcohol and does not use drugs.   Family History: The patient's family history includes CAD in his brother, brother, and sister; Dementia in his brother; Diabetes in his brother, brother, daughter, and sister; Fibromyalgia in his daughter; Heart disease in his daughter; Hyperlipidemia in his brother, brother, daughter, mother, and sister; Hypertension in his brother, brother, daughter, mother, and sister; Multiple sclerosis in his daughter; Thyroid disease in his daughter and daughter.   ROS:  Please see the history of present illness. Otherwise, complete review of systems is positive for none.  All other systems are reviewed and negative.   Physical Exam: VS:  BP 114/62   Pulse 70   Ht '5\' 7"'  (1.702 m)   Wt 159 lb 3.2 oz (72.2 kg)   SpO2 93%   BMI 24.93 kg/m , BMI Body mass  index is 24.93 kg/m.  Wt Readings from Last 3 Encounters:  07/19/20 159 lb 3.2 oz (72.2 kg)  06/12/20 156 lb 9.6 oz (71 kg)  05/09/20 160 lb 9.6 oz (72.8 kg)    General: Patient appears comfortable at rest. Neck: Supple, no elevated JVP or carotid bruits, no thyromegaly. Lungs: Clear to auscultation, nonlabored breathing at rest. Cardiac: Regular rate and rhythm, no S3 or significant systolic murmur, no pericardial rub. Extremities: No pitting edema, distal pulses 2+. Skin: Warm and dry. Musculoskeletal: No kyphosis. Neuropsychiatric: Alert and oriented x3, affect grossly appropriate.  ECG: July 19, 2020 electronic ventricular pacemaker  rate of 64.  Recent Labwork: 05/09/2020: ALT 18; AST 23; BUN 17; Creat 1.01; Potassium 4.0; Sodium 138     Component Value Date/Time   CHOL 134 08/08/2019 1323   TRIG 132 08/08/2019 1323   HDL 43 08/08/2019 1323   CHOLHDL 3.1 08/08/2019 1323   VLDL 27 06/01/2017 1538   LDLCALC 69 08/08/2019 1323    Other Studies Reviewed Today: Diagnostic Studies Echo 06/09/17:   Study Conclusions   - Left ventricle: The cavity size was normal. Wall thickness was   normal. Systolic function was mildly to moderately reduced. The   estimated ejection fraction was in the range of 40% to 45%.   Diffuse hypokinesis. Doppler parameters are consistent with   abnormal left ventricular relaxation (grade 1 diastolic   dysfunction). - Aortic valve: Poorly visualized. Probably trileaflet. There was   mild regurgitation. - Mitral valve: There was trivial regurgitation. - Left atrium: The atrium was mildly dilated. - Right ventricle: Pacer wire or catheter noted in right ventricle. - Right atrium: Central venous pressure (est): 3 mm Hg. - Atrial septum: No defect or patent foramen ovale was identified. - Tricuspid valve: There was trivial regurgitation. - Pulmonary arteries: PA peak pressure: 13 mm Hg (S). - Pericardium, extracardiac: There was no pericardial  effusion.   Impressions:   - Images are limited, but LVEF has improved in comparison to the   previous study in November 2018.     Assessment and Plan:  1. CAD in native artery   2. Chronic systolic heart failure ([place])   3. Ischemic cardiomyopathy   4. Essential hypertension   5. Mixed hyperlipidemia    1. CAD in native artery Denies any anginal or exertional symptoms.  Continue aspirin 81 mg daily.  Continue sublingual nitroglycerin as needed  2. Chronic systolic heart failure ([place]) He denies any shortness of breath or DOE, weight gain.  In fact he has lost some weight.  Denies any lower extremity edema.  Current rate 159.  Continue carvedilol 6.25 mg p.o. twice daily.  Continue Lasix 20 mg p.o. as needed.  Continue losartan 25 mg p.o. twice daily  3. Ischemic cardiomyopathy Last echocardiogram May 2019 demonstrated EF of 40 to 45% diffuse hypokinesis, G1 DD, trivial MR, trivial TR  4. Essential hypertension Blood pressure well controlled on current therapy at 114/62.  Continue carvedilol 6.25 mg p.o. twice daily.  Continue losartan 25 mg p.o. twice daily.  5. Mixed hyperlipidemia Continue atorvastatin 80 mg p.o. daily.  Last lipid panel on August 08, 2019: TC 134, HDL 43, TG 132, LDL 69  Medication Adjustments/Labs and Tests Ordered: Current medicines are reviewed at length with the patient today.  Concerns regarding medicines are outlined above.   Disposition: Follow-up with Dr. Harl Williams or APP 6 months  Signed, Levell July, NP 07/19/2020 11:34 AM    [facility] [place], [place], [place] [address] Phone: [phone]; Fax: [phone]

## 2020-07-19 ENCOUNTER — Other Ambulatory Visit: Payer: Self-pay

## 2020-07-19 ENCOUNTER — Encounter: Payer: Self-pay | Admitting: Family Medicine

## 2020-07-19 ENCOUNTER — Ambulatory Visit: Payer: Medicare Other | Admitting: Cardiology

## 2020-07-19 ENCOUNTER — Ambulatory Visit (INDEPENDENT_AMBULATORY_CARE_PROVIDER_SITE_OTHER): Payer: Medicare Other | Admitting: Family Medicine

## 2020-07-19 VITALS — BP 114/62 | HR 70 | Ht 67.0 in | Wt 159.2 lb

## 2020-07-19 DIAGNOSIS — I5022 Chronic systolic (congestive) heart failure: Secondary | ICD-10-CM

## 2020-07-19 DIAGNOSIS — I10 Essential (primary) hypertension: Secondary | ICD-10-CM

## 2020-07-19 DIAGNOSIS — I255 Ischemic cardiomyopathy: Secondary | ICD-10-CM | POA: Diagnosis not present

## 2020-07-19 DIAGNOSIS — E782 Mixed hyperlipidemia: Secondary | ICD-10-CM | POA: Diagnosis not present

## 2020-07-19 DIAGNOSIS — I2510 Atherosclerotic heart disease of native coronary artery without angina pectoris: Secondary | ICD-10-CM

## 2020-07-19 NOTE — Patient Instructions (Signed)
Medication Instructions:  Continue all current medications.   Labwork: none  Testing/Procedures: none  Follow-Up: 6 months   Any Other Special Instructions Will Be Listed Below (If Applicable).   If you need a refill on your cardiac medications before your next appointment, please call your pharmacy.  

## 2020-07-22 ENCOUNTER — Other Ambulatory Visit (INDEPENDENT_AMBULATORY_CARE_PROVIDER_SITE_OTHER): Payer: Self-pay | Admitting: Nurse Practitioner

## 2020-07-22 DIAGNOSIS — E785 Hyperlipidemia, unspecified: Secondary | ICD-10-CM

## 2020-07-27 ENCOUNTER — Other Ambulatory Visit: Payer: Self-pay | Admitting: Family Medicine

## 2020-07-30 ENCOUNTER — Other Ambulatory Visit (INDEPENDENT_AMBULATORY_CARE_PROVIDER_SITE_OTHER): Payer: Self-pay | Admitting: Internal Medicine

## 2020-07-30 DIAGNOSIS — E1122 Type 2 diabetes mellitus with diabetic chronic kidney disease: Secondary | ICD-10-CM

## 2020-07-30 DIAGNOSIS — N183 Chronic kidney disease, stage 3 unspecified: Secondary | ICD-10-CM

## 2020-08-01 NOTE — Progress Notes (Signed)
Remote ICD transmission.   

## 2020-08-06 ENCOUNTER — Telehealth: Payer: Self-pay

## 2020-08-06 ENCOUNTER — Ambulatory Visit (INDEPENDENT_AMBULATORY_CARE_PROVIDER_SITE_OTHER): Payer: Medicare Other

## 2020-08-06 DIAGNOSIS — I5022 Chronic systolic (congestive) heart failure: Secondary | ICD-10-CM

## 2020-08-06 DIAGNOSIS — Z95810 Presence of automatic (implantable) cardiac defibrillator: Secondary | ICD-10-CM

## 2020-08-06 NOTE — Telephone Encounter (Signed)
Remote ICM transmission received.  Attempted call to patient regarding ICM remote transmission and left message per DPR to return call.   

## 2020-08-06 NOTE — Progress Notes (Signed)
Spoke with patient and he is feeling well.  He denies any fluid symptoms.  [place] was at the beach from [place]17-6[place]28 which may contribute to decreased impedance.  Weight is stable at 157 lbs.  Transmission reviewed and advised to take PRN Furosemide x 2 days, limit salt and fluid intake.  He verbalized understanding and will recheck fluid levels 08/15/2020.

## 2020-08-06 NOTE — Telephone Encounter (Signed)
Opened in error

## 2020-08-06 NOTE — Progress Notes (Signed)
EPIC Encounter for ICM Monitoring  Patient Name: Omar Williams is a 85 y.o. male Date: 08/06/2020 Primary Care Physican: Wilson Singer, MD Primary Cardiologist:  Branch Electrophysiologist: Allred Nephrologist: [facility] Bi-V Pacing: 99.2% 07/02/2020 Weight: 160-161 lbs   Attempted call to patient and unable to reach.  Left message to return call. Transmission reviewed.    Optivol Thoracic impedance suggesting possible fluid accumulation since 07/09/2020.   Impedance pattern has changed and is suggesting possible fluid accumulation the majority of days since 04/16/2020.   Prescribed:  Furosemide 20 mg take 1 tablet as needed for swelling.   Labs: 05/17/2020 Creatinine 1.17, BUN 14, Potassium 4.4, Sodium 143 05/09/2020 Creatinine 1.01, BUN 17, Potassium 4.0, Sodium 138, GFR 69-80 04/10/2020 Creatinine 1.07, BUN 16, Potassium 4.3, Sodium 139, GFR 64-75 03/30/2020 Creatinine 1.02, BUN 24, Potassium 4.2, Sodium 133, GFR 68-79 03/29/2020 Creatinine 1.24, BUN 28, Potassium 4.4, Sodium 135, GFR 54-62 Care Everywhere  A complete set of results can be found in Results Review.   Recommendations: Unable to reach.  If patient returns call, will advise to take PRN Furosemide.   Follow-up plan: ICM clinic phone appointment on 08/15/2020 to recheck fluid levels.   91 day device clinic remote transmission 10/09/2020.       EP/Cardiology Office Visits: 01/20/2021 with Rennis Harding, NP.       Copy of ICM check sent to Dr. Johney Frame and Dr Wyline Mood as [facility] Childes.   3 month ICM trend: 08/06/2020.    1 Year ICM trend:       Karie Soda, RN 08/06/2020 12:11 PM

## 2020-08-11 ENCOUNTER — Other Ambulatory Visit (INDEPENDENT_AMBULATORY_CARE_PROVIDER_SITE_OTHER): Payer: Self-pay | Admitting: Internal Medicine

## 2020-08-11 DIAGNOSIS — E559 Vitamin D deficiency, unspecified: Secondary | ICD-10-CM

## 2020-08-11 DIAGNOSIS — I10 Essential (primary) hypertension: Secondary | ICD-10-CM

## 2020-08-11 DIAGNOSIS — Z20822 Contact with and (suspected) exposure to covid-19: Secondary | ICD-10-CM | POA: Diagnosis not present

## 2020-08-15 ENCOUNTER — Other Ambulatory Visit: Payer: Self-pay | Admitting: Urology

## 2020-08-15 ENCOUNTER — Encounter: Payer: Self-pay | Admitting: Urology

## 2020-08-15 ENCOUNTER — Other Ambulatory Visit: Payer: Self-pay

## 2020-08-15 ENCOUNTER — Ambulatory Visit (INDEPENDENT_AMBULATORY_CARE_PROVIDER_SITE_OTHER): Payer: Medicare Other | Admitting: Urology

## 2020-08-15 ENCOUNTER — Ambulatory Visit (INDEPENDENT_AMBULATORY_CARE_PROVIDER_SITE_OTHER): Payer: Medicare Other

## 2020-08-15 VITALS — BP 104/62 | HR 91

## 2020-08-15 DIAGNOSIS — Z95810 Presence of automatic (implantable) cardiac defibrillator: Secondary | ICD-10-CM

## 2020-08-15 DIAGNOSIS — R351 Nocturia: Secondary | ICD-10-CM

## 2020-08-15 DIAGNOSIS — Z87442 Personal history of urinary calculi: Secondary | ICD-10-CM

## 2020-08-15 DIAGNOSIS — I5022 Chronic systolic (congestive) heart failure: Secondary | ICD-10-CM

## 2020-08-15 DIAGNOSIS — N401 Enlarged prostate with lower urinary tract symptoms: Secondary | ICD-10-CM

## 2020-08-15 DIAGNOSIS — I255 Ischemic cardiomyopathy: Secondary | ICD-10-CM

## 2020-08-15 DIAGNOSIS — N138 Other obstructive and reflux uropathy: Secondary | ICD-10-CM

## 2020-08-15 LAB — MICROSCOPIC EXAMINATION
Bacteria, UA: NONE SEEN
Epithelial Cells (non renal): NONE SEEN /hpf (ref 0–10)
RBC, Urine: NONE SEEN /hpf (ref 0–2)
Renal Epithel, UA: NONE SEEN /hpf
WBC, UA: NONE SEEN /hpf (ref 0–5)

## 2020-08-15 LAB — URINALYSIS, ROUTINE W REFLEX MICROSCOPIC
Bilirubin, UA: NEGATIVE
Glucose, UA: NEGATIVE
Leukocytes,UA: NEGATIVE
Nitrite, UA: NEGATIVE
RBC, UA: NEGATIVE
Specific Gravity, UA: 1.025 (ref 1.005–1.030)
Urobilinogen, Ur: 0.2 mg/dL (ref 0.2–1.0)
pH, UA: 5.5 (ref 5.0–7.5)

## 2020-08-15 MED ORDER — OXYBUTYNIN CHLORIDE 5 MG PO TABS: 2.5000 mg | ORAL_TABLET | Freq: Three times a day (TID) | ORAL | 3 refills | Status: DC

## 2020-08-15 MED ORDER — FINASTERIDE 5 MG PO TABS: 5.0000 mg | ORAL_TABLET | Freq: Every day | ORAL | 3 refills | Status: DC

## 2020-08-15 MED ORDER — TAMSULOSIN HCL 0.4 MG PO CAPS: 0.4000 mg | ORAL_CAPSULE | ORAL | 3 refills | Status: DC

## 2020-08-15 NOTE — Progress Notes (Signed)
Subjective:  1. BPH with obstruction/lower urinary tract symptoms   2. Nocturia   3. History of nephrolithiasis     .   [name] returns today in f/u. he has a history of stones and renal cysts. Renal US in 2020 showedd no stones but there are stable renal cysts. He has had no flank pain or hematuria.    He remains on tamsulosin and finasteride. He was given Myrbetriq which worked but was too expensive. He remains on Oxybutynin and is taking 2.55m tid with improvement in the urgency and nocturia. He has nocturia up to 3x. His IPSS is 4. He has no associated signs or symptoms.   IPSS     Row Name 08/15/20 1500         International Prostate Symptom Score   How often have you had the sensation of not emptying your bladder? Not at All     How often have you had to urinate less than every two hours? Less than 1 in 5 times     How often have you found you stopped and started again several times when you urinated? Not at All     How often have you found it difficult to postpone urination? Not at All     How often have you had a weak urinary stream? Not at All     How often have you had to strain to start urination? Not at All     How many times did you typically get up at night to urinate? 3 Times     Total IPSS Score 4           Quality of Life due to urinary symptoms     If you were to spend the rest of your life with your urinary condition just the way it is now how would you feel about that? Pleased             ROS:  ROS:  A complete review of systems was performed.  All systems are negative except for pertinent findings as noted.   ROS  No Known Allergies  Outpatient Encounter Medications as of 08/15/2020  Medication Sig   aspirin EC 81 MG tablet Take 81 mg by mouth daily.   atorvastatin (LIPITOR) 80 MG tablet TAKE 1 TABLET BY MOUTH EVERYDAY AT BEDTIME   Bee Pollen 550 MG CAPS Take 2 capsules by mouth 2 (two) times daily.   blood glucose meter kit and supplies KIT  Test daily.  Dx e11.22   carvedilol (COREG) 6.25 MG tablet TAKE 1 TABLET BY MOUTH TWICE A DAY   chlorhexidine (PERIDEX) 0.12 % solution as needed.   Cholecalciferol (VITAMIN D3) 125 MCG (5000 UT) TABS Take 1 tablet by mouth daily.    CINNAMON PO Take 1,000 mg by mouth in the morning and at bedtime.   ezetimibe (ZETIA) 10 MG tablet TAKE 1 TABLET BY MOUTH EVERY DAY   FIBER ADULT GUMMIES PO Take 2 tablets by mouth at bedtime.   furosemide (LASIX) 20 MG tablet TAKE 1 TABLET DAILY AS NEEDED FOR SWELLING   glimepiride (AMARYL) 4 MG tablet TAKE 1 TABLET (4 MG TOTAL) BY MOUTH IN THE MORNING AND AT BEDTIME.   losartan (COZAAR) 25 MG tablet TAKE 1 TABLET BY MOUTH TWICE A DAY   nitroGLYCERIN (NITROSTAT) 0.4 MG SL tablet Place 1 tablet (0.4 mg total) under the tongue every 5 (five) minutes x 3 doses as needed for chest pain.   Omega-3 Fatty Acids (  FISH OIL) 1200 MG CAPS Take 1 capsule by mouth 2 (two) times daily.    ONETOUCH ULTRA test strip TEST ONE TIME DAILY E11.9   senna-docusate (SENOKOT-S) 8.6-50 MG tablet Take 1 tablet by mouth 2 (two) times daily as needed for mild constipation.   TURMERIC PO Take 1,000 mg by mouth 2 (two) times daily.   vitamin B-12 (CYANOCOBALAMIN) 1000 MCG tablet Take 1,000 mcg by mouth every morning.    vitamin C (ASCORBIC ACID) 500 MG tablet Take 500 mg by mouth 2 (two) times daily.   [DISCONTINUED] finasteride (PROSCAR) 5 MG tablet Take 1 tablet (5 mg total) by mouth at bedtime.   [DISCONTINUED] oxybutynin (DITROPAN) 5 MG tablet Take 0.5 tablets (2.5 mg total) by mouth 3 (three) times daily.   [DISCONTINUED] tamsulosin (FLOMAX) 0.4 MG CAPS capsule Take 1 capsule (0.4 mg total) by mouth every morning.   finasteride (PROSCAR) 5 MG tablet Take 1 tablet (5 mg total) by mouth at bedtime.   oxybutynin (DITROPAN) 5 MG tablet Take 0.5 tablets (2.5 mg total) by mouth 3 (three) times daily.   tamsulosin (FLOMAX) 0.4 MG CAPS capsule Take 1 capsule (0.4 mg total) by mouth every  morning.   No facility-administered encounter medications on file as of 08/15/2020.    Past Medical History:  Diagnosis Date   Acid reflux    Blood transfusion without reported diagnosis    Cardiac arrest ([place])    a. occuring in 12/2016. LAD disease but no targets for CABG or PCI. Underwent ICD placement as EF 15-20%.    CHF (congestive heart failure) (HCC)    Coronary artery disease    Diabetes mellitus without complication ([place])    Hyperlipidemia    Hypertension    Myocardial infarction [facility])    Renal disorder     Past Surgical History:  Procedure Laterality Date   BIV ICD INSERTION CRT-D N/A 01/01/2017   Procedure: BIV ICD INSERTION CRT-D;  Surgeon: Constance Haw, MD;  Location: [place] [facility];  Service: Cardiovascular;  Laterality: N/A;    Social History   Socioeconomic History   Marital status: Married    Spouse name: johnna   Number of children: 3   Years of education: 12   Highest education level: Some college, no degree  Occupational History   Occupation: Radiographer, therapeutic   retired   Occupation: Theme park manager  Tobacco Use   Smoking status: Former    Types: Cigarettes    Quit date: 1960    Years since quitting: 62.5   Smokeless tobacco: Never   Tobacco comments:    2-3 years as a teenager  Scientific laboratory technician Use: Never used  Substance and Sexual Activity   Alcohol use: No   Drug use: No   Sexual activity: Never  Other Topics Concern   Not on file  Social History Narrative   Lives with with wife Omar Williams   Married 18 years   Daughter Omar Williams stays frequently   Surveyor, minerals near by   Social Determinants of Radio broadcast assistant Strain: Not on file  Food Insecurity: Not on file  Transportation Needs: Not on file  Physical Activity: Not on file  Stress: Not on file  Social Connections: Not on file  Intimate Partner Violence: Not on file    Family History  Problem Relation Age of Onset   Hypertension Mother    Hyperlipidemia  Mother    CAD Sister    Diabetes Sister  Hyperlipidemia Sister    Hypertension Sister    CAD Brother    Diabetes Brother    Hypertension Brother    Hyperlipidemia Brother    CAD Brother    Diabetes Brother    Hypertension Brother    Hyperlipidemia Brother    Dementia Brother    Thyroid disease Daughter    Hypertension Daughter    Diabetes Daughter    Hyperlipidemia Daughter    Fibromyalgia Daughter    Heart disease Daughter        stents   Multiple sclerosis Daughter    Thyroid disease Daughter        Objective: Vitals:   08/15/20 1421  BP: 104/62  Pulse: 91     Physical Exam Vitals reviewed.  Constitutional:      Appearance: Normal appearance.  Neurological:     Mental Status: He is alert.    Lab Results:  No results found for this or any previous visit (from the past 24 hour(s)).   BMET No results for input(s): NA, K, CL, CO2, GLUCOSE, BUN, CREATININE, CALCIUM in the last 72 hours. PSA No results found for: PSA No results found for: TESTOSTERONE    Studies/Results: No results found.    Assessment & Plan: BPH with BOO and OAB.   He is doing well on current therapy and his meds were refills.  History of stones.  He has had no hematuria or flank pain.    Meds ordered this encounter  Medications   finasteride (PROSCAR) 5 MG tablet    Sig: Take 1 tablet (5 mg total) by mouth at bedtime.    Dispense:  90 tablet    Refill:  3   tamsulosin (FLOMAX) 0.4 MG CAPS capsule    Sig: Take 1 capsule (0.4 mg total) by mouth every morning.    Dispense:  90 capsule    Refill:  3   oxybutynin (DITROPAN) 5 MG tablet    Sig: Take 0.5 tablets (2.5 mg total) by mouth 3 (three) times daily.    Dispense:  135 tablet    Refill:  3     Orders Placed This Encounter  Procedures   Urinalysis, Routine w reflex microscopic      Return in about 1 year (around 08/15/2021).   CC: Doree Albee, MD      Irine Seal 08/15/2020

## 2020-08-15 NOTE — Progress Notes (Signed)
Urological Symptom Review  Patient is experiencing the following symptoms: Get up at night to urinate Erection problems (male only)   Review of Systems  Gastrointestinal (upper)  : Negative for upper GI symptoms  Gastrointestinal (lower) : Negative for lower GI symptoms  Constitutional : Negative for symptoms  Skin: Negative for skin symptoms  Eyes: Negative for eye symptoms  Ear/Nose/Throat : Negative for Ear/Nose/Throat symptoms  Hematologic/Lymphatic: Negative for Hematologic/Lymphatic symptoms  Cardiovascular : Negative for cardiovascular symptoms  Respiratory : Negative for respiratory symptoms  Endocrine: Negative for endocrine symptoms  Musculoskeletal: Negative for musculoskeletal symptoms  Neurological: Negative for neurological symptoms  Psychologic: Negative for psychiatric symptoms 

## 2020-08-15 NOTE — Progress Notes (Signed)
Subjective:  1. BPH with obstruction/lower urinary tract symptoms   2. Nocturia   3. History of nephrolithiasis     .   Mr. Heldt returns today in f/u. he has a history of stones and renal cysts. Renal US prior to his last visit showed no stones but there are stable renal cysts. He has had no flank pain or hematuria.    He remains on tamsulosin and finasteride. He was given Myrbetriq which worked but was too expensive. He was given Oxybutynin and is taking 2.12m tid with improvement in the urgency and nocturia. He has nocturia up to 3x. His IPSS is 5. He has no associated signs or symptoms.   IPSS     Row Name 08/15/20 1500         International Prostate Symptom Score   How often have you had the sensation of not emptying your bladder? Not at All     How often have you had to urinate less than every two hours? Less than 1 in 5 times     How often have you found you stopped and started again several times when you urinated? Not at All     How often have you found it difficult to postpone urination? Not at All     How often have you had a weak urinary stream? Not at All     How often have you had to strain to start urination? Not at All     How many times did you typically get up at night to urinate? 3 Times     Total IPSS Score 4           Quality of Life due to urinary symptoms     If you were to spend the rest of your life with your urinary condition just the way it is now how would you feel about that? Pleased             ROS:  ROS:  A complete review of systems was performed.  All systems are negative except for pertinent findings as noted.   ROS  No Known Allergies  Outpatient Encounter Medications as of 08/15/2020  Medication Sig   aspirin EC 81 MG tablet Take 81 mg by mouth daily.   atorvastatin (LIPITOR) 80 MG tablet TAKE 1 TABLET BY MOUTH EVERYDAY AT BEDTIME   Bee Pollen 550 MG CAPS Take 2 capsules by mouth 2 (two) times daily.   blood glucose meter kit and  supplies KIT Test daily.  Dx e11.22   carvedilol (COREG) 6.25 MG tablet TAKE 1 TABLET BY MOUTH TWICE A DAY   chlorhexidine (PERIDEX) 0.12 % solution as needed.   Cholecalciferol (VITAMIN D3) 125 MCG (5000 UT) TABS Take 1 tablet by mouth daily.    CINNAMON PO Take 1,000 mg by mouth in the morning and at bedtime.   ezetimibe (ZETIA) 10 MG tablet TAKE 1 TABLET BY MOUTH EVERY DAY   FIBER ADULT GUMMIES PO Take 2 tablets by mouth at bedtime.   furosemide (LASIX) 20 MG tablet TAKE 1 TABLET DAILY AS NEEDED FOR SWELLING   glimepiride (AMARYL) 4 MG tablet TAKE 1 TABLET (4 MG TOTAL) BY MOUTH IN THE MORNING AND AT BEDTIME.   losartan (COZAAR) 25 MG tablet TAKE 1 TABLET BY MOUTH TWICE A DAY   nitroGLYCERIN (NITROSTAT) 0.4 MG SL tablet Place 1 tablet (0.4 mg total) under the tongue every 5 (five) minutes x 3 doses as needed for chest pain.  Omega-3 Fatty Acids (FISH OIL) 1200 MG CAPS Take 1 capsule by mouth 2 (two) times daily.    ONETOUCH ULTRA test strip TEST ONE TIME DAILY E11.9   senna-docusate (SENOKOT-S) 8.6-50 MG tablet Take 1 tablet by mouth 2 (two) times daily as needed for mild constipation.   TURMERIC PO Take 1,000 mg by mouth 2 (two) times daily.   vitamin B-12 (CYANOCOBALAMIN) 1000 MCG tablet Take 1,000 mcg by mouth every morning.    vitamin C (ASCORBIC ACID) 500 MG tablet Take 500 mg by mouth 2 (two) times daily.   [DISCONTINUED] finasteride (PROSCAR) 5 MG tablet Take 1 tablet (5 mg total) by mouth at bedtime.   [DISCONTINUED] oxybutynin (DITROPAN) 5 MG tablet Take 0.5 tablets (2.5 mg total) by mouth 3 (three) times daily.   [DISCONTINUED] tamsulosin (FLOMAX) 0.4 MG CAPS capsule Take 1 capsule (0.4 mg total) by mouth every morning.   finasteride (PROSCAR) 5 MG tablet Take 1 tablet (5 mg total) by mouth at bedtime.   oxybutynin (DITROPAN) 5 MG tablet Take 0.5 tablets (2.5 mg total) by mouth 3 (three) times daily.   tamsulosin (FLOMAX) 0.4 MG CAPS capsule Take 1 capsule (0.4 mg total) by mouth  every morning.   No facility-administered encounter medications on file as of 08/15/2020.    Past Medical History:  Diagnosis Date   Acid reflux    Blood transfusion without reported diagnosis    Cardiac arrest ([place])    a. occuring in 12/2016. LAD disease but no targets for CABG or PCI. Underwent ICD placement as EF 15-20%.    CHF (congestive heart failure) (HCC)    Coronary artery disease    Diabetes mellitus without complication ([place])    Hyperlipidemia    Hypertension    Myocardial infarction [facility])    Renal disorder     Past Surgical History:  Procedure Laterality Date   BIV ICD INSERTION CRT-D N/A 01/01/2017   Procedure: BIV ICD INSERTION CRT-D;  Surgeon: Constance Haw, MD;  Location: [facility];  Service: Cardiovascular;  Laterality: N/A;    Social History   Socioeconomic History   Marital status: Married    Spouse name: johnna   Number of children: 3   Years of education: 12   Highest education level: Some college, no degree  Occupational History   Occupation: Radiographer, therapeutic   retired   Occupation: Theme park manager  Tobacco Use   Smoking status: Former    Types: Cigarettes    Quit date: 1960    Years since quitting: 62.5   Smokeless tobacco: Never   Tobacco comments:    2-3 years as a teenager  Scientific laboratory technician Use: Never used  Substance and Sexual Activity   Alcohol use: No   Drug use: No   Sexual activity: Never  Other Topics Concern   Not on file  Social History Narrative   Lives with with wife [name]   Married 38 years   Daughter [name] stays frequently   Surveyor, minerals near by   Social Determinants of Radio broadcast assistant Strain: Not on file  Food Insecurity: Not on file  Transportation Needs: Not on file  Physical Activity: Not on file  Stress: Not on file  Social Connections: Not on file  Intimate Partner Violence: Not on file    Family History  Problem Relation Age of Onset   Hypertension Mother     Hyperlipidemia Mother    CAD Sister    Diabetes  Sister    Hyperlipidemia Sister    Hypertension Sister    CAD Brother    Diabetes Brother    Hypertension Brother    Hyperlipidemia Brother    CAD Brother    Diabetes Brother    Hypertension Brother    Hyperlipidemia Brother    Dementia Brother    Thyroid disease Daughter    Hypertension Daughter    Diabetes Daughter    Hyperlipidemia Daughter    Fibromyalgia Daughter    Heart disease Daughter        stents   Multiple sclerosis Daughter    Thyroid disease Daughter        Objective: Vitals:   08/15/20 1421  BP: 104/62  Pulse: 91     Physical Exam  Lab Results:  Results for orders placed or performed in visit on 08/15/20 (from the past 24 hour(s))  Urinalysis, Routine w reflex microscopic     Status: Abnormal   Collection Time: 08/15/20  2:23 PM  Result Value Ref Range   Specific Gravity, UA 1.025 1.005 - 1.030   pH, UA 5.5 5.0 - 7.5   Color, UA Yellow Yellow   Appearance Ur Clear Clear   Leukocytes,UA Negative Negative   Protein,UA 2+ (A) Negative/Trace   Glucose, UA Negative Negative   Ketones, UA Trace (A) Negative   RBC, UA Negative Negative   Bilirubin, UA Negative Negative   Urobilinogen, Ur 0.2 0.2 - 1.0 mg/dL   Nitrite, UA Negative Negative   Microscopic Examination See below:    Narrative   Performed at:  [address] [facility] [place] [address], [place], [place]  [id number] Lab Director: Mina Marble MT, Phone:  [phone]  Microscopic Examination     Status: None   Collection Time: 08/15/20  2:23 PM   Urine  Result Value Ref Range   WBC, UA None seen 0 - 5 /hpf   RBC None seen 0 - 2 /hpf   Epithelial Cells (non renal) None seen 0 - 10 /hpf   Renal Epithel, UA None seen None seen /hpf   Casts Present None seen /lpf   Cast Type Hyaline casts N/A   Mucus, UA Present Not Estab.   Bacteria, UA None seen None seen/Few   Narrative   Performed at:  [facility] [address], [place], [place]  [id number] Lab Director: [place], Phone:  [phone]    BMET No results for input(s): NA, K, CL, CO2, GLUCOSE, BUN, CREATININE, CALCIUM in the last 72 hours. PSA No results found for: PSA No results found for: TESTOSTERONE    Studies/Results: No results found.    Assessment & Plan:  BPH with BOO and OAB.  I have refilled his meds.  Renal stones and cysts.   RUS shows no stones and stable cysts.   Meds ordered this encounter  Medications   finasteride (PROSCAR) 5 MG tablet    Sig: Take 1 tablet (5 mg total) by mouth at bedtime.    Dispense:  90 tablet    Refill:  3   tamsulosin (FLOMAX) 0.4 MG CAPS capsule    Sig: Take 1 capsule (0.4 mg total) by mouth every morning.    Dispense:  90 capsule    Refill:  3   oxybutynin (DITROPAN) 5 MG tablet    Sig: Take 0.5 tablets (2.5 mg total) by mouth 3 (three) times daily.    Dispense:  135 tablet    Refill:  3  Orders Placed This Encounter  Procedures   Microscopic Examination   Urinalysis, Routine w reflex microscopic      Return in about 1 year (around 08/15/2021).   CC: Doree Albee, MD      Irine Seal 08/15/2020

## 2020-08-16 NOTE — Progress Notes (Signed)
EPIC Encounter for ICM Monitoring  Patient Name: Omar Williams is a 85 y.o. male Date: 08/16/2020 Primary Care Physican: Wilson Singer, MD Primary Cardiologist:  Branch Electrophysiologist: Allred Nephrologist: [facility] Bi-V Pacing: 99.1% 08/06/2020 Weight: 157 lbs 08/16/2020 Weight: 157 lbs   Spoke with patient and heart failure questions reviewed.  Pt asymptomatic for fluid accumulation and feeing well.   Optivol Thoracic impedance suggesting fluid levels returned to normal after taking PRN Furosemide.   Prescribed:  Furosemide 20 mg take 1 tablet as needed for swelling.   Labs: 05/17/2020 Creatinine 1.17, BUN 14, Potassium 4.4, Sodium 143 05/09/2020 Creatinine 1.01, BUN 17, Potassium 4.0, Sodium 138, GFR 69-80 04/10/2020 Creatinine 1.07, BUN 16, Potassium 4.3, Sodium 139, GFR 64-75 03/30/2020 Creatinine 1.02, BUN 24, Potassium 4.2, Sodium 133, GFR 68-79 03/29/2020 Creatinine 1.24, BUN 28, Potassium 4.4, Sodium 135, GFR 54-62 Care Everywhere  A complete set of results can be found in Results Review.   Recommendations:  Recommendation to limit salt intake to 2000 mg daily and fluid intake to 64 oz daily.  Encouraged to call if experiencing any fluid symptoms.    Follow-up plan: ICM clinic phone appointment on 09/09/2020.   91 day device clinic remote transmission 10/09/2020.       EP/Cardiology Office Visits: 01/20/2021 with Rennis Harding, NP.       Copy of ICM check sent to Dr. Johney Frame.   3 month ICM trend: 08/15/2020.    1 Year ICM trend:     Karie Soda, RN 08/16/2020 11:58 AM

## 2020-08-20 ENCOUNTER — Other Ambulatory Visit: Payer: Self-pay | Admitting: Family Medicine

## 2020-08-20 ENCOUNTER — Ambulatory Visit (INDEPENDENT_AMBULATORY_CARE_PROVIDER_SITE_OTHER): Payer: Medicare Other | Admitting: Internal Medicine

## 2020-08-20 ENCOUNTER — Other Ambulatory Visit: Payer: Self-pay

## 2020-08-20 ENCOUNTER — Encounter (INDEPENDENT_AMBULATORY_CARE_PROVIDER_SITE_OTHER): Payer: Self-pay | Admitting: Internal Medicine

## 2020-08-20 VITALS — BP 110/64 | HR 66 | Temp 97.5°F | Ht 67.0 in | Wt 158.0 lb

## 2020-08-20 DIAGNOSIS — E119 Type 2 diabetes mellitus without complications: Secondary | ICD-10-CM

## 2020-08-20 DIAGNOSIS — I10 Essential (primary) hypertension: Secondary | ICD-10-CM | POA: Diagnosis not present

## 2020-08-20 DIAGNOSIS — K4091 Unilateral inguinal hernia, without obstruction or gangrene, recurrent: Secondary | ICD-10-CM

## 2020-08-20 DIAGNOSIS — I255 Ischemic cardiomyopathy: Secondary | ICD-10-CM

## 2020-08-20 NOTE — Progress Notes (Signed)
Metrics: Intervention Frequency ACO  Documented Smoking Status Yearly  Screened one or more times in 24 months  Cessation Counseling or  Active cessation medication Past 24 months  Past 24 months   Guideline developer: UpToDate (See UpToDate for funding source) Date Released: 2014       Wellness Office Visit  Subjective:  Patient ID: Omar Williams, male    DOB: [birth year]  Age: 85 y.o. MRN: [id number]  CC: This pleasant man comes in for follow-up of diabetes, hypertension. HPI  He is also describing a nagging sensation of pain/discomfort in the right groin area.  He previously has had a inguinal hernia there.  He denies any nausea or vomiting. As far as his diabetes is concerned he only takes Amaryl half a tablet when he feels he needs to so he does not take it every day and this is determined by his blood sugar levels. He has stopped taking all antihypertensive medications, carvedilol and losartan because his blood pressure was lower. Overall he feels well apart from a nagging sensation in the right inguina area. Past Medical History:  Diagnosis Date   Acid reflux    Blood transfusion without reported diagnosis    Cardiac arrest (HCC)    a. occuring in 12/2016. LAD disease but no targets for CABG or PCI. Underwent ICD placement as EF 15-20%.    CHF (congestive heart failure) (HCC)    Coronary artery disease    Diabetes mellitus without complication (HCC)    Hyperlipidemia    Hypertension    Myocardial infarction [facility])    Renal disorder    Past Surgical History:  Procedure Laterality Date   BIV ICD INSERTION CRT-D N/A 01/01/2017   Procedure: BIV ICD INSERTION CRT-D;  Surgeon: Regan Lemming, MD;  Location: [facility];  Service: Cardiovascular;  Laterality: N/A;     Family History  Problem Relation Age of Onset   Hypertension Mother    Hyperlipidemia Mother    CAD Sister    Diabetes Sister    Hyperlipidemia Sister    Hypertension Sister    CAD Brother     Diabetes Brother    Hypertension Brother    Hyperlipidemia Brother    CAD Brother    Diabetes Brother    Hypertension Brother    Hyperlipidemia Brother    Dementia Brother    Thyroid disease Daughter    Hypertension Daughter    Diabetes Daughter    Hyperlipidemia Daughter    Fibromyalgia Daughter    Heart disease Daughter        stents   Multiple sclerosis Daughter    Thyroid disease Daughter     Social History   Social History Narrative   Lives with with wife [name]   Married 60 years   Daughter [name] stays frequently   Granddaughter [name] near by   Social History   Tobacco Use   Smoking status: Former    Types: Cigarettes    Quit date: 1960    Years since quitting: 62.5   Smokeless tobacco: Never   Tobacco comments:    2-3 years as a teenager  Substance Use Topics   Alcohol use: No    Current Meds  Medication Sig   aspirin EC 81 MG tablet Take 81 mg by mouth daily.   atorvastatin (LIPITOR) 80 MG tablet TAKE 1 TABLET BY MOUTH EVERYDAY AT BEDTIME   Bee Pollen 550 MG CAPS Take 2 capsules by mouth 2 (two) times daily.  blood glucose meter kit and supplies KIT Test daily.  Dx e11.22   chlorhexidine (PERIDEX) 0.12 % solution as needed.   Cholecalciferol (VITAMIN D3) 125 MCG (5000 UT) TABS Take 1 tablet by mouth daily.    CINNAMON PO Take 1,000 mg by mouth in the morning and at bedtime.   ezetimibe (ZETIA) 10 MG tablet TAKE 1 TABLET BY MOUTH EVERY DAY   FIBER ADULT GUMMIES PO Take 2 tablets by mouth at bedtime.   finasteride (PROSCAR) 5 MG tablet Take 1 tablet (5 mg total) by mouth at bedtime.   furosemide (LASIX) 20 MG tablet TAKE 1 TABLET DAILY AS NEEDED FOR SWELLING   glimepiride (AMARYL) 4 MG tablet TAKE 1 TABLET (4 MG TOTAL) BY MOUTH IN THE MORNING AND AT BEDTIME. (Patient taking differently: Take 2 mg by mouth daily with breakfast.)   nitroGLYCERIN (NITROSTAT) 0.4 MG SL tablet Place 1 tablet (0.4 mg total) under the tongue every 5 (five) minutes x 3 doses  as needed for chest pain.   Omega-3 Fatty Acids (FISH OIL) 1200 MG CAPS Take 1 capsule by mouth 2 (two) times daily.    ONETOUCH ULTRA test strip TEST ONE TIME DAILY E11.9   oxybutynin (DITROPAN) 5 MG tablet Take 0.5 tablets (2.5 mg total) by mouth 3 (three) times daily.   senna-docusate (SENOKOT-S) 8.6-50 MG tablet Take 1 tablet by mouth 2 (two) times daily as needed for mild constipation.   tamsulosin (FLOMAX) 0.4 MG CAPS capsule Take 1 capsule (0.4 mg total) by mouth every morning.   TURMERIC PO Take 1,000 mg by mouth 2 (two) times daily.   vitamin B-12 (CYANOCOBALAMIN) 1000 MCG tablet Take 1,000 mcg by mouth every morning.    vitamin C (ASCORBIC ACID) 500 MG tablet Take 500 mg by mouth 2 (two) times daily.   [DISCONTINUED] carvedilol (COREG) 6.25 MG tablet TAKE 1 TABLET BY MOUTH TWICE A DAY   [DISCONTINUED] losartan (COZAAR) 25 MG tablet TAKE 1 TABLET BY MOUTH TWICE A DAY     Flowsheet Row Clinical Support from 06/12/2020 in [facility]  PHQ-9 Total Score 0       Objective:   Today's Vitals: BP 110/64   Pulse 66   Temp (!) 97.5 F (36.4 C) (Temporal)   Ht $R'5\' 7"'gy$  (1.702 m)   Wt 158 lb (71.7 kg)   SpO2 96%   BMI 24.75 kg/m  Vitals with BMI 08/20/2020 08/15/2020 07/19/2020  Height $Remov'5\' 7"'hjdVMh$  - $'5\' 7"'G$   Weight 158 lbs - 159 lbs 3 oz  BMI 36.14 - 43.15  Systolic 400 867 619  Diastolic 64 62 62  Pulse 66 91 70     Physical Exam He looks systemically well and relatively good for his age.  Blood pressure is excellent without any antihypertensive medications.  Examination of his inguinal area does show a fairly sizable right inguinal hernia with a cough impulse.      Assessment   1. Essential hypertension   2. Type 2 diabetes mellitus without complication, without long-term current use of insulin ([place])   3. Unilateral recurrent inguinal hernia without obstruction or gangrene       Tests ordered Orders Placed This Encounter  Procedures   Basic metabolic panel    Hemoglobin A1c   Ambulatory referral to General Surgery      Plan: 1.  Continue to monitor his hypertension without medications.  Check renal function. 2.  Continue with Amaryl 2 mg daily as needed and we will check an A1c  today. 3.  I will refer him to surgery regarding his right inguinal hernia. 4.  Follow-up in about 4 months    No orders of the defined types were placed in this encounter.   Doree Albee, MD

## 2020-08-21 LAB — BASIC METABOLIC PANEL
BUN/Creatinine Ratio: 18 (calc) (ref 6–22)
BUN: 24 mg/dL (ref 7–25)
CO2: 28 mmol/L (ref 20–32)
Calcium: 9.5 mg/dL (ref 8.6–10.3)
Chloride: 106 mmol/L (ref 98–110)
Creat: 1.34 mg/dL — ABNORMAL HIGH (ref 0.70–1.22)
Glucose, Bld: 110 mg/dL — ABNORMAL HIGH (ref 65–99)
Potassium: 4.4 mmol/L (ref 3.5–5.3)
Sodium: 142 mmol/L (ref 135–146)

## 2020-08-21 LAB — HEMOGLOBIN A1C
Hgb A1c MFr Bld: 7 % of total Hgb — ABNORMAL HIGH (ref <5.7)
Mean Plasma Glucose: 154 mg/dL
eAG (mmol/L): 8.5 mmol/L

## 2020-08-22 ENCOUNTER — Ambulatory Visit: Payer: Medicare Other | Admitting: Urology

## 2020-08-23 ENCOUNTER — Ambulatory Visit: Payer: Medicare Other | Admitting: Urology

## 2020-08-30 DIAGNOSIS — E1129 Type 2 diabetes mellitus with other diabetic kidney complication: Secondary | ICD-10-CM | POA: Diagnosis not present

## 2020-08-30 DIAGNOSIS — R809 Proteinuria, unspecified: Secondary | ICD-10-CM | POA: Diagnosis not present

## 2020-08-30 DIAGNOSIS — E1122 Type 2 diabetes mellitus with diabetic chronic kidney disease: Secondary | ICD-10-CM | POA: Diagnosis not present

## 2020-08-30 DIAGNOSIS — N189 Chronic kidney disease, unspecified: Secondary | ICD-10-CM | POA: Diagnosis not present

## 2020-08-30 DIAGNOSIS — I129 Hypertensive chronic kidney disease with stage 1 through stage 4 chronic kidney disease, or unspecified chronic kidney disease: Secondary | ICD-10-CM | POA: Diagnosis not present

## 2020-08-30 DIAGNOSIS — E872 Acidosis: Secondary | ICD-10-CM | POA: Diagnosis not present

## 2020-09-09 ENCOUNTER — Ambulatory Visit (INDEPENDENT_AMBULATORY_CARE_PROVIDER_SITE_OTHER): Payer: Medicare Other

## 2020-09-09 ENCOUNTER — Telehealth: Payer: Self-pay

## 2020-09-09 DIAGNOSIS — I5022 Chronic systolic (congestive) heart failure: Secondary | ICD-10-CM

## 2020-09-09 DIAGNOSIS — Z95810 Presence of automatic (implantable) cardiac defibrillator: Secondary | ICD-10-CM | POA: Diagnosis not present

## 2020-09-09 NOTE — Progress Notes (Signed)
EPIC Encounter for ICM Monitoring  Patient Name: Omar Williams is a 85 y.o. male Date: 09/09/2020 Primary Care Physican: Wilson Singer, MD Primary Cardiologist:  Branch Electrophysiologist: Allred Nephrologist: [facility] Bi-V Pacing: 98.8% 08/16/2020 Weight: 157 lbs   Spoke with patient and heart failure questions reviewed.  Pt asymptomatic for fluid accumulation and feeling well.  His wife has COVID and they have been eating restaurant foods daily which may contribute to possible fluid accumulation.  Optivol Thoracic impedance suggesting possible fluid accumulation starting 08/22/2020.   Prescribed:  Furosemide 20 mg take 1 tablet as needed for swelling.  Pt reports 8/8 taking Furosemide daily instead of PRN.   Labs: 08/20/2020 Creatinine 1.34, BUN 18, Potassium 4.4, Sodium 142 05/17/2020 Creatinine 1.17, BUN 14, Potassium 4.4, Sodium 143 05/09/2020 Creatinine 1.01, BUN 17, Potassium 4.0, Sodium 138, GFR 69-80 04/10/2020 Creatinine 1.07, BUN 16, Potassium 4.3, Sodium 139, GFR 64-75 03/30/2020 Creatinine 1.02, BUN 24, Potassium 4.2, Sodium 133, GFR 68-79 03/29/2020 Creatinine 1.24, BUN 28, Potassium 4.4, Sodium 135, GFR 54-62 Care Everywhere  A complete set of results can be found in Results Review.   Recommendations:  Pt taking PRN Furosemide daily instead of PRN.  Advised to limit salt intake and cut back on restaurant foods if possible.    Follow-up plan: ICM clinic phone appointment on 09/17/2020 (manual) to recheck fluid levels.   91 day device clinic remote transmission 10/09/2020.       EP/Cardiology Office Visits: 01/20/2021 with Rennis Harding, NP.       Copy of ICM check sent to Dr. Johney Frame and Dr Wyline Mood for review and recommendations if needed.   3 month ICM trend: 09/09/2020.    1 Year ICM trend:       Karie Soda, RN 09/09/2020 11:52 AM

## 2020-09-09 NOTE — Telephone Encounter (Signed)
Remote ICM transmission received.  Attempted call to patient regarding ICM remote transmission and left message to return call   

## 2020-09-10 ENCOUNTER — Other Ambulatory Visit: Payer: Self-pay

## 2020-09-10 ENCOUNTER — Ambulatory Visit (INDEPENDENT_AMBULATORY_CARE_PROVIDER_SITE_OTHER): Payer: Medicare Other | Admitting: General Surgery

## 2020-09-10 ENCOUNTER — Encounter: Payer: Self-pay | Admitting: General Surgery

## 2020-09-10 VITALS — BP 122/75 | HR 62 | Temp 97.6°F | Resp 16 | Ht 67.0 in | Wt 159.4 lb

## 2020-09-10 DIAGNOSIS — I255 Ischemic cardiomyopathy: Secondary | ICD-10-CM

## 2020-09-10 DIAGNOSIS — K4090 Unilateral inguinal hernia, without obstruction or gangrene, not specified as recurrent: Secondary | ICD-10-CM

## 2020-09-11 NOTE — Progress Notes (Signed)
Omar Williams; [id number]; 1936-01-26   HPI Patient is an 85 year old white male who was referred to my care by Dr. Anastasio Champion for evaluation and treatment of a right inguinal hernia.  Patient states he has had a right inguinal hernia for some time now.  It has increased in size a little bit.  It only bothers him when lying down and is sticking out.  It does reduce on its own.  He only complains of intermittent discomfort in the right inner thigh.  No nausea or vomiting have been noted.  It is not affecting his daily lifestyle. Past Medical History:  Diagnosis Date   Acid reflux    Blood transfusion without reported diagnosis    Cardiac arrest ([place])    a. occuring in 12/2016. LAD disease but no targets for CABG or PCI. Underwent ICD placement as EF 15-20%.    CHF (congestive heart failure) (HCC)    Coronary artery disease    Diabetes mellitus without complication ([place])    Hyperlipidemia    Hypertension    Myocardial infarction [facility])    Renal disorder     Past Surgical History:  Procedure Laterality Date   BIV ICD INSERTION CRT-D N/A 01/01/2017   Procedure: BIV ICD INSERTION CRT-D;  Surgeon: Constance Haw, MD;  Location: [facility];  Service: Cardiovascular;  Laterality: N/A;    Family History  Problem Relation Age of Onset   Hypertension Mother    Hyperlipidemia Mother    CAD Sister    Diabetes Sister    Hyperlipidemia Sister    Hypertension Sister    CAD Brother    Diabetes Brother    Hypertension Brother    Hyperlipidemia Brother    CAD Brother    Diabetes Brother    Hypertension Brother    Hyperlipidemia Brother    Dementia Brother    Thyroid disease Daughter    Hypertension Daughter    Diabetes Daughter    Hyperlipidemia Daughter    Fibromyalgia Daughter    Heart disease Daughter        stents   Multiple sclerosis Daughter    Thyroid disease Daughter     Current Outpatient Medications on File Prior to Visit  Medication Sig Dispense Refill   aspirin  EC 81 MG tablet Take 81 mg by mouth daily.     atorvastatin (LIPITOR) 80 MG tablet TAKE 1 TABLET BY MOUTH EVERYDAY AT BEDTIME 90 tablet 0   Bee Pollen 550 MG CAPS Take 2 capsules by mouth 2 (two) times daily.     blood glucose meter kit and supplies KIT Test daily.  Dx e11.22 1 each 0   chlorhexidine (PERIDEX) 0.12 % solution as needed.     Cholecalciferol (VITAMIN D3) 125 MCG (5000 UT) TABS Take 1 tablet by mouth daily.      CINNAMON PO Take 1,000 mg by mouth in the morning and at bedtime.     ezetimibe (ZETIA) 10 MG tablet TAKE 1 TABLET BY MOUTH EVERY DAY 90 tablet 1   FIBER ADULT GUMMIES PO Take 2 tablets by mouth at bedtime.     finasteride (PROSCAR) 5 MG tablet Take 1 tablet (5 mg total) by mouth at bedtime. 90 tablet 3   furosemide (LASIX) 20 MG tablet TAKE 1 TABLET DAILY AS NEEDED FOR SWELLING 90 tablet 1   glimepiride (AMARYL) 4 MG tablet TAKE 1 TABLET (4 MG TOTAL) BY MOUTH IN THE MORNING AND AT BEDTIME. (Patient taking differently: Take 2 mg by mouth  daily with breakfast.) 180 tablet 0   nitroGLYCERIN (NITROSTAT) 0.4 MG SL tablet Place 1 tablet (0.4 mg total) under the tongue every 5 (five) minutes x 3 doses as needed for chest pain. 25 tablet 3   Omega-3 Fatty Acids (FISH OIL) 1200 MG CAPS Take 1 capsule by mouth 2 (two) times daily.      ONETOUCH ULTRA test strip TEST ONE TIME DAILY E11.9 100 strip 2   oxybutynin (DITROPAN) 5 MG tablet Take 0.5 tablets (2.5 mg total) by mouth 3 (three) times daily. 135 tablet 3   senna-docusate (SENOKOT-S) 8.6-50 MG tablet Take 1 tablet by mouth 2 (two) times daily as needed for mild constipation. 180 tablet 0   tamsulosin (FLOMAX) 0.4 MG CAPS capsule Take 1 capsule (0.4 mg total) by mouth every morning. 90 capsule 3   TURMERIC PO Take 1,000 mg by mouth 2 (two) times daily.     vitamin B-12 (CYANOCOBALAMIN) 1000 MCG tablet Take 1,000 mcg by mouth every morning.      vitamin C (ASCORBIC ACID) 500 MG tablet Take 500 mg by mouth 2 (two) times daily.      No current facility-administered medications on file prior to visit.    No Known Allergies  Social History   Substance and Sexual Activity  Alcohol Use No    Social History   Tobacco Use  Smoking Status Former   Types: Cigarettes   Quit date: 1960   Years since quitting: 62.6  Smokeless Tobacco Never  Tobacco Comments   2-3 years as a teenager    Review of Systems  Constitutional: Negative.   HENT: Negative.    Eyes: Negative.   Respiratory: Negative.    Cardiovascular: Negative.   Gastrointestinal: Negative.   Genitourinary: Negative.   Musculoskeletal:  Positive for joint pain.  Skin: Negative.   Neurological: Negative.   Endo/Heme/Allergies: Negative.   Psychiatric/Behavioral: Negative.     Objective   Vitals:   09/10/20 1007  BP: 122/75  Pulse: 62  Resp: 16  Temp: 97.6 F (36.4 C)  SpO2: 93%    Physical Exam Vitals reviewed.  Constitutional:      Appearance: Normal appearance. He is normal weight. He is not ill-appearing.  HENT:     Head: Normocephalic and atraumatic.  Cardiovascular:     Rate and Rhythm: Normal rate and regular rhythm.     Heart sounds: Normal heart sounds. No murmur heard.   No gallop.  Pulmonary:     Effort: Pulmonary effort is normal. No respiratory distress.     Breath sounds: Normal breath sounds. No stridor. No wheezing, rhonchi or rales.  Abdominal:     General: Bowel sounds are normal. There is no distension.     Palpations: Abdomen is soft. There is no mass.     Tenderness: There is no abdominal tenderness. There is no guarding or rebound.     Hernia: A hernia is present.     Comments: Large right inguinal hernia that is easily reducible.  Weak left inguinal floor.  Genitourinary:    Testes: Normal.  Skin:    General: Skin is warm and dry.  Neurological:     Mental Status: He is alert and oriented to person, place, and time.   Recent office visit notes reviewed, cardiology note reviewed Assessment  Right  inguinal hernia, currently minimally symptomatic.  He also has a weak left inguinal floor.  He has multiple comorbidities including heart disease, AICD in place, and renal disease.  The  risk of incarceration is low at this point.  It does not seem to be affecting his daily lifestyle too much. Plan  At this point, I would not recommend surgical intervention and the patient is fine with that.  He will monitor the hernia.  He was instructed on techniques to reduce the hernia.  Should he become more symptomatic, he was told to return to my care.  Follow-up as needed.

## 2020-09-12 ENCOUNTER — Other Ambulatory Visit: Payer: Self-pay

## 2020-09-12 ENCOUNTER — Ambulatory Visit (INDEPENDENT_AMBULATORY_CARE_PROVIDER_SITE_OTHER): Payer: Medicare Other | Admitting: Family Medicine

## 2020-09-12 ENCOUNTER — Encounter: Payer: Self-pay | Admitting: Family Medicine

## 2020-09-12 VITALS — BP 114/67 | HR 61 | Temp 97.3°F | Ht 67.0 in | Wt 158.8 lb

## 2020-09-12 DIAGNOSIS — I252 Old myocardial infarction: Secondary | ICD-10-CM | POA: Diagnosis not present

## 2020-09-12 DIAGNOSIS — I255 Ischemic cardiomyopathy: Secondary | ICD-10-CM | POA: Diagnosis not present

## 2020-09-12 DIAGNOSIS — E559 Vitamin D deficiency, unspecified: Secondary | ICD-10-CM | POA: Diagnosis not present

## 2020-09-12 DIAGNOSIS — I10 Essential (primary) hypertension: Secondary | ICD-10-CM | POA: Diagnosis not present

## 2020-09-12 DIAGNOSIS — Z95810 Presence of automatic (implantable) cardiac defibrillator: Secondary | ICD-10-CM | POA: Insufficient documentation

## 2020-09-12 DIAGNOSIS — I5022 Chronic systolic (congestive) heart failure: Secondary | ICD-10-CM | POA: Diagnosis not present

## 2020-09-12 DIAGNOSIS — N1832 Chronic kidney disease, stage 3b: Secondary | ICD-10-CM | POA: Diagnosis not present

## 2020-09-12 DIAGNOSIS — E1122 Type 2 diabetes mellitus with diabetic chronic kidney disease: Secondary | ICD-10-CM

## 2020-09-12 DIAGNOSIS — N183 Chronic kidney disease, stage 3 unspecified: Secondary | ICD-10-CM | POA: Diagnosis not present

## 2020-09-12 DIAGNOSIS — E785 Hyperlipidemia, unspecified: Secondary | ICD-10-CM | POA: Diagnosis not present

## 2020-09-12 DIAGNOSIS — N401 Enlarged prostate with lower urinary tract symptoms: Secondary | ICD-10-CM | POA: Diagnosis not present

## 2020-09-12 DIAGNOSIS — I25118 Atherosclerotic heart disease of native coronary artery with other forms of angina pectoris: Secondary | ICD-10-CM

## 2020-09-12 DIAGNOSIS — N138 Other obstructive and reflux uropathy: Secondary | ICD-10-CM

## 2020-09-12 NOTE — Progress Notes (Signed)
New Patient Office Visit  Assessment & Plan:  1. Type 2 diabetes mellitus with stage 3 chronic kidney disease, without long-term current use of insulin, unspecified whether stage 3a or 3b CKD (HCC) Lab Results  Component Value Date   HGBA1C 7.0 (H) 08/20/2020   HGBA1C 7.4 (H) 05/09/2020   HGBA1C 7.7 (H) 01/11/2020    - Diabetes is at goal of A1c in the 7s. - Medications: continue current medications - Home glucose monitoring: Continue monitoring - Patient is currently taking a statin. Patient is not taking an ACE-inhibitor/ARB.  - Instruction/counseling given: reminded to get eye exam and discussed foot care  Diabetes Health Maintenance Due  Topic Date Due   OPHTHALMOLOGY EXAM  09/17/2019   URINE MICROALBUMIN  08/07/2020   HEMOGLOBIN A1C  02/20/2021   FOOT EXAM  09/12/2021    Lab Results  Component Value Date   LABMICR See below: 08/15/2020   MICROALBUR 14.6 08/08/2019   MICROALBUR 17.8 04/17/2019   - Microalbumin / creatinine urine ratio  2. Coronary artery disease of native artery of native heart with stable angina pectoris [facility]) Managed by cardiology.  He is on a statin.  3. Essential hypertension Managed by cardiology. Well controlled on current regimen.   4. Chronic systolic heart failure (HCC) Managed by cardiology.  5. Ischemic cardiomyopathy Managed by cardiology.  6. Hyperlipidemia LDL goal <70 Well controlled on current regimen.  Managed by cardiology.  7. History of non-ST elevation myocardial infarction (NSTEMI) He is on a statin.  8. Stage 3b chronic kidney disease (HCC) Managed by nephrology.  9. BPH with obstruction/lower urinary tract symptoms Managed by urology.  10. Vitamin D deficiency Well controlled on current regimen.    Follow-up: Return in about 3 months (around 12/13/2020) for follow-up of chronic medication conditions.   Deliah Boston, MSN, APRN, FNP-C [facility]  Subjective:  Patient ID: Omar Williams, male    DOB: 22-Sep-1935  Age: 85 y.o. MRN: [id number]  Patient Care Team: Gwenlyn Fudge, FNP as PCP - General (Family Medicine) Hillis Range, MD as PCP - Electrophysiology (Cardiology)  CC:  Chief Complaint  Patient presents with   New Patient (Initial Visit)    Dr. Karilyn Cota   Establish Care    HPI Omar Williams presents to establish care.   Diabetes: Patient presents for follow up of diabetes. Current symptoms include: none. Known diabetic complications: cardiovascular disease. Medication compliance: takes 1/2 glimepiride if blood sugar is >150. He does this a couple of times per week. Current diet: in general, a "healthy" diet  . Current exercise: walking and weightlifting. Home blood sugar records: BGs range between 88 and 129 . Is he  on ACE inhibitor or angiotensin II receptor blocker? No. Is he on a statin? Yes.   Hypertension: patient does monitor his blood pressure at home. Reports readings are normally 110s/60s. He does eat low salt and exercise regularly.   He has seen Dr. Lovell Sheehan for a right inguinal hernia, who did not recommend surgery.  He goes to Dr. Wolfgang Phoenix, [facility], due to CKD.  He sees Dr. Annabell Howells, urologist, for BPH and history of kidney stones.  He goes to cardiology due to CHF, presence of ICD, CAD, ischemic cardiomyopathy, hypertension, and hyperlipidemia.    Review of Systems  Constitutional:  Negative for chills, fever, malaise/fatigue and weight loss.  HENT:  Negative for congestion, ear discharge, ear pain, nosebleeds, sinus pain, sore throat and tinnitus.   Eyes:  Negative for  blurred vision, double vision, pain, discharge and redness.  Respiratory:  Negative for cough, shortness of breath and wheezing.   Cardiovascular:  Negative for chest pain, palpitations and leg swelling.  Gastrointestinal:  Negative for abdominal pain, constipation, diarrhea, heartburn, nausea and vomiting.  Genitourinary:  Negative for dysuria,  frequency and urgency.  Musculoskeletal:  Negative for myalgias.  Skin:  Negative for rash.  Neurological:  Negative for dizziness, seizures, weakness and headaches.  Psychiatric/Behavioral:  Negative for depression, substance abuse and suicidal ideas. The patient is not nervous/anxious.    Current Outpatient Medications:    aspirin EC 81 MG tablet, Take 81 mg by mouth daily., Disp: , Rfl:    atorvastatin (LIPITOR) 80 MG tablet, TAKE 1 TABLET BY MOUTH EVERYDAY AT BEDTIME, Disp: 90 tablet, Rfl: 0   Bee Pollen 550 MG CAPS, Take 2 capsules by mouth 2 (two) times daily., Disp: , Rfl:    chlorhexidine (PERIDEX) 0.12 % solution, as needed., Disp: , Rfl:    Cholecalciferol (VITAMIN D3) 125 MCG (5000 UT) TABS, Take 1 tablet by mouth daily. , Disp: , Rfl:    CINNAMON PO, Take 1,000 mg by mouth in the morning and at bedtime., Disp: , Rfl:    ezetimibe (ZETIA) 10 MG tablet, TAKE 1 TABLET BY MOUTH EVERY DAY, Disp: 90 tablet, Rfl: 1   FIBER ADULT GUMMIES PO, Take 2 tablets by mouth at bedtime., Disp: , Rfl:    finasteride (PROSCAR) 5 MG tablet, Take 1 tablet (5 mg total) by mouth at bedtime., Disp: 90 tablet, Rfl: 3   furosemide (LASIX) 20 MG tablet, TAKE 1 TABLET DAILY AS NEEDED FOR SWELLING, Disp: 90 tablet, Rfl: 1   glimepiride (AMARYL) 4 MG tablet, TAKE 1 TABLET (4 MG TOTAL) BY MOUTH IN THE MORNING AND AT BEDTIME. (Patient taking differently: Take 2 mg by mouth daily with breakfast.), Disp: 180 tablet, Rfl: 0   nitroGLYCERIN (NITROSTAT) 0.4 MG SL tablet, Place 1 tablet (0.4 mg total) under the tongue every 5 (five) minutes x 3 doses as needed for chest pain., Disp: 25 tablet, Rfl: 3   Omega-3 Fatty Acids (FISH OIL) 1200 MG CAPS, Take 1 capsule by mouth 2 (two) times daily. , Disp: , Rfl:    ONETOUCH ULTRA test strip, TEST ONE TIME DAILY E11.9, Disp: 100 strip, Rfl: 2   oxybutynin (DITROPAN) 5 MG tablet, Take 0.5 tablets (2.5 mg total) by mouth 3 (three) times daily., Disp: 135 tablet, Rfl: 3    senna-docusate (SENOKOT-S) 8.6-50 MG tablet, Take 1 tablet by mouth 2 (two) times daily as needed for mild constipation., Disp: 180 tablet, Rfl: 0   tamsulosin (FLOMAX) 0.4 MG CAPS capsule, Take 1 capsule (0.4 mg total) by mouth every morning., Disp: 90 capsule, Rfl: 3   TURMERIC PO, Take 1,000 mg by mouth 2 (two) times daily., Disp: , Rfl:    vitamin B-12 (CYANOCOBALAMIN) 1000 MCG tablet, Take 1,000 mcg by mouth every morning. , Disp: , Rfl:   No Known Allergies  Past Medical History:  Diagnosis Date   Acid reflux    Blood transfusion without reported diagnosis    Cardiac arrest (HCC)    a. occuring in 12/2016. LAD disease but no targets for CABG or PCI. Underwent ICD placement as EF 15-20%.    CHF (congestive heart failure) (HCC)    Coronary artery disease    Diabetes mellitus without complication (HCC)    Hyperlipidemia    Hypertension    Myocardial infarction [facility])    Renal  disorder     Past Surgical History:  Procedure Laterality Date   BIV ICD INSERTION CRT-D N/A 01/01/2017   Procedure: BIV ICD INSERTION CRT-D;  Surgeon: Regan Lemming, MD;  Location: [facility] INV[facility]IVE [facility];  Service: Cardiovascular;  Laterality: N/A;    Family History  Problem Relation Age of Onset   Hypertension Mother    Hyperlipidemia Mother    CAD Sister    Diabetes Sister    Hyperlipidemia Sister    Hypertension Sister    CAD Brother    Diabetes Brother    Hypertension Brother    Hyperlipidemia Brother    CAD Brother    Diabetes Brother    Hypertension Brother    Hyperlipidemia Brother    Dementia Brother    Thyroid disease Daughter    Hypertension Daughter    Diabetes Daughter    Hyperlipidemia Daughter    Fibromyalgia Daughter    Heart disease Daughter        stents   Multiple sclerosis Daughter    Thyroid disease Daughter     Social History   Socioeconomic History   Marital status: Married    Spouse name: Haematologist   Number of children: 3   Years of education: 12   Highest  education level: Some college, no degree  Occupational History   Occupation: Health and safety inspector   retired   Occupation: Education officer, environmental  Tobacco Use   Smoking status: Former    Types: Cigarettes    Quit date: 1960    Years since quitting: 62.6   Smokeless tobacco: Never   Tobacco comments:    2-3 years as a teenager  Building services engineer Use: Never used  Substance and Sexual Activity   Alcohol use: No   Drug use: No   Sexual activity: Not Currently  Other Topics Concern   Not on file  Social History Narrative   Lives with with wife [name]   Married 60 years   Daughter [name] stays frequently   Passenger transport manager near by   Social Determinants of Corporate investment banker Strain: Not on file  Food Insecurity: Not on file  Transportation Needs: Not on file  Physical Activity: Not on file  Stress: Not on file  Social Connections: Not on file  Intimate Partner Violence: Not on file    Objective:   Today's Vitals: BP 114/67   Pulse 61   Temp (!) 97.3 F (36.3 C) (Temporal)   Ht 5\' 7"  (1.702 m)   Wt 158 lb 12.8 oz (72 kg)   SpO2 97%   BMI 24.87 kg/m   Physical Exam Vitals reviewed.  Constitutional:      General: He is not in acute distress.    Appearance: Normal appearance. He is normal weight. He is not ill-appearing, toxic-appearing or diaphoretic.  HENT:     Head: Normocephalic and atraumatic.  Eyes:     General: No scleral icterus.       Right eye: No discharge.        Left eye: No discharge.     Conjunctiva/sclera: Conjunctivae normal.  Cardiovascular:     Rate and Rhythm: Normal rate and regular rhythm.     Heart sounds: Normal heart sounds. No murmur heard.   No friction rub. No gallop.  Pulmonary:     Effort: Pulmonary effort is normal. No respiratory distress.     Breath sounds: Normal breath sounds. No stridor. No wheezing, rhonchi or rales.  Musculoskeletal:  General: Normal range of motion.     Cervical back: Normal range of motion.  Skin:     General: Skin is warm and dry.  Neurological:     Mental Status: He is alert and oriented to person, place, and time. Mental status is at baseline.  Psychiatric:        Mood and Affect: Mood normal.        Behavior: Behavior normal.        Thought Content: Thought content normal.        Judgment: Judgment normal.   Diabetic Foot Exam - Simple   Simple Foot Form Diabetic Foot exam was performed with the following findings: Yes 09/12/2020 11:46 AM  Visual Inspection No deformities, no ulcerations, no other skin breakdown bilaterally: Yes Sensation Testing Intact to touch and monofilament testing bilaterally: Yes Pulse Check Posterior Tibialis and Dorsalis pulse intact bilaterally: Yes Comments

## 2020-09-12 NOTE — Patient Instructions (Signed)
Reminder: please return after mid-September for your flu shot. If you receive this elsewhere, such as your pharmacy, please let us know so we can get this documented in your chart.   

## 2020-09-13 LAB — MICROALBUMIN / CREATININE URINE RATIO
Creatinine, Urine: 79.3 mg/dL
Microalb/Creat Ratio: 169 mg/g creat — ABNORMAL HIGH (ref 0–29)
Microalbumin, Urine: 134.1 ug/mL

## 2020-09-17 ENCOUNTER — Other Ambulatory Visit: Payer: Self-pay | Admitting: Family Medicine

## 2020-09-17 ENCOUNTER — Ambulatory Visit (INDEPENDENT_AMBULATORY_CARE_PROVIDER_SITE_OTHER): Payer: Medicare Other

## 2020-09-17 ENCOUNTER — Encounter: Payer: Self-pay | Admitting: Family Medicine

## 2020-09-17 DIAGNOSIS — E1121 Type 2 diabetes mellitus with diabetic nephropathy: Secondary | ICD-10-CM

## 2020-09-17 DIAGNOSIS — Z95810 Presence of automatic (implantable) cardiac defibrillator: Secondary | ICD-10-CM

## 2020-09-17 DIAGNOSIS — I5022 Chronic systolic (congestive) heart failure: Secondary | ICD-10-CM

## 2020-09-17 MED ORDER — LISINOPRIL 2.5 MG PO TABS: 2.5000 mg | ORAL_TABLET | Freq: Every day | ORAL | 2 refills | Status: DC

## 2020-09-19 ENCOUNTER — Telehealth: Payer: Self-pay

## 2020-09-19 NOTE — Telephone Encounter (Signed)
I spoke with the patient and he agreed to send missed HF transmission. Transmission received.

## 2020-09-20 NOTE — Progress Notes (Signed)
EPIC Encounter for ICM Monitoring  Patient Name: Omar Williams is a 85 y.o. male Date: 09/20/2020 Primary Care Physican: Gwenlyn Fudge, FNP Primary Cardiologist:  Wyline Mood Electrophysiologist: Allred Nephrologist: [facility] Bi-V Pacing: 98.8% 08/16/2020 Weight: 157 lbs   Spoke with patient and heart failure questions reviewed.  Pt asymptomatic for fluid accumulation and feeling well.     Optivol Thoracic impedance suggesting fluid levels returned to normal.    Prescribed:  Furosemide 20 mg take 1 tablet as needed for swelling.  Pt reports 8/8 taking Furosemide daily instead of PRN.   Labs: 08/20/2020 Creatinine 1.34, BUN 18, Potassium 4.4, Sodium 142 05/17/2020 Creatinine 1.17, BUN 14, Potassium 4.4, Sodium 143 05/09/2020 Creatinine 1.01, BUN 17, Potassium 4.0, Sodium 138, GFR 69-80 04/10/2020 Creatinine 1.07, BUN 16, Potassium 4.3, Sodium 139, GFR 64-75 03/30/2020 Creatinine 1.02, BUN 24, Potassium 4.2, Sodium 133, GFR 68-79 03/29/2020 Creatinine 1.24, BUN 28, Potassium 4.4, Sodium 135, GFR 54-62 Care Everywhere  A complete set of results can be found in Results Review.   Recommendations:  No changes and encouraged to call if experiencing any fluid symptoms.   Follow-up plan: ICM clinic phone appointment on 10/10/2020.   91 day device clinic remote transmission 10/09/2020.       EP/Cardiology Office Visits: 01/20/2021 with Rennis Harding, NP.       Copy of ICM check sent to Dr. Johney Frame.   3 month ICM trend: 09/19/2020.    1 Year ICM trend:       Karie Soda, RN 09/20/2020 8:56 AM

## 2020-10-09 ENCOUNTER — Ambulatory Visit (INDEPENDENT_AMBULATORY_CARE_PROVIDER_SITE_OTHER): Payer: Medicare Other

## 2020-10-09 DIAGNOSIS — I255 Ischemic cardiomyopathy: Secondary | ICD-10-CM

## 2020-10-09 LAB — CUP PACEART REMOTE DEVICE CHECK
Battery Remaining Longevity: 41 mo
Battery Voltage: 2.96 V
Brady Statistic AP VP Percent: 48.61 %
Brady Statistic AP VS Percent: 0.04 %
Brady Statistic AS VP Percent: 50.51 %
Brady Statistic AS VS Percent: 0.85 %
Brady Statistic RA Percent Paced: 48.61 %
Brady Statistic RV Percent Paced: 93.75 %
Date Time Interrogation Session: 20220907033427
HighPow Impedance: 76 Ohm
Implantable Lead Implant Date: 20181130
Implantable Lead Implant Date: 20181130
Implantable Lead Implant Date: 20181130
Implantable Lead Location: 753858
Implantable Lead Location: 753859
Implantable Lead Location: 753860
Implantable Lead Model: 4598
Implantable Lead Model: 5076
Implantable Lead Model: 6935
Implantable Pulse Generator Implant Date: 20181130
Lead Channel Impedance Value: 141.867
Lead Channel Impedance Value: 141.867
Lead Channel Impedance Value: 152 Ohm
Lead Channel Impedance Value: 152 Ohm
Lead Channel Impedance Value: 152 Ohm
Lead Channel Impedance Value: 266 Ohm
Lead Channel Impedance Value: 304 Ohm
Lead Channel Impedance Value: 304 Ohm
Lead Channel Impedance Value: 304 Ohm
Lead Channel Impedance Value: 342 Ohm
Lead Channel Impedance Value: 342 Ohm
Lead Channel Impedance Value: 437 Ohm
Lead Channel Impedance Value: 494 Ohm
Lead Channel Impedance Value: 494 Ohm
Lead Channel Impedance Value: 494 Ohm
Lead Channel Impedance Value: 494 Ohm
Lead Channel Impedance Value: 513 Ohm
Lead Channel Impedance Value: 532 Ohm
Lead Channel Pacing Threshold Amplitude: 0.375 V
Lead Channel Pacing Threshold Amplitude: 0.75 V
Lead Channel Pacing Threshold Amplitude: 1.375 V
Lead Channel Pacing Threshold Pulse Width: 0.4 ms
Lead Channel Pacing Threshold Pulse Width: 0.4 ms
Lead Channel Pacing Threshold Pulse Width: 0.4 ms
Lead Channel Sensing Intrinsic Amplitude: 2.625 mV
Lead Channel Sensing Intrinsic Amplitude: 2.625 mV
Lead Channel Sensing Intrinsic Amplitude: 9.625 mV
Lead Channel Sensing Intrinsic Amplitude: 9.625 mV
Lead Channel Setting Pacing Amplitude: 2 V
Lead Channel Setting Pacing Amplitude: 2 V
Lead Channel Setting Pacing Amplitude: 2.5 V
Lead Channel Setting Pacing Pulse Width: 0.4 ms
Lead Channel Setting Pacing Pulse Width: 0.4 ms
Lead Channel Setting Sensing Sensitivity: 0.3 mV

## 2020-10-10 ENCOUNTER — Ambulatory Visit (INDEPENDENT_AMBULATORY_CARE_PROVIDER_SITE_OTHER): Payer: Medicare Other

## 2020-10-10 DIAGNOSIS — I5022 Chronic systolic (congestive) heart failure: Secondary | ICD-10-CM | POA: Diagnosis not present

## 2020-10-10 DIAGNOSIS — Z95810 Presence of automatic (implantable) cardiac defibrillator: Secondary | ICD-10-CM

## 2020-10-11 NOTE — Progress Notes (Signed)
EPIC Encounter for ICM Monitoring  Patient Name: Omar Williams is a 85 y.o. male Date: 10/11/2020 Primary Care Physican: Gwenlyn Fudge, FNP Primary Cardiologist:  Wyline Mood Electrophysiologist: Allred Nephrologist: [facility] Bi-V Pacing: 98.8% 08/16/2020 Weight: 157 lbs   Spoke with patient and heart failure questions reviewed.  Pt asymptomatic for fluid accumulation and feeling well.  Pt currently in [place] for next week due to his brother is sick and will be entering hospice this week.   Optivol Thoracic impedance suggesting normal fluid levels.   Prescribed:  Furosemide 20 mg take 1 tablet as needed for swelling.  Pt reports 8/8 taking Furosemide daily instead of PRN.   Labs: 08/20/2020 Creatinine 1.34, BUN 18, Potassium 4.4, Sodium 142 05/17/2020 Creatinine 1.17, BUN 14, Potassium 4.4, Sodium 143 05/09/2020 Creatinine 1.01, BUN 17, Potassium 4.0, Sodium 138, GFR 69-80 04/10/2020 Creatinine 1.07, BUN 16, Potassium 4.3, Sodium 139, GFR 64-75 03/30/2020 Creatinine 1.02, BUN 24, Potassium 4.2, Sodium 133, GFR 68-79 03/29/2020 Creatinine 1.24, BUN 28, Potassium 4.4, Sodium 135, GFR 54-62 Care Everywhere  A complete set of results can be found in Results Review.   Recommendations:  No changes and encouraged to call if experiencing any fluid symptoms.   Follow-up plan: ICM clinic phone appointment on 11/11/2020.   91 day device clinic remote transmission 01/08/2021.       EP/Cardiology Office Visits: 01/20/2021 with Rennis Harding, NP.       Copy of ICM check sent to Dr. Johney Frame.    3 month ICM trend: 10/09/2020.    1 Year ICM trend:       Karie Soda, RN 10/11/2020 11:58 AM

## 2020-10-17 NOTE — Progress Notes (Signed)
Remote ICD transmission.   

## 2020-11-03 ENCOUNTER — Other Ambulatory Visit: Payer: Self-pay | Admitting: Cardiology

## 2020-11-06 DIAGNOSIS — E119 Type 2 diabetes mellitus without complications: Secondary | ICD-10-CM | POA: Diagnosis not present

## 2020-11-06 LAB — HM DIABETES EYE EXAM

## 2020-11-08 ENCOUNTER — Other Ambulatory Visit (INDEPENDENT_AMBULATORY_CARE_PROVIDER_SITE_OTHER): Payer: Self-pay | Admitting: Nurse Practitioner

## 2020-11-08 DIAGNOSIS — I10 Essential (primary) hypertension: Secondary | ICD-10-CM

## 2020-11-08 DIAGNOSIS — E559 Vitamin D deficiency, unspecified: Secondary | ICD-10-CM

## 2020-11-08 DIAGNOSIS — Z23 Encounter for immunization: Secondary | ICD-10-CM | POA: Diagnosis not present

## 2020-11-11 ENCOUNTER — Ambulatory Visit (INDEPENDENT_AMBULATORY_CARE_PROVIDER_SITE_OTHER): Payer: Medicare Other

## 2020-11-11 DIAGNOSIS — I5022 Chronic systolic (congestive) heart failure: Secondary | ICD-10-CM | POA: Diagnosis not present

## 2020-11-11 DIAGNOSIS — Z95810 Presence of automatic (implantable) cardiac defibrillator: Secondary | ICD-10-CM | POA: Diagnosis not present

## 2020-11-12 NOTE — Progress Notes (Signed)
EPIC Encounter for ICM Monitoring  Patient Name: Omar Williams is a 85 y.o. male Date: 11/12/2020 Primary Care Physican: Gwenlyn Fudge, FNP Primary Cardiologist:  Wyline Mood Electrophysiologist: Allred Nephrologist: [facility] Bi-V Pacing: 98.7% 11/12/2020 Weight: 157 lbs   Spoke with patient and heart failure questions reviewed.  Pt asymptomatic for fluid accumulation and feeling well.  He has been traveling some over the last month   Optivol Thoracic impedance suggesting normal fluid levels.   Prescribed:  Furosemide 20 mg take 1 tablet as needed for swelling.  Pt reports 8/8 taking Furosemide daily instead of PRN.   Labs: 08/20/2020 Creatinine 1.34, BUN 18, Potassium 4.4, Sodium 142 05/17/2020 Creatinine 1.17, BUN 14, Potassium 4.4, Sodium 143 05/09/2020 Creatinine 1.01, BUN 17, Potassium 4.0, Sodium 138, GFR 69-80 04/10/2020 Creatinine 1.07, BUN 16, Potassium 4.3, Sodium 139, GFR 64-75 03/30/2020 Creatinine 1.02, BUN 24, Potassium 4.2, Sodium 133, GFR 68-79 03/29/2020 Creatinine 1.24, BUN 28, Potassium 4.4, Sodium 135, GFR 54-62 Care Everywhere  A complete set of results can be found in Results Review.   Recommendations:  No changes and encouraged to call if experiencing any fluid symptoms.   Follow-up plan: ICM clinic phone appointment on 12/16/2020.   91 day device clinic remote transmission 01/08/2021.       EP/Cardiology Office Visits: 01/20/2021 with Rennis Harding, NP.       Copy of ICM check sent to Dr. Johney Frame.     3 month ICM trend: 11/11/2020.    1 Year ICM trend:       Karie Soda, RN 11/12/2020 10:23 AM

## 2020-11-13 ENCOUNTER — Other Ambulatory Visit: Payer: Self-pay | Admitting: *Deleted

## 2020-11-13 DIAGNOSIS — E785 Hyperlipidemia, unspecified: Secondary | ICD-10-CM

## 2020-11-13 MED ORDER — ATORVASTATIN CALCIUM 80 MG PO TABS: ORAL_TABLET | ORAL | 1 refills | Status: DC

## 2020-11-13 NOTE — Telephone Encounter (Signed)
Vm from pt needing refill on his Atorvastatin, which was last filled by Dr. Karilyn Cota His last OV here was 09/12/20 Next OV 12/17/20. Please send to [facility]

## 2020-12-13 ENCOUNTER — Other Ambulatory Visit: Payer: Self-pay | Admitting: Family Medicine

## 2020-12-13 DIAGNOSIS — E1121 Type 2 diabetes mellitus with diabetic nephropathy: Secondary | ICD-10-CM

## 2020-12-16 ENCOUNTER — Ambulatory Visit (INDEPENDENT_AMBULATORY_CARE_PROVIDER_SITE_OTHER): Payer: Medicare Other

## 2020-12-16 DIAGNOSIS — I5022 Chronic systolic (congestive) heart failure: Secondary | ICD-10-CM

## 2020-12-16 DIAGNOSIS — Z95810 Presence of automatic (implantable) cardiac defibrillator: Secondary | ICD-10-CM

## 2020-12-17 ENCOUNTER — Ambulatory Visit: Payer: Medicare Other | Admitting: Family Medicine

## 2020-12-17 NOTE — Progress Notes (Signed)
EPIC Encounter for ICM Monitoring  Patient Name: Omar Williams is a 85 y.o. male Date: 12/17/2020 Primary Care Physican: Gwenlyn Fudge, FNP Primary Cardiologist:  Wyline Mood Electrophysiologist: Allred Nephrologist: [facility] Bi-V Pacing: 98.8% 11/12/2020 Weight: 157 lbs   Spoke with patient and heart failure questions reviewed.  Pt asymptomatic for fluid accumulation and feeling well.     Optivol Thoracic impedance suggesting normal fluid levels.   Prescribed:  Furosemide 20 mg take 1 tablet as needed for swelling.  Pt reports 8/8 taking Furosemide daily instead of PRN.   Labs: 08/20/2020 Creatinine 1.34, BUN 18, Potassium 4.4, Sodium 142 05/17/2020 Creatinine 1.17, BUN 14, Potassium 4.4, Sodium 143 05/09/2020 Creatinine 1.01, BUN 17, Potassium 4.0, Sodium 138, GFR 69-80 04/10/2020 Creatinine 1.07, BUN 16, Potassium 4.3, Sodium 139, GFR 64-75 03/30/2020 Creatinine 1.02, BUN 24, Potassium 4.2, Sodium 133, GFR 68-79 03/29/2020 Creatinine 1.24, BUN 28, Potassium 4.4, Sodium 135, GFR 54-62 Care Everywhere  A complete set of results can be found in Results Review.   Recommendations:  No changes and encouraged to call if experiencing any fluid symptoms.   Follow-up plan: ICM clinic phone appointment on 01/20/2021.   91 day device clinic remote transmission 01/08/2021.       EP/Cardiology Office Visits: 01/03/2021 with Dr Wyline Mood.       Copy of ICM check sent to Dr. Johney Frame.     3 month ICM trend: 12/16/2020.    12-14 Month ICM trend:       Karie Soda, RN 12/17/2020 12:09 PM

## 2020-12-22 ENCOUNTER — Other Ambulatory Visit: Payer: Self-pay | Admitting: Cardiology

## 2020-12-24 ENCOUNTER — Telehealth: Payer: Self-pay | Admitting: Family Medicine

## 2020-12-24 DIAGNOSIS — E119 Type 2 diabetes mellitus without complications: Secondary | ICD-10-CM

## 2020-12-24 MED ORDER — ONETOUCH ULTRA VI STRP: 1.0000 | ORAL_STRIP | Freq: Two times a day (BID) | 2 refills | Status: DC

## 2020-12-24 NOTE — Telephone Encounter (Signed)
Refill sent and left message advising to call back with any questions or concerns.

## 2020-12-24 NOTE — Telephone Encounter (Signed)
  Prescription Request  12/24/2020  Is this a "Controlled Substance" medicine? no  Have you seen your PCP in the last 2 weeks? no  If YES, route message to pool  -  If NO, patient needs to be scheduled for appointment.  What is the name of the medication or equipment? Test strips for One Touch Ultra  Have you contacted your pharmacy to request a refill? yes   Which pharmacy would you like this sent to? [facility]   Patient notified that their request is being sent to the clinical staff for review and that they should receive a response within 2 business days.    Joyce's pt.  Pt only has one left!

## 2021-01-03 ENCOUNTER — Encounter: Payer: Self-pay | Admitting: Cardiology

## 2021-01-03 ENCOUNTER — Encounter: Payer: Self-pay | Admitting: *Deleted

## 2021-01-03 ENCOUNTER — Ambulatory Visit (INDEPENDENT_AMBULATORY_CARE_PROVIDER_SITE_OTHER): Payer: Medicare Other | Admitting: Cardiology

## 2021-01-03 ENCOUNTER — Other Ambulatory Visit: Payer: Self-pay

## 2021-01-03 VITALS — BP 104/64 | HR 70 | Ht 69.0 in | Wt 169.0 lb

## 2021-01-03 DIAGNOSIS — E1129 Type 2 diabetes mellitus with other diabetic kidney complication: Secondary | ICD-10-CM | POA: Diagnosis not present

## 2021-01-03 DIAGNOSIS — I2510 Atherosclerotic heart disease of native coronary artery without angina pectoris: Secondary | ICD-10-CM | POA: Diagnosis not present

## 2021-01-03 DIAGNOSIS — I5022 Chronic systolic (congestive) heart failure: Secondary | ICD-10-CM | POA: Diagnosis not present

## 2021-01-03 DIAGNOSIS — E872 Acidosis, unspecified: Secondary | ICD-10-CM | POA: Diagnosis not present

## 2021-01-03 DIAGNOSIS — I255 Ischemic cardiomyopathy: Secondary | ICD-10-CM | POA: Diagnosis not present

## 2021-01-03 DIAGNOSIS — I129 Hypertensive chronic kidney disease with stage 1 through stage 4 chronic kidney disease, or unspecified chronic kidney disease: Secondary | ICD-10-CM | POA: Diagnosis not present

## 2021-01-03 DIAGNOSIS — R809 Proteinuria, unspecified: Secondary | ICD-10-CM | POA: Diagnosis not present

## 2021-01-03 DIAGNOSIS — E1122 Type 2 diabetes mellitus with diabetic chronic kidney disease: Secondary | ICD-10-CM | POA: Diagnosis not present

## 2021-01-03 DIAGNOSIS — N189 Chronic kidney disease, unspecified: Secondary | ICD-10-CM | POA: Diagnosis not present

## 2021-01-03 DIAGNOSIS — E782 Mixed hyperlipidemia: Secondary | ICD-10-CM

## 2021-01-03 NOTE — Patient Instructions (Signed)
Medication Instructions:  Continue all current medications.  Labwork: none  Testing/Procedures: Your physician has requested that you have an echocardiogram. Echocardiography is a painless test that uses sound waves to create images of your heart. It provides your doctor with information about the size and shape of your heart and how well your heart's chambers and valves are working. This procedure takes approximately one hour. There are no restrictions for this procedure. Office will contact with results via phone or letter.     Follow-Up: 4 months   Any Other Special Instructions Will Be Listed Below (If Applicable).   If you need a refill on your cardiac medications before your next appointment, please call your pharmacy.  

## 2021-01-03 NOTE — Progress Notes (Signed)
Clinical Summary Omar Williams is a 85 y.o.male seen today for follow up of the following medical problems.    1. ICM/Chronic systolic HF/CAD - history of LAD disease without target for revasc   - has BiV AICD, normal device check 10/2019 - 06/2017 echo LVEF 40-45%.  - medical therapy has been limited by low bp's. Has had some prior issues with hyperkalemia in the past    - had been on coreg 6.25mg  bid and losartan 25mg  daily at 07/2020 visit. Somewhere betwee June and July meds changed and now just on lisinopril 2.5mg  daily. Im not clear on this history.  - takes lasix 20mg  daily  2. HTN - compliant with meds     3. Hyperlipidemia - 08/2019 TC 134 HDL 43 TG 132 LDL 69 - he is on atorva 80mg  and zetia 10mg  - he is compliant with meds       4. CKD - followed by Dr Theador Hawthorne     Past Medical History:  Diagnosis Date   Acid reflux    Blood transfusion without reported diagnosis    Cardiac arrest [facility])    a. occuring in 12/2016. LAD disease but no targets for CABG or PCI. Underwent ICD placement as EF 15-20%.    CHF (congestive heart failure) (HCC)    Coronary artery disease    Diabetes mellitus without complication (HCC)    Diabetic nephropathy (HCC)    Hyperlipidemia    Hypertension    Myocardial infarction [facility])    Renal disorder      No Known Allergies   Current Outpatient Medications  Medication Sig Dispense Refill   ezetimibe (ZETIA) 10 MG tablet TAKE 1 TABLET BY MOUTH EVERY DAY 90 tablet 1   aspirin EC 81 MG tablet Take 81 mg by mouth daily.     atorvastatin (LIPITOR) 80 MG tablet TAKE 1 TABLET BY MOUTH EVERYDAY AT BEDTIME 90 tablet 1   Bee Pollen 550 MG CAPS Take 2 capsules by mouth 2 (two) times daily.     chlorhexidine (PERIDEX) 0.12 % solution as needed.     Cholecalciferol (VITAMIN D3) 125 MCG (5000 UT) TABS Take 1 tablet by mouth daily.      CINNAMON PO Take 1,000 mg by mouth in the morning and at bedtime.     FIBER ADULT GUMMIES PO Take 2 tablets by  mouth at bedtime.     finasteride (PROSCAR) 5 MG tablet Take 1 tablet (5 mg total) by mouth at bedtime. 90 tablet 3   furosemide (LASIX) 20 MG tablet TAKE 1 TABLET BY MOUTH DAILY AS NEEDED FOR SWELLING 90 tablet 1   glimepiride (AMARYL) 4 MG tablet TAKE 1 TABLET (4 MG TOTAL) BY MOUTH IN THE MORNING AND AT BEDTIME. (Patient taking differently: Take 2 mg by mouth daily with breakfast.) 180 tablet 0   glucose blood (ONETOUCH ULTRA) test strip 1 each by Other route 2 (two) times daily. Use as instructed Dx E11.9 100 strip 2   lisinopril (ZESTRIL) 2.5 MG tablet TAKE 1 TABLET BY MOUTH EVERY DAY 90 tablet 0   nitroGLYCERIN (NITROSTAT) 0.4 MG SL tablet Place 1 tablet (0.4 mg total) under the tongue every 5 (five) minutes x 3 doses as needed for chest pain. 25 tablet 3   Omega-3 Fatty Acids (FISH OIL) 1200 MG CAPS Take 1 capsule by mouth 2 (two) times daily.      oxybutynin (DITROPAN) 5 MG tablet Take 0.5 tablets (2.5 mg total) by mouth 3 (three)  times daily. 135 tablet 3   senna-docusate (SENOKOT-S) 8.6-50 MG tablet Take 1 tablet by mouth 2 (two) times daily as needed for mild constipation. 180 tablet 0   tamsulosin (FLOMAX) 0.4 MG CAPS capsule Take 1 capsule (0.4 mg total) by mouth every morning. 90 capsule 3   TURMERIC PO Take 1,000 mg by mouth 2 (two) times daily.     vitamin B-12 (CYANOCOBALAMIN) 1000 MCG tablet Take 1,000 mcg by mouth every morning.      No current facility-administered medications for this visit.     Past Surgical History:  Procedure Laterality Date   BIV ICD INSERTION CRT-D N/A 01/01/2017   Procedure: BIV ICD INSERTION CRT-D;  Surgeon: Regan Lemming, MD;  Location: [facility] INVASIVE CV LAB;  Service: Cardiovascular;  Laterality: N/A;     No Known Allergies    Family History  Problem Relation Age of Onset   Hypertension Mother    Hyperlipidemia Mother    CAD Sister    Diabetes Sister    Hyperlipidemia Sister    Hypertension Sister    CAD Brother    Diabetes  Brother    Hypertension Brother    Hyperlipidemia Brother    CAD Brother    Diabetes Brother    Hypertension Brother    Hyperlipidemia Brother    Dementia Brother    Thyroid disease Daughter    Hypertension Daughter    Diabetes Daughter    Hyperlipidemia Daughter    Fibromyalgia Daughter    Heart disease Daughter        stents   Multiple sclerosis Daughter    Thyroid disease Daughter      Social History Omar Williams reports that he quit smoking about 62 years ago. His smoking use included cigarettes. He has never used smokeless tobacco. Omar Williams reports no history of alcohol use.   Review of Systems CONSTITUTIONAL: No weight loss, fever, chills, weakness or fatigue.  HEENT: Eyes: No visual loss, blurred vision, double vision or yellow sclerae.No hearing loss, sneezing, congestion, runny nose or sore throat.  SKIN: No rash or itching.  CARDIOVASCULAR: per hpi RESPIRATORY: No shortness of breath, cough or sputum.  GASTROINTESTINAL: No anorexia, nausea, vomiting or diarrhea. No abdominal pain or blood.  GENITOURINARY: No burning on urination, no polyuria NEUROLOGICAL: No headache, dizziness, syncope, paralysis, ataxia, numbness or tingling in the extremities. No change in bowel or bladder control.  MUSCULOSKELETAL: No muscle, back pain, joint pain or stiffness.  LYMPHATICS: No enlarged nodes. No history of splenectomy.  PSYCHIATRIC: No history of depression or anxiety.  ENDOCRINOLOGIC: No reports of sweating, cold or heat intolerance. No polyuria or polydipsia.  Marland Kitchen   Physical Examination Today's Vitals   01/03/21 1420  BP: 104/64  Pulse: 70  SpO2: 97%  Weight: 169 lb (76.7 kg)  Height: 5\' 9"  (1.753 m)   Body mass index is 24.96 kg/m.  Gen: resting comfortably, no acute distress HEENT: no scleral icterus, pupils equal round and reactive, no palptable cervical adenopathy,  CV: RRR, no m/r/g, no jvd Resp: Clear to auscultation bilaterally GI: abdomen is soft, non-tender,  non-distended, normal bowel sounds, no hepatosplenomegaly MSK: extremities are warm, no edema.  Skin: warm, no rash Neuro:  no focal deficits Psych: appropriate affect   Diagnostic Studies  Echo 06/09/17:   Study Conclusions   - Left ventricle: The cavity size was normal. Wall thickness was   normal. Systolic function was mildly to moderately reduced. The   estimated ejection fraction was in the  range of 40% to 45%.   Diffuse hypokinesis. Doppler parameters are consistent with   abnormal left ventricular relaxation (grade 1 diastolic   dysfunction). - Aortic valve: Poorly visualized. Probably trileaflet. There was   mild regurgitation. - Mitral valve: There was trivial regurgitation. - Left atrium: The atrium was mildly dilated. - Right ventricle: Pacer wire or catheter noted in right ventricle. - Right atrium: Central venous pressure (est): 3 mm Hg. - Atrial septum: No defect or patent foramen ovale was identified. - Tricuspid valve: There was trivial regurgitation. - Pulmonary arteries: PA peak pressure: 13 mm Hg (S). - Pericardium, extracardiac: There was no pericardial effusion.   Impressions:   - Images are limited, but LVEF has improved in comparison to the   previous study in November 2018.   06/2017 echo Study Conclusions   - Left ventricle: The cavity size was normal. Wall thickness was    normal. Systolic function was mildly to moderately reduced. The    estimated ejection fraction was in the range of 40% to 45%.    Diffuse hypokinesis. Doppler parameters are consistent with    abnormal left ventricular relaxation (grade 1 diastolic    dysfunction).  - Aortic valve: Poorly visualized. Probably trileaflet. There was    mild regurgitation.  - Mitral valve: There was trivial regurgitation.  - Left atrium: The atrium was mildly dilated.  - Right ventricle: Pacer wire or catheter noted in right ventricle.  - Right atrium: Central venous pressure (est): 3 mm Hg.  -  Atrial septum: No defect or patent foramen ovale was identified.  - Tricuspid valve: There was trivial regurgitation.  - Pulmonary arteries: PA peak pressure: 13 mm Hg (S).  - Pericardium, extracardiac: There was no pericardial effusion.   Assessment and Plan  1. CAD/Chronic systolic HF/ICM - denies any symptoms - he is off his coreg and losartan and just on lisinorpil 2.5mg  daily, I am not clear when this was done and for what reason. I would assume perhaps due to low bp's - repeat echo, pending LVEF could consider farxiga, perhaps very low dose toprol.    2. Hyperlipidemia - continue current meds, has been at goal. Request labs from pcp.       Arnoldo Lenis, M.D.

## 2021-01-06 NOTE — Progress Notes (Signed)
Electrophysiology Office Note Date: 01/07/2021  ID:  Omar Williams, Omar Williams 12-09-1935, MRN [id number]  PCP: Gwenlyn Fudge, FNP Primary Cardiologist: None Electrophysiologist: Hillis Range, MD   CC: Routine ICD follow-up  Omar Williams is a 85 y.o. male seen today for Hillis Range, MD for routine electrophysiology followup.  Since last being seen in our clinic the patient reports doing very well.  he denies chest pain, palpitations, dyspnea, PND, orthopnea, nausea, vomiting, dizziness, syncope, edema, weight gain, or early satiety. He has not had ICD shocks.   Device History: Medtronic BiV ICD implanted 12/2016 for chronic systolic CHF   Past Medical History:  Diagnosis Date   Acid reflux    Blood transfusion without reported diagnosis    Cardiac arrest (HCC)    a. occuring in 12/2016. LAD disease but no targets for CABG or PCI. Underwent ICD placement as EF 15-20%.    CHF (congestive heart failure) (HCC)    Coronary artery disease    Diabetes mellitus without complication (HCC)    Diabetic nephropathy (HCC)    Hyperlipidemia    Hypertension    Myocardial infarction [facility])    Renal disorder    Past Surgical History:  Procedure Laterality Date   BIV ICD INSERTION CRT-D N/A 01/01/2017   Procedure: BIV ICD INSERTION CRT-D;  Surgeon: Regan Lemming, MD;  Location: [facility] INVASIVE CV LAB;  Service: Cardiovascular;  Laterality: N/A;    Current Outpatient Medications  Medication Sig Dispense Refill   aspirin EC 81 MG tablet Take 81 mg by mouth daily.     atorvastatin (LIPITOR) 80 MG tablet TAKE 1 TABLET BY MOUTH EVERYDAY AT BEDTIME 90 tablet 1   Bee Pollen 550 MG CAPS Take 2 capsules by mouth 2 (two) times daily.     chlorhexidine (PERIDEX) 0.12 % solution as needed.     Cholecalciferol (VITAMIN D3) 125 MCG (5000 UT) TABS Take 1 tablet by mouth daily.      CINNAMON PO Take 1,000 mg by mouth in the morning and at bedtime.     ezetimibe (ZETIA) 10 MG tablet TAKE 1 TABLET BY MOUTH  EVERY DAY 90 tablet 1   FIBER ADULT GUMMIES PO Take 2 tablets by mouth at bedtime.     finasteride (PROSCAR) 5 MG tablet Take 1 tablet (5 mg total) by mouth at bedtime. 90 tablet 3   furosemide (LASIX) 20 MG tablet TAKE 1 TABLET BY MOUTH DAILY AS NEEDED FOR SWELLING 90 tablet 1   glimepiride (AMARYL) 4 MG tablet TAKE 1 TABLET (4 MG TOTAL) BY MOUTH IN THE MORNING AND AT BEDTIME. (Patient taking differently: Take 2 mg by mouth daily with breakfast.) 180 tablet 0   glucose blood (ONETOUCH ULTRA) test strip 1 each by Other route 2 (two) times daily. Use as instructed Dx E11.9 100 strip 2   lisinopril (ZESTRIL) 2.5 MG tablet TAKE 1 TABLET BY MOUTH EVERY DAY 90 tablet 0   nitroGLYCERIN (NITROSTAT) 0.4 MG SL tablet Place 1 tablet (0.4 mg total) under the tongue every 5 (five) minutes x 3 doses as needed for chest pain. 25 tablet 3   Omega-3 Fatty Acids (FISH OIL) 1200 MG CAPS Take 1 capsule by mouth 2 (two) times daily.      oxybutynin (DITROPAN) 5 MG tablet Take 0.5 tablets (2.5 mg total) by mouth 3 (three) times daily. 135 tablet 3   senna-docusate (SENOKOT-S) 8.6-50 MG tablet Take 1 tablet by mouth 2 (two) times daily as needed for mild constipation. 180  tablet 0   tamsulosin (FLOMAX) 0.4 MG CAPS capsule Take 1 capsule (0.4 mg total) by mouth every morning. 90 capsule 3   TURMERIC PO Take 1,000 mg by mouth 2 (two) times daily.     vitamin B-12 (CYANOCOBALAMIN) 1000 MCG tablet Take 1,000 mcg by mouth every morning.      vitamin C (ASCORBIC ACID) 500 MG tablet Take 500 mg by mouth daily.     No current facility-administered medications for this visit.    Allergies:   Patient has no known allergies.   Social History: Social History   Socioeconomic History   Marital status: Married    Spouse name: johnna   Number of children: 3   Years of education: 12   Highest education level: Some college, no degree  Occupational History   Occupation: Health and safety inspector   retired   Occupation: Education officer, environmental   Tobacco Use   Smoking status: Former    Types: Cigarettes    Quit date: 1960    Years since quitting: 62.9   Smokeless tobacco: Never   Tobacco comments:    2-3 years as a teenager  Building services engineer Use: Never used  Substance and Sexual Activity   Alcohol use: No   Drug use: No   Sexual activity: Not Currently  Other Topics Concern   Not on file  Social History Narrative   Lives with with wife [name]   Married 60 years   Daughter [name] stays frequently   Passenger transport manager near by   Social Determinants of Corporate investment banker Strain: Not on file  Food Insecurity: Not on file  Transportation Needs: Not on file  Physical Activity: Not on file  Stress: Not on file  Social Connections: Not on file  Intimate Partner Violence: Not on file    Family History: Family History  Problem Relation Age of Onset   Hypertension Mother    Hyperlipidemia Mother    CAD Sister    Diabetes Sister    Hyperlipidemia Sister    Hypertension Sister    CAD Brother    Diabetes Brother    Hypertension Brother    Hyperlipidemia Brother    CAD Brother    Diabetes Brother    Hypertension Brother    Hyperlipidemia Brother    Dementia Brother    Thyroid disease Daughter    Hypertension Daughter    Diabetes Daughter    Hyperlipidemia Daughter    Fibromyalgia Daughter    Heart disease Daughter        stents   Multiple sclerosis Daughter    Thyroid disease Daughter     Review of Systems: All other systems reviewed and are otherwise negative except as noted above.   Physical Exam: Vitals:   01/07/21 0807  BP: (!) 110/58  Pulse: 66  SpO2: 93%  Weight: 161 lb 6.4 oz (73.2 kg)  Height: 5\' 9"  (1.753 m)     GEN- The patient is well appearing, alert and oriented x 3 today.   HEENT: normocephalic, atraumatic; sclera clear, conjunctiva pink; hearing intact; oropharynx clear; neck supple, no JVP Lymph- no cervical lymphadenopathy Lungs- Clear to ausculation bilaterally,  normal work of breathing.  No wheezes, rales, rhonchi Heart- Regular rate and rhythm, no murmurs, rubs or gallops, PMI not laterally displaced GI- soft, non-tender, non-distended, bowel sounds present, no hepatosplenomegaly Extremities- no clubbing or cyanosis. No edema; DP/PT/radial pulses 2+ bilaterally MS- no significant deformity or atrophy Skin- warm and dry, no rash  or lesion; ICD pocket well healed Psych- euthymic mood, full affect Neuro- strength and sensation are intact  ICD interrogation- reviewed in detail today,  See PACEART report  EKG:  EKG is not ordered today.  Recent Labs: 05/09/2020: ALT 18 08/20/2020: BUN 24; Creat 1.34; Potassium 4.4; Sodium 142   Wt Readings from Last 3 Encounters:  01/07/21 161 lb 6.4 oz (73.2 kg)  01/03/21 169 lb (76.7 kg)  09/12/20 158 lb 12.8 oz (72 kg)     Other studies Reviewed: Additional studies/ records that were reviewed today include: Previous EP office notes.   Assessment and Plan:  1.  Chronic systolic dysfunction s/p Medtronic CRT-D  euvolemic today Stable on an appropriate medical regimen Normal ICD function See Pace Art report No changes today  2. S/p VF arrest 11/18 No further episodes Given life threatening arrhythmia history, he needs very close follow-up with the EP team going forward   3. HL Continue Lipitor and zetia per gen cards  Current medicines are reviewed at length with the patient today.    Disposition:   Follow up with Dr. Ladona Ridgel in 6 months. He would like to be seen in [place] once Dr. Annamarie Dawley (Lives in [place])   Signed, Graciella Freer, [place]  01/07/2021 8:37 AM  [facility] [address] [place] [address] [phone] (office) [phone] (fax)

## 2021-01-07 ENCOUNTER — Ambulatory Visit (INDEPENDENT_AMBULATORY_CARE_PROVIDER_SITE_OTHER): Payer: Medicare Other | Admitting: Student

## 2021-01-07 ENCOUNTER — Encounter: Payer: Self-pay | Admitting: Student

## 2021-01-07 ENCOUNTER — Other Ambulatory Visit: Payer: Self-pay

## 2021-01-07 VITALS — BP 110/58 | HR 66 | Ht 69.0 in | Wt 161.4 lb

## 2021-01-07 DIAGNOSIS — I5022 Chronic systolic (congestive) heart failure: Secondary | ICD-10-CM

## 2021-01-07 DIAGNOSIS — I255 Ischemic cardiomyopathy: Secondary | ICD-10-CM

## 2021-01-07 DIAGNOSIS — I10 Essential (primary) hypertension: Secondary | ICD-10-CM | POA: Diagnosis not present

## 2021-01-07 DIAGNOSIS — I4901 Ventricular fibrillation: Secondary | ICD-10-CM

## 2021-01-07 LAB — CUP PACEART INCLINIC DEVICE CHECK
Battery Remaining Longevity: 35 mo
Battery Voltage: 2.95 V
Brady Statistic AP VP Percent: 59.85 %
Brady Statistic AP VS Percent: 0.03 %
Brady Statistic AS VP Percent: 39.78 %
Brady Statistic AS VS Percent: 0.34 %
Brady Statistic RA Percent Paced: 59.85 %
Brady Statistic RV Percent Paced: 93.93 %
Date Time Interrogation Session: 20221206090537
HighPow Impedance: 83 Ohm
Implantable Lead Implant Date: 20181130
Implantable Lead Implant Date: 20181130
Implantable Lead Implant Date: 20181130
Implantable Lead Location: 753858
Implantable Lead Location: 753859
Implantable Lead Location: 753860
Implantable Lead Model: 4598
Implantable Lead Model: 5076
Implantable Lead Model: 6935
Implantable Pulse Generator Implant Date: 20181130
Lead Channel Impedance Value: 141.867
Lead Channel Impedance Value: 145.871
Lead Channel Impedance Value: 152 Ohm
Lead Channel Impedance Value: 156.606
Lead Channel Impedance Value: 156.606
Lead Channel Impedance Value: 266 Ohm
Lead Channel Impedance Value: 304 Ohm
Lead Channel Impedance Value: 304 Ohm
Lead Channel Impedance Value: 323 Ohm
Lead Channel Impedance Value: 342 Ohm
Lead Channel Impedance Value: 380 Ohm
Lead Channel Impedance Value: 513 Ohm
Lead Channel Impedance Value: 513 Ohm
Lead Channel Impedance Value: 513 Ohm
Lead Channel Impedance Value: 513 Ohm
Lead Channel Impedance Value: 570 Ohm
Lead Channel Impedance Value: 570 Ohm
Lead Channel Impedance Value: 627 Ohm
Lead Channel Pacing Threshold Amplitude: 0.5 V
Lead Channel Pacing Threshold Amplitude: 0.75 V
Lead Channel Pacing Threshold Amplitude: 1.375 V
Lead Channel Pacing Threshold Pulse Width: 0.4 ms
Lead Channel Pacing Threshold Pulse Width: 0.4 ms
Lead Channel Pacing Threshold Pulse Width: 0.4 ms
Lead Channel Sensing Intrinsic Amplitude: 10 mV
Lead Channel Sensing Intrinsic Amplitude: 2.625 mV
Lead Channel Sensing Intrinsic Amplitude: 2.875 mV
Lead Channel Sensing Intrinsic Amplitude: 8.875 mV
Lead Channel Setting Pacing Amplitude: 2 V
Lead Channel Setting Pacing Amplitude: 2.5 V
Lead Channel Setting Pacing Amplitude: 2.5 V
Lead Channel Setting Pacing Pulse Width: 0.4 ms
Lead Channel Setting Pacing Pulse Width: 0.4 ms
Lead Channel Setting Sensing Sensitivity: 0.3 mV

## 2021-01-07 NOTE — Patient Instructions (Signed)
Medication Instructions:  Your physician recommends that you continue on your current medications as directed. Please refer to the Current Medication list given to you today.  *If you need a refill on your cardiac medications before your next appointment, please call your pharmacy*   Lab Work: None  If you have labs (blood work) drawn today and your tests are completely normal, you will receive your results only by: MyChart Message (if you have MyChart) OR A paper copy in the mail If you have any lab test that is abnormal or we need to change your treatment, we will call you to review the results.  Follow-Up: At [facility], you and your health needs are our priority.  As part of our continuing mission to provide you with exceptional heart care, we have created designated Provider Care Teams.  These Care Teams include your primary Cardiologist (physician) and Advanced Practice Providers (APPs -  Physician Assistants and Nurse Practitioners) who all work together to provide you with the care you need, when you need it.  Your next appointment:   6 month(s)  The format for your next appointment:   In Person  Provider:   Gregg Taylor, MD   

## 2021-01-08 ENCOUNTER — Ambulatory Visit (INDEPENDENT_AMBULATORY_CARE_PROVIDER_SITE_OTHER): Payer: Medicare Other

## 2021-01-08 DIAGNOSIS — N189 Chronic kidney disease, unspecified: Secondary | ICD-10-CM | POA: Diagnosis not present

## 2021-01-08 DIAGNOSIS — F4320 Adjustment disorder, unspecified: Secondary | ICD-10-CM | POA: Diagnosis not present

## 2021-01-08 DIAGNOSIS — R809 Proteinuria, unspecified: Secondary | ICD-10-CM | POA: Diagnosis not present

## 2021-01-08 DIAGNOSIS — E1122 Type 2 diabetes mellitus with diabetic chronic kidney disease: Secondary | ICD-10-CM | POA: Diagnosis not present

## 2021-01-08 DIAGNOSIS — E1129 Type 2 diabetes mellitus with other diabetic kidney complication: Secondary | ICD-10-CM | POA: Diagnosis not present

## 2021-01-08 DIAGNOSIS — I255 Ischemic cardiomyopathy: Secondary | ICD-10-CM

## 2021-01-08 DIAGNOSIS — E875 Hyperkalemia: Secondary | ICD-10-CM | POA: Diagnosis not present

## 2021-01-08 DIAGNOSIS — I9589 Other hypotension: Secondary | ICD-10-CM | POA: Diagnosis not present

## 2021-01-08 LAB — CUP PACEART REMOTE DEVICE CHECK
Battery Remaining Longevity: 35 mo
Battery Voltage: 2.95 V
Brady Statistic AP VP Percent: 20.37 %
Brady Statistic AP VS Percent: 0.03 %
Brady Statistic AS VP Percent: 78.57 %
Brady Statistic AS VS Percent: 1.03 %
Brady Statistic RA Percent Paced: 20.39 %
Brady Statistic RV Percent Paced: 94.08 %
Date Time Interrogation Session: 20221207012302
HighPow Impedance: 85 Ohm
Implantable Lead Implant Date: 20181130
Implantable Lead Implant Date: 20181130
Implantable Lead Implant Date: 20181130
Implantable Lead Location: 753858
Implantable Lead Location: 753859
Implantable Lead Location: 753860
Implantable Lead Model: 4598
Implantable Lead Model: 5076
Implantable Lead Model: 6935
Implantable Pulse Generator Implant Date: 20181130
Lead Channel Impedance Value: 152 Ohm
Lead Channel Impedance Value: 152 Ohm
Lead Channel Impedance Value: 152 Ohm
Lead Channel Impedance Value: 152 Ohm
Lead Channel Impedance Value: 152 Ohm
Lead Channel Impedance Value: 304 Ohm
Lead Channel Impedance Value: 304 Ohm
Lead Channel Impedance Value: 304 Ohm
Lead Channel Impedance Value: 304 Ohm
Lead Channel Impedance Value: 342 Ohm
Lead Channel Impedance Value: 380 Ohm
Lead Channel Impedance Value: 437 Ohm
Lead Channel Impedance Value: 494 Ohm
Lead Channel Impedance Value: 494 Ohm
Lead Channel Impedance Value: 494 Ohm
Lead Channel Impedance Value: 532 Ohm
Lead Channel Impedance Value: 532 Ohm
Lead Channel Impedance Value: 627 Ohm
Lead Channel Pacing Threshold Amplitude: 0.375 V
Lead Channel Pacing Threshold Amplitude: 0.75 V
Lead Channel Pacing Threshold Amplitude: 1.375 V
Lead Channel Pacing Threshold Pulse Width: 0.4 ms
Lead Channel Pacing Threshold Pulse Width: 0.4 ms
Lead Channel Pacing Threshold Pulse Width: 0.4 ms
Lead Channel Sensing Intrinsic Amplitude: 2.625 mV
Lead Channel Sensing Intrinsic Amplitude: 2.875 mV
Lead Channel Sensing Intrinsic Amplitude: 9 mV
Lead Channel Sensing Intrinsic Amplitude: 9 mV
Lead Channel Setting Pacing Amplitude: 2 V
Lead Channel Setting Pacing Amplitude: 2.5 V
Lead Channel Setting Pacing Amplitude: 2.5 V
Lead Channel Setting Pacing Pulse Width: 0.4 ms
Lead Channel Setting Pacing Pulse Width: 0.4 ms
Lead Channel Setting Sensing Sensitivity: 0.3 mV

## 2021-01-09 ENCOUNTER — Ambulatory Visit (INDEPENDENT_AMBULATORY_CARE_PROVIDER_SITE_OTHER): Payer: Medicare Other | Admitting: Internal Medicine

## 2021-01-14 ENCOUNTER — Ambulatory Visit (INDEPENDENT_AMBULATORY_CARE_PROVIDER_SITE_OTHER): Payer: Medicare Other | Admitting: Family Medicine

## 2021-01-14 ENCOUNTER — Encounter: Payer: Self-pay | Admitting: Family Medicine

## 2021-01-14 ENCOUNTER — Other Ambulatory Visit: Payer: Self-pay | Admitting: Family Medicine

## 2021-01-14 VITALS — BP 110/64 | HR 62 | Temp 97.5°F | Ht 69.0 in | Wt 160.2 lb

## 2021-01-14 DIAGNOSIS — I5022 Chronic systolic (congestive) heart failure: Secondary | ICD-10-CM

## 2021-01-14 DIAGNOSIS — N1832 Chronic kidney disease, stage 3b: Secondary | ICD-10-CM | POA: Diagnosis not present

## 2021-01-14 DIAGNOSIS — K219 Gastro-esophageal reflux disease without esophagitis: Secondary | ICD-10-CM

## 2021-01-14 DIAGNOSIS — E1122 Type 2 diabetes mellitus with diabetic chronic kidney disease: Secondary | ICD-10-CM | POA: Diagnosis not present

## 2021-01-14 LAB — BAYER DCA HB A1C WAIVED: HB A1C (BAYER DCA - WAIVED): 6.7 % — ABNORMAL HIGH (ref 4.8–5.6)

## 2021-01-14 MED ORDER — PANTOPRAZOLE SODIUM 40 MG PO TBEC
40.0000 mg | DELAYED_RELEASE_TABLET | Freq: Every day | ORAL | 1 refills | Status: DC
Start: 2021-01-14 — End: 2021-06-11

## 2021-01-14 MED ORDER — DAPAGLIFLOZIN PROPANEDIOL 10 MG PO TABS: ORAL_TABLET | ORAL | 2 refills | Status: DC

## 2021-01-14 NOTE — Patient Instructions (Signed)
Stop glimepiride once you get Omar Williams from the pharmacy.

## 2021-01-14 NOTE — Progress Notes (Signed)
Assessment & Plan:  1. Type 2 diabetes mellitus with stage 3b chronic kidney disease, without long-term current use of insulin (HCC) Lab Results  Component Value Date   HGBA1C 6.7 (H) 01/14/2021   HGBA1C 7.0 (H) 08/20/2020   HGBA1C 7.4 (H) 05/09/2020    - Diabetes is at goal of A1c < 7. - Medications:  rx'd Wilder Glade to control diabetes and CKD. Advised Omar Williams may stop glimepiride when Omar Williams is able to start [place] glucose monitoring: continue monitoring. - Patient is currently taking a statin. Patient is taking an ACE-inhibitor/ARB.  - Instruction/counseling given: reminded to bring blood glucose meter & log to each visit  Diabetes Health Maintenance Due  Topic Date Due   OPHTHALMOLOGY EXAM  09/17/2019   HEMOGLOBIN A1C  07/15/2021   FOOT EXAM  09/12/2021    Lab Results  Component Value Date   LABMICR 134.1 09/12/2020   LABMICR See below: 08/15/2020   MICROALBUR 14.6 08/08/2019   MICROALBUR 17.8 04/17/2019   - Lipid panel - Bayer DCA Hb A1c Waived - Hepatic function panel  2. Stage 3b chronic kidney disease ([place]) Started [place] today. Continue following with  nephrology.  3. Chronic systolic heart failure ([place]) Managed by cardiology. Well controlled on current regimen.   4. Gastroesophageal reflux disease, unspecified whether esophagitis present Well controlled on current regimen.  - pantoprazole (PROTONIX) 40 MG tablet; Take 1 tablet (40 mg total) by mouth daily.  Dispense: 90 tablet; Refill: 1   Return in about 3 months (around 04/14/2021) for follow-up of chronic medication conditions.  Hendricks Limes, MSN, APRN, FNP-C [facility]  Subjective:    Patient ID: Omar Williams, male    DOB: 18-Apr-1935, 85 y.o.   MRN: [id number]  Patient Care Team: Loman Brooklyn, FNP as PCP - General (Family Medicine) Thompson Grayer, MD as PCP - Electrophysiology (Cardiology) Shirl Harris, OD (Optometry) Verta Ellen., NP as Nurse Practitioner  (Cardiology) Liana Gerold, MD as Consulting Physician (Nephrology) Irine Seal, MD as Attending Physician (Urology) Aviva Signs, MD as Consulting Physician (General Surgery)   Chief Complaint:  Chief Complaint  Patient presents with   Diabetes   Hypertension    3 month follow up of chronic medical conditions     HPI: Omar Williams is a 85 y.o. male presenting on 01/14/2021 for Diabetes and Hypertension (3 month follow up of chronic medical conditions )  Diabetes: Patient presents for follow up of diabetes. Current symptoms include: hyperglycemia. Known diabetic complications: nephropathy and cardiovascular disease. Medication compliance: taking 1/2 tablet in the morning if his blood sugar is > 150 (which is not very often) and 1/2 tablet every night. Current diet: in general, a "healthy" diet  . Current exercise: walking and weightlifting. Home blood sugar records: BGs are running  consistent with Hgb A1C. Is Omar Williams  on ACE inhibitor or angiotensin II receptor blocker? Yes (Lisinopril). Is Omar Williams on a statin? Yes (Atorvastatin).   CKD: Managed by Dr. Theador Hawthorne, nephrologist, who Omar Williams last saw on 01/08/2021.  No changes at that time.  Patient to follow-up in 3 months.  CHF/history of V-Fib/hypertension/CAD/hyperlipidemia: Patient saw his electrophysiologist on 01/07/2021 and his general cardiologist on 01/03/2021.  New complaints: None   Social history:  Relevant past medical, surgical, family and social history reviewed and updated as indicated. Interim medical history since our last visit reviewed.  Allergies and medications reviewed and updated.  DATA REVIEWED: CHART IN EPIC  ROS: Negative unless specifically indicated above  in HPI.    Current Outpatient Medications:    aspirin EC 81 MG tablet, Take 81 mg by mouth daily., Disp: , Rfl:    atorvastatin (LIPITOR) 80 MG tablet, TAKE 1 TABLET BY MOUTH EVERYDAY AT BEDTIME, Disp: 90 tablet, Rfl: 1   Bee Pollen 550 MG CAPS, Take 2 capsules  by mouth 2 (two) times daily., Disp: , Rfl:    chlorhexidine (PERIDEX) 0.12 % solution, as needed., Disp: , Rfl:    Cholecalciferol (VITAMIN D3) 125 MCG (5000 UT) TABS, Take 1 tablet by mouth daily. , Disp: , Rfl:    CINNAMON PO, Take 1,000 mg by mouth in the morning and at bedtime., Disp: , Rfl:    ezetimibe (ZETIA) 10 MG tablet, TAKE 1 TABLET BY MOUTH EVERY DAY, Disp: 90 tablet, Rfl: 1   FIBER ADULT GUMMIES PO, Take 2 tablets by mouth at bedtime., Disp: , Rfl:    finasteride (PROSCAR) 5 MG tablet, Take 1 tablet (5 mg total) by mouth at bedtime., Disp: 90 tablet, Rfl: 3   furosemide (LASIX) 20 MG tablet, TAKE 1 TABLET BY MOUTH DAILY AS NEEDED FOR SWELLING, Disp: 90 tablet, Rfl: 1   glimepiride (AMARYL) 4 MG tablet, TAKE 1 TABLET (4 MG TOTAL) BY MOUTH IN THE MORNING AND AT BEDTIME. (Patient taking differently: Take 2 mg by mouth daily with breakfast.), Disp: 180 tablet, Rfl: 0   glucose blood (ONETOUCH ULTRA) test strip, 1 each by Other route 2 (two) times daily. Use as instructed Dx E11.9, Disp: 100 strip, Rfl: 2   lisinopril (ZESTRIL) 2.5 MG tablet, TAKE 1 TABLET BY MOUTH EVERY DAY, Disp: 90 tablet, Rfl: 0   nitroGLYCERIN (NITROSTAT) 0.4 MG SL tablet, Place 1 tablet (0.4 mg total) under the tongue every 5 (five) minutes x 3 doses as needed for chest pain., Disp: 25 tablet, Rfl: 3   Omega-3 Fatty Acids (FISH OIL) 1200 MG CAPS, Take 1 capsule by mouth 2 (two) times daily. , Disp: , Rfl:    oxybutynin (DITROPAN) 5 MG tablet, Take 0.5 tablets (2.5 mg total) by mouth 3 (three) times daily., Disp: 135 tablet, Rfl: 3   pantoprazole (PROTONIX) 40 MG tablet, Take 40 mg by mouth daily., Disp: , Rfl:    senna-docusate (SENOKOT-S) 8.6-50 MG tablet, Take 1 tablet by mouth 2 (two) times daily as needed for mild constipation., Disp: 180 tablet, Rfl: 0   tamsulosin (FLOMAX) 0.4 MG CAPS capsule, Take 1 capsule (0.4 mg total) by mouth every morning., Disp: 90 capsule, Rfl: 3   TURMERIC PO, Take 1,000 mg by mouth 2  (two) times daily., Disp: , Rfl:    vitamin B-12 (CYANOCOBALAMIN) 1000 MCG tablet, Take 1,000 mcg by mouth every morning. , Disp: , Rfl:    vitamin C (ASCORBIC ACID) 500 MG tablet, Take 500 mg by mouth daily., Disp: , Rfl:    No Known Allergies Past Medical History:  Diagnosis Date   Acid reflux    Blood transfusion without reported diagnosis    Cardiac arrest ([place])    a. occuring in 12/2016. LAD disease but no targets for CABG or PCI. Underwent ICD placement as EF 15-20%.    CHF (congestive heart failure) (HCC)    Coronary artery disease    Diabetes mellitus without complication ([place])    Diabetic nephropathy ([place])    Hyperlipidemia    Hypertension    Myocardial infarction [facility])    Renal disorder     Past Surgical History:  Procedure Laterality Date   BIV  ICD INSERTION CRT-D N/A 01/01/2017   Procedure: BIV ICD INSERTION CRT-D;  Surgeon: Constance Haw, MD;  Location: [place] [facility];  Service: Cardiovascular;  Laterality: N/A;    Social History   Socioeconomic History   Marital status: Married    Spouse name: johnna   Number of children: 3   Years of education: 12   Highest education level: Some college, no degree  Occupational History   Occupation: Radiographer, therapeutic   retired   Occupation: Theme park manager  Tobacco Use   Smoking status: Former    Types: Cigarettes    Quit date: 1960    Years since quitting: 62.9   Smokeless tobacco: Never   Tobacco comments:    2-3 years as a teenager  Scientific laboratory technician Use: Never used  Substance and Sexual Activity   Alcohol use: No   Drug use: No   Sexual activity: Not Currently  Other Topics Concern   Not on file  Social History Narrative   Lives with with wife [name]   Married 30 years   Daughter [name] stays frequently   Surveyor, minerals near by   Social Determinants of Radio broadcast assistant Strain: Not on file  Food Insecurity: Not on file  Transportation Needs: Not on file  Physical Activity: Not on  file  Stress: Not on file  Social Connections: Not on file  Intimate Partner Violence: Not on file        Objective:    BP 110/64    Pulse 62    Temp (!) 97.5 F (36.4 C) (Temporal)    Ht 5\' 9"  (1.753 m)    Wt 160 lb 3.2 oz (72.7 kg)    SpO2 93%    BMI 23.66 kg/m   Wt Readings from Last 3 Encounters:  01/14/21 160 lb 3.2 oz (72.7 kg)  01/07/21 161 lb 6.4 oz (73.2 kg)  01/03/21 169 lb (76.7 kg)    Physical Exam Vitals reviewed.  Constitutional:      General: Omar Williams is not in acute distress.    Appearance: Normal appearance. Omar Williams is normal weight. Omar Williams is not ill-appearing, toxic-appearing or diaphoretic.  HENT:     Head: Normocephalic and atraumatic.  Eyes:     General: No scleral icterus.       Right eye: No discharge.        Left eye: No discharge.     Conjunctiva/sclera: Conjunctivae normal.  Cardiovascular:     Rate and Rhythm: Normal rate and regular rhythm.     Heart sounds: Normal heart sounds. No murmur heard.   No friction rub. No gallop.  Pulmonary:     Effort: Pulmonary effort is normal. No respiratory distress.     Breath sounds: Normal breath sounds. No stridor. No wheezing, rhonchi or rales.  Musculoskeletal:        General: Normal range of motion.     Cervical back: Normal range of motion.  Skin:    General: Skin is warm and dry.  Neurological:     Mental Status: Omar Williams is alert and oriented to person, place, and time. Mental status is at baseline.  Psychiatric:        Mood and Affect: Mood normal.        Behavior: Behavior normal.        Thought Content: Thought content normal.        Judgment: Judgment normal.    No results found for: TSH Lab Results  Component  Value Date   WBC 7.6 02/09/2017   HGB 13.2 02/09/2017   HCT 39.0 02/09/2017   MCV 92.4 02/09/2017   PLT 320 02/09/2017   Lab Results  Component Value Date   NA 142 08/20/2020   K 4.4 08/20/2020   CO2 28 08/20/2020   GLUCOSE 110 (H) 08/20/2020   BUN 24 08/20/2020   CREATININE 1.34 (H)  08/20/2020   BILITOT 0.9 05/09/2020   ALKPHOS 137 (H) 01/25/2017   AST 23 05/09/2020   ALT 18 05/09/2020   PROT 7.2 05/09/2020   ALBUMIN 2.9 (L) 01/25/2017   CALCIUM 9.5 08/20/2020   ANIONGAP 12 01/25/2017   Lab Results  Component Value Date   CHOL 134 08/08/2019   Lab Results  Component Value Date   HDL 43 08/08/2019   Lab Results  Component Value Date   LDLCALC 69 08/08/2019   Lab Results  Component Value Date   TRIG 132 08/08/2019   Lab Results  Component Value Date   CHOLHDL 3.1 08/08/2019   Lab Results  Component Value Date   HGBA1C 7.0 (H) 08/20/2020

## 2021-01-15 LAB — LIPID PANEL
Chol/HDL Ratio: 2.8 ratio (ref 0.0–5.0)
Cholesterol, Total: 153 mg/dL (ref 100–199)
HDL: 54 mg/dL (ref >39)
LDL Chol Calc (NIH): 74 mg/dL (ref 0–99)
Triglycerides: 148 mg/dL (ref 0–149)
VLDL Cholesterol Cal: 25 mg/dL (ref 5–40)

## 2021-01-15 LAB — HEPATIC FUNCTION PANEL
ALT: 22 IU/L (ref 0–44)
AST: 20 IU/L (ref 0–40)
Albumin: 4.6 g/dL (ref 3.6–4.6)
Alkaline Phosphatase: 91 IU/L (ref 44–121)
Bilirubin Total: 0.5 mg/dL (ref 0.0–1.2)
Bilirubin, Direct: 0.17 mg/dL (ref 0.00–0.40)
Total Protein: 7.1 g/dL (ref 6.0–8.5)

## 2021-01-16 NOTE — Progress Notes (Signed)
Remote ICD transmission.   

## 2021-01-20 ENCOUNTER — Ambulatory Visit (INDEPENDENT_AMBULATORY_CARE_PROVIDER_SITE_OTHER): Payer: Medicare Other

## 2021-01-20 ENCOUNTER — Encounter: Payer: Self-pay | Admitting: Family Medicine

## 2021-01-20 ENCOUNTER — Ambulatory Visit: Payer: Medicare Other | Admitting: Family Medicine

## 2021-01-20 DIAGNOSIS — I5022 Chronic systolic (congestive) heart failure: Secondary | ICD-10-CM | POA: Diagnosis not present

## 2021-01-20 DIAGNOSIS — Z95810 Presence of automatic (implantable) cardiac defibrillator: Secondary | ICD-10-CM

## 2021-01-21 NOTE — Progress Notes (Signed)
EPIC Encounter for ICM Monitoring  Patient Name: Omar Williams is a 85 y.o. male Date: 01/21/2021 Primary Care Physican: Gwenlyn Fudge, FNP Primary Cardiologist:  Wyline Mood Electrophysiologist: Allred Nephrologist: [facility] Bi-V Pacing: 99.1% 11/12/2020 Weight: 157 lbs 01/21/2021 Weight: 160 lbs   Spoke with patient and heart failure questions reviewed.  Pt asymptomatic for fluid accumulation and feeling well.     Optivol Thoracic impedance suggesting normal fluid levels.   Prescribed:  Furosemide 20 mg take 1 tablet as needed for swelling.  Pt reports 8/8 taking Furosemide daily instead of PRN.   Labs: 08/20/2020 Creatinine 1.34, BUN 18, Potassium 4.4, Sodium 142 05/17/2020 Creatinine 1.17, BUN 14, Potassium 4.4, Sodium 143 05/09/2020 Creatinine 1.01, BUN 17, Potassium 4.0, Sodium 138, GFR 69-80 04/10/2020 Creatinine 1.07, BUN 16, Potassium 4.3, Sodium 139, GFR 64-75 03/30/2020 Creatinine 1.02, BUN 24, Potassium 4.2, Sodium 133, GFR 68-79 03/29/2020 Creatinine 1.24, BUN 28, Potassium 4.4, Sodium 135, GFR 54-62 Care Everywhere  A complete set of results can be found in Results Review.   Recommendations:  No changes and encouraged to call if experiencing any fluid symptoms.   Follow-up plan: ICM clinic phone appointment on 02/27/2020.   91 day device clinic remote transmission 04/10/2023.       EP/Cardiology Office Visits: 05/14/2021 with Dr Wyline Mood.  Recall 07/06/2021 with Dr Ladona Ridgel      3 month ICM trend: 01/21/2021.    12-14 Month ICM trend:       Karie Soda, RN 01/21/2021 11:53 AM

## 2021-02-09 DIAGNOSIS — Z20822 Contact with and (suspected) exposure to covid-19: Secondary | ICD-10-CM | POA: Diagnosis not present

## 2021-02-10 ENCOUNTER — Ambulatory Visit (INDEPENDENT_AMBULATORY_CARE_PROVIDER_SITE_OTHER): Payer: Medicare Other

## 2021-02-10 DIAGNOSIS — I5022 Chronic systolic (congestive) heart failure: Secondary | ICD-10-CM

## 2021-02-10 LAB — ECHOCARDIOGRAM COMPLETE
AR max vel: 1.75 cm2
AV Area VTI: 2.2 cm2
AV Area mean vel: 1.72 cm2
AV Mean grad: 3.8 mmHg
AV Peak grad: 6.9 mmHg
Ao pk vel: 1.31 m/s
Area-P 1/2: 2.42 cm2
Calc EF: 51 %
P 1/2 time: 591 msec
S' Lateral: 4.27 cm
Single Plane A2C EF: 50.9 %
Single Plane A4C EF: 53.8 %

## 2021-02-12 ENCOUNTER — Telehealth: Payer: Self-pay

## 2021-02-12 NOTE — Telephone Encounter (Signed)
Patient notified and verbalized understanding of results. Pt had no questions or concerns at this time.  

## 2021-02-12 NOTE — Telephone Encounter (Signed)
-----   Message from Antoine Poche, MD sent at 02/12/2021  4:27 PM EST ----- Echo shows heart function has improved and now is in the low normal range   Dominga Ferry MD

## 2021-02-26 ENCOUNTER — Ambulatory Visit (INDEPENDENT_AMBULATORY_CARE_PROVIDER_SITE_OTHER): Payer: Medicare Other

## 2021-02-26 DIAGNOSIS — Z95810 Presence of automatic (implantable) cardiac defibrillator: Secondary | ICD-10-CM | POA: Diagnosis not present

## 2021-02-26 DIAGNOSIS — I5022 Chronic systolic (congestive) heart failure: Secondary | ICD-10-CM | POA: Diagnosis not present

## 2021-02-26 NOTE — Progress Notes (Signed)
EPIC Encounter for ICM Monitoring  Patient Name: Omar Williams is a 86 y.o. male Date: 02/26/2021 Primary Care Physican: Gwenlyn Fudge, FNP Primary Cardiologist:  Wyline Mood Electrophysiologist: Allred Nephrologist: [facility] Bi-V Pacing: 98.8% 02/26/2021 Weight: 160 lbs   Spoke with patient and heart failure questions reviewed.  Pt asymptomatic for fluid accumulation and feeling well.     Optivol Thoracic impedance suggesting normal fluid levels.   Prescribed:  Furosemide 20 mg take 1 tablet as needed for swelling.  Pt reports 8/8 taking Furosemide daily instead of PRN.   Labs: 08/20/2020 Creatinine 1.34, BUN 18, Potassium 4.4, Sodium 142 05/17/2020 Creatinine 1.17, BUN 14, Potassium 4.4, Sodium 143 05/09/2020 Creatinine 1.01, BUN 17, Potassium 4.0, Sodium 138, GFR 69-80 04/10/2020 Creatinine 1.07, BUN 16, Potassium 4.3, Sodium 139, GFR 64-75 03/30/2020 Creatinine 1.02, BUN 24, Potassium 4.2, Sodium 133, GFR 68-79 03/29/2020 Creatinine 1.24, BUN 28, Potassium 4.4, Sodium 135, GFR 54-62 Care Everywhere  A complete set of results can be found in Results Review.   Recommendations:  No changes and encouraged to call if experiencing any fluid symptoms.   Follow-up plan: ICM clinic phone appointment on 04/02/2020.   91 day device clinic remote transmission 04/10/2023.       EP/Cardiology Office Visits: 05/14/2021 with Dr Wyline Mood.  Recall 07/06/2021 with Dr Ladona Ridgel      Copy of ICM check sent to Dr. Johney Frame.   3 month ICM trend: 02/26/2021.    12-14 Month ICM trend:     Karie Soda, RN 02/26/2021 10:31 AM

## 2021-03-17 ENCOUNTER — Other Ambulatory Visit: Payer: Self-pay | Admitting: Family Medicine

## 2021-03-17 DIAGNOSIS — E1121 Type 2 diabetes mellitus with diabetic nephropathy: Secondary | ICD-10-CM

## 2021-03-24 ENCOUNTER — Other Ambulatory Visit: Payer: Self-pay | Admitting: Family Medicine

## 2021-03-24 DIAGNOSIS — E1121 Type 2 diabetes mellitus with diabetic nephropathy: Secondary | ICD-10-CM

## 2021-04-02 ENCOUNTER — Ambulatory Visit (INDEPENDENT_AMBULATORY_CARE_PROVIDER_SITE_OTHER): Payer: Medicare Other

## 2021-04-02 DIAGNOSIS — Z95810 Presence of automatic (implantable) cardiac defibrillator: Secondary | ICD-10-CM | POA: Diagnosis not present

## 2021-04-02 DIAGNOSIS — I5022 Chronic systolic (congestive) heart failure: Secondary | ICD-10-CM | POA: Diagnosis not present

## 2021-04-04 NOTE — Progress Notes (Signed)
EPIC Encounter for ICM Monitoring ? ?Patient Name: Omar Williams is a 86 y.o. male ?Date: 04/04/2021 ?Primary Care Physican: Gwenlyn Fudge, FNP ?Primary Cardiologist:  Branch ?Electrophysiologist: Allred ?Nephrologist: [facility] ?Bi-V Pacing: 99.2% ?04/04/2021 Weight: 160 lbs ?  ?Spoke with patient and heart failure questions reviewed.  Pt asymptomatic for fluid accumulation and feeling well.   ?  ?Optivol Thoracic impedance suggesting normal fluid levels. ?  ?Prescribed:  ?Furosemide 20 mg take 1 tablet as needed for swelling.  Pt reports taking Furosemide daily instead of PRN. ?  ?Labs: ?08/20/2020 Creatinine 1.34, BUN 18, Potassium 4.4, Sodium 142 ?05/17/2020 Creatinine 1.17, BUN 14, Potassium 4.4, Sodium 143 ?05/09/2020 Creatinine 1.01, BUN 17, Potassium 4.0, Sodium 138, GFR 69-80 ?04/10/2020 Creatinine 1.07, BUN 16, Potassium 4.3, Sodium 139, GFR 64-75 ?03/30/2020 Creatinine 1.02, BUN 24, Potassium 4.2, Sodium 133, GFR 68-79 ?03/29/2020 Creatinine 1.24, BUN 28, Potassium 4.4, Sodium 135, GFR 54-62 Care Everywhere  ?A complete set of results can be found in Results Review. ?  ?Recommendations:  No changes and encouraged to call if experiencing any fluid symptoms. ?  ?Follow-up plan: ICM clinic phone appointment on 05/05/2020.   91 day device clinic remote transmission 04/10/2023.     ?  ?EP/Cardiology Office Visits: 05/14/2021 with Dr Wyline Mood.  Recall 07/06/2021 with Dr Ladona Ridgel     ?  ?Copy of ICM check sent to Dr. Johney Frame.   ? ?3 month ICM trend: 04/02/2021. ? ? ? ?12-14 Month ICM trend:  ? ? ? ?Karie Soda, RN ?04/04/2021 ?10:30 AM ? ?

## 2021-04-09 ENCOUNTER — Ambulatory Visit (INDEPENDENT_AMBULATORY_CARE_PROVIDER_SITE_OTHER): Payer: Medicare Other

## 2021-04-09 DIAGNOSIS — E559 Vitamin D deficiency, unspecified: Secondary | ICD-10-CM | POA: Diagnosis not present

## 2021-04-09 DIAGNOSIS — I255 Ischemic cardiomyopathy: Secondary | ICD-10-CM | POA: Diagnosis not present

## 2021-04-09 DIAGNOSIS — F4320 Adjustment disorder, unspecified: Secondary | ICD-10-CM | POA: Diagnosis not present

## 2021-04-09 DIAGNOSIS — R809 Proteinuria, unspecified: Secondary | ICD-10-CM | POA: Diagnosis not present

## 2021-04-09 DIAGNOSIS — E1129 Type 2 diabetes mellitus with other diabetic kidney complication: Secondary | ICD-10-CM | POA: Diagnosis not present

## 2021-04-09 DIAGNOSIS — E875 Hyperkalemia: Secondary | ICD-10-CM | POA: Diagnosis not present

## 2021-04-09 DIAGNOSIS — Z79899 Other long term (current) drug therapy: Secondary | ICD-10-CM | POA: Diagnosis not present

## 2021-04-09 DIAGNOSIS — E1122 Type 2 diabetes mellitus with diabetic chronic kidney disease: Secondary | ICD-10-CM | POA: Diagnosis not present

## 2021-04-09 DIAGNOSIS — N189 Chronic kidney disease, unspecified: Secondary | ICD-10-CM | POA: Diagnosis not present

## 2021-04-09 DIAGNOSIS — I9589 Other hypotension: Secondary | ICD-10-CM | POA: Diagnosis not present

## 2021-04-09 LAB — CUP PACEART REMOTE DEVICE CHECK
Battery Remaining Longevity: 30 mo
Battery Voltage: 2.95 V
Brady Statistic AP VP Percent: 45.62 %
Brady Statistic AP VS Percent: 0.03 %
Brady Statistic AS VP Percent: 53.56 %
Brady Statistic AS VS Percent: 0.79 %
Brady Statistic RA Percent Paced: 45.62 %
Brady Statistic RV Percent Paced: 91.26 %
Date Time Interrogation Session: 20230308022703
HighPow Impedance: 87 Ohm
Implantable Lead Implant Date: 20181130
Implantable Lead Implant Date: 20181130
Implantable Lead Implant Date: 20181130
Implantable Lead Location: 753858
Implantable Lead Location: 753859
Implantable Lead Location: 753860
Implantable Lead Model: 4598
Implantable Lead Model: 5076
Implantable Lead Model: 6935
Implantable Pulse Generator Implant Date: 20181130
Lead Channel Impedance Value: 145.871
Lead Channel Impedance Value: 149.625
Lead Channel Impedance Value: 166.114
Lead Channel Impedance Value: 166.114
Lead Channel Impedance Value: 171 Ohm
Lead Channel Impedance Value: 266 Ohm
Lead Channel Impedance Value: 323 Ohm
Lead Channel Impedance Value: 342 Ohm
Lead Channel Impedance Value: 342 Ohm
Lead Channel Impedance Value: 342 Ohm
Lead Channel Impedance Value: 380 Ohm
Lead Channel Impedance Value: 494 Ohm
Lead Channel Impedance Value: 494 Ohm
Lead Channel Impedance Value: 532 Ohm
Lead Channel Impedance Value: 532 Ohm
Lead Channel Impedance Value: 532 Ohm
Lead Channel Impedance Value: 589 Ohm
Lead Channel Impedance Value: 646 Ohm
Lead Channel Pacing Threshold Amplitude: 0.375 V
Lead Channel Pacing Threshold Amplitude: 0.75 V
Lead Channel Pacing Threshold Amplitude: 1.5 V
Lead Channel Pacing Threshold Pulse Width: 0.4 ms
Lead Channel Pacing Threshold Pulse Width: 0.4 ms
Lead Channel Pacing Threshold Pulse Width: 0.4 ms
Lead Channel Sensing Intrinsic Amplitude: 2.875 mV
Lead Channel Sensing Intrinsic Amplitude: 2.875 mV
Lead Channel Sensing Intrinsic Amplitude: 8.875 mV
Lead Channel Sensing Intrinsic Amplitude: 8.875 mV
Lead Channel Setting Pacing Amplitude: 2 V
Lead Channel Setting Pacing Amplitude: 2 V
Lead Channel Setting Pacing Amplitude: 2.5 V
Lead Channel Setting Pacing Pulse Width: 0.4 ms
Lead Channel Setting Pacing Pulse Width: 0.4 ms
Lead Channel Setting Sensing Sensitivity: 0.3 mV

## 2021-04-11 ENCOUNTER — Other Ambulatory Visit: Payer: Self-pay | Admitting: Family Medicine

## 2021-04-11 DIAGNOSIS — N1832 Chronic kidney disease, stage 3b: Secondary | ICD-10-CM

## 2021-04-11 DIAGNOSIS — I5022 Chronic systolic (congestive) heart failure: Secondary | ICD-10-CM

## 2021-04-15 ENCOUNTER — Ambulatory Visit (INDEPENDENT_AMBULATORY_CARE_PROVIDER_SITE_OTHER): Payer: Medicare Other | Admitting: Family Medicine

## 2021-04-15 ENCOUNTER — Encounter: Payer: Self-pay | Admitting: Family Medicine

## 2021-04-15 VITALS — BP 108/62 | HR 60 | Temp 96.8°F | Ht 69.0 in | Wt 160.4 lb

## 2021-04-15 DIAGNOSIS — Z23 Encounter for immunization: Secondary | ICD-10-CM | POA: Diagnosis not present

## 2021-04-15 DIAGNOSIS — I10 Essential (primary) hypertension: Secondary | ICD-10-CM | POA: Diagnosis not present

## 2021-04-15 DIAGNOSIS — I5022 Chronic systolic (congestive) heart failure: Secondary | ICD-10-CM

## 2021-04-15 DIAGNOSIS — E1122 Type 2 diabetes mellitus with diabetic chronic kidney disease: Secondary | ICD-10-CM | POA: Diagnosis not present

## 2021-04-15 DIAGNOSIS — Z95810 Presence of automatic (implantable) cardiac defibrillator: Secondary | ICD-10-CM | POA: Diagnosis not present

## 2021-04-15 DIAGNOSIS — R682 Dry mouth, unspecified: Secondary | ICD-10-CM | POA: Diagnosis not present

## 2021-04-15 DIAGNOSIS — E785 Hyperlipidemia, unspecified: Secondary | ICD-10-CM | POA: Diagnosis not present

## 2021-04-15 DIAGNOSIS — E1121 Type 2 diabetes mellitus with diabetic nephropathy: Secondary | ICD-10-CM | POA: Diagnosis not present

## 2021-04-15 DIAGNOSIS — I252 Old myocardial infarction: Secondary | ICD-10-CM | POA: Diagnosis not present

## 2021-04-15 DIAGNOSIS — N1832 Chronic kidney disease, stage 3b: Secondary | ICD-10-CM | POA: Diagnosis not present

## 2021-04-15 DIAGNOSIS — E559 Vitamin D deficiency, unspecified: Secondary | ICD-10-CM

## 2021-04-15 DIAGNOSIS — I25118 Atherosclerotic heart disease of native coronary artery with other forms of angina pectoris: Secondary | ICD-10-CM

## 2021-04-15 LAB — BAYER DCA HB A1C WAIVED: HB A1C (BAYER DCA - WAIVED): 7.6 % — ABNORMAL HIGH (ref 4.8–5.6)

## 2021-04-15 MED ORDER — EMPAGLIFLOZIN 25 MG PO TABS: 25.0000 mg | ORAL_TABLET | Freq: Every day | ORAL | 1 refills | Status: DC

## 2021-04-15 NOTE — Patient Instructions (Signed)
Please have your eye doctor fax [place] results of your eye exam now and every time you see them.  ?

## 2021-04-15 NOTE — Progress Notes (Signed)
? ?Assessment & Plan:  ?1. Type 2 diabetes mellitus with stage 3b chronic kidney disease, without long-term current use of insulin ([place]) ?Lab Results  ?Component Value Date  ? HGBA1C 7.6 (H) 04/15/2021  ? HGBA1C 6.7 (H) 01/14/2021  ? HGBA1C 7.0 (H) 08/20/2020  ?  ?- Diabetes is at goal of A1c < 8, however he does not feel he is controlled. ?- Medications:  increase Jardiance from 10 mg to 25 mg daily. ?- Home glucose monitoring: continue monitoring. ?- Patient is currently taking a statin. Patient is taking an ACE-inhibitor/ARB.  ?- Instruction/counseling given: reminded to get eye exam ? ?Diabetes Health Maintenance Due  ?Topic Date Due  ? OPHTHALMOLOGY EXAM  09/17/2019  ? HEMOGLOBIN A1C  07/15/2021  ? FOOT EXAM  09/12/2021  ?  ?Lab Results  ?Component Value Date  ? LABMICR 134.1 09/12/2020  ? LABMICR See below: 08/15/2020  ? MICROALBUR 14.6 08/08/2019  ? MICROALBUR 17.8 04/17/2019  ? ?- Lipid panel ?- CBC with Differential/Platelet ?- CMP14+EGFR ?- Bayer DCA Hb A1c Waived ?- empagliflozin (JARDIANCE) 25 MG TABS tablet; Take 1 tablet (25 mg total) by mouth daily.  Dispense: 90 tablet; Refill: 1 ? ?2. Diabetic nephropathy associated with type 2 diabetes mellitus ([place]) ?Working on better control of diabetes. Started on SGLT2 previously. Continue Lisinopril. ? ?3. Essential hypertension ?Well controlled on current regimen.  ?- Lipid panel ?- CBC with Differential/Platelet ?- CMP14+EGFR ? ?4. Stage 3b chronic kidney disease ([place]) ?Jardiance dose increased from 10 mg to 25 mg daily. Continue following with nephrology.  ?- CMP14+EGFR ?- empagliflozin (JARDIANCE) 25 MG TABS tablet; Take 1 tablet (25 mg total) by mouth daily.  Dispense: 90 tablet; Refill: 1 ? ?5. Hyperlipidemia LDL goal <70 ?Well controlled on current regimen.  ?- Lipid panel ?- CBC with Differential/Platelet ?- CMP14+EGFR ? ?6. Coronary artery disease of native artery of native heart with stable angina pectoris ([place]) ?Continue statin and aspirin. ?-  Lipid panel ?- CMP14+EGFR ? ?7. History of non-ST elevation myocardial infarction (NSTEMI) ?Continue statin and aspirin. ?- Lipid panel ?- CMP14+EGFR ? ?8. Chronic systolic heart failure ([place]) ?Managed by cardiology. ?- Lipid panel ?- CBC with Differential/Platelet ?- CMP14+EGFR ?- empagliflozin (JARDIANCE) 25 MG TABS tablet; Take 1 tablet (25 mg total) by mouth daily.  Dispense: 90 tablet; Refill: 1 ? ?9. Presence of implantable cardioverter-defibrillator (ICD) ?Managed by cardiology. ? ?10. Vitamin D deficiency ?Well controlled on current regimen.  ? ?11. Dry mouth ?Encouraged use of Biotene mouth wash. ? ?12. Immunization due ?- Varicella-zoster vaccine IM ([place]hing[place]) ? ? ?Return in about 3 months (around 07/16/2021) for follow-up of chronic medication conditions. ? ?Hendricks Limes, MSN, APRN, FNP-C ?[place] ? ?Subjective:  ? ? Patient ID: Omar Williams, male    DOB: 10/06/1935, 86 y.o.   MRN: [id number] ? ?Patient Care Team: ?Loman Brooklyn, FNP as PCP - General (Family Medicine) ?Thompson Grayer, MD as PCP - Electrophysiology (Cardiology) ?Shirl Harris, OD (Optometry) ?Verta Ellen., NP as Nurse Practitioner (Cardiology) ?Liana Gerold, MD as Consulting Physician (Nephrology) ?Irine Seal, MD as Attending Physician (Urology) ?Aviva Signs, MD as Consulting Physician (General Surgery)  ? ?Chief Complaint:  ?Chief Complaint  ?Patient presents with  ? Medical Management of Chronic Issues  ? ? ?HPI: ?Omar Williams is a 86 y.o. male presenting on 04/15/2021 for Medical Management of Chronic Issues ? ?Diabetes: Patient presents for follow up of diabetes. Current symptoms include: hyperglycemia. Known diabetic complications: nephropathy and cardiovascular disease.  Medication compliance: taking Jardiance 10 mg daily. Current diet: in general, a "healthy" diet  . Current exercise: walking and weightlifting. Home blood sugar records: BGs are running  consistent with Hgb A1C. Is he  on ACE  inhibitor or angiotensin II receptor blocker? Yes (Lisinopril). Is he on a statin? Yes (Atorvastatin).  ? ?CKD: Managed by Dr. Theador Hawthorne, nephrologist, who he last saw on 01/08/2021.  No changes at that time.  Patient to follow-up tomorrow. Started on Farxiga by me at our last visit to help with CKD and diabetes. Insurance did not cover and it was changed to [place].  ? ?CHF/history of V-Fib/hypertension/CAD/hyperlipidemia: Patient saw his electrophysiologist on 01/07/2021 and his general cardiologist on 01/03/2021. He has a follow-up appointment next month. ? ?Vitamin D Deficiency: normal vitamin D level in April 2022. He is taking an OTC supplement 5,000 units daily.  ? ?New complaints: ?Patient reports he has a dry mouth due to his medications. ? ? ?Social history: ? ?Relevant past medical, surgical, family and social history reviewed and updated as indicated. Interim medical history since our last visit reviewed. ? ?Allergies and medications reviewed and updated. ? ?DATA REVIEWED: CHART IN EPIC ? ?ROS: Negative unless specifically indicated above in HPI.  ? ? ?Current Outpatient Medications:  ?  aspirin EC 81 MG tablet, Take 81 mg by mouth daily., Disp: , Rfl:  ?  atorvastatin (LIPITOR) 80 MG tablet, TAKE 1 TABLET BY MOUTH EVERYDAY AT BEDTIME, Disp: 90 tablet, Rfl: 1 ?  Bee Pollen 550 MG CAPS, Take 2 capsules by mouth 2 (two) times daily., Disp: , Rfl:  ?  chlorhexidine (PERIDEX) 0.12 % solution, as needed., Disp: , Rfl:  ?  Cholecalciferol (VITAMIN D3) 125 MCG (5000 UT) TABS, Take 1 tablet by mouth daily. , Disp: , Rfl:  ?  CINNAMON PO, Take 1,000 mg by mouth in the morning and at bedtime., Disp: , Rfl:  ?  dapagliflozin propanediol (FARXIGA) 10 MG TABS tablet, Take by mouth daily., Disp: , Rfl:  ?  ezetimibe (ZETIA) 10 MG tablet, TAKE 1 TABLET BY MOUTH EVERY DAY, Disp: 90 tablet, Rfl: 1 ?  FIBER ADULT GUMMIES PO, Take 2 tablets by mouth at bedtime., Disp: , Rfl:  ?  finasteride (PROSCAR) 5 MG tablet, Take 1  tablet (5 mg total) by mouth at bedtime., Disp: 90 tablet, Rfl: 3 ?  furosemide (LASIX) 20 MG tablet, TAKE 1 TABLET BY MOUTH DAILY AS NEEDED FOR SWELLING, Disp: 90 tablet, Rfl: 1 ?  glimepiride (AMARYL) 4 MG tablet, TAKE 1 TABLET (4 MG TOTAL) BY MOUTH IN THE MORNING AND AT BEDTIME., Disp: 180 tablet, Rfl: 0 ?  glucose blood (ONETOUCH ULTRA) test strip, 1 each by Other route 2 (two) times daily. Use as instructed Dx E11.9, Disp: 100 strip, Rfl: 2 ?  JARDIANCE 10 MG TABS tablet, TAKE 1 TABLET BY MOUTH EVERY DAY, Disp: 30 tablet, Rfl: 0 ?  lisinopril (ZESTRIL) 2.5 MG tablet, TAKE 1 TABLET BY MOUTH EVERY DAY, Disp: 90 tablet, Rfl: 0 ?  nitroGLYCERIN (NITROSTAT) 0.4 MG SL tablet, Place 1 tablet (0.4 mg total) under the tongue every 5 (five) minutes x 3 doses as needed for chest pain., Disp: 25 tablet, Rfl: 3 ?  Omega-3 Fatty Acids (FISH OIL) 1200 MG CAPS, Take 1 capsule by mouth 2 (two) times daily. , Disp: , Rfl:  ?  oxybutynin (DITROPAN) 5 MG tablet, Take 0.5 tablets (2.5 mg total) by mouth 3 (three) times daily., Disp: 135 tablet, Rfl: 3 ?  pantoprazole (PROTONIX) 40 MG tablet, Take 1 tablet (40 mg total) by mouth daily., Disp: 90 tablet, Rfl: 1 ?  senna-docusate (SENOKOT-S) 8.6-50 MG tablet, Take 1 tablet by mouth 2 (two) times daily as needed for mild constipation., Disp: 180 tablet, Rfl: 0 ?  tamsulosin (FLOMAX) 0.4 MG CAPS capsule, Take 1 capsule (0.4 mg total) by mouth every morning., Disp: 90 capsule, Rfl: 3 ?  TURMERIC PO, Take 1,000 mg by mouth 2 (two) times daily., Disp: , Rfl:  ?  vitamin B-12 (CYANOCOBALAMIN) 1000 MCG tablet, Take 1,000 mcg by mouth every morning. , Disp: , Rfl:  ?  vitamin C (ASCORBIC ACID) 500 MG tablet, Take 500 mg by mouth daily., Disp: , Rfl:   ? ?No Known Allergies ?Past Medical History:  ?Diagnosis Date  ? Acid reflux   ? Blood transfusion without reported diagnosis   ? Cardiac arrest [facility])   ? a. occuring in 12/2016. LAD disease but no targets for CABG or PCI. Underwent ICD  placement as EF 15-20%.   ? CHF (congestive heart failure) ([place])   ? Coronary artery disease   ? Diabetes mellitus without complication ([place])   ? Diabetic nephropathy ([place])   ? Hyperlipidemia   ? Hypertension   ? Myocar

## 2021-04-16 DIAGNOSIS — E1122 Type 2 diabetes mellitus with diabetic chronic kidney disease: Secondary | ICD-10-CM | POA: Diagnosis not present

## 2021-04-16 DIAGNOSIS — N189 Chronic kidney disease, unspecified: Secondary | ICD-10-CM | POA: Diagnosis not present

## 2021-04-16 DIAGNOSIS — R809 Proteinuria, unspecified: Secondary | ICD-10-CM | POA: Diagnosis not present

## 2021-04-16 DIAGNOSIS — E1129 Type 2 diabetes mellitus with other diabetic kidney complication: Secondary | ICD-10-CM | POA: Diagnosis not present

## 2021-04-16 DIAGNOSIS — I9589 Other hypotension: Secondary | ICD-10-CM | POA: Diagnosis not present

## 2021-04-16 DIAGNOSIS — E875 Hyperkalemia: Secondary | ICD-10-CM | POA: Diagnosis not present

## 2021-04-16 LAB — LIPID PANEL
Chol/HDL Ratio: 2.3 ratio (ref 0.0–5.0)
Cholesterol, Total: 133 mg/dL (ref 100–199)
HDL: 57 mg/dL (ref >39)
LDL Chol Calc (NIH): 61 mg/dL (ref 0–99)
Triglycerides: 78 mg/dL (ref 0–149)
VLDL Cholesterol Cal: 15 mg/dL (ref 5–40)

## 2021-04-16 LAB — CMP14+EGFR
ALT: 21 IU/L (ref 0–44)
AST: 22 IU/L (ref 0–40)
Albumin/Globulin Ratio: 2 (ref 1.2–2.2)
Albumin: 4.4 g/dL (ref 3.6–4.6)
Alkaline Phosphatase: 80 IU/L (ref 44–121)
BUN/Creatinine Ratio: 16 (ref 10–24)
BUN: 22 mg/dL (ref 8–27)
Bilirubin Total: 0.7 mg/dL (ref 0.0–1.2)
CO2: 25 mmol/L (ref 20–29)
Calcium: 9.5 mg/dL (ref 8.6–10.2)
Chloride: 106 mmol/L (ref 96–106)
Creatinine, Ser: 1.41 mg/dL — ABNORMAL HIGH (ref 0.76–1.27)
Globulin, Total: 2.2 g/dL (ref 1.5–4.5)
Glucose: 129 mg/dL — ABNORMAL HIGH (ref 70–99)
Potassium: 4.5 mmol/L (ref 3.5–5.2)
Sodium: 144 mmol/L (ref 134–144)
Total Protein: 6.6 g/dL (ref 6.0–8.5)
eGFR: 49 mL/min/{1.73_m2} — ABNORMAL LOW (ref >59)

## 2021-04-16 LAB — CBC WITH DIFFERENTIAL/PLATELET
Basophils Absolute: 0.1 10*3/uL (ref 0.0–0.2)
Basos: 1 %
EOS (ABSOLUTE): 0.5 10*3/uL — ABNORMAL HIGH (ref 0.0–0.4)
Eos: 5 %
Hematocrit: 44.7 % (ref 37.5–51.0)
Hemoglobin: 14.5 g/dL (ref 13.0–17.7)
Immature Grans (Abs): 0 10*3/uL (ref 0.0–0.1)
Immature Granulocytes: 0 %
Lymphocytes Absolute: 2.5 10*3/uL (ref 0.7–3.1)
Lymphs: 27 %
MCH: 31.3 pg (ref 26.6–33.0)
MCHC: 32.4 g/dL (ref 31.5–35.7)
MCV: 96 fL (ref 79–97)
Monocytes Absolute: 1.1 10*3/uL — ABNORMAL HIGH (ref 0.1–0.9)
Monocytes: 12 %
Neutrophils Absolute: 5.2 10*3/uL (ref 1.4–7.0)
Neutrophils: 55 %
Platelets: 215 10*3/uL (ref 150–450)
RBC: 4.64 x10E6/uL (ref 4.14–5.80)
RDW: 12.9 % (ref 11.6–15.4)
WBC: 9.4 10*3/uL (ref 3.4–10.8)

## 2021-04-23 NOTE — Progress Notes (Signed)
Remote ICD transmission.   

## 2021-04-24 DIAGNOSIS — Z20822 Contact with and (suspected) exposure to covid-19: Secondary | ICD-10-CM | POA: Diagnosis not present

## 2021-05-05 ENCOUNTER — Ambulatory Visit (INDEPENDENT_AMBULATORY_CARE_PROVIDER_SITE_OTHER): Payer: Medicare Other

## 2021-05-05 DIAGNOSIS — Z95810 Presence of automatic (implantable) cardiac defibrillator: Secondary | ICD-10-CM | POA: Diagnosis not present

## 2021-05-05 DIAGNOSIS — I5022 Chronic systolic (congestive) heart failure: Secondary | ICD-10-CM

## 2021-05-05 NOTE — Progress Notes (Signed)
EPIC Encounter for ICM Monitoring ? ?Patient Name: Omar Williams is a 86 y.o. male ?Date: 05/05/2021 ?Primary Care Physican: Gwenlyn Fudge, FNP ?Primary Cardiologist:  Branch ?Electrophysiologist: Allred ?Nephrologist: [facility] ?Bi-V Pacing: 99.2% ?05/05/2021 Weight: 158 lbs ?  ?Spoke with patient and heart failure questions reviewed.  Pt asymptomatic for fluid accumulation and feeling well.  Leaves for [place] on 4/19 and be there a couple of weeks.  ?  ?Optivol Thoracic impedance suggesting normal fluid levels. ?  ?Prescribed:  ?Furosemide 20 mg take 1 tablet as needed for swelling.  Pt reports taking Furosemide daily instead of PRN. ?  ?Labs: ?08/20/2020 Creatinine 1.34, BUN 18, Potassium 4.4, Sodium 142 ?05/17/2020 Creatinine 1.17, BUN 14, Potassium 4.4, Sodium 143 ?05/09/2020 Creatinine 1.01, BUN 17, Potassium 4.0, Sodium 138, GFR 69-80 ?04/10/2020 Creatinine 1.07, BUN 16, Potassium 4.3, Sodium 139, GFR 64-75 ?03/30/2020 Creatinine 1.02, BUN 24, Potassium 4.2, Sodium 133, GFR 68-79 ?03/29/2020 Creatinine 1.24, BUN 28, Potassium 4.4, Sodium 135, GFR 54-62 Care Everywhere  ?A complete set of results can be found in Results Review. ?  ?Recommendations:  No changes and encouraged to call if experiencing any fluid symptoms. ?  ?Follow-up plan: ICM clinic phone appointment on 06/09/2020.   91 day device clinic remote transmission 07/09/2021.     ?  ?EP/Cardiology Office Visits: 05/14/2021 with Dr Wyline Mood.  Recall 07/06/2021 with Dr Ladona Ridgel     ?  ?Copy of ICM check sent to Dr. Johney Frame.  ? ?3 month ICM trend: 05/05/2021. ? ? ? ?12-14 Month ICM trend:  ? ? ? ?Karie Soda, RN ?05/05/2021 ?2:14 PM ? ?

## 2021-05-10 ENCOUNTER — Other Ambulatory Visit: Payer: Self-pay | Admitting: Family Medicine

## 2021-05-10 DIAGNOSIS — E785 Hyperlipidemia, unspecified: Secondary | ICD-10-CM

## 2021-05-14 ENCOUNTER — Encounter: Payer: Self-pay | Admitting: Cardiology

## 2021-05-14 ENCOUNTER — Ambulatory Visit (INDEPENDENT_AMBULATORY_CARE_PROVIDER_SITE_OTHER): Payer: Medicare Other | Admitting: Cardiology

## 2021-05-14 VITALS — BP 110/60 | HR 60 | Ht 69.0 in | Wt 159.2 lb

## 2021-05-14 DIAGNOSIS — E782 Mixed hyperlipidemia: Secondary | ICD-10-CM | POA: Diagnosis not present

## 2021-05-14 DIAGNOSIS — I255 Ischemic cardiomyopathy: Secondary | ICD-10-CM | POA: Diagnosis not present

## 2021-05-14 DIAGNOSIS — I5022 Chronic systolic (congestive) heart failure: Secondary | ICD-10-CM

## 2021-05-14 DIAGNOSIS — I2510 Atherosclerotic heart disease of native coronary artery without angina pectoris: Secondary | ICD-10-CM

## 2021-05-14 DIAGNOSIS — I10 Essential (primary) hypertension: Secondary | ICD-10-CM

## 2021-05-14 NOTE — Progress Notes (Signed)
? ? ? ?Clinical Summary ?Omar Williams is a 86 y.o.male seen today for follow up of the following medical problems.  ?  ?1. ICM/Chronic systolic HF/CAD ?- history of LAD disease without target for revasc ?  ?- has BiV AICD, 04/2021 normal device check ?- 06/2017 echo LVEF 40-45%.  ?- medical therapy has been limited by low bp's. Has had some prior issues with hyperkalemia in the past ?  ?  ?- had been on coreg 6.25mg  bid and losartan 25mg  daily at 07/2020 visit. Somewhere betwee June and July meds changed and now just on lisinopril 2.5mg  daily. Im not clear on this history.  ?- takes lasix 20mg  daily ? ?Jan 2023 echo: LVEF 50%, grade I dd ?- he is on lisinopril, jardiance ?  ?2. HTN ?- he is compliant with meds ?  ?  ?3. Hyperlipidemia ?- 08/2019 TC 134 HDL 43 TG 132 LDL 69 ?- he is on atorva 80mg  and zetia 10mg  ?  ? 04/2021 TC 133 TG 78 HDL 57 LDL 61 ?  ?4. CKD ?- followed by Dr Theador Hawthorne ?  ?Past Medical History:  ?Diagnosis Date  ? Acid reflux   ? Blood transfusion without reported diagnosis   ? Cardiac arrest [facility])   ? a. occuring in 12/2016. LAD disease but no targets for CABG or PCI. Underwent ICD placement as EF 15-20%.   ? CHF (congestive heart failure) ([place])   ? Coronary artery disease   ? Diabetes mellitus without complication ([place])   ? Diabetic nephropathy ([place])   ? Hyperlipidemia   ? Hypertension   ? Myocardial infarction [facility])   ? Renal disorder   ? ? ? ?No Known Allergies ? ? ?Current Outpatient Medications  ?Medication Sig Dispense Refill  ? aspirin EC 81 MG tablet Take 81 mg by mouth daily.    ? atorvastatin (LIPITOR) 80 MG tablet TAKE 1 TABLET BY MOUTH EVERYDAY AT BEDTIME 90 tablet 1  ? Bee Pollen 550 MG CAPS Take 2 capsules by mouth 2 (two) times daily.    ? chlorhexidine (PERIDEX) 0.12 % solution as needed.    ? Cholecalciferol (VITAMIN D3) 125 MCG (5000 UT) TABS Take 1 tablet by mouth daily.     ? CINNAMON PO Take 1,000 mg by mouth in the morning and at bedtime.    ? empagliflozin (JARDIANCE) 25 MG TABS  tablet Take 1 tablet (25 mg total) by mouth daily. 90 tablet 1  ? ezetimibe (ZETIA) 10 MG tablet TAKE 1 TABLET BY MOUTH EVERY DAY 90 tablet 1  ? FIBER ADULT GUMMIES PO Take 2 tablets by mouth at bedtime.    ? finasteride (PROSCAR) 5 MG tablet Take 1 tablet (5 mg total) by mouth at bedtime. 90 tablet 3  ? furosemide (LASIX) 20 MG tablet TAKE 1 TABLET BY MOUTH DAILY AS NEEDED FOR SWELLING 90 tablet 1  ? glimepiride (AMARYL) 4 MG tablet TAKE 1 TABLET (4 MG TOTAL) BY MOUTH IN THE MORNING AND AT BEDTIME. 180 tablet 0  ? glucose blood (ONETOUCH ULTRA) test strip 1 each by Other route 2 (two) times daily. Use as instructed Dx E11.9 100 strip 2  ? lisinopril (ZESTRIL) 2.5 MG tablet TAKE 1 TABLET BY MOUTH EVERY DAY 90 tablet 0  ? nitroGLYCERIN (NITROSTAT) 0.4 MG SL tablet Place 1 tablet (0.4 mg total) under the tongue every 5 (five) minutes x 3 doses as needed for chest pain. 25 tablet 3  ? Omega-3 Fatty Acids (FISH OIL) 1200 MG CAPS Take  1 capsule by mouth 2 (two) times daily.     ? oxybutynin (DITROPAN) 5 MG tablet Take 0.5 tablets (2.5 mg total) by mouth 3 (three) times daily. 135 tablet 3  ? pantoprazole (PROTONIX) 40 MG tablet Take 1 tablet (40 mg total) by mouth daily. 90 tablet 1  ? senna-docusate (SENOKOT-S) 8.6-50 MG tablet Take 1 tablet by mouth 2 (two) times daily as needed for mild constipation. 180 tablet 0  ? tamsulosin (FLOMAX) 0.4 MG CAPS capsule Take 1 capsule (0.4 mg total) by mouth every morning. 90 capsule 3  ? TURMERIC PO Take 1,000 mg by mouth 2 (two) times daily.    ? vitamin B-12 (CYANOCOBALAMIN) 1000 MCG tablet Take 1,000 mcg by mouth every morning.     ? vitamin C (ASCORBIC ACID) 500 MG tablet Take 500 mg by mouth daily.    ? ?No current facility-administered medications for this visit.  ? ? ? ?Past Surgical History:  ?Procedure Laterality Date  ? BIV ICD INSERTION CRT-D N/A 01/01/2017  ? Procedure: BIV ICD INSERTION CRT-D;  Surgeon: Constance Haw, MD;  Location: [place] [facility] [place] [facility];  Service:  Cardiovascular;  Laterality: N/A;  ? ? ? ?No Known Allergies ? ? ? ?Family History  ?Problem Relation Age of Onset  ? Hypertension Mother   ? Hyperlipidemia Mother   ? CAD Sister   ? Diabetes Sister   ? Hyperlipidemia Sister   ? Hypertension Sister   ? CAD Brother   ? Diabetes Brother   ? Hypertension Brother   ? Hyperlipidemia Brother   ? CAD Brother   ? Diabetes Brother   ? Hypertension Brother   ? Hyperlipidemia Brother   ? Dementia Brother   ? Thyroid disease Daughter   ? Hypertension Daughter   ? Diabetes Daughter   ? Hyperlipidemia Daughter   ? Fibromyalgia Daughter   ? Heart disease Daughter   ?     stents  ? Multiple sclerosis Daughter   ? Thyroid disease Daughter   ? ? ? ?Social History ?Omar Williams reports that he quit smoking about 63 years ago. His smoking use included cigarettes. He has never used smokeless tobacco. ?Omar Williams reports no history of alcohol use. ? ? ?Review of Systems ?CONSTITUTIONAL: No weight loss, fever, chills, weakness or fatigue.  ?HEENT: Eyes: No visual loss, blurred vision, double vision or yellow sclerae.No hearing loss, sneezing, congestion, runny nose or sore throat.  ?SKIN: No rash or itching.  ?CARDIOVASCULAR: per hpi ?RESPIRATORY: No shortness of breath, cough or sputum.  ?GASTROINTESTINAL: No anorexia, nausea, vomiting or diarrhea. No abdominal pain or blood.  ?GENITOURINARY: No burning on urination, no polyuria ?NEUROLOGICAL: No headache, dizziness, syncope, paralysis, ataxia, numbness or tingling in the extremities. No change in bowel or bladder control.  ?MUSCULOSKELETAL: No muscle, back pain, joint pain or stiffness.  ?LYMPHATICS: No enlarged nodes. No history of splenectomy.  ?PSYCHIATRIC: No history of depression or anxiety.  ?ENDOCRINOLOGIC: No reports of sweating, cold or heat intolerance. No polyuria or polydipsia.  ?. ? ? ?Physical Examination ?Today's Vitals  ? 05/14/21 0859  ?BP: 110/60  ?Pulse: 60  ?SpO2: 98%  ?Weight: 159 lb 3.2 oz (72.2 kg)  ?Height: 5\' 9"   (1.753 m)  ? ?Body mass index is 23.51 kg/m?. ? ?Gen: resting comfortably, no acute distress ?HEENT: no scleral icterus, pupils equal round and reactive, no palptable cervical adenopathy,  ?CV: RRR, no mr/g no jvd ?Resp: Clear to auscultation bilaterally ?GI: abdomen is soft, non-tender, non-distended, normal  bowel sounds, no hepatosplenomegaly ?MSK: extremities are warm, no edema.  ?Skin: warm, no rash ?Neuro:  no focal deficits ?Psych: appropriate affect ? ? ?Diagnostic Studies ? ?Echo 06/09/17: ?  ?Study Conclusions ?  ?- Left ventricle: The cavity size was normal. Wall thickness was ?  normal. Systolic function was mildly to moderately reduced. The ?  estimated ejection fraction was in the range of 40% to 45%. ?  Diffuse hypokinesis. Doppler parameters are consistent with ?  abnormal left ventricular relaxation (grade 1 diastolic ?  dysfunction). ?- Aortic valve: Poorly visualized. Probably trileaflet. There was ?  mild regurgitation. ?- Mitral valve: There was trivial regurgitation. ?- Left atrium: The atrium was mildly dilated. ?- Right ventricle: Pacer wire or catheter noted in right ventricle. ?- Right atrium: Central venous pressure (est): 3 mm Hg. ?- Atrial septum: No defect or patent foramen ovale was identified. ?- Tricuspid valve: There was trivial regurgitation. ?- Pulmonary arteries: PA peak pressure: 13 mm Hg (S). ?- Pericardium, extracardiac: There was no pericardial effusion. ?  ?Impressions: ?  ?- Images are limited, but LVEF has improved in comparison to the ?  previous study in November 2018. ?  ?06/2017 echo ?Study Conclusions  ? ?- Left ventricle: The cavity size was normal. Wall thickness was  ?  normal. Systolic function was mildly to moderately reduced. The  ?  estimated ejection fraction was in the range of 40% to 45%.  ?  Diffuse hypokinesis. Doppler parameters are consistent with  ?  abnormal left ventricular relaxation (grade 1 diastolic  ?  dysfunction).  ?- Aortic valve: Poorly  visualized. Probably trileaflet. There was  ?  mild regurgitation.  ?- Mitral valve: There was trivial regurgitation.  ?- Left atrium: The atrium was mildly dilated.  ?- Right ventricle: Pacer wire or catheter noted

## 2021-05-14 NOTE — Patient Instructions (Signed)

## 2021-05-30 DIAGNOSIS — Z20822 Contact with and (suspected) exposure to covid-19: Secondary | ICD-10-CM | POA: Diagnosis not present

## 2021-06-04 DIAGNOSIS — Z20822 Contact with and (suspected) exposure to covid-19: Secondary | ICD-10-CM | POA: Diagnosis not present

## 2021-06-09 ENCOUNTER — Ambulatory Visit (INDEPENDENT_AMBULATORY_CARE_PROVIDER_SITE_OTHER): Payer: Medicare Other

## 2021-06-09 DIAGNOSIS — Z95810 Presence of automatic (implantable) cardiac defibrillator: Secondary | ICD-10-CM

## 2021-06-09 DIAGNOSIS — I5022 Chronic systolic (congestive) heart failure: Secondary | ICD-10-CM | POA: Diagnosis not present

## 2021-06-10 ENCOUNTER — Other Ambulatory Visit: Payer: Self-pay | Admitting: Family Medicine

## 2021-06-10 DIAGNOSIS — K219 Gastro-esophageal reflux disease without esophagitis: Secondary | ICD-10-CM

## 2021-06-10 NOTE — Progress Notes (Signed)
EPIC Encounter for ICM Monitoring ? ?Patient Name: Omar Williams is a 86 y.o. male ?Date: 06/10/2021 ?Primary Care Physican: Gwenlyn Fudge, FNP ?Primary Cardiologist:  Branch ?Electrophysiologist: Allred ?Nephrologist: [facility] ?Bi-V Pacing: 99.0% ?05/05/2021 Weight: 158 lbs ?06/10/2021 Weight: 160 lbs ?  ?Spoke with patient and heart failure questions reviewed.  Pt is feeling well.  Weight up 2 lbs since returning from 2 week [place] vacation.   Eating foods high in salt. ?  ?Optivol Thoracic impedance suggesting possible fluid accumulation starting 4/26 but trending back to baseline.  He has been in [place] for past 2 weeks and eating in restaurants. ?  ?Prescribed:  ?Furosemide 20 mg take 1 tablet as needed for swelling.  Pt reports taking Furosemide daily instead of PRN. ?  ?Labs: ?04/15/2021 Creatinine 1.41, BUN 22, Potassium 4.5, Sodium 144, GFR 49 ?A complete set of results can be found in Results Review. ?  ?Recommendations:  Recommendation to limit salt intake to 2000 mg daily and fluid intake to 64 oz daily.   ?  ?Follow-up plan: ICM clinic phone appointment on 06/17/2021 to recheck fluid levels.   91 day device clinic remote transmission 07/09/2021.     ?  ?EP/Cardiology Office Visits: 11/19/2021 with Dr Wyline Mood.  07/15/2021 with Dr Ladona Ridgel     ?  ?Copy of ICM check sent to Dr. Johney Frame and Dr Wyline Mood as [place]ain Childes and review.  ? ?3 month ICM trend: 06/09/2021. ? ? ? ?12-14 Month ICM trend:  ? ? ? ?Karie Soda, RN ?06/10/2021 ?9:49 AM ? ?

## 2021-06-12 ENCOUNTER — Other Ambulatory Visit: Payer: Self-pay | Admitting: Family Medicine

## 2021-06-12 DIAGNOSIS — E1121 Type 2 diabetes mellitus with diabetic nephropathy: Secondary | ICD-10-CM

## 2021-06-16 ENCOUNTER — Ambulatory Visit (INDEPENDENT_AMBULATORY_CARE_PROVIDER_SITE_OTHER): Payer: Medicare Other

## 2021-06-16 VITALS — BP 116/62 | HR 60 | Ht 69.0 in | Wt 160.0 lb

## 2021-06-16 DIAGNOSIS — Z0000 Encounter for general adult medical examination without abnormal findings: Secondary | ICD-10-CM | POA: Diagnosis not present

## 2021-06-16 NOTE — Patient Instructions (Signed)
Omar Williams , ?Thank you for taking time to come for your Medicare Wellness Visit. I appreciate your ongoing commitment to your health goals. Please review the following plan we discussed and let me know if I can assist you in the future.  ? ?Screening recommendations/referrals: ?Colonoscopy: No longer required due to age. ? ?Recommended yearly ophthalmology/optometry visit for glaucoma screening and checkup ?Recommended yearly dental visit for hygiene and checkup ? ?Vaccinations: ?Influenza vaccine: Due Fall 2023. ?Pneumococcal vaccine: Done 02/14/2015, 08/26/2017 and 11/06/2020  ?Tdap vaccine: Done 09/13/2018 Repeat in 10 years ? ?Shingles vaccine: Done 04/15/2021. Second dose due in 2 months.    ?Covid-19: Declined. ? ?Advanced directives: Please bring a copy of your health care power of attorney and living will to the office to be added to your chart at your convenience. ? ? ?Conditions/risks identified: KEEP UP THE GOOD WORK!! ? ?Next appointment: Follow up in one year for your annual wellness visit. 2024. ? ?Preventive Care 84 Years and Older, Male ? ?Preventive care refers to lifestyle choices and visits with your health care provider that can promote health and wellness. ?What does preventive care include? ?A yearly physical exam. This is also called an annual well check. ?Dental exams once or twice a year. ?Routine eye exams. Ask your health care provider how often you should have your eyes checked. ?Personal lifestyle choices, including: ?Daily care of your teeth and gums. ?Regular physical activity. ?Eating a healthy diet. ?Avoiding tobacco and drug use. ?Limiting alcohol use. ?Practicing safe sex. ?Taking low doses of aspirin every day. ?Taking vitamin and mineral supplements as recommended by your health care provider. ?What happens during an annual well check? ?The services and screenings done by your health care provider during your annual well check will depend on your age, overall health, lifestyle risk  factors, and family history of disease. ?Counseling  ?Your health care provider may ask you questions about your: ?Alcohol use. ?Tobacco use. ?Drug use. ?Emotional well-being. ?Home and relationship well-being. ?Sexual activity. ?Eating habits. ?History of falls. ?Memory and ability to understand (cognition). ?Work and work Astronomer. ?Screening  ?You may have the following tests or measurements: ?Height, weight, and BMI. ?Blood pressure. ?Lipid and cholesterol levels. These may be checked every 5 years, or more frequently if you are over 47 years old. ?Skin check. ?Lung cancer screening. You may have this screening every year starting at age 76 if you have a 30-pack-year history of smoking and currently smoke or have quit within the past 15 years. ?Fecal occult blood test (FOBT) of the stool. You may have this test every year starting at age 66. ?Flexible sigmoidoscopy or colonoscopy. You may have a sigmoidoscopy every 5 years or a colonoscopy every 10 years starting at age 25. ?Prostate cancer screening. Recommendations will vary depending on your family history and other risks. ?Hepatitis C blood test. ?Hepatitis B blood test. ?Sexually transmitted disease (STD) testing. ?Diabetes screening. This is done by checking your blood sugar (glucose) after you have not eaten for a while (fasting). You may have this done every 1-3 years. ?Abdominal aortic aneurysm (AAA) screening. You may need this if you are a current or former smoker. ?Osteoporosis. You may be screened starting at age 54 if you are at high risk. ?Talk with your health care provider about your test results, treatment options, and if necessary, the need for more tests. ?Vaccines  ?Your health care provider may recommend certain vaccines, such as: ?Influenza vaccine. This is recommended every year. ?Tetanus, diphtheria, and  acellular pertussis (Tdap, Td) vaccine. You may need a Td booster every 10 years. ?Zoster vaccine. You may need this after age  6. ?Pneumococcal 13-valent conjugate (PCV13) vaccine. One dose is recommended after age 39. ?Pneumococcal polysaccharide (PPSV23) vaccine. One dose is recommended after age 41. ?Talk to your health care provider about which screenings and vaccines you need and how often you need them. ?This information is not intended to replace advice given to you by your health care provider. Make sure you discuss any questions you have with your health care provider. ?Document Released: 02/15/2015 Document Revised: 10/09/2015 Document Reviewed: 11/20/2014 ?Elsevier Interactive Patient Education ? 2017 [employer]. ? ?Fall Prevention in the Home ?Falls can cause injuries. They can happen to people of all ages. There are many things you can do to make your home safe and to help prevent falls. ?What can I do on the outside of my home? ?Regularly fix the edges of walkways and driveways and fix any cracks. ?Remove anything that might make you trip as you walk through a door, such as a raised step or threshold. ?Trim any bushes or trees on the path to your home. ?Use bright outdoor lighting. ?Clear any walking paths of anything that might make someone trip, such as rocks or tools. ?Regularly check to see if handrails are loose or broken. Make sure that both sides of any steps have handrails. ?Any raised decks and porches should have guardrails on the edges. ?Have any leaves, snow, or ice cleared regularly. ?Use sand or salt on walking paths during winter. ?Clean up any spills in your garage right away. This includes oil or grease spills. ?What can I do in the bathroom? ?Use night lights. ?Install grab bars by the toilet and in the tub and shower. Do not use towel bars as grab bars. ?Use non-skid mats or decals in the tub or shower. ?If you need to sit down in the shower, use a plastic, non-slip stool. ?Keep the floor dry. Clean up any water that spills on the floor as soon as it happens. ?Remove soap buildup in the tub or shower  regularly. ?Attach bath mats securely with double-sided non-slip rug tape. ?Do not have throw rugs and other things on the floor that can make you trip. ?What can I do in the bedroom? ?Use night lights. ?Make sure that you have a light by your bed that is easy to reach. ?Do not use any sheets or blankets that are too big for your bed. They should not hang down onto the floor. ?Have a firm chair that has side arms. You can use this for support while you get dressed. ?Do not have throw rugs and other things on the floor that can make you trip. ?What can I do in the kitchen? ?Clean up any spills right away. ?Avoid walking on wet floors. ?Keep items that you use a lot in easy-to-reach places. ?If you need to reach something above you, use a strong step stool that has a grab bar. ?Keep electrical cords out of the way. ?Do not use floor polish or wax that makes floors slippery. If you must use wax, use non-skid floor wax. ?Do not have throw rugs and other things on the floor that can make you trip. ?What can I do with my stairs? ?Do not leave any items on the stairs. ?Make sure that there are handrails on both sides of the stairs and use them. Fix handrails that are broken or loose. Make sure that  handrails are as long as the stairways. ?Check any carpeting to make sure that it is firmly attached to the stairs. Fix any carpet that is loose or worn. ?Avoid having throw rugs at the top or bottom of the stairs. If you do have throw rugs, attach them to the floor with carpet tape. ?Make sure that you have a light switch at the top of the stairs and the bottom of the stairs. If you do not have them, ask someone to add them for you. ?What else can I do to help prevent falls? ?Wear shoes that: ?Do not have high heels. ?Have rubber bottoms. ?Are comfortable and fit you well. ?Are closed at the toe. Do not wear sandals. ?If you use a stepladder: ?Make sure that it is fully opened. Do not climb a closed stepladder. ?Make sure that  both sides of the stepladder are locked into place. ?Ask someone to hold it for you, if possible. ?Clearly mark and make sure that you can see: ?Any grab bars or handrails. ?First and last steps. ?Where the edge

## 2021-06-16 NOTE — Progress Notes (Signed)
? ?Subjective:  ? Omar Williams is a 86 y.o. male who presents for Medicare Annual/Subsequent preventive examination. ?Virtual Visit via Telephone Note ? ?I connected with  Omar Williams on 06/16/21 at  3:30 PM EDT by telephone and verified that I am speaking with the correct person using two identifiers. ? ?Location: ?Patient: HOME ?Provider: [facility] ?Persons participating in the virtual visit: patient/Nurse Health Advisor ?  ?I discussed the limitations, risks, security and privacy concerns of performing an evaluation and management service by telephone and the availability of in person appointments. The patient expressed understanding and agreed to proceed. ? ?Interactive audio and video telecommunications were attempted between this nurse and patient, however failed, due to patient having technical difficulties OR patient did not have access to video capability.  We continued and completed visit with audio only. ? ?Some vital signs may be absent or patient reported.  ? ?Darral Dash, LPN ? ?Review of Systems    ? ?Cardiac Risk Factors include: advanced age (>37men, >66 women);hypertension;dyslipidemia;diabetes mellitus;male gender;Other (see comment), Risk factor comments: CHF ? ?   ?Objective:  ?  ?Today's Vitals  ? 06/16/21 1529  ?BP: 116/62  ?Pulse: 60  ?SpO2: 96%  ?Weight: 160 lb (72.6 kg)  ?Height: 5\' 9"  (1.753 m)  ? ?Body mass index is 23.63 kg/m?. ? ? ?  06/16/2021  ?  3:39 PM 06/12/2020  ?  1:15 PM 06/02/2019  ?  9:12 AM 01/25/2017  ?  7:25 AM 12/30/2016  ? 10:00 PM  ?Advanced Directives  ?Does Patient Have a Medical Advance Directive? No No No Yes No  ?Type of 01/01/2017 of Attorney   ?Would patient like information on creating a medical advance directive? No - Patient declined No - Patient declined No - Patient declined  No - Patient declined  ? ? ?Current Medications (verified) ?Outpatient Encounter Medications as of 06/16/2021  ?Medication Sig  ? aspirin EC 81 MG tablet Take 81  mg by mouth daily.  ? atorvastatin (LIPITOR) 80 MG tablet TAKE 1 TABLET BY MOUTH EVERYDAY AT BEDTIME  ? Bee Pollen 550 MG CAPS Take 2 capsules by mouth 2 (two) times daily.  ? chlorhexidine (PERIDEX) 0.12 % solution as needed.  ? Cholecalciferol (VITAMIN D3) 125 MCG (5000 UT) TABS Take 1 tablet by mouth daily.   ? CINNAMON PO Take 1,000 mg by mouth in the morning and at bedtime.  ? docusate sodium (COLACE) 50 MG capsule Take 100 mg by mouth 2 (two) times daily.  ? empagliflozin (JARDIANCE) 25 MG TABS tablet Take 1 tablet (25 mg total) by mouth daily.  ? ezetimibe (ZETIA) 10 MG tablet TAKE 1 TABLET BY MOUTH EVERY DAY  ? FIBER ADULT GUMMIES PO Take 2 tablets by mouth at bedtime.  ? finasteride (PROSCAR) 5 MG tablet Take 1 tablet (5 mg total) by mouth at bedtime.  ? furosemide (LASIX) 20 MG tablet TAKE 1 TABLET BY MOUTH DAILY AS NEEDED FOR SWELLING  ? glimepiride (AMARYL) 4 MG tablet TAKE 1 TABLET (4 MG TOTAL) BY MOUTH IN THE MORNING AND AT BEDTIME.  ? glucose blood (ONETOUCH ULTRA) test strip 1 each by Other route 2 (two) times daily. Use as instructed Dx E11.9  ? lisinopril (ZESTRIL) 2.5 MG tablet TAKE 1 TABLET BY MOUTH EVERY DAY  ? nitroGLYCERIN (NITROSTAT) 0.4 MG SL tablet Place 1 tablet (0.4 mg total) under the tongue every 5 (five) minutes x 3 doses as needed for chest pain.  ?  Omega-3 Fatty Acids (FISH OIL) 1200 MG CAPS Take 1 capsule by mouth 2 (two) times daily.   ? oxybutynin (DITROPAN) 5 MG tablet Take 0.5 tablets (2.5 mg total) by mouth 3 (three) times daily.  ? pantoprazole (PROTONIX) 40 MG tablet Take by mouth.  ? tamsulosin (FLOMAX) 0.4 MG CAPS capsule Take 1 capsule (0.4 mg total) by mouth every morning.  ? TURMERIC PO Take 1,000 mg by mouth 2 (two) times daily.  ? vitamin B-12 (CYANOCOBALAMIN) 1000 MCG tablet Take 1,000 mcg by mouth every morning.   ? vitamin C (ASCORBIC ACID) 500 MG tablet Take 1,000 mg by mouth 2 (two) times daily.  ? [DISCONTINUED] pantoprazole (PROTONIX) 40 MG tablet TAKE 1 TABLET  BY MOUTH EVERY DAY  ? ?No facility-administered encounter medications on file as of 06/16/2021.  ? ? ?Allergies (verified) ?Patient has no known allergies.  ? ?History: ?Past Medical History:  ?Diagnosis Date  ? Acid reflux   ? Blood transfusion without reported diagnosis   ? Cardiac arrest [facility])   ? a. occuring in 12/2016. LAD disease but no targets for CABG or PCI. Underwent ICD placement as EF 15-20%.   ? CHF (congestive heart failure) (HCC)   ? Coronary artery disease   ? Diabetes mellitus without complication (HCC)   ? Diabetic nephropathy (HCC)   ? Hyperlipidemia   ? Hypertension   ? Myocardial infarction [facility])   ? Renal disorder   ? ?Past Surgical History:  ?Procedure Laterality Date  ? BIV ICD INSERTION CRT-D N/A 01/01/2017  ? Procedure: BIV ICD INSERTION CRT-D;  Surgeon: Regan Lemming, MD;  Location: [facility] INVASIVE CV LAB;  Service: Cardiovascular;  Laterality: N/A;  ? ?Family History  ?Problem Relation Age of Onset  ? Hypertension Mother   ? Hyperlipidemia Mother   ? CAD Sister   ? Diabetes Sister   ? Hyperlipidemia Sister   ? Hypertension Sister   ? CAD Brother   ? Diabetes Brother   ? Hypertension Brother   ? Hyperlipidemia Brother   ? CAD Brother   ? Diabetes Brother   ? Hypertension Brother   ? Hyperlipidemia Brother   ? Dementia Brother   ? Thyroid disease Daughter   ? Hypertension Daughter   ? Diabetes Daughter   ? Hyperlipidemia Daughter   ? Fibromyalgia Daughter   ? Heart disease Daughter   ?     stents  ? Multiple sclerosis Daughter   ? Thyroid disease Daughter   ? ?Social History  ? ?Socioeconomic History  ? Marital status: Married  ?  Spouse name: [name]  ? Number of children: 3  ? Years of education: 16  ? Highest education level: Some college, no degree  ?Occupational History  ? Occupation: Health and safety inspector   retired  ? Occupation: pastor  ?Tobacco Use  ? Smoking status: Former  ?  Types: Cigarettes  ?  Quit date: 15  ?  Years since quitting: 63.4  ? Smokeless tobacco: Never  ? Tobacco  comments:  ?  2-3 years as a teenager  ?Vaping Use  ? Vaping Use: Never used  ?Substance and Sexual Activity  ? Alcohol use: No  ? Drug use: No  ? Sexual activity: Not Currently  ?Other Topics Concern  ? Not on file  ?Social History Narrative  ? Lives with with wife [name]  ? Married 60 years  ? Daughter [name] stays frequently  ? Granddaughter [name] near by  ? ?Social Determinants of Health  ? ?Financial  Resource Strain: Low Risk   ? Difficulty of Paying Living Expenses: Not hard at all  ?Food Insecurity: No Food Insecurity  ? Worried About Programme researcher, broadcasting/film/video in the Last Year: Never true  ? Ran Out of Food in the Last Year: Never true  ?Transportation Needs: No Transportation Needs  ? Lack of Transportation (Medical): No  ? Lack of Transportation (Non-Medical): No  ?Physical Activity: Sufficiently Active  ? Days of Exercise per Week: 5 days  ? Minutes of Exercise per Session: 30 min  ?Stress: No Stress Concern Present  ? Feeling of Stress : Not at all  ?Social Connections: Socially Integrated  ? Frequency of Communication with Friends and Family: More than three times a week  ? Frequency of Social Gatherings with Friends and Family: More than three times a week  ? Attends Religious Services: More than 4 times per year  ? Active Member of Clubs or Organizations: Yes  ? Attends Banker Meetings: More than 4 times per year  ? Marital Status: Married  ? ? ?Tobacco Counseling ?Counseling given: Not Answered ?Tobacco comments: 2-3 years as a teenager ? ? ?Clinical Intake: ? ?Pre-visit preparation completed: Yes ? ?Pain : No/denies pain ? ?  ? ?BMI - recorded: 23.63 ?Nutritional Status: BMI of 19-24  Normal ?Nutritional Risks: None ?Diabetes: Yes ? ?How often do you need to have someone help you when you read instructions, pamphlets, or other written materials from your doctor or pharmacy?: 1 - Never ? ?Diabetic?Nutrition Risk Assessment: ? ?Has the patient had any N/V/D within the last 2 months?  No   ?Does the patient have any non-healing wounds?  No  ?Has the patient had any unintentional weight loss or weight gain?  No  ? ?Diabetes: ? ?Is the patient diabetic?  Yes  ?If diabetic, was a CBG obtained to

## 2021-06-17 ENCOUNTER — Ambulatory Visit (INDEPENDENT_AMBULATORY_CARE_PROVIDER_SITE_OTHER): Payer: Medicare Other

## 2021-06-17 DIAGNOSIS — I5022 Chronic systolic (congestive) heart failure: Secondary | ICD-10-CM

## 2021-06-17 DIAGNOSIS — Z95810 Presence of automatic (implantable) cardiac defibrillator: Secondary | ICD-10-CM

## 2021-06-17 NOTE — Progress Notes (Signed)
EPIC Encounter for ICM Monitoring  Patient Name: Omar Williams is a 86 y.o. male Date: 06/17/2021 Primary Care Physican: Gwenlyn Fudge, FNP Primary Cardiologist:  Wyline Mood Electrophysiologist: [facility] Nephrologist: [facility] Bi-V Pacing: 99.2% 05/05/2021 Weight: 158 lbs 06/10/2021 Weight: 160 lbs   Spoke with patient and heart failure questions reviewed.  Pt is feeling well.   Pt eating foods high in salt at home as well as eating restaurant foods 1-2 times a week.  He ate chinese food over the weekend which may contribute to fluid returning.  Discussed correlation between high salt diet and fluid retention.     Optivol Thoracic impedance suggesting fluid returned to normal 5/10 but possible fluid accumulation returned 5/13.   Prescribed:  Furosemide 20 mg take 1 tablet as needed for swelling.  Pt reports taking Furosemide daily instead of PRN.   Labs: 04/15/2021 Creatinine 1.41, BUN 22, Potassium 4.5, Sodium 144, GFR 49 A complete set of results can be found in Results Review.   Recommendations:  Discussed limiting salt and fluid intake.  Pt is asking if Lasix should be increased and confirmed he is currently taking 20 mg daily instead of PRN.   Follow-up plan: ICM clinic phone appointment on 06/24/2021 (manual) to recheck fluid levels.   91 day device clinic remote transmission 07/09/2021.       EP/Cardiology Office Visits: 11/19/2021 with Dr Wyline Mood.  07/15/2021 with Dr Ladona Ridgel       Copy of ICM check sent to Dr. Johney Frame and Dr Wyline Mood for review and recommendations if needed.     3 month ICM trend: 06/17/2021.    12-14 Month ICM trend:     Karie Soda, RN 06/17/2021 10:03 AM

## 2021-06-20 ENCOUNTER — Other Ambulatory Visit: Payer: Self-pay

## 2021-06-20 MED ORDER — FUROSEMIDE 20 MG PO TABS: ORAL_TABLET | ORAL | 1 refills | Status: DC

## 2021-06-26 NOTE — Progress Notes (Signed)
No ICM remote transmission received for 06/24/2021 and next ICM transmission scheduled for 07/10/2021.

## 2021-07-03 DIAGNOSIS — R809 Proteinuria, unspecified: Secondary | ICD-10-CM | POA: Diagnosis not present

## 2021-07-03 DIAGNOSIS — E1129 Type 2 diabetes mellitus with other diabetic kidney complication: Secondary | ICD-10-CM | POA: Diagnosis not present

## 2021-07-03 DIAGNOSIS — I9589 Other hypotension: Secondary | ICD-10-CM | POA: Diagnosis not present

## 2021-07-03 DIAGNOSIS — E1122 Type 2 diabetes mellitus with diabetic chronic kidney disease: Secondary | ICD-10-CM | POA: Diagnosis not present

## 2021-07-03 DIAGNOSIS — E875 Hyperkalemia: Secondary | ICD-10-CM | POA: Diagnosis not present

## 2021-07-03 DIAGNOSIS — N189 Chronic kidney disease, unspecified: Secondary | ICD-10-CM | POA: Diagnosis not present

## 2021-07-04 ENCOUNTER — Other Ambulatory Visit: Payer: Self-pay | Admitting: Family Medicine

## 2021-07-04 DIAGNOSIS — E119 Type 2 diabetes mellitus without complications: Secondary | ICD-10-CM

## 2021-07-04 NOTE — Telephone Encounter (Signed)
Pharmacy comment: Alternative Requested:IF PATIENT IS NOT ON INSULIN, INSURANCE WILL ONLY ALLOW ONE TEST STRIP PER DAY.  D/T MCR guidelines chngd to QD

## 2021-07-09 ENCOUNTER — Ambulatory Visit (INDEPENDENT_AMBULATORY_CARE_PROVIDER_SITE_OTHER): Payer: Medicare Other

## 2021-07-09 ENCOUNTER — Telehealth: Payer: Self-pay

## 2021-07-09 DIAGNOSIS — I255 Ischemic cardiomyopathy: Secondary | ICD-10-CM | POA: Diagnosis not present

## 2021-07-09 NOTE — Telephone Encounter (Signed)
Returned call to patient as requested.  He requested ICM remote transmission results.  Transmission reviewed (see 6/8 ICM note) and discussed at length low salt diet.  Patient eats mostly foods high in salt and frequently eats restaurant foods.  Encouraged to avoid restaurant foods and read food labels for salt content.  Advised next ICM remote transmission 08/11/2021.

## 2021-07-10 ENCOUNTER — Other Ambulatory Visit: Payer: Self-pay | Admitting: Cardiology

## 2021-07-10 ENCOUNTER — Ambulatory Visit (INDEPENDENT_AMBULATORY_CARE_PROVIDER_SITE_OTHER): Payer: Medicare Other

## 2021-07-10 DIAGNOSIS — I129 Hypertensive chronic kidney disease with stage 1 through stage 4 chronic kidney disease, or unspecified chronic kidney disease: Secondary | ICD-10-CM | POA: Diagnosis not present

## 2021-07-10 DIAGNOSIS — I5022 Chronic systolic (congestive) heart failure: Secondary | ICD-10-CM

## 2021-07-10 DIAGNOSIS — I9589 Other hypotension: Secondary | ICD-10-CM | POA: Diagnosis not present

## 2021-07-10 DIAGNOSIS — R809 Proteinuria, unspecified: Secondary | ICD-10-CM | POA: Diagnosis not present

## 2021-07-10 DIAGNOSIS — E1129 Type 2 diabetes mellitus with other diabetic kidney complication: Secondary | ICD-10-CM | POA: Diagnosis not present

## 2021-07-10 DIAGNOSIS — E1122 Type 2 diabetes mellitus with diabetic chronic kidney disease: Secondary | ICD-10-CM | POA: Diagnosis not present

## 2021-07-10 DIAGNOSIS — I5032 Chronic diastolic (congestive) heart failure: Secondary | ICD-10-CM | POA: Diagnosis not present

## 2021-07-10 DIAGNOSIS — Z95810 Presence of automatic (implantable) cardiac defibrillator: Secondary | ICD-10-CM

## 2021-07-10 DIAGNOSIS — Z8679 Personal history of other diseases of the circulatory system: Secondary | ICD-10-CM | POA: Diagnosis not present

## 2021-07-10 DIAGNOSIS — N189 Chronic kidney disease, unspecified: Secondary | ICD-10-CM | POA: Diagnosis not present

## 2021-07-10 LAB — CUP PACEART REMOTE DEVICE CHECK
Battery Remaining Longevity: 31 mo
Battery Voltage: 2.95 V
Brady Statistic AP VP Percent: 50.83 %
Brady Statistic AP VS Percent: 0.04 %
Brady Statistic AS VP Percent: 48.59 %
Brady Statistic AS VS Percent: 0.55 %
Brady Statistic RA Percent Paced: 50.77 %
Brady Statistic RV Percent Paced: 96.07 %
Date Time Interrogation Session: 20230607033623
HighPow Impedance: 78 Ohm
Implantable Lead Implant Date: 20181130
Implantable Lead Implant Date: 20181130
Implantable Lead Implant Date: 20181130
Implantable Lead Location: 753858
Implantable Lead Location: 753859
Implantable Lead Location: 753860
Implantable Lead Model: 4598
Implantable Lead Model: 5076
Implantable Lead Model: 6935
Implantable Pulse Generator Implant Date: 20181130
Lead Channel Impedance Value: 141.867
Lead Channel Impedance Value: 145.871
Lead Channel Impedance Value: 156.606
Lead Channel Impedance Value: 160.941
Lead Channel Impedance Value: 166.114
Lead Channel Impedance Value: 266 Ohm
Lead Channel Impedance Value: 304 Ohm
Lead Channel Impedance Value: 323 Ohm
Lead Channel Impedance Value: 342 Ohm
Lead Channel Impedance Value: 380 Ohm
Lead Channel Impedance Value: 380 Ohm
Lead Channel Impedance Value: 456 Ohm
Lead Channel Impedance Value: 494 Ohm
Lead Channel Impedance Value: 513 Ohm
Lead Channel Impedance Value: 532 Ohm
Lead Channel Impedance Value: 532 Ohm
Lead Channel Impedance Value: 570 Ohm
Lead Channel Impedance Value: 589 Ohm
Lead Channel Pacing Threshold Amplitude: 0.375 V
Lead Channel Pacing Threshold Amplitude: 0.75 V
Lead Channel Pacing Threshold Amplitude: 1.5 V
Lead Channel Pacing Threshold Pulse Width: 0.4 ms
Lead Channel Pacing Threshold Pulse Width: 0.4 ms
Lead Channel Pacing Threshold Pulse Width: 0.4 ms
Lead Channel Sensing Intrinsic Amplitude: 2.875 mV
Lead Channel Sensing Intrinsic Amplitude: 2.875 mV
Lead Channel Sensing Intrinsic Amplitude: 9.25 mV
Lead Channel Sensing Intrinsic Amplitude: 9.25 mV
Lead Channel Setting Pacing Amplitude: 2 V
Lead Channel Setting Pacing Amplitude: 2 V
Lead Channel Setting Pacing Amplitude: 2.5 V
Lead Channel Setting Pacing Pulse Width: 0.4 ms
Lead Channel Setting Pacing Pulse Width: 0.4 ms
Lead Channel Setting Sensing Sensitivity: 0.3 mV

## 2021-07-10 NOTE — Progress Notes (Signed)
EPIC Encounter for ICM Monitoring  Patient Name: Omar Williams is a 86 y.o. male Date: 07/10/2021 Primary Care Physican: Loman Brooklyn, FNP Primary Cardiologist:  Harl Bowie Electrophysiologist: Allred Nephrologist: [place] Bi-V Pacing: 99.1% 05/05/2021 Weight: 158 lbs 06/10/2021 Weight: 160 lbs   Spoke with patient 6/7 and heart failure questions reviewed.  Pt is feeling well.   Pt eating primarily foods high in salt and frequently eats restaurant foods.  Explained importance of low salt diet.     Optivol Thoracic impedance trending just below baseline suggesting mild fluid accumulation in past week.     Prescribed:  Furosemide 20 mg take 1 tablet as needed for swelling.  Pt reports 6/8 taking Furosemide daily instead of PRN.   Labs: 04/15/2021 Creatinine 1.41, BUN 22, Potassium 4.5, Sodium 144, GFR 49 A complete set of results can be found in Results Review.   Recommendations:  Recommendation to limit salt intake to 2000 mg daily and fluid intake to 64 oz daily.  Encouraged to call if experiencing any fluid symptoms.    Follow-up plan: ICM clinic phone appointment on 08/11/2021.   91 day device clinic remote transmission 10/08/2021.       EP/Cardiology Office Visits: 11/19/2021 with Dr Harl Bowie.  07/15/2021 with Dr Lovena Le       Copy of ICM check sent to Dr. Rayann Heman   3 month ICM trend: 07/09/2021.    12-14 Month ICM trend:     Rosalene Billings, RN 07/10/2021 8:39 AM

## 2021-07-14 ENCOUNTER — Telehealth: Payer: Self-pay | Admitting: Family Medicine

## 2021-07-14 ENCOUNTER — Other Ambulatory Visit: Payer: Self-pay | Admitting: Nurse Practitioner

## 2021-07-14 NOTE — Telephone Encounter (Signed)
He can go back to taking his glipizide to maintain his blood sugar for 3 days before his next appointment with PCP.  If he needs refill to cover for a few weeks to months I can send that in if he has none left.

## 2021-07-15 ENCOUNTER — Ambulatory Visit (INDEPENDENT_AMBULATORY_CARE_PROVIDER_SITE_OTHER): Payer: Medicare Other | Admitting: Internal Medicine

## 2021-07-15 VITALS — BP 108/64 | HR 74 | Ht 67.0 in | Wt 163.2 lb

## 2021-07-15 DIAGNOSIS — I10 Essential (primary) hypertension: Secondary | ICD-10-CM

## 2021-07-15 DIAGNOSIS — I255 Ischemic cardiomyopathy: Secondary | ICD-10-CM | POA: Diagnosis not present

## 2021-07-15 NOTE — Patient Instructions (Signed)
Medication Instructions:  Your physician recommends that you continue on your current medications as directed. Please refer to the Current Medication list given to you today.  *If you need a refill on your cardiac medications before your next appointment, please call your pharmacy*   Lab Work: NONE   If you have labs (blood work) drawn today and your tests are completely normal, you will receive your results only by: MyChart Message (if you have MyChart) OR A paper copy in the mail If you have any lab test that is abnormal or we need to change your treatment, we will call you to review the results.   Testing/Procedures: NONE    Follow-Up: At [facility], you and your health needs are our priority.  As part of our continuing mission to provide you with exceptional heart care, we have created designated Provider Care Teams.  These Care Teams include your primary Cardiologist (physician) and Advanced Practice Providers (APPs -  Physician Assistants and Nurse Practitioners) who all work together to provide you with the care you need, when you need it.  We recommend signing up for the patient portal called "MyChart".  Sign up information is provided on this After Visit Summary.  MyChart is used to connect with patients for Virtual Visits (Telemedicine).  Patients are able to view lab/test results, encounter notes, upcoming appointments, etc.  Non-urgent messages can be sent to your provider as well.   To learn more about what you can do with MyChart, go to [email or web address].    Your next appointment:   1 year(s)  The format for your next appointment:   In Person  Provider:   Gregg Taylor, MD    Other Instructions Thank you for choosing [facility]!    Important Information About Sugar       

## 2021-07-15 NOTE — Progress Notes (Signed)
HPI [name] returns today for followup. He is a patient of Dr. Bonita Williams who has a h/o VF arrest, ICM, s/p PCI. He has done well. He denies chest pain or sob. No edema. He has tried to eat less salt. He denies any ICD therapies. No syncope.   No Known Allergies   Current Outpatient Medications  Medication Sig Dispense Refill   aspirin EC 81 MG tablet Take 81 mg by mouth daily.     atorvastatin (LIPITOR) 80 MG tablet TAKE 1 TABLET BY MOUTH EVERYDAY AT BEDTIME 90 tablet 1   Bee Pollen 550 MG CAPS Take 2 capsules by mouth 2 (two) times daily.     Cholecalciferol (VITAMIN D3) 125 MCG (5000 UT) TABS Take 1 tablet by mouth daily.      CINNAMON PO Take 1,000 mg by mouth in the morning and at bedtime.     docusate sodium (COLACE) 50 MG capsule Take 100 mg by mouth 2 (two) times daily.     empagliflozin (JARDIANCE) 25 MG TABS tablet Take 1 tablet (25 mg total) by mouth daily. 90 tablet 1   ezetimibe (ZETIA) 10 MG tablet TAKE 1 TABLET BY MOUTH EVERY DAY 90 tablet 1   FIBER ADULT GUMMIES PO Take 2 tablets by mouth at bedtime.     finasteride (PROSCAR) 5 MG tablet Take 1 tablet (5 mg total) by mouth at bedtime. 90 tablet 3   furosemide (LASIX) 20 MG tablet TAKE 1 TABLET BY MOUTH DAILY AS NEEDED FOR SWELLING 90 tablet 1   glimepiride (AMARYL) 4 MG tablet TAKE 1 TABLET (4 MG TOTAL) BY MOUTH IN THE MORNING AND AT BEDTIME. 180 tablet 0   glucose blood (ONETOUCH ULTRA) test strip Test BS daily Dx E11.9 100 strip 3   lisinopril (ZESTRIL) 2.5 MG tablet TAKE 1 TABLET BY MOUTH EVERY DAY 90 tablet 0   nitroGLYCERIN (NITROSTAT) 0.4 MG SL tablet Place 1 tablet (0.4 mg total) under the tongue every 5 (five) minutes x 3 doses as needed for chest pain. 25 tablet 3   Omega-3 Fatty Acids (FISH OIL) 1200 MG CAPS Take 1 capsule by mouth 2 (two) times daily.      oxybutynin (DITROPAN) 5 MG tablet Take 0.5 tablets (2.5 mg total) by mouth 3 (three) times daily. 135 tablet 3   pantoprazole (PROTONIX) 40 MG tablet  Take by mouth.     tamsulosin (FLOMAX) 0.4 MG CAPS capsule Take 1 capsule (0.4 mg total) by mouth every morning. 90 capsule 3   TURMERIC PO Take 1,000 mg by mouth 2 (two) times daily.     vitamin B-12 (CYANOCOBALAMIN) 1000 MCG tablet Take 1,000 mcg by mouth every morning.      vitamin C (ASCORBIC ACID) 500 MG tablet Take 1,000 mg by mouth 2 (two) times daily.     chlorhexidine (PERIDEX) 0.12 % solution as needed. (Patient not taking: Reported on 07/15/2021)     No current facility-administered medications for this visit.     Past Medical History:  Diagnosis Date   Acid reflux    Blood transfusion without reported diagnosis    Cardiac arrest ([place])    a. occuring in 12/2016. LAD disease but no targets for CABG or PCI. Underwent ICD placement as EF 15-20%.    CHF (congestive heart failure) (HCC)    Coronary artery disease    Diabetes mellitus without complication (HCC)    Diabetic nephropathy (HCC)    Hyperlipidemia    Hypertension  Myocardial infarction [facility])    Renal disorder     ROS:   All systems reviewed and negative except as noted in the HPI.   Past Surgical History:  Procedure Laterality Date   BIV ICD INSERTION CRT-D N/A 01/01/2017   Procedure: BIV ICD INSERTION CRT-D;  Surgeon: Constance Haw, MD;  Location: [place] CV LAB;  Service: Cardiovascular;  Laterality: N/A;     Family History  Problem Relation Age of Onset   Hypertension Mother    Hyperlipidemia Mother    CAD Sister    Diabetes Sister    Hyperlipidemia Sister    Hypertension Sister    CAD Brother    Diabetes Brother    Hypertension Brother    Hyperlipidemia Brother    CAD Brother    Diabetes Brother    Hypertension Brother    Hyperlipidemia Brother    Dementia Brother    Thyroid disease Daughter    Hypertension Daughter    Diabetes Daughter    Hyperlipidemia Daughter    Fibromyalgia Daughter    Heart disease Daughter        stents   Multiple sclerosis Daughter    Thyroid  disease Daughter      Social History   Socioeconomic History   Marital status: Married    Spouse name: Stage manager   Number of children: 3   Years of education: 12   Highest education level: Some college, no degree  Occupational History   Occupation: Radiographer, therapeutic   retired   Occupation: Theme park manager  Tobacco Use   Smoking status: Former    Types: Cigarettes    Quit date: 1960    Years since quitting: 63.4   Smokeless tobacco: Never   Tobacco comments:    2-3 years as a teenager  Scientific laboratory technician Use: Never used  Substance and Sexual Activity   Alcohol use: No   Drug use: No   Sexual activity: Not Currently  Other Topics Concern   Not on file  Social History Narrative   Lives with with wife Omar Williams   Married 57 years   Daughter Omar Williams stays frequently   Granddaughter Omar Williams near by   Social Determinants of Health   Financial Resource Strain: Low Risk  (06/16/2021)   Overall Financial Resource Strain (CARDIA)    Difficulty of Paying Living Expenses: Not hard at all  Food Insecurity: No Food Insecurity (06/16/2021)   Hunger Vital Sign    Worried About Running Out of Food in the Last Year: Never true    [place] in the Last Year: Never true  Transportation Needs: No Transportation Needs (06/16/2021)   PRAPARE - Hydrologist (Medical): No    Lack of Transportation (Non-Medical): No  Physical Activity: Sufficiently Active (06/16/2021)   Exercise Vital Sign    Days of Exercise per Week: 5 days    Minutes of Exercise per Session: 30 min  Stress: No Stress Concern Present (06/16/2021)   [place]    Feeling of Stress : Not at all  Social Connections: [place] (06/16/2021)   Social Connection and Isolation Panel [NHANES]    Frequency of Communication with Friends and Family: More than three times a week    Frequency of Social Gatherings with Friends and Family:  More than three times a week    Attends Religious Services: More than 4 times per year    Active  Member of Clubs or Organizations: Yes    Attends Music therapist: More than 4 times per year    Marital Status: Married  Human resources officer Violence: Not At Risk (06/16/2021)   Humiliation, Afraid, Rape, and Kick questionnaire    Fear of Current or Ex-Partner: No    Emotionally Abused: No    Physically Abused: No    Sexually Abused: No     BP 108/64   Pulse 74   Ht 5\' 7"  (1.702 m)   Wt 163 lb 3.2 oz (74 kg)   SpO2 96%   BMI 25.56 kg/m   Physical Exam:  Well appearing NAD HEENT: Unremarkable Neck:  No JVD, no thyromegally Lymphatics:  No adenopathy Back:  No CVA tenderness Lungs:  Clear HEART:  Regular rate rhythm, no murmurs, no rubs, no clicks Abd:  soft, positive bowel sounds, no organomegally, no rebound, no guarding Ext:  2 plus pulses, no edema, no cyanosis, no clubbing Skin:  No rashes no nodules Neuro:  CN II through XII intact, motor grossly intact  EKG - nsr with biv pacing  DEVICE  Normal device function.  See PaceArt for details.   Assess/Plan:  Chronic systolic heart failure - his symptoms are class 2. He will continue GDMT. He admits to dietary indiscretion in the past and I encouraged him to avoid salty food. 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 Dyslipidemia - continue statin therapy and zetia. ICD - his medtronic BIV ICD is working normally. We will recheck in several months. Carleene Overlie Mara Favero,MD

## 2021-07-17 ENCOUNTER — Ambulatory Visit (INDEPENDENT_AMBULATORY_CARE_PROVIDER_SITE_OTHER): Payer: Medicare Other | Admitting: Family Medicine

## 2021-07-17 ENCOUNTER — Encounter: Payer: Self-pay | Admitting: Family Medicine

## 2021-07-17 ENCOUNTER — Telehealth: Payer: Self-pay

## 2021-07-17 VITALS — BP 127/76 | HR 62 | Temp 97.6°F | Ht 67.0 in | Wt 161.4 lb

## 2021-07-17 DIAGNOSIS — E1121 Type 2 diabetes mellitus with diabetic nephropathy: Secondary | ICD-10-CM | POA: Diagnosis not present

## 2021-07-17 DIAGNOSIS — Z23 Encounter for immunization: Secondary | ICD-10-CM | POA: Diagnosis not present

## 2021-07-17 DIAGNOSIS — E1122 Type 2 diabetes mellitus with diabetic chronic kidney disease: Secondary | ICD-10-CM | POA: Diagnosis not present

## 2021-07-17 DIAGNOSIS — I5022 Chronic systolic (congestive) heart failure: Secondary | ICD-10-CM

## 2021-07-17 DIAGNOSIS — E559 Vitamin D deficiency, unspecified: Secondary | ICD-10-CM | POA: Diagnosis not present

## 2021-07-17 DIAGNOSIS — I252 Old myocardial infarction: Secondary | ICD-10-CM | POA: Diagnosis not present

## 2021-07-17 DIAGNOSIS — Z596 Low income: Secondary | ICD-10-CM

## 2021-07-17 DIAGNOSIS — I10 Essential (primary) hypertension: Secondary | ICD-10-CM

## 2021-07-17 DIAGNOSIS — N1832 Chronic kidney disease, stage 3b: Secondary | ICD-10-CM | POA: Diagnosis not present

## 2021-07-17 DIAGNOSIS — Z95810 Presence of automatic (implantable) cardiac defibrillator: Secondary | ICD-10-CM

## 2021-07-17 DIAGNOSIS — E785 Hyperlipidemia, unspecified: Secondary | ICD-10-CM

## 2021-07-17 DIAGNOSIS — I25118 Atherosclerotic heart disease of native coronary artery with other forms of angina pectoris: Secondary | ICD-10-CM | POA: Diagnosis not present

## 2021-07-17 LAB — BAYER DCA HB A1C WAIVED: HB A1C (BAYER DCA - WAIVED): 7.5 % — ABNORMAL HIGH (ref 4.8–5.6)

## 2021-07-17 MED ORDER — DAPAGLIFLOZIN PROPANEDIOL 10 MG PO TABS: 10.0000 mg | ORAL_TABLET | Freq: Every day | ORAL | 0 refills | Status: DC

## 2021-07-17 NOTE — Chronic Care Management (AMB) (Signed)
  Chronic Care Management   Note  07/17/2021 Name: Omar Williams MRN: [id number] DOB: 09/24/1935  Omar Williams is a 86 y.o. year old male who is a primary care patient of Loman Brooklyn, FNP. I reached out to Arna Snipe by phone today in response to a referral sent by Omar Williams's PCP.  Omar Williams was given information about Chronic Care Management services today including:  CCM service includes personalized support from designated clinical staff supervised by his physician, including individualized plan of care and coordination with other care providers 24/7 contact phone numbers for assistance for urgent and routine care needs. Service will only be billed when office clinical staff spend 20 minutes or more in a month to coordinate care. Only one practitioner may furnish and bill the service in a calendar month. The patient may stop CCM services at any time (effective at the end of the month) by phone call to the office staff. The patient is responsible for co-pay (up to 20% after annual deductible is met) if co-pay is required by the individual health plan.   Patient agreed to services and verbal consent obtained.   Follow up plan: Telephone appointment with care management team member scheduled for:08/19/2021  Noreene Larsson, [address], [place], [place] [address] Direct Dial: [phone] [email or web address]: [email or web address]

## 2021-07-17 NOTE — Progress Notes (Signed)
Assessment & Plan:  1. Type 2 diabetes mellitus with stage 3b chronic kidney disease, without long-term current use of insulin (HCC) Lab Results  Component Value Date   HGBA1C 7.6 (H) 04/15/2021   HGBA1C 6.7 (H) 01/14/2021   HGBA1C 7.0 (H) 08/20/2020  A1c 7.5 today which decreased slightly from 7.6 three months ago. - Diabetes is at goal of A1c in the 7s given is age. - Medications: continue current medications. Farxiga 10 mg samples provided to start when he runs out of Jardiance given it's cost. We will get him scheduled to see our clinical pharmacist for prescription assistance for [place] glucose monitoring: continue monitoring. - Patient is currently taking a statin. Patient is taking an ACE-inhibitor/ARB.   Diabetes Health Maintenance Due  Topic Date Due   FOOT EXAM  09/12/2021   HEMOGLOBIN A1C  10/16/2021   OPHTHALMOLOGY EXAM  11/06/2021    Lab Results  Component Value Date   LABMICR 134.1 09/12/2020   LABMICR See below: 08/15/2020   MICROALBUR 14.6 08/08/2019   MICROALBUR 17.8 04/17/2019   - Lipid panel - CBC with Differential/Platelet - CMP14+EGFR - Bayer DCA Hb A1c Waived - AMB Referral to [place] - dapagliflozin propanediol (FARXIGA) 10 MG TABS tablet; Take 1 tablet (10 mg total) by mouth daily before breakfast.  Dispense: 21 tablet; Refill: 0  2. Diabetic nephropathy associated with type 2 diabetes mellitus ([place]) Diabetes controlled for age.  - CMP14+EGFR  3. Essential hypertension Well controlled on current regimen.  - Lipid panel - CBC with Differential/Platelet - CMP14+EGFR  4. Stage 3b chronic kidney disease ([place]) Managed by nephrology. Referring to our clinical pharmacist for prescription assistance for Farxiga as he is unable to afford Jardiance. - CMP14+EGFR - AMB Referral to [place] - dapagliflozin propanediol (FARXIGA) 10 MG TABS tablet; Take 1 tablet (10 mg total) by mouth daily before breakfast.   Dispense: 21 tablet; Refill: 0  5. Vitamin D deficiency Labs to assess. - VITAMIN D 25 Hydroxy (Vit-D Deficiency, Fractures)  6. Hyperlipidemia LDL goal <70 Well controlled on current regimen.  - Lipid panel - CBC with Differential/Platelet - CMP14+EGFR  7. Coronary artery disease of native artery of native heart with stable angina pectoris ([place]) Continue current regimen. Managed by cardiology. - Lipid panel - CBC with Differential/Platelet - CMP14+EGFR  8. History of non-ST elevation myocardial infarction (NSTEMI) Continue current regimen. Managed by cardiology. - Lipid panel - CBC with Differential/Platelet - CMP14+EGFR  9. Chronic systolic heart failure (HCC) Continue current regimen. Managed by cardiology. Referring to our clinical pharmacist for prescription assistance for Farxiga as he is unable to afford Jardiance. - Lipid panel - CBC with Differential/Platelet - CMP14+EGFR - dapagliflozin propanediol (FARXIGA) 10 MG TABS tablet; Take 1 tablet (10 mg total) by mouth daily before breakfast.  Dispense: 21 tablet; Refill: 0  10. Presence of implantable cardioverter-defibrillator (ICD) Managed by cardiology.   11. Immunization due - Varicella-zoster vaccine IM (Shingrix)  12. Patient cannot afford medications - AMB Referral to [place] for assistance with [place].    Return in about 3 months (around 10/17/2021) for follow-up of chronic medication conditions.  Hendricks Limes, MSN, APRN, FNP-C [facility]  Subjective:    Patient ID: Omar Williams, male    DOB: 1935-05-07, 86 y.o.   MRN: [id number]  Patient Care Team: Loman Brooklyn, FNP as PCP - General (Family Medicine) Thompson Grayer, MD as PCP - Electrophysiology (Cardiology) Arnoldo Lenis, MD as  PCP - Cardiology (Cardiology) Shirl Harris, OD (Optometry) Verta Ellen., NP as Nurse Practitioner (Cardiology) Liana Gerold, MD as Consulting Physician  (Nephrology) Irine Seal, MD as Attending Physician (Urology) Aviva Signs, MD as Consulting Physician (General Surgery)   Chief Complaint:  Chief Complaint  Patient presents with   Medical Management of Chronic Issues    HPI: Omar Williams is a 86 y.o. male presenting on 07/17/2021 for Medical Management of Chronic Issues  Diabetes: Patient presents for follow up of diabetes. Current symptoms include: hyperglycemia. Known diabetic complications: nephropathy and cardiovascular disease. Medication compliance: taking Jardiance 25 mg daily. Current diet: in general, a "healthy" diet  . Current exercise: walking and weightlifting. Home blood sugar records: BGs are running  consistent with Hgb A1C. Is he  on ACE inhibitor or angiotensin II receptor blocker? Yes (Lisinopril). Is he on a statin? Yes (Atorvastatin).   CKD with vitamin D deficiency: Managed by Dr. Theador Hawthorne, nephrologist, who he last saw on 07/10/2021.  He was advised at that time to decrease vitamin D 5,000 units from daily to weekly.  Started on Farxiga by me at our last visit to help with CKD and diabetes. Insurance did not cover and it was changed to [place], which he reports today he is unable to afford as it cost him ~$400 for a 47-monthsupply.   CHF/history of V-Fib/hypertension/CAD/hyperlipidemia: Patient saw his electrophysiologist on 01/07/2021 and his general cardiologist on 07/15/2021.   New complaints: None   Social history:  Relevant past medical, surgical, family and social history reviewed and updated as indicated. Interim medical history since our last visit reviewed.  Allergies and medications reviewed and updated.  DATA REVIEWED: CHART IN EPIC  ROS: Negative unless specifically indicated above in HPI.    Current Outpatient Medications:    aspirin EC 81 MG tablet, Take 81 mg by mouth daily., Disp: , Rfl:    atorvastatin (LIPITOR) 80 MG tablet, TAKE 1 TABLET BY MOUTH EVERYDAY AT BEDTIME, Disp: 90 tablet, Rfl: 1    Bee Pollen 550 MG CAPS, Take 2 capsules by mouth 2 (two) times daily., Disp: , Rfl:    chlorhexidine (PERIDEX) 0.12 % solution, as needed., Disp: , Rfl:    Cholecalciferol (VITAMIN D3) 125 MCG (5000 UT) TABS, Take 1 tablet by mouth daily. , Disp: , Rfl:    CINNAMON PO, Take 1,000 mg by mouth in the morning and at bedtime., Disp: , Rfl:    docusate sodium (COLACE) 50 MG capsule, Take 100 mg by mouth 2 (two) times daily., Disp: , Rfl:    empagliflozin (JARDIANCE) 25 MG TABS tablet, Take 1 tablet (25 mg total) by mouth daily., Disp: 90 tablet, Rfl: 1   ezetimibe (ZETIA) 10 MG tablet, TAKE 1 TABLET BY MOUTH EVERY DAY, Disp: 90 tablet, Rfl: 1   FIBER ADULT GUMMIES PO, Take 2 tablets by mouth at bedtime., Disp: , Rfl:    finasteride (PROSCAR) 5 MG tablet, Take 1 tablet (5 mg total) by mouth at bedtime., Disp: 90 tablet, Rfl: 3   furosemide (LASIX) 20 MG tablet, TAKE 1 TABLET BY MOUTH DAILY AS NEEDED FOR SWELLING, Disp: 90 tablet, Rfl: 1   glimepiride (AMARYL) 4 MG tablet, TAKE 1 TABLET (4 MG TOTAL) BY MOUTH IN THE MORNING AND AT BEDTIME., Disp: 180 tablet, Rfl: 0   glucose blood (ONETOUCH ULTRA) test strip, Test BS daily Dx E11.9, Disp: 100 strip, Rfl: 3   lisinopril (ZESTRIL) 2.5 MG tablet, TAKE 1 TABLET BY MOUTH EVERY  DAY, Disp: 90 tablet, Rfl: 0   nitroGLYCERIN (NITROSTAT) 0.4 MG SL tablet, Place 1 tablet (0.4 mg total) under the tongue every 5 (five) minutes x 3 doses as needed for chest pain., Disp: 25 tablet, Rfl: 3   Omega-3 Fatty Acids (FISH OIL) 1200 MG CAPS, Take 1 capsule by mouth 2 (two) times daily. , Disp: , Rfl:    oxybutynin (DITROPAN) 5 MG tablet, Take 0.5 tablets (2.5 mg total) by mouth 3 (three) times daily., Disp: 135 tablet, Rfl: 3   pantoprazole (PROTONIX) 40 MG tablet, Take by mouth., Disp: , Rfl:    tamsulosin (FLOMAX) 0.4 MG CAPS capsule, Take 1 capsule (0.4 mg total) by mouth every morning., Disp: 90 capsule, Rfl: 3   TURMERIC PO, Take 1,000 mg by mouth 2 (two) times daily.,  Disp: , Rfl:    vitamin B-12 (CYANOCOBALAMIN) 1000 MCG tablet, Take 1,000 mcg by mouth every morning. , Disp: , Rfl:    vitamin C (ASCORBIC ACID) 500 MG tablet, Take 1,000 mg by mouth 2 (two) times daily., Disp: , Rfl:    No Known Allergies Past Medical History:  Diagnosis Date   Acid reflux    Blood transfusion without reported diagnosis    Cardiac arrest ([place])    a. occuring in 12/2016. LAD disease but no targets for CABG or PCI. Underwent ICD placement as EF 15-20%.    CHF (congestive heart failure) (HCC)    Coronary artery disease    Diabetes mellitus without complication ([place])    Diabetic nephropathy ([place])    Hyperlipidemia    Hypertension    Myocardial infarction [facility])    Renal disorder     Past Surgical History:  Procedure Laterality Date   BIV ICD INSERTION CRT-D N/A 01/01/2017   Procedure: BIV ICD INSERTION CRT-D;  Surgeon: Constance Haw, MD;  Location: [facility];  Service: Cardiovascular;  Laterality: N/A;    Social History   Socioeconomic History   Marital status: Married    Spouse name: johnna   Number of children: 3   Years of education: 12   Highest education level: Some college, no degree  Occupational History   Occupation: Radiographer, therapeutic   retired   Occupation: Theme park manager  Tobacco Use   Smoking status: Former    Types: Cigarettes    Quit date: 1960    Years since quitting: 63.4   Smokeless tobacco: Never   Tobacco comments:    2-3 years as a teenager  Scientific laboratory technician Use: Never used  Substance and Sexual Activity   Alcohol use: No   Drug use: No   Sexual activity: Not Currently  Other Topics Concern   Not on file  Social History Narrative   Lives with with wife [name]   Married 65 years   Daughter [name] stays frequently   Granddaughter Jacqlyn Larsen near by   Social Determinants of Health   Financial Resource Strain: Low Risk  (06/16/2021)   Overall Financial Resource Strain (CARDIA)    Difficulty of Paying Living Expenses:  Not hard at all  Food Insecurity: No Food Insecurity (06/16/2021)   Hunger Vital Sign    Worried About Running Out of Food in the Last Year: Never true    [place] in the Last Year: Never true  Transportation Needs: No Transportation Needs (06/16/2021)   PRAPARE - Hydrologist (Medical): No    Lack of Transportation (Non-Medical): No  Physical Activity:  Sufficiently Active (06/16/2021)   Exercise Vital Sign    Days of Exercise per Week: 5 days    Minutes of Exercise per Session: 30 min  Stress: No Stress Concern Present (06/16/2021)   [place]    Feeling of Stress : Not at all  Social Connections: [place] (06/16/2021)   Social Connection and Isolation Panel [NHANES]    Frequency of Communication with Friends and Family: More than three times a week    Frequency of Social Gatherings with Friends and Family: More than three times a week    Attends Religious Services: More than 4 times per year    Active Member of [employer] or Organizations: Yes    Attends Music therapist: More than 4 times per year    Marital Status: Married  Human resources officer Violence: Not At Risk (06/16/2021)   Humiliation, Afraid, Rape, and Kick questionnaire    Fear of Current or Ex-Partner: No    Emotionally Abused: No    Physically Abused: No    Sexually Abused: No        Objective:    BP 127/76   Pulse 62   Temp 97.6 F (36.4 C) (Temporal)   Ht 5' 7" (1.702 m)   Wt 161 lb 6.4 oz (73.2 kg)   SpO2 95%   BMI 25.28 kg/m   Wt Readings from Last 3 Encounters:  07/17/21 161 lb 6.4 oz (73.2 kg)  07/15/21 163 lb 3.2 oz (74 kg)  06/16/21 160 lb (72.6 kg)    Physical Exam Vitals reviewed.  Constitutional:      General: He is not in acute distress.    Appearance: Normal appearance. He is normal weight. He is not ill-appearing, toxic-appearing or diaphoretic.  HENT:     Head:  Normocephalic and atraumatic.  Eyes:     General: No scleral icterus.       Right eye: No discharge.        Left eye: No discharge.     Conjunctiva/sclera: Conjunctivae normal.  Cardiovascular:     Rate and Rhythm: Normal rate and regular rhythm.     Heart sounds: Normal heart sounds. No murmur heard.    No friction rub. No gallop.  Pulmonary:     Effort: Pulmonary effort is normal. No respiratory distress.     Breath sounds: Normal breath sounds. No stridor. No wheezing, rhonchi or rales.  Musculoskeletal:        General: Normal range of motion.     Cervical back: Normal range of motion.  Skin:    General: Skin is warm and dry.  Neurological:     Mental Status: He is alert and oriented to person, place, and time. Mental status is at baseline.  Psychiatric:        Mood and Affect: Mood normal.        Behavior: Behavior normal.        Thought Content: Thought content normal.        Judgment: Judgment normal.    No results found for: "TSH" Lab Results  Component Value Date   WBC 9.4 04/15/2021   HGB 14.5 04/15/2021   HCT 44.7 04/15/2021   MCV 96 04/15/2021   PLT 215 04/15/2021   Lab Results  Component Value Date   NA 144 04/15/2021   K 4.5 04/15/2021   CO2 25 04/15/2021   GLUCOSE 129 (H) 04/15/2021   BUN 22 04/15/2021   CREATININE 1.41 (  H) 04/15/2021   BILITOT 0.7 04/15/2021   ALKPHOS 80 04/15/2021   AST 22 04/15/2021   ALT 21 04/15/2021   PROT 6.6 04/15/2021   ALBUMIN 4.4 04/15/2021   CALCIUM 9.5 04/15/2021   ANIONGAP 12 01/25/2017   EGFR 49 (L) 04/15/2021   Lab Results  Component Value Date   CHOL 133 04/15/2021   Lab Results  Component Value Date   HDL 57 04/15/2021   Lab Results  Component Value Date   LDLCALC 61 04/15/2021   Lab Results  Component Value Date   TRIG 78 04/15/2021   Lab Results  Component Value Date   CHOLHDL 2.3 04/15/2021   Lab Results  Component Value Date   HGBA1C 7.6 (H) 04/15/2021

## 2021-07-17 NOTE — Chronic Care Management (AMB) (Signed)
  Chronic Care Management   Outreach Note  07/17/2021 Name: Omar Williams MRN: [id number] DOB: 1935/07/21  [name] is a 86 y.o. year old male who is a primary care patient of Gwenlyn Fudge, FNP. I reached out to Wynona Dove by phone today in response to a referral sent by [name] primary care provider.  An unsuccessful telephone outreach was attempted today. The patient was referred to the case management team for assistance with care management and care coordination.   Follow Up Plan: A HIPAA compliant phone message was left for the patient providing contact information and requesting a return call.  The care management team will reach out to the patient again over the next 7 days.  If patient returns call to provider office, please advise to call Embedded Care Management Care Guide Penne Lash * at [phone]*  Penne Lash, RMA Care Guide, Embedded Care Coordination [facility]  [place], [place] [address] Direct Dial: [phone] [email or web address]

## 2021-07-18 LAB — CBC WITH DIFFERENTIAL/PLATELET
Basophils Absolute: 0.1 10*3/uL (ref 0.0–0.2)
Basos: 1 %
EOS (ABSOLUTE): 0.6 10*3/uL — ABNORMAL HIGH (ref 0.0–0.4)
Eos: 7 %
Hematocrit: 48 % (ref 37.5–51.0)
Hemoglobin: 15.8 g/dL (ref 13.0–17.7)
Immature Grans (Abs): 0 10*3/uL (ref 0.0–0.1)
Immature Granulocytes: 0 %
Lymphocytes Absolute: 2.2 10*3/uL (ref 0.7–3.1)
Lymphs: 27 %
MCH: 31.3 pg (ref 26.6–33.0)
MCHC: 32.9 g/dL (ref 31.5–35.7)
MCV: 95 fL (ref 79–97)
Monocytes Absolute: 1.1 10*3/uL — ABNORMAL HIGH (ref 0.1–0.9)
Monocytes: 13 %
Neutrophils Absolute: 4.2 10*3/uL (ref 1.4–7.0)
Neutrophils: 52 %
Platelets: 229 10*3/uL (ref 150–450)
RBC: 5.05 x10E6/uL (ref 4.14–5.80)
RDW: 13.1 % (ref 11.6–15.4)
WBC: 8.3 10*3/uL (ref 3.4–10.8)

## 2021-07-18 LAB — CMP14+EGFR
ALT: 21 IU/L (ref 0–44)
AST: 19 IU/L (ref 0–40)
Albumin/Globulin Ratio: 1.8 (ref 1.2–2.2)
Albumin: 4.7 g/dL — ABNORMAL HIGH (ref 3.6–4.6)
Alkaline Phosphatase: 86 IU/L (ref 44–121)
BUN/Creatinine Ratio: 25 — ABNORMAL HIGH (ref 10–24)
BUN: 36 mg/dL — ABNORMAL HIGH (ref 8–27)
Bilirubin Total: 0.8 mg/dL (ref 0.0–1.2)
CO2: 23 mmol/L (ref 20–29)
Calcium: 9.9 mg/dL (ref 8.6–10.2)
Chloride: 102 mmol/L (ref 96–106)
Creatinine, Ser: 1.44 mg/dL — ABNORMAL HIGH (ref 0.76–1.27)
Globulin, Total: 2.6 g/dL (ref 1.5–4.5)
Glucose: 106 mg/dL — ABNORMAL HIGH (ref 70–99)
Potassium: 4.6 mmol/L (ref 3.5–5.2)
Sodium: 141 mmol/L (ref 134–144)
Total Protein: 7.3 g/dL (ref 6.0–8.5)
eGFR: 48 mL/min/{1.73_m2} — ABNORMAL LOW (ref >59)

## 2021-07-18 LAB — LIPID PANEL
Chol/HDL Ratio: 2.5 ratio (ref 0.0–5.0)
Cholesterol, Total: 151 mg/dL (ref 100–199)
HDL: 61 mg/dL (ref >39)
LDL Chol Calc (NIH): 74 mg/dL (ref 0–99)
Triglycerides: 86 mg/dL (ref 0–149)
VLDL Cholesterol Cal: 16 mg/dL (ref 5–40)

## 2021-07-18 LAB — VITAMIN D 25 HYDROXY (VIT D DEFICIENCY, FRACTURES): Vit D, 25-Hydroxy: 42.9 ng/mL (ref 30.0–100.0)

## 2021-07-18 NOTE — Progress Notes (Signed)
Remote ICD transmission.   

## 2021-07-21 ENCOUNTER — Telehealth: Payer: Self-pay | Admitting: Family Medicine

## 2021-07-21 NOTE — Telephone Encounter (Signed)
LM MAKING PT AWARE SAMPLES LEFT UP FRONT

## 2021-07-22 NOTE — Telephone Encounter (Signed)
Patient had appointment 07/17/21 with Deliah Boston, she is providing samples of diabetic medication and has scheduled patient an appointment with clinical pharmacist to obtain prescription assistance.

## 2021-08-07 ENCOUNTER — Other Ambulatory Visit: Payer: Self-pay | Admitting: Urology

## 2021-08-07 DIAGNOSIS — N138 Other obstructive and reflux uropathy: Secondary | ICD-10-CM

## 2021-08-07 DIAGNOSIS — N401 Enlarged prostate with lower urinary tract symptoms: Secondary | ICD-10-CM

## 2021-08-11 ENCOUNTER — Ambulatory Visit (INDEPENDENT_AMBULATORY_CARE_PROVIDER_SITE_OTHER): Payer: Medicare Other

## 2021-08-11 DIAGNOSIS — I5022 Chronic systolic (congestive) heart failure: Secondary | ICD-10-CM | POA: Diagnosis not present

## 2021-08-11 DIAGNOSIS — Z95810 Presence of automatic (implantable) cardiac defibrillator: Secondary | ICD-10-CM

## 2021-08-12 ENCOUNTER — Other Ambulatory Visit: Payer: Self-pay | Admitting: Urology

## 2021-08-12 DIAGNOSIS — N138 Other obstructive and reflux uropathy: Secondary | ICD-10-CM

## 2021-08-12 NOTE — Progress Notes (Signed)
EPIC Encounter for ICM Monitoring  Patient Name: Omar Williams is a 86 y.o. male Date: 08/12/2021 Primary Care Physican: Gwenlyn Fudge, FNP Primary Cardiologist:  Wyline Mood Electrophysiologist: [place] Nephrologist: [facility] Bi-V Pacing: 99.1% 05/05/2021 Weight: 158 lbs 06/10/2021 Weight: 160 lbs 08/12/2021 Weight: 161 lbs   Spoke with patient and heart failure questions reviewed.  Pt asymptomatic for fluid accumulation.  Reports feeling well at this time and voices no complaints.    Optivol Thoracic impedance suggesting normal fluid levels.     Prescribed:  Furosemide 20 mg take 1 tablet as needed for swelling.  Pt reports 6/8 taking Furosemide daily instead of PRN.   Labs: 07/17/2021 Creatinine 1.44, BUN 36, Potassium 4.6, Sodium 141, GFR 48 04/15/2021 Creatinine 1.41, BUN 22, Potassium 4.5, Sodium 144, GFR 49 A complete set of results can be found in Results Review.   Recommendations:  Encouraged to call if experiencing any fluid symptoms.    Follow-up plan: ICM clinic phone appointment on 09/15/2021.   91 day device clinic remote transmission 10/08/2021.       EP/Cardiology Office Visits: 11/19/2021 with Dr Wyline Mood.  Recall 08/06/2022 with Dr Ladona Ridgel.        Copy of ICM check sent to Dr. Johney Frame.   3 month ICM trend: 08/11/2021.    12-14 Month ICM trend:     Karie Soda, RN 08/12/2021 4:38 PM

## 2021-08-19 ENCOUNTER — Ambulatory Visit (INDEPENDENT_AMBULATORY_CARE_PROVIDER_SITE_OTHER): Payer: Medicare Other | Admitting: Pharmacist

## 2021-08-19 DIAGNOSIS — N183 Chronic kidney disease, stage 3 unspecified: Secondary | ICD-10-CM

## 2021-08-19 DIAGNOSIS — N1832 Chronic kidney disease, stage 3b: Secondary | ICD-10-CM

## 2021-08-19 NOTE — Patient Instructions (Signed)
Visit Information  Following are the goals we discussed today:  Current Barriers:  Unable to independently afford treatment regimen Unable to achieve control of T2DM  Suboptimal therapeutic regimen for T2DM   Pharmacist Clinical Goal(s):  patient will verbalize ability to afford treatment regimen achieve control of T2DM as evidenced by GOAL A1C, IMPROVED GLYCEMIC CONTROL through collaboration with PharmD and provider.    Interventions: 1:1 collaboration with Gwenlyn Fudge, FNP regarding development and update of comprehensive plan of care as evidenced by provider attestation and co-signature Inter-disciplinary care team collaboration (see longitudinal plan of care) Comprehensive medication review performed; medication list updated in electronic medical record  Diabetes: New goal. Uncontrolled-A1C 7.8%, GFR 48; current treatment:FARXIGA 10MG , GLIMEPIRIDE (discontinue);  A1C okay given age (75) Additional of farxiga will help Patient down to 1/2 tab of glimepiride--recommended stopping this Current glucose readings: fasting glucose: <135, post prandial glucose: N/A Denies hypoglycemic/hyperglycemic symptoms Discussed meal planning options and Plate method for healthy eating Avoid sugary drinks and desserts Incorporate balanced protein, non starchy veggies, 1 serving of carbohydrate with each meal Increase water intake Increase physical activity as able Current exercise: N/A Recommended d/c glimepiride Assessed patient finances. Enroll in [place] patient assistance for Farxiga  Patient Goals/Self-Care Activities patient will:  - take medications as prescribed as evidenced by patient report and record review check glucose daily , document, and provide at future appointments collaborate with provider on medication access solutions   Plan: Telephone follow up appointment with care management team member scheduled for:  6 MONTHS  Signature (98, PharmD,  BCPS Clinical Pharmacist, [facility]  II Phone [phone]   Please call the care guide team at [phone] if you need to cancel or reschedule your appointment.   The patient verbalized understanding of instructions, educational materials, and care plan provided today and DECLINED offer to receive copy of patient instructions, educational materials, and care plan.

## 2021-08-19 NOTE — Progress Notes (Signed)
Chronic Care Management Pharmacy Note  08/19/2021 Name:  Omar Williams MRN:  [id number] DOB:  02-23-1935  Summary:  Diabetes: New goal. Uncontrolled-A1C 7.8%, GFR 48; current treatment:FARXIGA 10MG, GLIMEPIRIDE (discontinue);  A1C okay given age (44) Additional of farxiga will help Patient down to 1/2 tab of glimepiride--recommended stopping this Current glucose readings: fasting glucose: <135, post prandial glucose: N/A Denies hypoglycemic/hyperglycemic symptoms Discussed meal planning options and Plate method for healthy eating Avoid sugary drinks and desserts Incorporate balanced protein, non starchy veggies, 1 serving of carbohydrate with each meal Increase water intake Increase physical activity as able Current exercise: N/A Recommended d/c glimepiride Assessed patient finances. Enroll in az&me patient assistance for [place]  Patient Goals/Self-Care Activities patient will:  - take medications as prescribed as evidenced by patient report and record review check glucose daily , document, and provide at future appointments collaborate with provider on medication access solutions   Subjective: Omar Williams is an 86 y.o. year old male who is a primary patient of Loman Brooklyn, FNP.  The CCM team was consulted for assistance with disease management and care coordination needs.    Engaged with patient by telephone for initial visit in response to provider referral for pharmacy case management and/or care coordination services.   Consent to Services:  The patient was given information about Chronic Care Management services, agreed to services, and gave verbal consent prior to initiation of services.  Please see initial visit note for detailed documentation.   Patient Care Team: Loman Brooklyn, FNP as PCP - General (Family Medicine) Thompson Grayer, MD as PCP - Electrophysiology (Cardiology) Harl Bowie Alphonse Guild, MD as PCP - Cardiology (Cardiology) Shirl Harris, [place]  (Optometry) Verta Ellen., NP as Nurse Practitioner (Cardiology) Liana Gerold, MD as Consulting Physician (Nephrology) Irine Seal, MD as Attending Physician (Urology) Aviva Signs, MD as Consulting Physician (General Surgery) Lavera Guise, [facility] as Finger Management (Pharmacist)  Objective:  Lab Results  Component Value Date   CREATININE 1.44 (H) 07/17/2021   CREATININE 1.41 (H) 04/15/2021   CREATININE 1.34 (H) 08/20/2020    Lab Results  Component Value Date   HGBA1C 7.5 (H) 07/17/2021   Last diabetic Eye exam:  Lab Results  Component Value Date/Time   HMDIABEYEEXA No Retinopathy 11/06/2020 12:00 AM    Last diabetic Foot exam: No results found for: "HMDIABFOOTEX"      Component Value Date/Time   CHOL 151 07/17/2021 0802   TRIG 86 07/17/2021 0802   HDL 61 07/17/2021 0802   CHOLHDL 2.5 07/17/2021 0802   CHOLHDL 3.1 08/08/2019 1323   VLDL 27 06/01/2017 1538   LDLCALC 74 07/17/2021 0802   LDLCALC 69 08/08/2019 1323       Latest Ref Rng & Units 07/17/2021    8:02 AM 04/15/2021    9:00 AM 01/14/2021    2:38 PM  Hepatic Function  Total Protein 6.0 - 8.5 g/dL 7.3  6.6  7.1   Albumin 3.6 - 4.6 g/dL 4.7  4.4  4.6   AST 0 - 40 IU/L '19  22  20   ' ALT 0 - 44 IU/L '21  21  22   ' Alk Phosphatase 44 - 121 IU/L 86  80  91   Total Bilirubin 0.0 - 1.2 mg/dL 0.8  0.7  0.5   Bilirubin, Direct 0.00 - 0.40 mg/dL   0.17     No results found for: "TSH", "FREET4"     Latest Ref Rng &  Units 07/17/2021    8:02 AM 04/15/2021    9:00 AM 02/09/2017    2:29 PM  CBC  WBC 3.4 - 10.8 x10E3/uL 8.3  9.4  7.6   Hemoglobin 13.0 - 17.7 g/dL 15.8  14.5  13.2   Hematocrit 37.5 - 51.0 % 48.0  44.7  39.0   Platelets 150 - 450 x10E3/uL 229  215  320     Lab Results  Component Value Date/Time   VD25OH 42.9 07/17/2021 08:02 AM   VD25OH 81 05/09/2020 12:00 AM    Clinical ASCVD: Yes  The ASCVD Risk score (Arnett DK, et al., 2019) failed to calculate for the  following reasons:   The 2019 ASCVD risk score is only valid for ages 36 to 40   The patient has a prior MI or stroke diagnosis    Other: (CHADS2VASc if Afib, PHQ9 if depression, MMRC or CAT for COPD, ACT, DEXA)  Social History   Tobacco Use  Smoking Status Former   Types: Cigarettes   Quit date: 1960   Years since quitting: 63.5  Smokeless Tobacco Never  Tobacco Comments   2-3 years as a teenager   BP Readings from Last 3 Encounters:  08/21/21 94/61  07/17/21 127/76  07/15/21 108/64   Pulse Readings from Last 3 Encounters:  08/21/21 83  07/17/21 62  07/15/21 74   Wt Readings from Last 3 Encounters:  07/17/21 161 lb 6.4 oz (73.2 kg)  07/15/21 163 lb 3.2 oz (74 kg)  06/16/21 160 lb (72.6 kg)    Assessment: Review of patient past medical history, allergies, medications, health status, including review of consultants reports, laboratory and other test data, was performed as part of comprehensive evaluation and provision of chronic care management services.   SDOH:  (Social Determinants of Health) assessments and interventions performed:    CCM Care Plan  No Known Allergies  Medications Reviewed Today     Reviewed by Irine Seal, MD (Physician) on 08/21/21 at 1614  Med List Status: <None>   Medication Order Taking? Sig Documenting Provider Last Dose Status Informant  aspirin EC 81 MG tablet [id number] No Take 81 mg by mouth daily. [provider] Taking Active Self  atorvastatin (LIPITOR) 80 MG tablet [id number] No TAKE 1 TABLET BY MOUTH EVERYDAY AT BEDTIME Loman Brooklyn, FNP Taking Active   Bee Pollen 550 MG CAPS [id number] No Take 2 capsules by mouth 2 (two) times daily. [provider] Taking Active   chlorhexidine (PERIDEX) 0.12 % solution [id number] No as needed. [provider] Taking Active   Cholecalciferol (VITAMIN D3) 125 MCG (5000 UT) TABS [id number] No Take 1 tablet by mouth daily.  [provider] Taking Active             Med Note Langston Masker, Oretha Milch Jun 16, 2021  3:34 PM) Takes once per week.   CINNAMON PO [id number] No Take 1,000 mg by mouth in the morning and at bedtime. [provider] Taking Active   dapagliflozin propanediol (FARXIGA) 10 MG TABS tablet [id number]  Take 1 tablet (10 mg total) by mouth daily before breakfast. Loman Brooklyn, FNP  Active   docusate sodium (COLACE) 50 MG capsule [id number] No Take 100 mg by mouth 2 (two) times daily. [provider] Taking Active   ezetimibe (ZETIA) 10 MG tablet [id number] No TAKE 1 TABLET BY MOUTH EVERY DAY Branch, Alphonse Guild, MD Taking Active   FIBER ADULT GUMMIES PO [id number] No Take 2  tablets by mouth at bedtime. [provider] Taking Active   finasteride (PROSCAR) 5 MG tablet [id number]  TAKE 1 TABLET BY MOUTH EVERYDAY AT BEDTIME Irine Seal, MD  Active   furosemide (LASIX) 20 MG tablet [id number] No TAKE 1 TABLET BY MOUTH DAILY AS NEEDED FOR SWELLING Branch, Alphonse Guild, MD Taking Active   glimepiride (AMARYL) 4 MG tablet [id number] No TAKE 1 TABLET (4 MG TOTAL) BY MOUTH IN THE MORNING AND AT BEDTIME. Doree Albee, MD Taking Active   glucose blood (ONETOUCH ULTRA) test strip [id number] No Test BS daily Dx E11.9 Loman Brooklyn, FNP Taking Active   lisinopril (ZESTRIL) 2.5 MG tablet [id number] No TAKE 1 TABLET BY MOUTH EVERY DAY Loman Brooklyn, FNP Taking Active   nitroGLYCERIN (NITROSTAT) 0.4 MG SL tablet [id number] No Place 1 tablet (0.4 mg total) under the tongue every 5 (five) minutes x 3 doses as needed for chest pain. Herminio Commons, MD Taking Active   Omega-3 Fatty Acids (FISH OIL) 1200 MG CAPS [id number] No Take 1 capsule by mouth 2 (two) times daily.  [provider] Taking Active   oxybutynin (DITROPAN) 5 MG tablet [id number]  Take 0.5 tablets (2.5 mg total) by mouth 3 (three) times daily. Irine Seal, MD  Active   pantoprazole (PROTONIX) 40 MG tablet [id number] No Take by mouth. [provider]  Taking Active   tamsulosin (FLOMAX) 0.4 MG CAPS capsule [id number]  TAKE 1 CAPSULE (0.4 MG TOTAL) BY MOUTH EVERY MORNING. Irine Seal, MD  Active   TURMERIC PO [id number] No Take 1,000 mg by mouth 2 (two) times daily. [provider] Taking Active   vitamin B-12 (CYANOCOBALAMIN) 1000 MCG tablet [id number] No Take 1,000 mcg by mouth every morning.  [provider] Taking Active   vitamin C (ASCORBIC ACID) 500 MG tablet [id number] No Take 1,000 mg by mouth 2 (two) times daily. [provider] Taking Active             Patient Active Problem List   Diagnosis Date Noted   Diabetic nephropathy ([place]) 09/17/2020   Presence of implantable cardioverter-defibrillator (ICD) 09/12/2020   History of nephrolithiasis 08/25/2019   Stage 3b chronic kidney disease ([place]) 12/07/2018   Type 2 diabetes mellitus with diabetic chronic kidney disease ([place]) 02/09/2017   Essential hypertension 02/09/2017   BPH with obstruction/lower urinary tract symptoms 02/09/2017   Vitamin D deficiency 02/09/2017   Other obstructive and reflux uropathy 02/09/2017   Coronary artery disease of native artery of native heart with stable angina pectoris ([place])    Ischemic cardiomyopathy 01/04/2017   Hyperlipidemia LDL goal <70 01/04/2017   History of non-ST elevation myocardial infarction (NSTEMI)    Chronic systolic heart failure ([place])     Immunization History  Administered Date(s) Administered   Fluad Quad(high Dose 65+) 10/17/2018, 10/02/2019   Influenza, High Dose Seasonal PF 10/31/2016   Influenza-Unspecified 12/04/2018, 11/06/2020   PNEUMOCOCCAL CONJUGATE-20 11/06/2020   Pneumococcal Conjugate-13 02/14/2015   Pneumococcal Polysaccharide-23 02/02/2006, 08/26/2017   Td 08/26/2017   Tdap 10/13/2011, 08/26/2017, 09/13/2018   Zoster Recombinat (Shingrix) 04/15/2021, 07/17/2021    Conditions to be addressed/monitored: CAD and DMII  Care Plan : PHARMD MEDICATION MANAGEMENT  Updates made by  Lavera Guise, [place] since 08/22/2021 12:00 AM     Problem: DISEASE PROGRESSION PREVENTION      Goal: Patient Stated       Medication Assistance: Application for [facility]   medication assistance program. in process.  Anticipated assistance start date  TBD.  See plan of care for additional detail.  Patient's preferred pharmacy is:  [id number] [place], [place]Phone: [phone]: [phone] [place]Phone: [phone]: [phone] Follow Up:  Patient agrees to Care Plan and Follow-up.  Plan: Telephone follow up appointment with care management team member scheduled for:  2 MONTHS   JRegina Eck PharmD, BCPS Clinical Pharmacist, [place] Phone [phone]

## 2021-08-21 ENCOUNTER — Ambulatory Visit (INDEPENDENT_AMBULATORY_CARE_PROVIDER_SITE_OTHER): Payer: Medicare Other | Admitting: Urology

## 2021-08-21 ENCOUNTER — Encounter: Payer: Self-pay | Admitting: Urology

## 2021-08-21 VITALS — BP 94/61 | HR 83

## 2021-08-21 DIAGNOSIS — N3281 Overactive bladder: Secondary | ICD-10-CM | POA: Diagnosis not present

## 2021-08-21 DIAGNOSIS — N401 Enlarged prostate with lower urinary tract symptoms: Secondary | ICD-10-CM | POA: Diagnosis not present

## 2021-08-21 DIAGNOSIS — Z87442 Personal history of urinary calculi: Secondary | ICD-10-CM | POA: Diagnosis not present

## 2021-08-21 DIAGNOSIS — N281 Cyst of kidney, acquired: Secondary | ICD-10-CM

## 2021-08-21 DIAGNOSIS — I255 Ischemic cardiomyopathy: Secondary | ICD-10-CM | POA: Diagnosis not present

## 2021-08-21 DIAGNOSIS — R351 Nocturia: Secondary | ICD-10-CM

## 2021-08-21 DIAGNOSIS — N138 Other obstructive and reflux uropathy: Secondary | ICD-10-CM

## 2021-08-21 LAB — MICROSCOPIC EXAMINATION
Epithelial Cells (non renal): NONE SEEN /hpf (ref 0–10)
RBC, Urine: NONE SEEN /hpf (ref 0–2)
Renal Epithel, UA: NONE SEEN /hpf
WBC, UA: NONE SEEN /hpf (ref 0–5)

## 2021-08-21 LAB — URINALYSIS, ROUTINE W REFLEX MICROSCOPIC
Bilirubin, UA: NEGATIVE
Leukocytes,UA: NEGATIVE
Nitrite, UA: NEGATIVE
RBC, UA: NEGATIVE
Specific Gravity, UA: 1.01 (ref 1.005–1.030)
Urobilinogen, Ur: 0.2 mg/dL (ref 0.2–1.0)
pH, UA: 5 (ref 5.0–7.5)

## 2021-08-21 MED ORDER — OXYBUTYNIN CHLORIDE 5 MG PO TABS: 2.5000 mg | ORAL_TABLET | Freq: Three times a day (TID) | ORAL | 3 refills | Status: DC

## 2021-08-21 NOTE — Progress Notes (Signed)
Subjective:  1. BPH with obstruction/lower urinary tract symptoms   2. Nocturia   3. History of nephrolithiasis   4. Renal cyst     .   Omar Williams returns today in f/u. he has a history of stones and renal cysts.  He has had no flank pain or hematuria.    He remains on tamsulosin, finasteride and oxybutynin 2.5mg  TID.  His nocturia remains 3x but he takes his diuretic in the evening. His IPSS is 5.  His UA has 3+ glucose but he is on [place].  He has no associated signs or symptoms.     ROS:  ROS:  A complete review of systems was performed.  All systems are negative except for pertinent findings as noted.   Review of Systems  Gastrointestinal:  Positive for heartburn.  Endo/Heme/Allergies:  Bruises/bleeds easily.  All other systems reviewed and are negative.   No Known Allergies  Outpatient Encounter Medications as of 08/21/2021  Medication Sig Note   aspirin EC 81 MG tablet Take 81 mg by mouth daily.    atorvastatin (LIPITOR) 80 MG tablet TAKE 1 TABLET BY MOUTH EVERYDAY AT BEDTIME    Bee Pollen 550 MG CAPS Take 2 capsules by mouth 2 (two) times daily.    chlorhexidine (PERIDEX) 0.12 % solution as needed.    Cholecalciferol (VITAMIN D3) 125 MCG (5000 UT) TABS Take 1 tablet by mouth daily.  06/16/2021: Takes once per week.    CINNAMON PO Take 1,000 mg by mouth in the morning and at bedtime.    dapagliflozin propanediol (FARXIGA) 10 MG TABS tablet Take 1 tablet (10 mg total) by mouth daily before breakfast.    docusate sodium (COLACE) 50 MG capsule Take 100 mg by mouth 2 (two) times daily.    ezetimibe (ZETIA) 10 MG tablet TAKE 1 TABLET BY MOUTH EVERY DAY    FIBER ADULT GUMMIES PO Take 2 tablets by mouth at bedtime.    finasteride (PROSCAR) 5 MG tablet TAKE 1 TABLET BY MOUTH EVERYDAY AT BEDTIME    furosemide (LASIX) 20 MG tablet TAKE 1 TABLET BY MOUTH DAILY AS NEEDED FOR SWELLING    glimepiride (AMARYL) 4 MG tablet TAKE 1 TABLET (4 MG TOTAL) BY MOUTH IN THE MORNING AND AT  BEDTIME.    glucose blood (ONETOUCH ULTRA) test strip Test BS daily Dx E11.9    lisinopril (ZESTRIL) 2.5 MG tablet TAKE 1 TABLET BY MOUTH EVERY DAY    nitroGLYCERIN (NITROSTAT) 0.4 MG SL tablet Place 1 tablet (0.4 mg total) under the tongue every 5 (five) minutes x 3 doses as needed for chest pain.    Omega-3 Fatty Acids (FISH OIL) 1200 MG CAPS Take 1 capsule by mouth 2 (two) times daily.     oxybutynin (DITROPAN) 5 MG tablet Take 0.5 tablets (2.5 mg total) by mouth 3 (three) times daily.    pantoprazole (PROTONIX) 40 MG tablet Take by mouth.    tamsulosin (FLOMAX) 0.4 MG CAPS capsule TAKE 1 CAPSULE (0.4 MG TOTAL) BY MOUTH EVERY MORNING.    TURMERIC PO Take 1,000 mg by mouth 2 (two) times daily.    vitamin B-12 (CYANOCOBALAMIN) 1000 MCG tablet Take 1,000 mcg by mouth every morning.     vitamin C (ASCORBIC ACID) 500 MG tablet Take 1,000 mg by mouth 2 (two) times daily.    [DISCONTINUED] oxybutynin (DITROPAN) 5 MG tablet Take 0.5 tablets (2.5 mg total) by mouth 3 (three) times daily.    No facility-administered encounter medications on file as  of 08/21/2021.    Past Medical History:  Diagnosis Date   Acid reflux    Blood transfusion without reported diagnosis    Cardiac arrest (HCC)    a. occuring in 12/2016. LAD disease but no targets for CABG or PCI. Underwent ICD placement as EF 15-20%.    CHF (congestive heart failure) (HCC)    Coronary artery disease    Diabetes mellitus without complication (HCC)    Diabetic nephropathy (HCC)    Hyperlipidemia    Hypertension    Myocardial infarction [facility])    Renal disorder     Past Surgical History:  Procedure Laterality Date   BIV ICD INSERTION CRT-D N/A 01/01/2017   Procedure: BIV ICD INSERTION CRT-D;  Surgeon: Regan Lemming, MD;  Location: [facility] INVASIVE CV LAB;  Service: Cardiovascular;  Laterality: N/A;    Social History   Socioeconomic History   Marital status: Married    Spouse name: johnna   Number of children: 3   Years of  education: 12   Highest education level: Some college, no degree  Occupational History   Occupation: Health and safety inspector   retired   Occupation: Education officer, environmental  Tobacco Use   Smoking status: Former    Types: Cigarettes    Quit date: 1960    Years since quitting: 63.5   Smokeless tobacco: Never   Tobacco comments:    2-3 years as a teenager  Building services engineer Use: Never used  Substance and Sexual Activity   Alcohol use: No   Drug use: No   Sexual activity: Not Currently  Other Topics Concern   Not on file  Social History Narrative   Lives with with wife Omar Williams   Married 60 years   Daughter Omar Williams stays frequently   Granddaughter Omar Williams near by   Social Determinants of Health   Financial Resource Strain: Low Risk  (06/16/2021)   Overall Financial Resource Strain (CARDIA)    Difficulty of Paying Living Expenses: Not hard at all  Food Insecurity: No Food Insecurity (06/16/2021)   Hunger Vital Sign    Worried About Running Out of Food in the Last Year: Never true    Ran Out of Food in the Last Year: Never true  Transportation Needs: No Transportation Needs (06/16/2021)   PRAPARE - Administrator, Civil Service (Medical): No    Lack of Transportation (Non-Medical): No  Physical Activity: Sufficiently Active (06/16/2021)   Exercise Vital Sign    Days of Exercise per Week: 5 days    Minutes of Exercise per Session: 30 min  Stress: No Stress Concern Present (06/16/2021)   [employer] of Occupational Health - Occupational Stress Questionnaire    Feeling of Stress : Not at all  Social Connections: Socially Integrated (06/16/2021)   Social Connection and Isolation Panel [NHANES]    Frequency of Communication with Friends and Family: More than three times a week    Frequency of Social Gatherings with Friends and Family: More than three times a week    Attends Religious Services: More than 4 times per year    Active Member of [employer] or Organizations: Yes    Attends Tax inspector Meetings: More than 4 times per year    Marital Status: Married  Catering manager Violence: Not At Risk (06/16/2021)   Humiliation, Afraid, Rape, and Kick questionnaire    Fear of Current or Ex-Partner: No    Emotionally Abused: No    Physically Abused: No  Sexually Abused: No    Family History  Problem Relation Age of Onset   Hypertension Mother    Hyperlipidemia Mother    CAD Sister    Diabetes Sister    Hyperlipidemia Sister    Hypertension Sister    CAD Brother    Diabetes Brother    Hypertension Brother    Hyperlipidemia Brother    CAD Brother    Diabetes Brother    Hypertension Brother    Hyperlipidemia Brother    Dementia Brother    Thyroid disease Daughter    Hypertension Daughter    Diabetes Daughter    Hyperlipidemia Daughter    Fibromyalgia Daughter    Heart disease Daughter        stents   Multiple sclerosis Daughter    Thyroid disease Daughter        Objective: Vitals:   08/21/21 1425  BP: 94/61  Pulse: 83     Physical Exam  Lab Results:  Results for orders placed or performed in visit on 08/21/21 (from the past 24 hour(s))  Urinalysis, Routine w reflex microscopic     Status: Abnormal   Collection Time: 08/21/21  2:31 PM  Result Value Ref Range   Specific Gravity, UA 1.010 1.005 - 1.030   pH, UA 5.0 5.0 - 7.5   Color, UA Yellow Yellow   Appearance Ur Clear Clear   Leukocytes,UA Negative Negative   Protein,UA 1+ (A) Negative/Trace   Glucose, UA 3+ (A) Negative   Ketones, UA Trace (A) Negative   RBC, UA Negative Negative   Bilirubin, UA Negative Negative   Urobilinogen, Ur 0.2 0.2 - 1.0 mg/dL   Nitrite, UA Negative Negative   Microscopic Examination See below:    Narrative   Performed at:  [address] [facility] [address] [place], [place]  [id number] Lab Director: Chinita Pester MT, Phone:  [phone]  Microscopic Examination     Status: None   Collection Time: 08/21/21  2:31 PM   Urine  Result Value Ref  Range   WBC, UA None seen 0 - 5 /hpf   RBC, Urine None seen 0 - 2 /hpf   Epithelial Cells (non renal) None seen 0 - 10 /hpf   Renal Epithel, UA None seen None seen /hpf   Mucus, UA Present Not Estab.   Bacteria, UA Few None seen/Few   Narrative   Performed at:  [address] - [facility] [address] [place], [place]  [id number] Lab Director: Chinita Pester MT, Phone:  [phone]     BMET No results for input(s): "NA", "K", "CL", "CO2", "GLUCOSE", "BUN", "CREATININE", "CALCIUM" in the last 72 hours. PSA No results found for: "PSA" No results found for: "TESTOSTERONE"    Studies/Results: No results found.    Assessment & Plan:  BPH with BOO and OAB.  I have refilled his meds.  The finasteride and tamsulosin were refilled on 7/13 since the scripts had expired and the oxybutynin today.   Renal stones and cysts.   He is asymptomatic.    Meds ordered this encounter  Medications   oxybutynin (DITROPAN) 5 MG tablet    Sig: Take 0.5 tablets (2.5 mg total) by mouth 3 (three) times daily.    Dispense:  135 each    Refill:  3     Orders Placed This Encounter  Procedures   Microscopic Examination   Urinalysis, Routine w reflex microscopic      Return in about 1 year (around 08/22/2022).  CC: Gwenlyn Fudge, FNP      Bjorn Pippin 08/21/2021

## 2021-08-31 ENCOUNTER — Other Ambulatory Visit: Payer: Self-pay | Admitting: Urology

## 2021-08-31 DIAGNOSIS — N138 Other obstructive and reflux uropathy: Secondary | ICD-10-CM

## 2021-09-01 DIAGNOSIS — N183 Chronic kidney disease, stage 3 unspecified: Secondary | ICD-10-CM | POA: Diagnosis not present

## 2021-09-01 DIAGNOSIS — E1122 Type 2 diabetes mellitus with diabetic chronic kidney disease: Secondary | ICD-10-CM

## 2021-09-05 ENCOUNTER — Other Ambulatory Visit: Payer: Self-pay | Admitting: Family Medicine

## 2021-09-05 DIAGNOSIS — K219 Gastro-esophageal reflux disease without esophagitis: Secondary | ICD-10-CM

## 2021-09-06 ENCOUNTER — Other Ambulatory Visit: Payer: Self-pay | Admitting: Family Medicine

## 2021-09-06 DIAGNOSIS — E1121 Type 2 diabetes mellitus with diabetic nephropathy: Secondary | ICD-10-CM

## 2021-09-08 ENCOUNTER — Telehealth: Payer: Self-pay | Admitting: Family Medicine

## 2021-09-08 NOTE — Telephone Encounter (Signed)
Patient aware samples up front of faxiga

## 2021-09-08 NOTE — Telephone Encounter (Signed)
Patient is out of and would like to know if we have any samples that he could have. He is completely out at this time. Please call back and advise.

## 2021-09-15 ENCOUNTER — Ambulatory Visit (INDEPENDENT_AMBULATORY_CARE_PROVIDER_SITE_OTHER): Payer: Medicare Other

## 2021-09-15 DIAGNOSIS — I5022 Chronic systolic (congestive) heart failure: Secondary | ICD-10-CM

## 2021-09-15 DIAGNOSIS — Z95810 Presence of automatic (implantable) cardiac defibrillator: Secondary | ICD-10-CM | POA: Diagnosis not present

## 2021-09-17 NOTE — Progress Notes (Signed)
EPIC Encounter for ICM Monitoring  Patient Name: Omar Williams is a 86 y.o. male Date: 09/17/2021 Primary Care Physican: Gwenlyn Fudge, FNP Primary Cardiologist:  Wyline Mood Electrophysiologist: [place] Nephrologist: [facility] Bi-V Pacing: 99.1% 08/12/2021 Weight: 161 lbs 09/17/2021 Weight: 162 lbs   Spoke with patient and heart failure questions reviewed.  Pt asymptomatic for fluid accumulation. He reports he has a cold but otherwise without complaints.     Optivol Thoracic impedance suggesting normal fluid levels with exception of possible fluid accumulation from 8/2-8/9.   Prescribed:  Furosemide 20 mg take 1 tablet as needed for swelling.  Pt reports 6/8 taking Furosemide daily instead of PRN.   Labs: 07/17/2021 Creatinine 1.44, BUN 36, Potassium 4.6, Sodium 141, GFR 48 04/15/2021 Creatinine 1.41, BUN 22, Potassium 4.5, Sodium 144, GFR 49 A complete set of results can be found in Results Review.   Recommendations:  Encouraged to call if experiencing any fluid symptoms.    Follow-up plan: ICM clinic phone appointment on 10/22/2021.   91 day device clinic remote transmission 10/08/2021.       EP/Cardiology Office Visits: 11/19/2021 with Dr Wyline Mood.  Recall 08/06/2022 with Dr Ladona Ridgel.        Copy of ICM check sent to Dr. Johney Frame.    3 month ICM trend: 09/15/2021.    12-14 Month ICM trend:     Karie Soda, RN 09/17/2021 11:50 AM

## 2021-10-08 ENCOUNTER — Ambulatory Visit (INDEPENDENT_AMBULATORY_CARE_PROVIDER_SITE_OTHER): Payer: Medicare Other

## 2021-10-08 DIAGNOSIS — I255 Ischemic cardiomyopathy: Secondary | ICD-10-CM

## 2021-10-08 LAB — CUP PACEART REMOTE DEVICE CHECK
Battery Remaining Longevity: 28 mo
Battery Voltage: 2.95 V
Brady Statistic AP VP Percent: 53.05 %
Brady Statistic AP VS Percent: 0.04 %
Brady Statistic AS VP Percent: 45.98 %
Brady Statistic AS VS Percent: 0.94 %
Brady Statistic RA Percent Paced: 53.01 %
Brady Statistic RV Percent Paced: 93.97 %
Date Time Interrogation Session: 20230906022821
HighPow Impedance: 85 Ohm
Implantable Lead Implant Date: 20181130
Implantable Lead Implant Date: 20181130
Implantable Lead Implant Date: 20181130
Implantable Lead Location: 753858
Implantable Lead Location: 753859
Implantable Lead Location: 753860
Implantable Lead Model: 4598
Implantable Lead Model: 5076
Implantable Lead Model: 6935
Implantable Pulse Generator Implant Date: 20181130
Lead Channel Impedance Value: 141.867
Lead Channel Impedance Value: 145.871
Lead Channel Impedance Value: 152 Ohm
Lead Channel Impedance Value: 156.606
Lead Channel Impedance Value: 156.606
Lead Channel Impedance Value: 266 Ohm
Lead Channel Impedance Value: 304 Ohm
Lead Channel Impedance Value: 304 Ohm
Lead Channel Impedance Value: 323 Ohm
Lead Channel Impedance Value: 342 Ohm
Lead Channel Impedance Value: 380 Ohm
Lead Channel Impedance Value: 494 Ohm
Lead Channel Impedance Value: 513 Ohm
Lead Channel Impedance Value: 513 Ohm
Lead Channel Impedance Value: 532 Ohm
Lead Channel Impedance Value: 570 Ohm
Lead Channel Impedance Value: 627 Ohm
Lead Channel Impedance Value: 703 Ohm
Lead Channel Pacing Threshold Amplitude: 0.375 V
Lead Channel Pacing Threshold Amplitude: 0.625 V
Lead Channel Pacing Threshold Amplitude: 1.375 V
Lead Channel Pacing Threshold Pulse Width: 0.4 ms
Lead Channel Pacing Threshold Pulse Width: 0.4 ms
Lead Channel Pacing Threshold Pulse Width: 0.4 ms
Lead Channel Sensing Intrinsic Amplitude: 3 mV
Lead Channel Sensing Intrinsic Amplitude: 3 mV
Lead Channel Sensing Intrinsic Amplitude: 8.625 mV
Lead Channel Sensing Intrinsic Amplitude: 8.625 mV
Lead Channel Setting Pacing Amplitude: 2 V
Lead Channel Setting Pacing Amplitude: 2 V
Lead Channel Setting Pacing Amplitude: 2.5 V
Lead Channel Setting Pacing Pulse Width: 0.4 ms
Lead Channel Setting Pacing Pulse Width: 0.4 ms
Lead Channel Setting Sensing Sensitivity: 0.3 mV

## 2021-10-09 ENCOUNTER — Ambulatory Visit (INDEPENDENT_AMBULATORY_CARE_PROVIDER_SITE_OTHER): Payer: Medicare Other | Admitting: Family Medicine

## 2021-10-09 ENCOUNTER — Encounter: Payer: Self-pay | Admitting: Family Medicine

## 2021-10-09 VITALS — BP 108/62 | HR 60 | Temp 97.9°F | Resp 20 | Ht 67.0 in | Wt 162.0 lb

## 2021-10-09 DIAGNOSIS — I10 Essential (primary) hypertension: Secondary | ICD-10-CM

## 2021-10-09 DIAGNOSIS — N1832 Chronic kidney disease, stage 3b: Secondary | ICD-10-CM | POA: Diagnosis not present

## 2021-10-09 DIAGNOSIS — E1121 Type 2 diabetes mellitus with diabetic nephropathy: Secondary | ICD-10-CM | POA: Diagnosis not present

## 2021-10-09 DIAGNOSIS — E1122 Type 2 diabetes mellitus with diabetic chronic kidney disease: Secondary | ICD-10-CM

## 2021-10-09 DIAGNOSIS — N183 Chronic kidney disease, stage 3 unspecified: Secondary | ICD-10-CM

## 2021-10-09 DIAGNOSIS — I25118 Atherosclerotic heart disease of native coronary artery with other forms of angina pectoris: Secondary | ICD-10-CM | POA: Diagnosis not present

## 2021-10-09 DIAGNOSIS — I5022 Chronic systolic (congestive) heart failure: Secondary | ICD-10-CM | POA: Diagnosis not present

## 2021-10-09 DIAGNOSIS — E785 Hyperlipidemia, unspecified: Secondary | ICD-10-CM | POA: Diagnosis not present

## 2021-10-09 DIAGNOSIS — I252 Old myocardial infarction: Secondary | ICD-10-CM | POA: Diagnosis not present

## 2021-10-09 DIAGNOSIS — E559 Vitamin D deficiency, unspecified: Secondary | ICD-10-CM

## 2021-10-09 LAB — BAYER DCA HB A1C WAIVED: HB A1C (BAYER DCA - WAIVED): 7 % — ABNORMAL HIGH (ref 4.8–5.6)

## 2021-10-09 MED ORDER — ATORVASTATIN CALCIUM 80 MG PO TABS: 80.0000 mg | ORAL_TABLET | Freq: Every day | ORAL | 1 refills | Status: DC

## 2021-10-09 MED ORDER — LISINOPRIL 2.5 MG PO TABS: 2.5000 mg | ORAL_TABLET | Freq: Every day | ORAL | 1 refills | Status: DC

## 2021-10-09 MED ORDER — FUROSEMIDE 20 MG PO TABS: 20.0000 mg | ORAL_TABLET | Freq: Every day | ORAL | 1 refills | Status: DC | PRN

## 2021-10-09 NOTE — Progress Notes (Signed)
Assessment & Plan:  1. Type 2 diabetes mellitus with stage 3 chronic kidney disease, without long-term current use of insulin, unspecified whether stage 3a or 3b CKD (HCC) A1c 7.0 which has decreased from 7.5 - Diabetes is at goal of A1c in the 7s due to his age. - Medications: continue current medications - Home glucose monitoring: continue monitoring - Patient is currently taking a statin. Patient is taking an ACE-inhibitor/ARB.  - Instruction/counseling given: discussed foot care  Diabetes Health Maintenance Due  Topic Date Due   OPHTHALMOLOGY EXAM  11/06/2021   HEMOGLOBIN A1C  01/16/2022   FOOT EXAM  10/10/2022    Lab Results  Component Value Date   LABMICR See below: 08/21/2021   LABMICR 134.1 09/12/2020   MICROALBUR 14.6 08/08/2019   MICROALBUR 17.8 04/17/2019   - CBC with Differential/Platelet - CMP14+EGFR - Lipid panel - Bayer DCA Hb A1c Waived - atorvastatin (LIPITOR) 80 MG tablet; Take 1 tablet (80 mg total) by mouth daily.  Dispense: 90 tablet; Refill: 1 - Microalbumin / creatinine urine ratio  2. Diabetic nephropathy associated with type 2 diabetes mellitus ([place]) Diabetes is well controlled. Continue ACE-inhibitor and SGLT2. - CMP14+EGFR - lisinopril (ZESTRIL) 2.5 MG tablet; Take 1 tablet (2.5 mg total) by mouth daily.  Dispense: 90 tablet; Refill: 1 - Microalbumin / creatinine urine ratio  3. Stage 3b chronic kidney disease ([place]) Continue Farxiga. - CBC with Differential/Platelet - CMP14+EGFR  4. Essential hypertension Well controlled on current regimen.  - CBC with Differential/Platelet - CMP14+EGFR  5. Chronic systolic heart failure (HCC) Well controlled on current regimen.  - CBC with Differential/Platelet - CMP14+EGFR - Lipid panel - furosemide (LASIX) 20 MG tablet; Take 1 tablet (20 mg total) by mouth daily as needed for edema.  Dispense: 90 tablet; Refill: 1  6. Hyperlipidemia LDL goal <70 Well controlled on current regimen.  - CBC with  Differential/Platelet - CMP14+EGFR - Lipid panel - atorvastatin (LIPITOR) 80 MG tablet; Take 1 tablet (80 mg total) by mouth daily.  Dispense: 90 tablet; Refill: 1  7. Coronary artery disease of native artery of native heart with stable angina pectoris (HCC) Continue current regimen. - CBC with Differential/Platelet - CMP14+EGFR - Lipid panel - atorvastatin (LIPITOR) 80 MG tablet; Take 1 tablet (80 mg total) by mouth daily.  Dispense: 90 tablet; Refill: 1  8. History of non-ST elevation myocardial infarction (NSTEMI) Continue current regimen. - atorvastatin (LIPITOR) 80 MG tablet; Take 1 tablet (80 mg total) by mouth daily.  Dispense: 90 tablet; Refill: 1  9. Vitamin D deficiency Well controlled on current regimen.    Return in about 4 months (around 02/08/2022) for follow-up of chronic medication conditions with [place]akes.  Hendricks Limes, MSN, APRN, FNP-C [facility]  Subjective:    Patient ID: Omar Williams, male    DOB: 05-27-1935, 86 y.o.   MRN: [id number]  Patient Care Team: Loman Brooklyn, FNP as PCP - General (Family Medicine) Thompson Grayer, MD as PCP - Electrophysiology (Cardiology) Harl Bowie Alphonse Guild, MD as PCP - Cardiology (Cardiology) Shirl Harris, [place] (Optometry) Verta Ellen., NP as Nurse Practitioner (Cardiology) Liana Gerold, MD as Consulting Physician (Nephrology) Irine Seal, MD as Attending Physician (Urology) Aviva Signs, MD as Consulting Physician (General Surgery) Lavera Guise, [facility] as Pharmacist (Family Medicine)   Chief Complaint:  Chief Complaint  Patient presents with   Medical Management of Chronic Issues    3 mo     HPI: Omar Williams is  a 86 y.o. male presenting on 10/09/2021 for Medical Management of Chronic Issues (3 mo )  Diabetes: Patient presents for follow up of diabetes. Current symptoms include: hyperglycemia. Denies any episodes of hypoglycemia. Known diabetic complications: nephropathy and  cardiovascular disease. Medication compliance: taking Farxiga 10 mg daily and glimepiride 2 mg at bedtime. Current diet: in general, a "healthy" diet  . Current exercise: walking and weightlifting. Home blood sugar records: BGs are running  consistent with Hgb A1C. Is he  on ACE inhibitor or angiotensin II receptor blocker? Yes (Lisinopril). Is he on a statin? Yes (Atorvastatin).   CKD with vitamin D deficiency: Managed by Dr. Theador Hawthorne, nephrologist, who he last saw on 07/10/2021.  He was advised at that time to decrease vitamin D 5,000 units from daily to weekly. He is taking [place], which he gets with prescription assistance through [employer].  CHF/history of V-Fib/hypertension/CAD/hyperlipidemia: Patient saw his electrophysiologist on 01/07/2021 and his general cardiologist on 07/15/2021.   New complaints: None   Social history:  Relevant past medical, surgical, family and social history reviewed and updated as indicated. Interim medical history since our last visit reviewed.  Allergies and medications reviewed and updated.  DATA REVIEWED: CHART IN EPIC  ROS: Negative unless specifically indicated above in HPI.    Current Outpatient Medications:    aspirin EC 81 MG tablet, Take 81 mg by mouth daily., Disp: , Rfl:    atorvastatin (LIPITOR) 80 MG tablet, TAKE 1 TABLET BY MOUTH EVERYDAY AT BEDTIME, Disp: 90 tablet, Rfl: 1   Bee Pollen 550 MG CAPS, Take 2 capsules by mouth 2 (two) times daily., Disp: , Rfl:    Cholecalciferol (VITAMIN D3) 125 MCG (5000 UT) TABS, Take 1 tablet by mouth daily. , Disp: , Rfl:    CINNAMON PO, Take 1,000 mg by mouth in the morning and at bedtime., Disp: , Rfl:    dapagliflozin propanediol (FARXIGA) 10 MG TABS tablet, Take 1 tablet (10 mg total) by mouth daily before breakfast., Disp: 21 tablet, Rfl: 0   docusate sodium (COLACE) 50 MG capsule, Take 100 mg by mouth 2 (two) times daily., Disp: , Rfl:    ezetimibe (ZETIA) 10 MG tablet, TAKE 1 TABLET BY MOUTH EVERY DAY,  Disp: 90 tablet, Rfl: 1   finasteride (PROSCAR) 5 MG tablet, TAKE 1 TABLET BY MOUTH EVERYDAY AT BEDTIME, Disp: 90 tablet, Rfl: 3   furosemide (LASIX) 20 MG tablet, TAKE 1 TABLET BY MOUTH DAILY AS NEEDED FOR SWELLING, Disp: 90 tablet, Rfl: 1   glimepiride (AMARYL) 4 MG tablet, TAKE 1 TABLET (4 MG TOTAL) BY MOUTH IN THE MORNING AND AT BEDTIME., Disp: 180 tablet, Rfl: 0   glucose blood (ONETOUCH ULTRA) test strip, Test BS daily Dx E11.9, Disp: 100 strip, Rfl: 3   lisinopril (ZESTRIL) 2.5 MG tablet, TAKE 1 TABLET BY MOUTH EVERY DAY, Disp: 90 tablet, Rfl: 0   Omega-3 Fatty Acids (FISH OIL) 1200 MG CAPS, Take 1 capsule by mouth 2 (two) times daily. , Disp: , Rfl:    oxybutynin (DITROPAN) 5 MG tablet, Take 0.5 tablets (2.5 mg total) by mouth 3 (three) times daily., Disp: 135 each, Rfl: 3   pantoprazole (PROTONIX) 40 MG tablet, Take 1 tablet (40 mg total) by mouth daily., Disp: 90 tablet, Rfl: 1   tamsulosin (FLOMAX) 0.4 MG CAPS capsule, TAKE 1 CAPSULE (0.4 MG TOTAL) BY MOUTH EVERY MORNING., Disp: 90 capsule, Rfl: 3   TURMERIC PO, Take 1,000 mg by mouth 2 (two) times daily., Disp: , Rfl:  vitamin C (ASCORBIC ACID) 500 MG tablet, Take 1,000 mg by mouth 2 (two) times daily., Disp: , Rfl:    chlorhexidine (PERIDEX) 0.12 % solution, as needed., Disp: , Rfl:    FIBER ADULT GUMMIES PO, Take 2 tablets by mouth at bedtime. (Patient not taking: Reported on 10/09/2021), Disp: , Rfl:    nitroGLYCERIN (NITROSTAT) 0.4 MG SL tablet, Place 1 tablet (0.4 mg total) under the tongue every 5 (five) minutes x 3 doses as needed for chest pain. (Patient not taking: Reported on 10/09/2021), Disp: 25 tablet, Rfl: 3   No Known Allergies Past Medical History:  Diagnosis Date   Acid reflux    Blood transfusion without reported diagnosis    Cardiac arrest ([place])    a. occuring in 12/2016. LAD disease but no targets for CABG or PCI. Underwent ICD placement as EF 15-20%.    CHF (congestive heart failure) (HCC)    Coronary artery  disease    Diabetes mellitus without complication ([place])    Diabetic nephropathy ([place])    Hyperlipidemia    Hypertension    Myocardial infarction [facility])    Renal disorder     Past Surgical History:  Procedure Laterality Date   BIV ICD INSERTION CRT-D N/A 01/01/2017   Procedure: BIV ICD INSERTION CRT-D;  Surgeon: Constance Haw, MD;  Location: [facility];  Service: Cardiovascular;  Laterality: N/A;    Social History   Socioeconomic History   Marital status: Married    Spouse name: johnna   Number of children: 3   Years of education: 12   Highest education level: Some college, no degree  Occupational History   Occupation: Radiographer, therapeutic   retired   Occupation: Theme park manager  Tobacco Use   Smoking status: Former    Types: Cigarettes    Quit date: 1960    Years since quitting: 63.7   Smokeless tobacco: Never   Tobacco comments:    2-3 years as a teenager  Scientific laboratory technician Use: Never used  Substance and Sexual Activity   Alcohol use: No   Drug use: No   Sexual activity: Not Currently  Other Topics Concern   Not on file  Social History Narrative   Lives with with wife [name]   Married 64 years   Daughter [name] stays frequently   Granddaughter Jacqlyn Larsen near by   Social Determinants of Health   Financial Resource Strain: Low Risk  (06/16/2021)   Overall Financial Resource Strain (CARDIA)    Difficulty of Paying Living Expenses: Not hard at all  Food Insecurity: No Food Insecurity (06/16/2021)   Hunger Vital Sign    Worried About Running Out of Food in the Last Year: Never true    [place] in the Last Year: Never true  Transportation Needs: No Transportation Needs (06/16/2021)   PRAPARE - Hydrologist (Medical): No    Lack of Transportation (Non-Medical): No  Physical Activity: Sufficiently Active (06/16/2021)   Exercise Vital Sign    Days of Exercise per Week: 5 days    Minutes of Exercise per Session: 30 min  Stress: No  Stress Concern Present (06/16/2021)   [place]    Feeling of Stress : Not at all  Social Connections: [place] (06/16/2021)   Social Connection and Isolation Panel [NHANES]    Frequency of Communication with Friends and Family: More than three times a week  Frequency of Social Gatherings with Friends and Family: More than three times a week    Attends Religious Services: More than 4 times per year    Active Member of Clubs or Organizations: Yes    Attends Music therapist: More than 4 times per year    Marital Status: Married  Human resources officer Violence: Not At Risk (06/16/2021)   Humiliation, Afraid, Rape, and Kick questionnaire    Fear of Current or Ex-Partner: No    Emotionally Abused: No    Physically Abused: No    Sexually Abused: No        Objective:    BP 108/62   Pulse 60   Temp 97.9 F (36.6 C)   Resp 20   Ht '5\' 7"'  (1.702 m)   Wt 162 lb (73.5 kg)   SpO2 94%   BMI 25.37 kg/m   Wt Readings from Last 3 Encounters:  10/09/21 162 lb (73.5 kg)  07/17/21 161 lb 6.4 oz (73.2 kg)  07/15/21 163 lb 3.2 oz (74 kg)    Physical Exam Vitals reviewed.  Constitutional:      General: He is not in acute distress.    Appearance: Normal appearance. He is normal weight. He is not ill-appearing, toxic-appearing or diaphoretic.  HENT:     Head: Normocephalic and atraumatic.  Eyes:     General: No scleral icterus.       Right eye: No discharge.        Left eye: No discharge.     Conjunctiva/sclera: Conjunctivae normal.  Cardiovascular:     Rate and Rhythm: Normal rate and regular rhythm.     Heart sounds: Normal heart sounds. No murmur heard.    No friction rub. No gallop.  Pulmonary:     Effort: Pulmonary effort is normal. No respiratory distress.     Breath sounds: Normal breath sounds. No stridor. No wheezing, rhonchi or rales.  Musculoskeletal:        General: Normal range of  motion.     Cervical back: Normal range of motion.  Skin:    General: Skin is warm and dry.  Neurological:     Mental Status: He is alert and oriented to person, place, and time. Mental status is at baseline.  Psychiatric:        Mood and Affect: Mood normal.        Behavior: Behavior normal.        Thought Content: Thought content normal.        Judgment: Judgment normal.    Diabetic Foot Exam - Simple   Simple Foot Form Diabetic Foot exam was performed with the following findings: Yes 10/09/2021  8:26 AM  Visual Inspection No deformities, no ulcerations, no other skin breakdown bilaterally: Yes Sensation Testing Intact to touch and monofilament testing bilaterally: Yes Pulse Check Posterior Tibialis and Dorsalis pulse intact bilaterally: Yes Comments     No results found for: "TSH" Lab Results  Component Value Date   WBC 8.3 07/17/2021   HGB 15.8 07/17/2021   HCT 48.0 07/17/2021   MCV 95 07/17/2021   PLT 229 07/17/2021   Lab Results  Component Value Date   NA 141 07/17/2021   K 4.6 07/17/2021   CO2 23 07/17/2021   GLUCOSE 106 (H) 07/17/2021   BUN 36 (H) 07/17/2021   CREATININE 1.44 (H) 07/17/2021   BILITOT 0.8 07/17/2021   ALKPHOS 86 07/17/2021   AST 19 07/17/2021   ALT 21 07/17/2021   PROT  7.3 07/17/2021   ALBUMIN 4.7 (H) 07/17/2021   CALCIUM 9.9 07/17/2021   ANIONGAP 12 01/25/2017   EGFR 48 (L) 07/17/2021   Lab Results  Component Value Date   CHOL 151 07/17/2021   Lab Results  Component Value Date   HDL 61 07/17/2021   Lab Results  Component Value Date   LDLCALC 74 07/17/2021   Lab Results  Component Value Date   TRIG 86 07/17/2021   Lab Results  Component Value Date   CHOLHDL 2.5 07/17/2021   Lab Results  Component Value Date   HGBA1C 7.5 (H) 07/17/2021

## 2021-10-10 LAB — CBC WITH DIFFERENTIAL/PLATELET
Basophils Absolute: 0.1 10*3/uL (ref 0.0–0.2)
Basos: 1 %
EOS (ABSOLUTE): 0.6 10*3/uL — ABNORMAL HIGH (ref 0.0–0.4)
Eos: 7 %
Hematocrit: 46.7 % (ref 37.5–51.0)
Hemoglobin: 15.3 g/dL (ref 13.0–17.7)
Immature Grans (Abs): 0 10*3/uL (ref 0.0–0.1)
Immature Granulocytes: 0 %
Lymphocytes Absolute: 2.2 10*3/uL (ref 0.7–3.1)
Lymphs: 25 %
MCH: 30.8 pg (ref 26.6–33.0)
MCHC: 32.8 g/dL (ref 31.5–35.7)
MCV: 94 fL (ref 79–97)
Monocytes Absolute: 1 10*3/uL — ABNORMAL HIGH (ref 0.1–0.9)
Monocytes: 12 %
Neutrophils Absolute: 4.9 10*3/uL (ref 1.4–7.0)
Neutrophils: 55 %
Platelets: 232 10*3/uL (ref 150–450)
RBC: 4.97 x10E6/uL (ref 4.14–5.80)
RDW: 13.1 % (ref 11.6–15.4)
WBC: 8.8 10*3/uL (ref 3.4–10.8)

## 2021-10-10 LAB — CMP14+EGFR
ALT: 17 IU/L (ref 0–44)
AST: 19 IU/L (ref 0–40)
Albumin/Globulin Ratio: 1.7 (ref 1.2–2.2)
Albumin: 4.6 g/dL (ref 3.7–4.7)
Alkaline Phosphatase: 93 IU/L (ref 44–121)
BUN/Creatinine Ratio: 20 (ref 10–24)
BUN: 28 mg/dL — ABNORMAL HIGH (ref 8–27)
Bilirubin Total: 0.9 mg/dL (ref 0.0–1.2)
CO2: 23 mmol/L (ref 20–29)
Calcium: 9.6 mg/dL (ref 8.6–10.2)
Chloride: 100 mmol/L (ref 96–106)
Creatinine, Ser: 1.39 mg/dL — ABNORMAL HIGH (ref 0.76–1.27)
Globulin, Total: 2.7 g/dL (ref 1.5–4.5)
Glucose: 119 mg/dL — ABNORMAL HIGH (ref 70–99)
Potassium: 4.4 mmol/L (ref 3.5–5.2)
Sodium: 141 mmol/L (ref 134–144)
Total Protein: 7.3 g/dL (ref 6.0–8.5)
eGFR: 50 mL/min/{1.73_m2} — ABNORMAL LOW (ref >59)

## 2021-10-10 LAB — LIPID PANEL
Chol/HDL Ratio: 2.6 ratio (ref 0.0–5.0)
Cholesterol, Total: 162 mg/dL (ref 100–199)
HDL: 63 mg/dL (ref >39)
LDL Chol Calc (NIH): 84 mg/dL (ref 0–99)
Triglycerides: 81 mg/dL (ref 0–149)
VLDL Cholesterol Cal: 15 mg/dL (ref 5–40)

## 2021-10-10 LAB — MICROALBUMIN / CREATININE URINE RATIO
Creatinine, Urine: 72.7 mg/dL
Microalb/Creat Ratio: 178 mg/g creat — ABNORMAL HIGH (ref 0–29)
Microalbumin, Urine: 129.5 ug/mL

## 2021-10-16 ENCOUNTER — Ambulatory Visit: Payer: Medicare Other | Admitting: Family Medicine

## 2021-10-20 DIAGNOSIS — R809 Proteinuria, unspecified: Secondary | ICD-10-CM | POA: Diagnosis not present

## 2021-10-20 DIAGNOSIS — I129 Hypertensive chronic kidney disease with stage 1 through stage 4 chronic kidney disease, or unspecified chronic kidney disease: Secondary | ICD-10-CM | POA: Diagnosis not present

## 2021-10-20 DIAGNOSIS — I5032 Chronic diastolic (congestive) heart failure: Secondary | ICD-10-CM | POA: Diagnosis not present

## 2021-10-20 DIAGNOSIS — E1122 Type 2 diabetes mellitus with diabetic chronic kidney disease: Secondary | ICD-10-CM | POA: Diagnosis not present

## 2021-10-20 DIAGNOSIS — N189 Chronic kidney disease, unspecified: Secondary | ICD-10-CM | POA: Diagnosis not present

## 2021-10-20 DIAGNOSIS — E1129 Type 2 diabetes mellitus with other diabetic kidney complication: Secondary | ICD-10-CM | POA: Diagnosis not present

## 2021-10-22 ENCOUNTER — Ambulatory Visit (INDEPENDENT_AMBULATORY_CARE_PROVIDER_SITE_OTHER): Payer: Medicare Other

## 2021-10-22 DIAGNOSIS — I5022 Chronic systolic (congestive) heart failure: Secondary | ICD-10-CM | POA: Diagnosis not present

## 2021-10-22 DIAGNOSIS — Z95810 Presence of automatic (implantable) cardiac defibrillator: Secondary | ICD-10-CM | POA: Diagnosis not present

## 2021-10-22 NOTE — Progress Notes (Signed)
EPIC Encounter for ICM Monitoring  Patient Name: Omar Williams is a 86 y.o. male Date: 10/22/2021 Primary Care Physican: Baruch Gouty, [place] Primary Cardiologist:  Harl Bowie Electrophysiologist: Lovena Le Nephrologist: [facility] Bi-V Pacing: 99.2% 08/12/2021 Weight: 161 lbs 09/17/2021 Weight: 162 lbs 10/22/2021 Weight: 161-162 lbs   Spoke with patient and heart failure questions reviewed.  Pt asymptomatic for fluid accumulation. He reports he has a cold but otherwise without complaints.     Optivol Thoracic impedance suggesting normal fluid levels with exception of possible fluid accumulation from 9/6-9/13.   Prescribed:  Furosemide 20 mg take 1 tablet as needed for swelling.  Pt reports 6/8 taking Furosemide daily instead of PRN.   Labs: 07/17/2021 Creatinine 1.44, BUN 36, Potassium 4.6, Sodium 141, GFR 48 04/15/2021 Creatinine 1.41, BUN 22, Potassium 4.5, Sodium 144, GFR 49 A complete set of results can be found in Results Review.   Recommendations:  Encouraged to call if experiencing any fluid symptoms.    Follow-up plan: ICM clinic phone appointment on 11/24/2021.   91 day device clinic remote transmission 01/07/2022.       EP/Cardiology Office Visits: 11/19/2021 with Dr Harl Bowie.  Recall 08/06/2022 with Dr Lovena Le.        Copy of ICM check sent to Dr. Lovena Le.    3 month ICM trend: 10/22/2021.    12-14 Month ICM trend:     Rosalene Billings, RN 10/22/2021 2:31 PM

## 2021-10-23 DIAGNOSIS — I5032 Chronic diastolic (congestive) heart failure: Secondary | ICD-10-CM | POA: Diagnosis not present

## 2021-10-23 DIAGNOSIS — E1122 Type 2 diabetes mellitus with diabetic chronic kidney disease: Secondary | ICD-10-CM | POA: Diagnosis not present

## 2021-10-23 DIAGNOSIS — R809 Proteinuria, unspecified: Secondary | ICD-10-CM | POA: Diagnosis not present

## 2021-10-23 DIAGNOSIS — E1129 Type 2 diabetes mellitus with other diabetic kidney complication: Secondary | ICD-10-CM | POA: Diagnosis not present

## 2021-10-23 DIAGNOSIS — N189 Chronic kidney disease, unspecified: Secondary | ICD-10-CM | POA: Diagnosis not present

## 2021-10-23 DIAGNOSIS — I129 Hypertensive chronic kidney disease with stage 1 through stage 4 chronic kidney disease, or unspecified chronic kidney disease: Secondary | ICD-10-CM | POA: Diagnosis not present

## 2021-10-27 NOTE — Progress Notes (Signed)
Remote ICD transmission.   

## 2021-11-10 ENCOUNTER — Telehealth: Payer: Self-pay | Admitting: Family Medicine

## 2021-11-10 DIAGNOSIS — I252 Old myocardial infarction: Secondary | ICD-10-CM

## 2021-11-10 DIAGNOSIS — E785 Hyperlipidemia, unspecified: Secondary | ICD-10-CM

## 2021-11-10 DIAGNOSIS — I25118 Atherosclerotic heart disease of native coronary artery with other forms of angina pectoris: Secondary | ICD-10-CM

## 2021-11-10 DIAGNOSIS — E1122 Type 2 diabetes mellitus with diabetic chronic kidney disease: Secondary | ICD-10-CM

## 2021-11-10 NOTE — Telephone Encounter (Signed)
  Prescription Request  11/10/2021  Is this a "Controlled Substance" medicine? no  Have you seen your PCP in the last 2 weeks? no  If YES, route message to pool  -  If NO, patient needs to be scheduled for appointment.  What is the name of the medication or equipment? Atorvastatin 80 mg  Have you contacted your pharmacy to request a refill? no   Which pharmacy would you [facility] this sent to? [facility]   Patient notified that their request is being sent to the clinical staff for review and that they should receive a response within 2 business days.

## 2021-11-10 NOTE — Telephone Encounter (Signed)
TC to pt, informed him at his 9/7 visit w/ Britney refill was sent in for 3 mos supply w/ a refill to [facility] [place]

## 2021-11-19 ENCOUNTER — Ambulatory Visit: Payer: Medicare Other | Attending: Cardiology | Admitting: Cardiology

## 2021-11-19 ENCOUNTER — Encounter: Payer: Self-pay | Admitting: Cardiology

## 2021-11-19 VITALS — BP 118/72 | HR 60 | Ht 67.0 in | Wt 163.4 lb

## 2021-11-19 DIAGNOSIS — I2510 Atherosclerotic heart disease of native coronary artery without angina pectoris: Secondary | ICD-10-CM | POA: Diagnosis not present

## 2021-11-19 DIAGNOSIS — I255 Ischemic cardiomyopathy: Secondary | ICD-10-CM | POA: Diagnosis not present

## 2021-11-19 DIAGNOSIS — E782 Mixed hyperlipidemia: Secondary | ICD-10-CM | POA: Diagnosis not present

## 2021-11-19 DIAGNOSIS — I10 Essential (primary) hypertension: Secondary | ICD-10-CM | POA: Diagnosis not present

## 2021-11-19 DIAGNOSIS — I5022 Chronic systolic (congestive) heart failure: Secondary | ICD-10-CM | POA: Diagnosis not present

## 2021-11-19 MED ORDER — ROSUVASTATIN CALCIUM 40 MG PO TABS: 40.0000 mg | ORAL_TABLET | Freq: Every day | ORAL | 6 refills | Status: DC

## 2021-11-19 NOTE — Patient Instructions (Signed)
Medication Instructions:  Stop  Atorvastatin (Lipitor) Begin Rosuvastatin 40mg  daily  Continue all other medications.     Labwork: none  Testing/Procedures: none  Follow-Up: 6 months   Any Other Special Instructions Will Be Listed Below (If Applicable).   If you need a refill on your cardiac medications before your next appointment, please call your pharmacy.

## 2021-11-19 NOTE — Progress Notes (Signed)
Clinical Summary Mr. Lubeck is a 86 y.o.male seen today for follow up of the following medical problems.    1. ICM/Chronic systolic HF/CAD - history of LAD disease without target for revasc   - has BiV AICD, 04/2021 normal device check - 06/2017 echo LVEF 40-45%.  - medical therapy has been limited by low bp's. Has had some prior issues with hyperkalemia in the past     - had been on coreg 6.25mg  bid and losartan 25mg  daily at 07/2020 visit. Somewhere betwee June and July meds changed and now just on lisinopril 2.5mg  daily. Im not clear on this history.  - takes lasix 20mg  daily   Jan 2023 echo: LVEF 50%, grade I dd - he is on lisinopril, jardiance  - no chest pain, no SOB/DOE, no recent LE edema - compliant with meds - 10/2021 normal device check    2. HTN - compliant with meds     3. Hyperlipidemia - he is on atorva 80mg  and zetia 10mg     04/2021 TC 133 TG 78 HDL 57 LDL 61 - 10/2021 TC 162 TG 81 HDL 63 LDL 84   4. CKD - followed by Dr Theador Hawthorne Past Medical History:  Diagnosis Date   Acid reflux    Blood transfusion without reported diagnosis    Cardiac arrest [facility])    a. occuring in 12/2016. LAD disease but no targets for CABG or PCI. Underwent ICD placement as EF 15-20%.    CHF (congestive heart failure) (HCC)    Coronary artery disease    Diabetes mellitus without complication (HCC)    Diabetic nephropathy (HCC)    Hyperlipidemia    Hypertension    Myocardial infarction [facility])    Renal disorder      No Known Allergies   Current Outpatient Medications  Medication Sig Dispense Refill   aspirin EC 81 MG tablet Take 81 mg by mouth daily.     atorvastatin (LIPITOR) 80 MG tablet Take 1 tablet (80 mg total) by mouth daily. 90 tablet 1   Bee Pollen 550 MG CAPS Take 2 capsules by mouth 2 (two) times daily.     chlorhexidine (PERIDEX) 0.12 % solution as needed.     Cholecalciferol (VITAMIN D3) 125 MCG (5000 UT) TABS Take 1 tablet by mouth daily.      CINNAMON PO  Take 1,000 mg by mouth in the morning and at bedtime.     dapagliflozin propanediol (FARXIGA) 10 MG TABS tablet Take 1 tablet (10 mg total) by mouth daily before breakfast. 21 tablet 0   docusate sodium (COLACE) 50 MG capsule Take 100 mg by mouth 2 (two) times daily.     ezetimibe (ZETIA) 10 MG tablet TAKE 1 TABLET BY MOUTH EVERY DAY 90 tablet 1   finasteride (PROSCAR) 5 MG tablet TAKE 1 TABLET BY MOUTH EVERYDAY AT BEDTIME 90 tablet 3   furosemide (LASIX) 20 MG tablet Take 1 tablet (20 mg total) by mouth daily as needed for edema. 90 tablet 1   glimepiride (AMARYL) 4 MG tablet TAKE 1 TABLET (4 MG TOTAL) BY MOUTH IN THE MORNING AND AT BEDTIME. (Patient taking differently: Take 2 mg by mouth at bedtime.) 180 tablet 0   glucose blood (ONETOUCH ULTRA) test strip Test BS daily Dx E11.9 100 strip 3   lisinopril (ZESTRIL) 2.5 MG tablet Take 1 tablet (2.5 mg total) by mouth daily. 90 tablet 1   nitroGLYCERIN (NITROSTAT) 0.4 MG SL tablet Place 1 tablet (0.4  mg total) under the tongue every 5 (five) minutes x 3 doses as needed for chest pain. 25 tablet 3   Omega-3 Fatty Acids (FISH OIL) 1200 MG CAPS Take 1 capsule by mouth 2 (two) times daily.      oxybutynin (DITROPAN) 5 MG tablet Take 0.5 tablets (2.5 mg total) by mouth 3 (three) times daily. 135 each 3   pantoprazole (PROTONIX) 40 MG tablet Take 1 tablet (40 mg total) by mouth daily. 90 tablet 1   tamsulosin (FLOMAX) 0.4 MG CAPS capsule TAKE 1 CAPSULE (0.4 MG TOTAL) BY MOUTH EVERY MORNING. 90 capsule 3   TURMERIC PO Take 1,000 mg by mouth 2 (two) times daily.     vitamin C (ASCORBIC ACID) 500 MG tablet Take 1,000 mg by mouth 2 (two) times daily.     No current facility-administered medications for this visit.     Past Surgical History:  Procedure Laterality Date   BIV ICD INSERTION CRT-D N/A 01/01/2017   Procedure: BIV ICD INSERTION CRT-D;  Surgeon: Constance Haw, MD;  Location: [place] [facility];  Service: Cardiovascular;  Laterality: N/A;      No Known Allergies    Family History  Problem Relation Age of Onset   Hypertension Mother    Hyperlipidemia Mother    CAD Sister    Diabetes Sister    Hyperlipidemia Sister    Hypertension Sister    CAD Brother    Diabetes Brother    Hypertension Brother    Hyperlipidemia Brother    CAD Brother    Diabetes Brother    Hypertension Brother    Hyperlipidemia Brother    Dementia Brother    Thyroid disease Daughter    Hypertension Daughter    Diabetes Daughter    Hyperlipidemia Daughter    Fibromyalgia Daughter    Heart disease Daughter        stents   Multiple sclerosis Daughter    Thyroid disease Daughter      Social History Mr. Krizan reports that he quit smoking about 63 years ago. His smoking use included cigarettes. He has never used smokeless tobacco. Mr. Yeoman reports no history of alcohol use.   Review of Systems CONSTITUTIONAL: No weight loss, fever, chills, weakness or fatigue.  HEENT: Eyes: No visual loss, blurred vision, double vision or yellow sclerae.No hearing loss, sneezing, congestion, runny nose or sore throat.  SKIN: No rash or itching.  CARDIOVASCULAR: per hpi RESPIRATORY: No shortness of breath, cough or sputum.  GASTROINTESTINAL: No anorexia, nausea, vomiting or diarrhea. No abdominal pain or blood.  GENITOURINARY: No burning on urination, no polyuria NEUROLOGICAL: No headache, dizziness, syncope, paralysis, ataxia, numbness or tingling in the extremities. No change in bowel or bladder control.  MUSCULOSKELETAL: No muscle, back pain, joint pain or stiffness.  LYMPHATICS: No enlarged nodes. No history of splenectomy.  PSYCHIATRIC: No history of depression or anxiety.  ENDOCRINOLOGIC: No reports of sweating, cold or heat intolerance. No polyuria or polydipsia.  Marland Kitchen   Physical Examination Today's Vitals   11/19/21 0810  BP: 118/72  Pulse: 60  SpO2: 95%  Weight: 163 lb 6.4 oz (74.1 kg)  Height: 5\' 7"  (1.702 m)   Body mass index is 25.59  kg/m.  Gen: resting comfortably, no acute distress HEENT: no scleral icterus, pupils equal round and reactive, no palptable cervical adenopathy,  CV: RRR, no mrg, no jvd Resp: Clear to auscultation bilaterally GI: abdomen is soft, non-tender, non-distended, normal bowel sounds, no hepatosplenomegaly MSK: extremities are warm,  no edema.  Skin: warm, no rash Neuro:  no focal deficits Psych: appropriate affect   Diagnostic Studies  Echo 06/09/17:   Study Conclusions   - Left ventricle: The cavity size was normal. Wall thickness was   normal. Systolic function was mildly to moderately reduced. The   estimated ejection fraction was in the range of 40% to 45%.   Diffuse hypokinesis. Doppler parameters are consistent with   abnormal left ventricular relaxation (grade 1 diastolic   dysfunction). - Aortic valve: Poorly visualized. Probably trileaflet. There was   mild regurgitation. - Mitral valve: There was trivial regurgitation. - Left atrium: The atrium was mildly dilated. - Right ventricle: Pacer wire or catheter noted in right ventricle. - Right atrium: Central venous pressure (est): 3 mm Hg. - Atrial septum: No defect or patent foramen ovale was identified. - Tricuspid valve: There was trivial regurgitation. - Pulmonary arteries: PA peak pressure: 13 mm Hg (S). - Pericardium, extracardiac: There was no pericardial effusion.   Impressions:   - Images are limited, but LVEF has improved in comparison to the   previous study in November 2018.   06/2017 echo Study Conclusions   - Left ventricle: The cavity size was normal. Wall thickness was    normal. Systolic function was mildly to moderately reduced. The    estimated ejection fraction was in the range of 40% to 45%.    Diffuse hypokinesis. Doppler parameters are consistent with    abnormal left ventricular relaxation (grade 1 diastolic    dysfunction).  - Aortic valve: Poorly visualized. Probably trileaflet. There was     mild regurgitation.  - Mitral valve: There was trivial regurgitation.  - Left atrium: The atrium was mildly dilated.  - Right ventricle: Pacer wire or catheter noted in right ventricle.  - Right atrium: Central venous pressure (est): 3 mm Hg.  - Atrial septum: No defect or patent foramen ovale was identified.  - Tricuspid valve: There was trivial regurgitation.  - Pulmonary arteries: PA peak pressure: 13 mm Hg (S).  - Pericardium, extracardiac: There was no pericardial effusion.   Jan 2023 echo 1. Left ventricular ejection fraction, by estimation, is 50%. The left  ventricle has low normal function. The left ventricle has no regional wall  motion abnormalities. There is moderate left ventricular hypertrophy. Left  ventricular diastolic parameters   are consistent with Grade I diastolic dysfunction (impaired relaxation).   2. Right ventricular systolic function is normal. The right ventricular  size is normal. There is normal pulmonary artery systolic pressure.   3. Left atrial size was moderately dilated.   4. The mitral valve is normal in structure. No evidence of mitral valve  regurgitation. No evidence of mitral stenosis.   5. The tricuspid valve is abnormal.   6. The aortic valve was not well visualized. Aortic valve regurgitation  is mild.   7. The inferior vena cava is normal in size with greater than 50%  respiratory variability, suggesting right atrial pressure of 3 mmHg.    Assessment and Plan   1. CAD/Chronic systolic HF/ICM - most recent echo shows LVEF has normalized at 50% - no symptoms, continue current meds. At some point beta blocker was stopped by another provider, I assume due to low HRs as resting HR today is 60.    2. Hyperlipidemia -LDL above goal, change atorvastatin to rosuvastatin 40mg  daily, continue zetia 10mg  daily.    3. HTN - at goal, continue current meds     Alphonse Guild.  Harl Bowie, M.D.

## 2021-11-24 ENCOUNTER — Ambulatory Visit (INDEPENDENT_AMBULATORY_CARE_PROVIDER_SITE_OTHER): Payer: Medicare Other

## 2021-11-24 DIAGNOSIS — Z95810 Presence of automatic (implantable) cardiac defibrillator: Secondary | ICD-10-CM | POA: Diagnosis not present

## 2021-11-24 DIAGNOSIS — I5022 Chronic systolic (congestive) heart failure: Secondary | ICD-10-CM | POA: Diagnosis not present

## 2021-11-25 NOTE — Progress Notes (Signed)
EPIC Encounter for ICM Monitoring  Patient Name: Omar Williams is a 86 y.o. male Date: 11/25/2021 Primary Care Physican: Baruch Gouty, [place] Primary Cardiologist:  Harl Bowie Electrophysiologist: Lovena Le Nephrologist: [facility] Bi-V Pacing: 98.8% 08/12/2021 Weight: 161 lbs 09/17/2021 Weight: 162 lbs 10/22/2021 Weight: 161-162 lbs   Spoke with patient and heart failure questions reviewed.  Pt asymptomatic for fluid accumulation. Wife had a heart attack and waiting for bed at [place] Thoracic impedance suggesting normal fluid levels.   Prescribed:  Furosemide 20 mg take 1 tablet as needed for swelling.  Pt reports 6/8 taking Furosemide daily instead of PRN.   Labs: 07/17/2021 Creatinine 1.44, BUN 36, Potassium 4.6, Sodium 141, GFR 48 04/15/2021 Creatinine 1.41, BUN 22, Potassium 4.5, Sodium 144, GFR 49 A complete set of results can be found in Results Review.   Recommendations:  Encouraged to call if experiencing any fluid symptoms.    Follow-up plan: ICM clinic phone appointment on 12/31/2021.   91 day device clinic remote transmission 01/07/2022.       EP/Cardiology Office Visits: 05/26/2022 with Dr Harl Bowie.  Recall 08/06/2022 with Dr Lovena Le.        Copy of ICM check sent to Dr. Lovena Le.    3 month ICM trend: 11/25/2021.    12-14 Month ICM trend:     Rosalene Billings, RN 11/25/2021 12:16 PM

## 2021-12-02 DIAGNOSIS — Z23 Encounter for immunization: Secondary | ICD-10-CM | POA: Diagnosis not present

## 2021-12-31 ENCOUNTER — Ambulatory Visit (INDEPENDENT_AMBULATORY_CARE_PROVIDER_SITE_OTHER): Payer: Medicare Other

## 2021-12-31 DIAGNOSIS — Z95810 Presence of automatic (implantable) cardiac defibrillator: Secondary | ICD-10-CM | POA: Diagnosis not present

## 2021-12-31 DIAGNOSIS — I5022 Chronic systolic (congestive) heart failure: Secondary | ICD-10-CM | POA: Diagnosis not present

## 2021-12-31 NOTE — Progress Notes (Signed)
EPIC Encounter for ICM Monitoring  Patient Name: Omar Williams is a 86 y.o. male Date: 12/31/2021 Primary Care Physican: Sonny Masters, FNP Primary Cardiologist:  Wyline Mood Electrophysiologist: Ladona Ridgel Nephrologist: [facility] Bi-V Pacing: 98.5% 08/12/2021 Weight: 161 lbs 09/17/2021 Weight: 162 lbs 10/22/2021 Weight: 161-162 lbs   Spoke with patient and heart failure questions reviewed.  Pt asymptomatic for fluid accumulation.  He has been traveling for past 8 days and heading home tomorrow.  It has been difficult to follow low salt diet while traveling.   [facility] Thoracic impedance suggesting possible fluid accumulation starting 11/22.   Prescribed:  Furosemide 20 mg take 1 tablet as needed for swelling.     Labs: 10/09/2021 Creatinine 1.39, BUN 28, Potassium 4.4, Sodium 141, GFR 50 07/17/2021 Creatinine 1.44, BUN 36, Potassium 4.6, Sodium 141, GFR 48 04/15/2021 Creatinine 1.41, BUN 22, Potassium 4.5, Sodium 144, GFR 49 A complete set of results can be found in Results Review.   Recommendations:  Advised to return to low salt diet after traveling.  Encouraged to call if experiencing any fluid symptoms.    Follow-up plan: ICM clinic phone appointment on 01/07/2022 to recheck.   91 day device clinic remote transmission 01/07/2022.       EP/Cardiology Office Visits: 05/26/2022 with Dr Wyline Mood.  Recall 08/06/2022 with Dr Ladona Ridgel.        Copy of ICM check sent to Dr. Ladona Ridgel and Dr Wyline Mood as Lorain Childes.     3 month ICM trend: 12/31/2021.    12-14 Month ICM trend:      Karie Soda, RN 12/31/2021 1:04 PM

## 2022-01-04 ENCOUNTER — Other Ambulatory Visit: Payer: Self-pay | Admitting: Cardiology

## 2022-01-07 ENCOUNTER — Ambulatory Visit (INDEPENDENT_AMBULATORY_CARE_PROVIDER_SITE_OTHER): Payer: Medicare Other

## 2022-01-07 ENCOUNTER — Telehealth: Payer: Self-pay

## 2022-01-07 DIAGNOSIS — I255 Ischemic cardiomyopathy: Secondary | ICD-10-CM | POA: Diagnosis not present

## 2022-01-07 LAB — CUP PACEART REMOTE DEVICE CHECK
Battery Remaining Longevity: 26 mo
Battery Voltage: 2.94 V
Brady Statistic AP VP Percent: 38.92 %
Brady Statistic AP VS Percent: 0.03 %
Brady Statistic AS VP Percent: 59.88 %
Brady Statistic AS VS Percent: 1.17 %
Brady Statistic RA Percent Paced: 38.89 %
Brady Statistic RV Percent Paced: 91.06 %
Date Time Interrogation Session: 20231206012303
HighPow Impedance: 80 Ohm
Implantable Lead Connection Status: 753985
Implantable Lead Connection Status: 753985
Implantable Lead Connection Status: 753985
Implantable Lead Implant Date: 20181130
Implantable Lead Implant Date: 20181130
Implantable Lead Implant Date: 20181130
Implantable Lead Location: 753858
Implantable Lead Location: 753859
Implantable Lead Location: 753860
Implantable Lead Model: 4598
Implantable Lead Model: 5076
Implantable Lead Model: 6935
Implantable Pulse Generator Implant Date: 20181130
Lead Channel Impedance Value: 141.867
Lead Channel Impedance Value: 149.625
Lead Channel Impedance Value: 152 Ohm
Lead Channel Impedance Value: 160.941
Lead Channel Impedance Value: 160.941
Lead Channel Impedance Value: 266 Ohm
Lead Channel Impedance Value: 304 Ohm
Lead Channel Impedance Value: 304 Ohm
Lead Channel Impedance Value: 342 Ohm
Lead Channel Impedance Value: 342 Ohm
Lead Channel Impedance Value: 380 Ohm
Lead Channel Impedance Value: 494 Ohm
Lead Channel Impedance Value: 513 Ohm
Lead Channel Impedance Value: 513 Ohm
Lead Channel Impedance Value: 513 Ohm
Lead Channel Impedance Value: 532 Ohm
Lead Channel Impedance Value: 589 Ohm
Lead Channel Impedance Value: 646 Ohm
Lead Channel Pacing Threshold Amplitude: 0.375 V
Lead Channel Pacing Threshold Amplitude: 0.75 V
Lead Channel Pacing Threshold Amplitude: 1.375 V
Lead Channel Pacing Threshold Pulse Width: 0.4 ms
Lead Channel Pacing Threshold Pulse Width: 0.4 ms
Lead Channel Pacing Threshold Pulse Width: 0.4 ms
Lead Channel Sensing Intrinsic Amplitude: 2.875 mV
Lead Channel Sensing Intrinsic Amplitude: 2.875 mV
Lead Channel Sensing Intrinsic Amplitude: 8.375 mV
Lead Channel Sensing Intrinsic Amplitude: 8.375 mV
Lead Channel Setting Pacing Amplitude: 2 V
Lead Channel Setting Pacing Amplitude: 2 V
Lead Channel Setting Pacing Amplitude: 2.5 V
Lead Channel Setting Pacing Pulse Width: 0.4 ms
Lead Channel Setting Pacing Pulse Width: 0.4 ms
Lead Channel Setting Sensing Sensitivity: 0.3 mV
Zone Setting Status: 755011
Zone Setting Status: 755011

## 2022-01-07 NOTE — Telephone Encounter (Signed)
Returned patient call as requested regarding remote transmission results.  See ICM note for results.

## 2022-01-09 ENCOUNTER — Ambulatory Visit (INDEPENDENT_AMBULATORY_CARE_PROVIDER_SITE_OTHER): Payer: Medicare Other

## 2022-01-09 DIAGNOSIS — I5022 Chronic systolic (congestive) heart failure: Secondary | ICD-10-CM

## 2022-01-09 DIAGNOSIS — Z95810 Presence of automatic (implantable) cardiac defibrillator: Secondary | ICD-10-CM

## 2022-01-09 NOTE — Progress Notes (Signed)
EPIC Encounter for ICM Monitoring  Patient Name: Omar Williams is a 86 y.o. male Date: 01/09/2022 Primary Care Physican: Sonny Masters, FNP Primary Cardiologist:  Wyline Mood Electrophysiologist: Ladona Ridgel Nephrologist: [facility] Bi-V Pacing: 98.4% 08/12/2021 Weight: 161 lbs 09/17/2021 Weight: 162 lbs 10/22/2021 Weight: 161-162 lbs   Spoke with patient on 12/6 (late entry) and heart failure questions reviewed.  Pt asymptomatic for fluid accumulation.    Optivol Thoracic impedance suggesting fluid levels returned to normal.   Prescribed:  Furosemide 20 mg take 1 tablet as needed for swelling.     Labs: 10/09/2021 Creatinine 1.39, BUN 28, Potassium 4.4, Sodium 141, GFR 50 07/17/2021 Creatinine 1.44, BUN 36, Potassium 4.6, Sodium 141, GFR 48 04/15/2021 Creatinine 1.41, BUN 22, Potassium 4.5, Sodium 144, GFR 49 A complete set of results can be found in Results Review.   Recommendations:   Encouraged to call if experiencing any fluid symptoms.    Follow-up plan: ICM clinic phone appointment on 02/16/2022.   91 day device clinic remote transmission 04/08/2022.       EP/Cardiology Office Visits: 05/26/2022 with Dr Wyline Mood.  Recall 08/06/2022 with Dr Ladona Ridgel.        Copy of ICM check sent to Dr. Ladona Ridgel.   3 month ICM trend: 01/07/2022.    12-14 Month ICM trend:     Karie Soda, RN 01/09/2022 8:19 AM

## 2022-01-20 ENCOUNTER — Ambulatory Visit: Payer: Medicare Other | Admitting: Family Medicine

## 2022-01-27 ENCOUNTER — Ambulatory Visit (INDEPENDENT_AMBULATORY_CARE_PROVIDER_SITE_OTHER): Payer: Medicare Other | Admitting: Family Medicine

## 2022-01-27 ENCOUNTER — Encounter: Payer: Self-pay | Admitting: Family Medicine

## 2022-01-27 VITALS — BP 127/71 | HR 72 | Temp 97.4°F | Ht 67.0 in | Wt 163.0 lb

## 2022-01-27 DIAGNOSIS — N183 Chronic kidney disease, stage 3 unspecified: Secondary | ICD-10-CM | POA: Diagnosis not present

## 2022-01-27 DIAGNOSIS — I10 Essential (primary) hypertension: Secondary | ICD-10-CM | POA: Diagnosis not present

## 2022-01-27 DIAGNOSIS — N3001 Acute cystitis with hematuria: Secondary | ICD-10-CM | POA: Diagnosis not present

## 2022-01-27 DIAGNOSIS — R399 Unspecified symptoms and signs involving the genitourinary system: Secondary | ICD-10-CM

## 2022-01-27 DIAGNOSIS — E1169 Type 2 diabetes mellitus with other specified complication: Secondary | ICD-10-CM | POA: Diagnosis not present

## 2022-01-27 DIAGNOSIS — I152 Hypertension secondary to endocrine disorders: Secondary | ICD-10-CM | POA: Diagnosis not present

## 2022-01-27 DIAGNOSIS — N1832 Chronic kidney disease, stage 3b: Secondary | ICD-10-CM | POA: Diagnosis not present

## 2022-01-27 DIAGNOSIS — E1159 Type 2 diabetes mellitus with other circulatory complications: Secondary | ICD-10-CM | POA: Diagnosis not present

## 2022-01-27 DIAGNOSIS — E1122 Type 2 diabetes mellitus with diabetic chronic kidney disease: Secondary | ICD-10-CM

## 2022-01-27 DIAGNOSIS — E785 Hyperlipidemia, unspecified: Secondary | ICD-10-CM | POA: Diagnosis not present

## 2022-01-27 LAB — URINALYSIS
Bilirubin, UA: NEGATIVE
Ketones, UA: NEGATIVE
Nitrite, UA: NEGATIVE
Specific Gravity, UA: 1.015 (ref 1.005–1.030)
Urobilinogen, Ur: 0.2 mg/dL (ref 0.2–1.0)
pH, UA: 5.5 (ref 5.0–7.5)

## 2022-01-27 LAB — BAYER DCA HB A1C WAIVED: HB A1C (BAYER DCA - WAIVED): 7.3 % — ABNORMAL HIGH (ref 4.8–5.6)

## 2022-01-27 MED ORDER — SULFAMETHOXAZOLE-TRIMETHOPRIM 800-160 MG PO TABS: 1.0000 | ORAL_TABLET | Freq: Two times a day (BID) | ORAL | 0 refills | Status: AC

## 2022-01-27 NOTE — Progress Notes (Signed)
Subjective:  Patient ID: Omar Williams, male    DOB: 12/07/1935, 86 y.o.   MRN: [id number]  Patient Care Team: Baruch Gouty, FNP as PCP - General (Family Medicine) Thompson Grayer, MD as PCP - Electrophysiology (Cardiology) Harl Bowie, Alphonse Guild, MD as PCP - Cardiology (Cardiology) Shirl Harris, OD (Optometry) Verta Ellen., NP (Inactive) as Nurse Practitioner (Cardiology) Liana Gerold, MD as Consulting Physician (Nephrology) Irine Seal, MD as Attending Physician (Urology) Aviva Signs, MD as Consulting Physician (General Surgery) Lavera Guise, [facility] as Pharmacist (Family Medicine)   Chief Complaint:  Medical Management of Chronic Issues, Diabetes, Hyperlipidemia, Hypertension, and Chronic Kidney Disease   HPI: Omar Williams is a 86 y.o. male presenting on 01/27/2022 for Medical Management of Chronic Issues, Diabetes, Hyperlipidemia, Hypertension, and Chronic Kidney Disease   Pt presents today to establish care with new PCP and for management of chronic medical conditions. He also reports dysuria.   Diabetes He presents for his follow-up diabetic visit. He has type 2 diabetes mellitus. His disease course has been stable. Pertinent negatives for hypoglycemia include no confusion, dizziness, headaches, hunger, mood changes, nervousness/anxiousness, pallor, seizures, sleepiness, speech difficulty, sweats or tremors. Associated symptoms include polyphagia. Pertinent negatives for diabetes include no blurred vision, no chest pain, no fatigue, no foot paresthesias, no foot ulcerations, no polydipsia, no polyuria, no visual change, no weakness and no weight loss. Symptoms are stable. Diabetic complications include heart disease, nephropathy and PVD. Risk factors for coronary artery disease include diabetes mellitus, dyslipidemia, hypertension, male sex and sedentary lifestyle. Current diabetic treatments: Farxiga, Amaryl. He is compliant with treatment all of the time. His weight is  stable. He is following a diabetic diet. There is no change in his home blood glucose trend. An ACE inhibitor/angiotensin II receptor blocker is being taken. Eye exam is current.  Hyperlipidemia This is a chronic problem. The problem is controlled. Exacerbating diseases include chronic renal disease, diabetes and hypothyroidism. Pertinent negatives include no chest pain, focal sensory loss, focal weakness, leg pain, myalgias or shortness of breath. Current antihyperlipidemic treatment includes statins and diet change. The current treatment provides significant improvement of lipids. Risk factors for coronary artery disease include diabetes mellitus, dyslipidemia, hypertension, male sex and a sedentary lifestyle.  Hypertension This is a chronic problem. The problem is unchanged. The problem is controlled. Pertinent negatives include no anxiety, blurred vision, chest pain, headaches, malaise/fatigue, neck pain, orthopnea, palpitations, peripheral edema, PND, shortness of breath or sweats. Risk factors for coronary artery disease include diabetes mellitus, dyslipidemia, male gender and sedentary lifestyle. Past treatments include ACE inhibitors. The current treatment provides significant improvement. There are no compliance problems.  Hypertensive end-organ damage includes kidney disease, CAD/MI, heart failure and PVD. Identifiable causes of hypertension include chronic renal disease.  Dysuria  This is a new problem. Episode onset: 3-4 days ago. The problem occurs every urination. The quality of the pain is described as burning. The pain is mild. There has been no fever. He is Not sexually active. There is No history of pyelonephritis. Associated symptoms include urgency. Pertinent negatives include no chills, discharge, flank pain, frequency, hematuria, hesitancy, nausea, possible pregnancy, sweats or vomiting. He has tried increased fluids for the symptoms. The treatment provided no relief.    Relevant past  medical, surgical, family, and social history reviewed and updated as indicated.  Allergies and medications reviewed and updated. Data reviewed: Chart in Epic.   Past Medical History:  Diagnosis Date   Acid reflux  Blood transfusion without reported diagnosis    Cardiac arrest ([place])    a. occuring in 12/2016. LAD disease but no targets for CABG or PCI. Underwent ICD placement as EF 15-20%.    CHF (congestive heart failure) (HCC)    Coronary artery disease    Diabetes mellitus without complication ([place])    Diabetic nephropathy ([place])    Hyperlipidemia    Hypertension    Myocardial infarction [facility])    Renal disorder     Past Surgical History:  Procedure Laterality Date   BIV ICD INSERTION CRT-D N/A 01/01/2017   Procedure: BIV ICD INSERTION CRT-D;  Surgeon: Constance Haw, MD;  Location: [place];  Service: Cardiovascular;  Laterality: N/A;    Social History   Socioeconomic History   Marital status: Married    Spouse name: johnna   Number of children: 3   Years of education: 12   Highest education level: Some college, no degree  Occupational History   Occupation: Radiographer, therapeutic   retired   Occupation: Theme park manager  Tobacco Use   Smoking status: Former    Types: Cigarettes    Quit date: 1960    Years since quitting: 64.0    Passive exposure: Never   Smokeless tobacco: Never   Tobacco comments:    2-3 years as a teenager  Scientific laboratory technician Use: Never used  Substance and Sexual Activity   Alcohol use: No   Drug use: No   Sexual activity: Not Currently  Other Topics Concern   Not on file  Social History Narrative   Lives with with wife Omar Williams   Married 47 years   Daughter Omar Williams stays frequently   Granddaughter Omar Williams near by   Social Determinants of Health   Financial Resource Strain: Low Risk  (06/16/2021)   Overall Financial Resource Strain (CARDIA)    Difficulty of Paying Living Expenses: Not hard at all  Food Insecurity: No Food  Insecurity (06/16/2021)   Hunger Vital Sign    Worried About Running Out of Food in the Last Year: Never true    [place] in the Last Year: Never true  Transportation Needs: No Transportation Needs (06/16/2021)   PRAPARE - Hydrologist (Medical): No    Lack of Transportation (Non-Medical): No  Physical Activity: Sufficiently Active (06/16/2021)   Exercise Vital Sign    Days of Exercise per Week: 5 days    Minutes of Exercise per Session: 30 min  Stress: No Stress Concern Present (06/16/2021)   [place]    Feeling of Stress : Not at all  Social Connections: [place] (06/16/2021)   Social Connection and Isolation Panel [NHANES]    Frequency of Communication with Friends and Family: More than three times a week    Frequency of Social Gatherings with Friends and Family: More than three times a week    Attends Religious Services: More than 4 times per year    Active Member of [employer] or Organizations: Yes    Attends Archivist Meetings: More than 4 times per year    Marital Status: Married  Human resources officer Violence: Not At Risk (06/16/2021)   Humiliation, Afraid, Rape, and Kick questionnaire    Fear of Current or Ex-Partner: No    Emotionally Abused: No    Physically Abused: No    Sexually Abused: No    Outpatient Encounter Medications as of 01/27/2022  Medication Sig   Bee Pollen 550 MG CAPS Take 2 capsules by mouth 2 (two) times daily.   chlorhexidine (PERIDEX) 0.12 % solution as needed.   Cholecalciferol (VITAMIN D3) 125 MCG (5000 UT) TABS Take 1 tablet by mouth daily.    CINNAMON PO Take 1,000 mg by mouth in the morning and at bedtime.   dapagliflozin propanediol (FARXIGA) 10 MG TABS tablet Take 1 tablet (10 mg total) by mouth daily before breakfast.   docusate sodium (COLACE) 50 MG capsule Take 100 mg by mouth 2 (two) times daily.   ezetimibe (ZETIA) 10 MG tablet  TAKE 1 TABLET BY MOUTH EVERY DAY   finasteride (PROSCAR) 5 MG tablet TAKE 1 TABLET BY MOUTH EVERYDAY AT BEDTIME   furosemide (LASIX) 20 MG tablet Take 1 tablet (20 mg total) by mouth daily as needed for edema.   glimepiride (AMARYL) 4 MG tablet TAKE 1 TABLET (4 MG TOTAL) BY MOUTH IN THE MORNING AND AT BEDTIME. (Patient taking differently: Take 2 mg by mouth at bedtime.)   glucose blood (ONETOUCH ULTRA) test strip Test BS daily Dx E11.9   lisinopril (ZESTRIL) 2.5 MG tablet Take 1 tablet (2.5 mg total) by mouth daily.   nitroGLYCERIN (NITROSTAT) 0.4 MG SL tablet Place 1 tablet (0.4 mg total) under the tongue every 5 (five) minutes x 3 doses as needed for chest pain.   Omega-3 Fatty Acids (FISH OIL) 1200 MG CAPS Take 1 capsule by mouth 2 (two) times daily.    oxybutynin (DITROPAN) 5 MG tablet Take 0.5 tablets (2.5 mg total) by mouth 3 (three) times daily.   pantoprazole (PROTONIX) 40 MG tablet Take 1 tablet (40 mg total) by mouth daily.   rosuvastatin (CRESTOR) 40 MG tablet Take 1 tablet (40 mg total) by mouth daily.   sulfamethoxazole-trimethoprim (BACTRIM DS) 800-160 MG tablet Take 1 tablet by mouth 2 (two) times daily for 5 days.   tamsulosin (FLOMAX) 0.4 MG CAPS capsule TAKE 1 CAPSULE (0.4 MG TOTAL) BY MOUTH EVERY MORNING.   TURMERIC PO Take 1,000 mg by mouth 2 (two) times daily.   vitamin C (ASCORBIC ACID) 500 MG tablet Take 1,000 mg by mouth 2 (two) times daily.   [DISCONTINUED] aspirin EC 81 MG tablet Take 81 mg by mouth daily. (Patient not taking: Reported on 01/27/2022)   No facility-administered encounter medications on file as of 01/27/2022.    No Known Allergies  Review of Systems  Constitutional:  Negative for activity change, appetite change, chills, diaphoresis, fatigue, fever, malaise/fatigue, unexpected weight change and weight loss.  HENT: Negative.    Eyes: Negative.  Negative for blurred vision, photophobia, pain, discharge, redness, itching and visual disturbance.   Respiratory:  Negative for cough, chest tightness and shortness of breath.   Cardiovascular:  Negative for chest pain, palpitations, orthopnea, leg swelling and PND.  Gastrointestinal:  Negative for abdominal pain, blood in stool, constipation, diarrhea, nausea and vomiting.  Endocrine: Positive for polyphagia. Negative for polydipsia and polyuria.  Genitourinary:  Positive for dysuria and urgency. Negative for decreased urine volume, difficulty urinating, enuresis, flank pain, frequency, genital sores, hematuria, hesitancy, penile discharge, penile pain, penile swelling, scrotal swelling and testicular pain.  Musculoskeletal:  Negative for arthralgias, myalgias and neck pain.  Skin: Negative.  Negative for pallor.  Allergic/Immunologic: Negative.   Neurological:  Negative for dizziness, tremors, focal weakness, seizures, syncope, facial asymmetry, speech difficulty, weakness, light-headedness, numbness and headaches.  Hematological: Negative.   Psychiatric/Behavioral:  Negative for confusion, hallucinations, sleep disturbance and suicidal ideas.  The patient is not nervous/anxious.   All other systems reviewed and are negative.       Objective:  BP 127/71   Pulse 72   Temp (!) 97.4 F (36.3 C)   Ht _0  (1.702 m)   Wt 163 lb (73.9 kg)   SpO2 96%   BMI 25.53 kg/m    Wt Readings from Last 3 Encounters:  01/27/22 163 lb (73.9 kg)  11/19/21 163 lb 6.4 oz (74.1 kg)  10/09/21 162 lb (73.5 kg)    Physical Exam Vitals and nursing note reviewed.  Constitutional:      General: He is not in acute distress.    Appearance: Normal appearance. He is well-developed, well-groomed and normal weight. He is not ill-appearing, toxic-appearing or diaphoretic.  HENT:     Head: Normocephalic and atraumatic.     Jaw: There is normal jaw occlusion.     Right Ear: Hearing normal.     Left Ear: Hearing normal.     Nose: Nose normal.     Mouth/Throat:     Lips: Pink.     Mouth: Mucous membranes  are moist.     Pharynx: Oropharynx is clear. Uvula midline.  Eyes:     General: Lids are normal.     Extraocular Movements: Extraocular movements intact.     Conjunctiva/sclera: Conjunctivae normal.     Pupils: Pupils are equal, round, and reactive to light.  Neck:     Thyroid: No thyroid mass, thyromegaly or thyroid tenderness.     Vascular: No carotid bruit or JVD.     Trachea: Trachea and phonation normal.  Cardiovascular:     Rate and Rhythm: Normal rate and regular rhythm.     Chest Wall: PMI is not displaced.     Pulses: Normal pulses.     Heart sounds: Normal heart sounds. No murmur heard.    No friction rub. No gallop.     Comments: ICD pocket well healed Pulmonary:     Effort: Pulmonary effort is normal. No respiratory distress.     Breath sounds: Normal breath sounds. No wheezing.  Abdominal:     General: Bowel sounds are normal. There is no distension or abdominal bruit.     Palpations: Abdomen is soft. There is no hepatomegaly or splenomegaly.     Tenderness: There is no abdominal tenderness. There is no right CVA tenderness or left CVA tenderness.     Hernia: No hernia is present.  Musculoskeletal:        General: Normal range of motion.     Cervical back: Normal range of motion and neck supple.     Right lower leg: No edema.     Left lower leg: No edema.  Lymphadenopathy:     Cervical: No cervical adenopathy.  Skin:    General: Skin is warm and dry.     Capillary Refill: Capillary refill takes less than 2 seconds.     Coloration: Skin is not cyanotic, jaundiced or pale.     Findings: No rash.  Neurological:     General: No focal deficit present.     Mental Status: He is alert and oriented to person, place, and time.     Sensory: Sensation is intact.     Motor: Motor function is intact.     Coordination: Coordination is intact.     Gait: Gait is intact.     Deep Tendon Reflexes: Reflexes are normal and symmetric.  Psychiatric:  Attention and  Perception: Attention and perception normal.        Mood and Affect: Mood and affect normal.        Speech: Speech normal.        Behavior: Behavior normal. Behavior is cooperative.        Thought Content: Thought content normal.        Cognition and Memory: Cognition and memory normal.        Judgment: Judgment normal.     Results for orders placed or performed in visit on 01/27/22  Bayer DCA Hb A1c Waived  Result Value Ref Range   HB A1C (BAYER DCA - WAIVED) 7.3 (H) 4.8 - 5.6 %  Urinalysis  Result Value Ref Range   Specific Gravity, UA 1.015 1.005 - 1.030   pH, UA 5.5 5.0 - 7.5   Color, UA Yellow Yellow   Appearance Ur Cloudy (A) Clear   Leukocytes,UA 1+ (A) Negative   Protein,UA 2+ (A) Negative/Trace   Glucose, UA 3+ (A) Negative   Ketones, UA Negative Negative   RBC, UA 1+ (A) Negative   Bilirubin, UA Negative Negative   Urobilinogen, Ur 0.2 0.2 - 1.0 mg/dL   Nitrite, UA Negative Negative       Pertinent labs & imaging results that were available during my care of the patient were reviewed by me and considered in my medical decision making.  Assessment & Plan:  [place] was seen today for medical management of chronic issues, diabetes, hyperlipidemia, hypertension and chronic kidney disease.  Diagnoses and all orders for this visit:  Type 2 diabetes mellitus with stage 3 chronic kidney disease, without long-term current use of insulin, unspecified whether stage 3a or 3b CKD (HCC) A1C 7.3 today. Continue current regimen along with diet and exercise. Other labs pending.  -     CBC with Differential/Platelet -     CMP14+EGFR -     Lipid panel -     Bayer DCA Hb A1c Waived  Hyperlipidemia associated with type 2 diabetes mellitus ([place]) Diet encouraged - increase intake of fresh fruits and vegetables, increase intake of lean proteins. Bake, broil, or grill foods. Avoid fried, greasy, and fatty foods. Avoid fast foods. Increase intake of fiber-rich whole grains. Exercise  encouraged - at least 150 minutes per week and advance as tolerated.  Goal BMI < 25. Continue medications as prescribed. Follow up in 3-6 months as discussed.  -     CBC with Differential/Platelet -     CMP14+EGFR -     Lipid panel -     Bayer DCA Hb A1c Waived  Hypertension associated with type 2 diabetes mellitus (HCC) BP well controlled. Changes were not made in regimen today. Goal BP is 130/80. Pt aware to report any persistent high or low readings. DASH diet and exercise encouraged. Exercise at least 150 minutes per week and increase as tolerated. Goal BMI > 25. Stress management encouraged. Avoid nicotine and tobacco product use. Avoid excessive alcohol and NSAID's. Avoid more than 2000 mg of sodium daily. Medications as prescribed. Follow up as scheduled.  -     CBC with Differential/Platelet -     CMP14+EGFR -     Lipid panel -     Bayer DCA Hb A1c Waived  CKD stage 3 due to type 2 diabetes mellitus ([place]) Followed by Dr. Theador Hawthorne on a regular basis. Labs pending.  -     CBC with Differential/Platelet -     CMP14+EGFR -  Lipid panel -     Bayer DCA Hb A1c Waived  UTI symptoms Urinalysis indicates cystitis, culture added.  -     Urine Culture -     Urinalysis  Acute cystitis with hematuria Will start below, urine culture pending, will change treatment regimen if warranted.  -     sulfamethoxazole-trimethoprim (BACTRIM DS) 800-160 MG tablet; Take 1 tablet by mouth 2 (two) times daily for 5 days.     Continue all other maintenance medications.  Follow up plan: Return in about 3 months (around 04/28/2022) for DM.   Continue healthy lifestyle choices, including diet (rich in fruits, vegetables, and lean proteins, and low in salt and simple carbohydrates) and exercise (at least 30 minutes of moderate physical activity daily).  Educational handout given for DM  The above assessment and management plan was discussed with the patient. The patient verbalized understanding of  and has agreed to the management plan. Patient is aware to call the clinic if they develop any new symptoms or if symptoms persist or worsen. Patient is aware when to return to the clinic for a follow-up visit. Patient educated on when it is appropriate to go to the emergency department.   Monia Pouch, FNP-C [facility] [phone]

## 2022-01-27 NOTE — Patient Instructions (Signed)

## 2022-01-28 LAB — CMP14+EGFR
ALT: 15 IU/L (ref 0–44)
AST: 17 IU/L (ref 0–40)
Albumin/Globulin Ratio: 1.7 (ref 1.2–2.2)
Albumin: 4.5 g/dL (ref 3.7–4.7)
Alkaline Phosphatase: 88 IU/L (ref 44–121)
BUN/Creatinine Ratio: 19 (ref 10–24)
BUN: 28 mg/dL — ABNORMAL HIGH (ref 8–27)
Bilirubin Total: 0.7 mg/dL (ref 0.0–1.2)
CO2: 21 mmol/L (ref 20–29)
Calcium: 9.7 mg/dL (ref 8.6–10.2)
Chloride: 103 mmol/L (ref 96–106)
Creatinine, Ser: 1.51 mg/dL — ABNORMAL HIGH (ref 0.76–1.27)
Globulin, Total: 2.6 g/dL (ref 1.5–4.5)
Glucose: 115 mg/dL — ABNORMAL HIGH (ref 70–99)
Potassium: 4.5 mmol/L (ref 3.5–5.2)
Sodium: 141 mmol/L (ref 134–144)
Total Protein: 7.1 g/dL (ref 6.0–8.5)
eGFR: 45 mL/min/{1.73_m2} — ABNORMAL LOW (ref >59)

## 2022-01-28 LAB — CBC WITH DIFFERENTIAL/PLATELET
Basophils Absolute: 0.1 10*3/uL (ref 0.0–0.2)
Basos: 1 %
EOS (ABSOLUTE): 0.6 10*3/uL — ABNORMAL HIGH (ref 0.0–0.4)
Eos: 5 %
Hematocrit: 45.6 % (ref 37.5–51.0)
Hemoglobin: 15.5 g/dL (ref 13.0–17.7)
Immature Grans (Abs): 0 10*3/uL (ref 0.0–0.1)
Immature Granulocytes: 0 %
Lymphocytes Absolute: 2.4 10*3/uL (ref 0.7–3.1)
Lymphs: 21 %
MCH: 31.1 pg (ref 26.6–33.0)
MCHC: 34 g/dL (ref 31.5–35.7)
MCV: 91 fL (ref 79–97)
Monocytes Absolute: 1.2 10*3/uL — ABNORMAL HIGH (ref 0.1–0.9)
Monocytes: 11 %
Neutrophils Absolute: 7.3 10*3/uL — ABNORMAL HIGH (ref 1.4–7.0)
Neutrophils: 62 %
Platelets: 220 10*3/uL (ref 150–450)
RBC: 4.99 x10E6/uL (ref 4.14–5.80)
RDW: 12.4 % (ref 11.6–15.4)
WBC: 11.6 10*3/uL — ABNORMAL HIGH (ref 3.4–10.8)

## 2022-01-28 LAB — LIPID PANEL
Chol/HDL Ratio: 2.5 ratio (ref 0.0–5.0)
Cholesterol, Total: 151 mg/dL (ref 100–199)
HDL: 61 mg/dL (ref >39)
LDL Chol Calc (NIH): 72 mg/dL (ref 0–99)
Triglycerides: 100 mg/dL (ref 0–149)
VLDL Cholesterol Cal: 18 mg/dL (ref 5–40)

## 2022-01-28 NOTE — Progress Notes (Signed)
Patient returning call. Please call back

## 2022-01-30 LAB — URINE CULTURE

## 2022-01-30 NOTE — Progress Notes (Signed)
Remote ICD transmission.   

## 2022-02-11 ENCOUNTER — Telehealth: Payer: Self-pay | Admitting: Family Medicine

## 2022-02-11 MED ORDER — SULFAMETHOXAZOLE-TRIMETHOPRIM 800-160 MG PO TABS: 1.0000 | ORAL_TABLET | Freq: Two times a day (BID) | ORAL | 0 refills | Status: AC

## 2022-02-11 NOTE — Telephone Encounter (Signed)
[email or web address]

## 2022-02-11 NOTE — Addendum Note (Signed)
Addended by: Baruch Gouty on: 02/11/2022 02:17 PM   Modules accepted: Orders

## 2022-02-11 NOTE — Telephone Encounter (Signed)
Patient aware and verbalizes understanding. 

## 2022-02-11 NOTE — Telephone Encounter (Signed)
Patient was given Bactrim - please advise if OV needed

## 2022-02-11 NOTE — Telephone Encounter (Signed)
Patient was seen on 12/26 and given medication for UTI, stated that it had gotten some better but not completely gone. Wants to know if something else can be called in to  [place], [place] that he may need another appt.

## 2022-02-16 ENCOUNTER — Ambulatory Visit (INDEPENDENT_AMBULATORY_CARE_PROVIDER_SITE_OTHER): Payer: Medicare Other

## 2022-02-16 ENCOUNTER — Telehealth: Payer: Self-pay

## 2022-02-16 DIAGNOSIS — Z95810 Presence of automatic (implantable) cardiac defibrillator: Secondary | ICD-10-CM | POA: Diagnosis not present

## 2022-02-16 DIAGNOSIS — I5022 Chronic systolic (congestive) heart failure: Secondary | ICD-10-CM | POA: Diagnosis not present

## 2022-02-16 NOTE — Telephone Encounter (Signed)
REC'D FAX FROM [facility].   PATIENT ENROLLED UNTIL 02/02/23.  FARXIGA NEEDS REFILLS - PLEASE SEND TO [place].

## 2022-02-18 NOTE — Progress Notes (Signed)
EPIC Encounter for ICM Monitoring  Patient Name: Omar Williams is a 87 y.o. male Date: 02/18/2022 Primary Care Physican: Baruch Gouty, [place] Primary Cardiologist:  Harl Bowie Electrophysiologist: Lovena Le Nephrologist: [facility] Bi-V Pacing: 98.9% 08/12/2021 Weight: 161 lbs 09/17/2021 Weight: 162 lbs 10/22/2021 Weight: 161-162 lbs   Spoke with patient and heart failure questions reviewed.  Pt asymptomatic for fluid accumulation.     Optivol Thoracic impedance suggesting normal fluid levels.   Prescribed:  Furosemide 20 mg take 1 tablet as needed for swelling.     Labs: 10/09/2021 Creatinine 1.39, BUN 28, Potassium 4.4, Sodium 141, GFR 50 07/17/2021 Creatinine 1.44, BUN 36, Potassium 4.6, Sodium 141, GFR 48 04/15/2021 Creatinine 1.41, BUN 22, Potassium 4.5, Sodium 144, GFR 49 A complete set of results can be found in Results Review.   Recommendations:  Encouraged to call if experiencing any fluid symptoms.    Follow-up plan: ICM clinic phone appointment on 03/24/2022.   91 day device clinic remote transmission 04/08/2022.       EP/Cardiology Office Visits: 05/26/2022 with Dr Harl Bowie.  Recall 08/06/2022 with Dr Lovena Le.        Copy of ICM check sent to Dr. Lovena Le.  3 month ICM trend: 02/16/2022.    12-14 Month ICM trend:     Rosalene Billings, RN 02/18/2022 3:33 PM

## 2022-02-23 DIAGNOSIS — E1122 Type 2 diabetes mellitus with diabetic chronic kidney disease: Secondary | ICD-10-CM | POA: Diagnosis not present

## 2022-02-23 DIAGNOSIS — E1129 Type 2 diabetes mellitus with other diabetic kidney complication: Secondary | ICD-10-CM | POA: Diagnosis not present

## 2022-02-23 DIAGNOSIS — R809 Proteinuria, unspecified: Secondary | ICD-10-CM | POA: Diagnosis not present

## 2022-02-23 DIAGNOSIS — N189 Chronic kidney disease, unspecified: Secondary | ICD-10-CM | POA: Diagnosis not present

## 2022-02-25 DIAGNOSIS — R809 Proteinuria, unspecified: Secondary | ICD-10-CM | POA: Diagnosis not present

## 2022-02-25 DIAGNOSIS — I5032 Chronic diastolic (congestive) heart failure: Secondary | ICD-10-CM | POA: Diagnosis not present

## 2022-02-25 DIAGNOSIS — I129 Hypertensive chronic kidney disease with stage 1 through stage 4 chronic kidney disease, or unspecified chronic kidney disease: Secondary | ICD-10-CM | POA: Diagnosis not present

## 2022-02-25 DIAGNOSIS — N189 Chronic kidney disease, unspecified: Secondary | ICD-10-CM | POA: Diagnosis not present

## 2022-03-04 DIAGNOSIS — R809 Proteinuria, unspecified: Secondary | ICD-10-CM | POA: Diagnosis not present

## 2022-03-04 DIAGNOSIS — E1129 Type 2 diabetes mellitus with other diabetic kidney complication: Secondary | ICD-10-CM | POA: Diagnosis not present

## 2022-03-04 DIAGNOSIS — E1122 Type 2 diabetes mellitus with diabetic chronic kidney disease: Secondary | ICD-10-CM | POA: Diagnosis not present

## 2022-03-04 DIAGNOSIS — N189 Chronic kidney disease, unspecified: Secondary | ICD-10-CM | POA: Diagnosis not present

## 2022-03-24 ENCOUNTER — Ambulatory Visit: Payer: Medicare Other

## 2022-03-24 DIAGNOSIS — I5022 Chronic systolic (congestive) heart failure: Secondary | ICD-10-CM

## 2022-03-24 DIAGNOSIS — Z95810 Presence of automatic (implantable) cardiac defibrillator: Secondary | ICD-10-CM

## 2022-03-25 NOTE — Progress Notes (Signed)
EPIC Encounter for ICM Monitoring  Patient Name: Omar Williams is a 87 y.o. male Date: 03/25/2022 Primary Care Physican: Baruch Gouty, [place] Primary Cardiologist:  Harl Bowie Electrophysiologist: Lovena Le Nephrologist: [facility] Bi-V Pacing: 98.6% 08/12/2021 Weight: 161 lbs 09/17/2021 Weight: 162 lbs 10/22/2021 Weight: 161-162 lbs   Spoke with patient and heart failure questions reviewed.  Pt asymptomatic for fluid accumulation.     Optivol Thoracic impedance suggesting normal fluid levels.   Prescribed:  Furosemide 20 mg take 1 tablet as needed for swelling.     Labs: 10/09/2021 Creatinine 1.39, BUN 28, Potassium 4.4, Sodium 141, GFR 50 07/17/2021 Creatinine 1.44, BUN 36, Potassium 4.6, Sodium 141, GFR 48 04/15/2021 Creatinine 1.41, BUN 22, Potassium 4.5, Sodium 144, GFR 49 A complete set of results can be found in Results Review.   Recommendations:  Encouraged to call if experiencing any fluid symptoms.    Follow-up plan: ICM clinic phone appointment on 04/27/2022.   91 day device clinic remote transmission 04/08/2022.       EP/Cardiology Office Visits: 05/26/2022 with Dr Harl Bowie.  Recall 08/06/2022 with Dr Lovena Le.        Copy of ICM check sent to Dr. Lovena Le.  3 month ICM trend: 03/24/2022.    12-14 Month ICM trend:     Rosalene Billings, RN 03/25/2022 5:04 PM

## 2022-04-08 ENCOUNTER — Ambulatory Visit: Payer: Medicare Other

## 2022-04-08 DIAGNOSIS — I255 Ischemic cardiomyopathy: Secondary | ICD-10-CM | POA: Diagnosis not present

## 2022-04-08 LAB — CUP PACEART REMOTE DEVICE CHECK
Battery Remaining Longevity: 25 mo
Battery Voltage: 2.94 V
Brady Statistic AP VP Percent: 48.51 %
Brady Statistic AP VS Percent: 0.03 %
Brady Statistic AS VP Percent: 50.73 %
Brady Statistic AS VS Percent: 0.73 %
Brady Statistic RA Percent Paced: 48.51 %
Brady Statistic RV Percent Paced: 93.9 %
Date Time Interrogation Session: 20240306033523
HighPow Impedance: 81 Ohm
Implantable Lead Connection Status: 753985
Implantable Lead Connection Status: 753985
Implantable Lead Connection Status: 753985
Implantable Lead Implant Date: 20181130
Implantable Lead Implant Date: 20181130
Implantable Lead Implant Date: 20181130
Implantable Lead Location: 753858
Implantable Lead Location: 753859
Implantable Lead Location: 753860
Implantable Lead Model: 4598
Implantable Lead Model: 5076
Implantable Lead Model: 6935
Implantable Pulse Generator Implant Date: 20181130
Lead Channel Impedance Value: 145.871
Lead Channel Impedance Value: 145.871
Lead Channel Impedance Value: 156.606
Lead Channel Impedance Value: 156.606
Lead Channel Impedance Value: 161.5 Ohm
Lead Channel Impedance Value: 266 Ohm
Lead Channel Impedance Value: 304 Ohm
Lead Channel Impedance Value: 323 Ohm
Lead Channel Impedance Value: 323 Ohm
Lead Channel Impedance Value: 342 Ohm
Lead Channel Impedance Value: 380 Ohm
Lead Channel Impedance Value: 494 Ohm
Lead Channel Impedance Value: 513 Ohm
Lead Channel Impedance Value: 513 Ohm
Lead Channel Impedance Value: 532 Ohm
Lead Channel Impedance Value: 570 Ohm
Lead Channel Impedance Value: 570 Ohm
Lead Channel Impedance Value: 627 Ohm
Lead Channel Pacing Threshold Amplitude: 0.375 V
Lead Channel Pacing Threshold Amplitude: 0.625 V
Lead Channel Pacing Threshold Amplitude: 1.125 V
Lead Channel Pacing Threshold Pulse Width: 0.4 ms
Lead Channel Pacing Threshold Pulse Width: 0.4 ms
Lead Channel Pacing Threshold Pulse Width: 0.4 ms
Lead Channel Sensing Intrinsic Amplitude: 2.875 mV
Lead Channel Sensing Intrinsic Amplitude: 2.875 mV
Lead Channel Sensing Intrinsic Amplitude: 8.375 mV
Lead Channel Sensing Intrinsic Amplitude: 8.375 mV
Lead Channel Setting Pacing Amplitude: 1.75 V
Lead Channel Setting Pacing Amplitude: 2 V
Lead Channel Setting Pacing Amplitude: 2.5 V
Lead Channel Setting Pacing Pulse Width: 0.4 ms
Lead Channel Setting Pacing Pulse Width: 0.4 ms
Lead Channel Setting Sensing Sensitivity: 0.3 mV
Zone Setting Status: 755011
Zone Setting Status: 755011

## 2022-04-27 ENCOUNTER — Ambulatory Visit: Payer: Medicare Other | Attending: Internal Medicine

## 2022-04-27 DIAGNOSIS — Z95810 Presence of automatic (implantable) cardiac defibrillator: Secondary | ICD-10-CM | POA: Diagnosis not present

## 2022-04-27 DIAGNOSIS — I5022 Chronic systolic (congestive) heart failure: Secondary | ICD-10-CM | POA: Diagnosis not present

## 2022-04-27 NOTE — Progress Notes (Signed)
EPIC Encounter for ICM Monitoring  Patient Name: Omar Williams is a 87 y.o. male Date: 04/27/2022 Primary Care Physican: Baruch Gouty, [place] Primary Cardiologist:  Harl Bowie Electrophysiologist: Lovena Le Nephrologist: [facility] Bi-V Pacing: 98.9% 10/22/2021 Weight: 161-162 lbs 04/27/2022 Weight: 160 lbs   Spoke with patient and heart failure questions reviewed.  Transmission results reviewed.  Pt asymptomatic for fluid accumulation.  Reports feeling well at this time and voices no complaints.  He has been eating out a lot since his wife has been sick and back in the hospital.  She is home now and they are leaving for [place] on Saturday.   Optivol Thoracic impedance suggesting possible fluid accumulation starting 3/4.   Prescribed:  Furosemide 20 mg take 1 tablet as needed for swelling.     Labs: 10/09/2021 Creatinine 1.39, BUN 28, Potassium 4.4, Sodium 141, GFR 50 07/17/2021 Creatinine 1.44, BUN 36, Potassium 4.6, Sodium 141, GFR 48 04/15/2021 Creatinine 1.41, BUN 22, Potassium 4.5, Sodium 144, GFR 49 A complete set of results can be found in Results Review.   Recommendations:  He will take Furosemide tablet and send manual transmission on 3/27 for recheck.    Follow-up plan: ICM clinic phone appointment on 04/29/2022 (manual) to recheck fluid levels.   91 day device clinic remote transmission 07/08/2022.       EP/Cardiology Office Visits: 05/26/2022 with Dr Harl Bowie.  Recall 08/06/2022 with Dr Lovena Le.        Copy of ICM check sent to Dr. Lovena Le.    3 month ICM trend: 04/27/2022.    12-14 Month ICM trend:     Rosalene Billings, RN 04/27/2022 10:43 AM

## 2022-04-29 ENCOUNTER — Ambulatory Visit (INDEPENDENT_AMBULATORY_CARE_PROVIDER_SITE_OTHER): Payer: Medicare Other

## 2022-04-29 ENCOUNTER — Encounter: Payer: Self-pay | Admitting: Family Medicine

## 2022-04-29 ENCOUNTER — Ambulatory Visit (INDEPENDENT_AMBULATORY_CARE_PROVIDER_SITE_OTHER): Payer: Medicare Other | Admitting: Family Medicine

## 2022-04-29 VITALS — BP 134/73 | HR 60 | Temp 97.4°F | Ht 67.0 in | Wt 158.8 lb

## 2022-04-29 DIAGNOSIS — I152 Hypertension secondary to endocrine disorders: Secondary | ICD-10-CM

## 2022-04-29 DIAGNOSIS — E1159 Type 2 diabetes mellitus with other circulatory complications: Secondary | ICD-10-CM | POA: Diagnosis not present

## 2022-04-29 DIAGNOSIS — E785 Hyperlipidemia, unspecified: Secondary | ICD-10-CM

## 2022-04-29 DIAGNOSIS — E1122 Type 2 diabetes mellitus with diabetic chronic kidney disease: Secondary | ICD-10-CM

## 2022-04-29 DIAGNOSIS — Z95810 Presence of automatic (implantable) cardiac defibrillator: Secondary | ICD-10-CM

## 2022-04-29 DIAGNOSIS — K219 Gastro-esophageal reflux disease without esophagitis: Secondary | ICD-10-CM

## 2022-04-29 DIAGNOSIS — E1169 Type 2 diabetes mellitus with other specified complication: Secondary | ICD-10-CM | POA: Diagnosis not present

## 2022-04-29 DIAGNOSIS — E1121 Type 2 diabetes mellitus with diabetic nephropathy: Secondary | ICD-10-CM | POA: Diagnosis not present

## 2022-04-29 DIAGNOSIS — I5022 Chronic systolic (congestive) heart failure: Secondary | ICD-10-CM

## 2022-04-29 DIAGNOSIS — N183 Chronic kidney disease, stage 3 unspecified: Secondary | ICD-10-CM

## 2022-04-29 LAB — BAYER DCA HB A1C WAIVED: HB A1C (BAYER DCA - WAIVED): 6.6 % — ABNORMAL HIGH (ref 4.8–5.6)

## 2022-04-29 MED ORDER — GLIMEPIRIDE 4 MG PO TABS: 4.0000 mg | ORAL_TABLET | Freq: Two times a day (BID) | ORAL | 0 refills | Status: DC

## 2022-04-29 MED ORDER — PANTOPRAZOLE SODIUM 40 MG PO TBEC: 40.0000 mg | DELAYED_RELEASE_TABLET | Freq: Every day | ORAL | 1 refills | Status: DC

## 2022-04-29 MED ORDER — LISINOPRIL 2.5 MG PO TABS: 2.5000 mg | ORAL_TABLET | Freq: Every day | ORAL | 1 refills | Status: DC

## 2022-04-29 NOTE — Patient Instructions (Addendum)

## 2022-04-29 NOTE — Progress Notes (Signed)
EPIC Encounter for ICM Monitoring  Patient Name: Omar Williams is a 87 y.o. male Date: 04/29/2022 Primary Care Physican: Baruch Gouty, [place] Primary Cardiologist:  Harl Bowie Electrophysiologist: Lovena Le Nephrologist: [facility] Bi-V Pacing: 97.5% 10/22/2021 Weight: 161-162 lbs 04/27/2022 Weight: 160 lbs   Spoke with patient and heart failure questions reviewed.  Transmission results reviewed.  Pt asymptomatic for fluid accumulation.    Optivol Thoracic impedance suggesting fluid levels returned to normal.   Prescribed:  Furosemide 20 mg take 1 tablet as needed for swelling.     Labs: 10/09/2021 Creatinine 1.39, BUN 28, Potassium 4.4, Sodium 141, GFR 50 07/17/2021 Creatinine 1.44, BUN 36, Potassium 4.6, Sodium 141, GFR 48 04/15/2021 Creatinine 1.41, BUN 22, Potassium 4.5, Sodium 144, GFR 49 A complete set of results can be found in Results Review.   Recommendations:  He will take Furosemide tablet and send manual transmission on 3/27 for recheck.    Follow-up plan: ICM clinic phone appointment on 06/02/2022.   91 day device clinic remote transmission 07/08/2022.       EP/Cardiology Office Visits: 05/26/2022 with Dr Harl Bowie.  08/18/2022 with Dr Lovena Le.        Copy of ICM check sent to Dr. Lovena Le.    3 month ICM trend: 04/29/2022.    12-14 Month ICM trend:     Rosalene Billings, RN 04/29/2022 9:08 AM

## 2022-04-29 NOTE — Progress Notes (Signed)
Subjective:  Patient ID: Omar Williams, male    DOB: 29-Mar-1935, 87 y.o.   MRN: [id number]  Patient Care Team: Baruch Gouty, FNP as PCP - General (Family Medicine) Thompson Grayer, MD (Inactive) as PCP - Electrophysiology (Cardiology) Harl Bowie Alphonse Guild, MD as PCP - Cardiology (Cardiology) Shirl Harris, [place] (Optometry) Verta Ellen., NP (Inactive) as Nurse Practitioner (Cardiology) Liana Gerold, MD as Consulting Physician (Nephrology) Irine Seal, MD as Attending Physician (Urology) Aviva Signs, MD as Consulting Physician (General Surgery) Lavera Guise, [facility] as Pharmacist (Family Medicine)   Chief Complaint:  Diabetes (3 month follow up )   HPI: Omar Williams is a 87 y.o. male presenting on 04/29/2022 for Diabetes (3 month follow up )    1. Type 2 diabetes mellitus with stage 3 chronic kidney disease, without long-term current use of insulin, unspecified whether stage 3a or 3b CKD ([place]) Has been compliant with medications. Reports one low reading of 58, asymptomatic with this. No polyuria, polydipsia, or polyphagia.   2. Hyperlipidemia associated with type 2 diabetes mellitus ([place]) Compliant with medications - Yes Current medications - Crestor, Zetia, Fish Oil Side effects from medications - No Diet - generally healthy Exercise - active daily   3. Hypertension associated with type 2 diabetes mellitus ([place]) Complaint with meds - Yes Current Medications - lasix, lisinopril Checking BP at home - no Exercising Regularly - active daily Watching Salt intake - Yes Pertinent ROS:  Headache - No Fatigue - Yes Visual Disturbances - Yes Chest pain - Yes Dyspnea - Yes Palpitations - Yes LE edema - Yes They report good compliance with medications and can restate their regimen by memory. No medication side effects.  BP Readings from Last 3 Encounters:  04/29/22 134/73  01/27/22 127/71  11/19/21 118/72    4. Diabetic nephropathy associated with type 2 diabetes  mellitus ([place]) No new or worsening symptoms. No on medications at this time.  5. Gastro-esophageal reflux disease without esophagitis Compliant with medications - Yes Current medications - Protonix Adverse side effects - No Cough - No Sore throat - No Voice change - No Hemoptysis - No Dysphagia or dyspepsia - No Water brash - No Red Flags (weight loss, hematochezia, melena, weight loss, early satiety, fevers, odynophagia, or persistent vomiting) - No    Relevant past medical, surgical, family, and social history reviewed and updated as indicated.  Allergies and medications reviewed and updated. Data reviewed: Chart in Epic.   Past Medical History:  Diagnosis Date   Acid reflux    Blood transfusion without reported diagnosis    Cardiac arrest ([place])    a. occuring in 12/2016. LAD disease but no targets for CABG or PCI. Underwent ICD placement as EF 15-20%.    CHF (congestive heart failure) (HCC)    Coronary artery disease    Diabetes mellitus without complication ([place])    Diabetic nephropathy ([place])    Hyperlipidemia    Hypertension    Myocardial infarction [facility])    Renal disorder     Past Surgical History:  Procedure Laterality Date   BIV ICD INSERTION CRT-D N/A 01/01/2017   Procedure: BIV ICD INSERTION CRT-D;  Surgeon: Constance Haw, MD;  Location: [facility];  Service: Cardiovascular;  Laterality: N/A;    Social History   Socioeconomic History   Marital status: Married    Spouse name: johnna   Number of children: 3   Years of education: 12   Highest education  level: Some college, no degree  Occupational History   Occupation: Radiographer, therapeutic   retired   Occupation: Theme park manager  Tobacco Use   Smoking status: Former    Types: Cigarettes    Quit date: 1960    Years since quitting: 64.2    Passive exposure: Never   Smokeless tobacco: Never   Tobacco comments:    2-3 years as a teenager  Scientific laboratory technician Use: Never used  Substance and Sexual  Activity   Alcohol use: No   Drug use: No   Sexual activity: Not Currently  Other Topics Concern   Not on file  Social History Narrative   Lives with with wife [name]   Married 91 years   Daughter [name] stays frequently   Granddaughter Jacqlyn Larsen near by   Social Determinants of Health   Financial Resource Strain: Low Risk  (06/16/2021)   Overall Financial Resource Strain (CARDIA)    Difficulty of Paying Living Expenses: Not hard at all  Food Insecurity: No Food Insecurity (06/16/2021)   Hunger Vital Sign    Worried About Running Out of Food in the Last Year: Never true    [place] in the Last Year: Never true  Transportation Needs: No Transportation Needs (06/16/2021)   PRAPARE - Hydrologist (Medical): No    Lack of Transportation (Non-Medical): No  Physical Activity: Sufficiently Active (06/16/2021)   Exercise Vital Sign    Days of Exercise per Week: 5 days    Minutes of Exercise per Session: 30 min  Stress: No Stress Concern Present (06/16/2021)   [place]    Feeling of Stress : Not at all  Social Connections: [place] (06/16/2021)   Social Connection and Isolation Panel [NHANES]    Frequency of Communication with Friends and Family: More than three times a week    Frequency of Social Gatherings with Friends and Family: More than three times a week    Attends Religious Services: More than 4 times per year    Active Member of [employer] or Organizations: Yes    Attends Music therapist: More than 4 times per year    Marital Status: Married  Human resources officer Violence: Not At Risk (06/16/2021)   Humiliation, Afraid, Rape, and Kick questionnaire    Fear of Current or Ex-Partner: No    Emotionally Abused: No    Physically Abused: No    Sexually Abused: No    Outpatient Encounter Medications as of 04/29/2022  Medication Sig   aspirin 81 MG chewable tablet  Chew 81 mg by mouth daily.   Bee Pollen 550 MG CAPS Take 2 capsules by mouth 2 (two) times daily.   chlorhexidine (PERIDEX) 0.12 % solution as needed.   Cholecalciferol (VITAMIN D3) 125 MCG (5000 UT) TABS Take 1 tablet by mouth daily.    CINNAMON PO Take 1,000 mg by mouth in the morning and at bedtime.   dapagliflozin propanediol (FARXIGA) 10 MG TABS tablet Take 1 tablet (10 mg total) by mouth daily before breakfast.   docusate sodium (COLACE) 50 MG capsule Take 100 mg by mouth 2 (two) times daily.   ezetimibe (ZETIA) 10 MG tablet TAKE 1 TABLET BY MOUTH EVERY DAY   finasteride (PROSCAR) 5 MG tablet TAKE 1 TABLET BY MOUTH EVERYDAY AT BEDTIME   furosemide (LASIX) 20 MG tablet Take 1 tablet (20 mg total) by mouth daily as needed for edema.  glucose blood (ONETOUCH ULTRA) test strip Test BS daily Dx E11.9   nitroGLYCERIN (NITROSTAT) 0.4 MG SL tablet Place 1 tablet (0.4 mg total) under the tongue every 5 (five) minutes x 3 doses as needed for chest pain.   Omega-3 Fatty Acids (FISH OIL) 1200 MG CAPS Take 1 capsule by mouth 2 (two) times daily.    oxybutynin (DITROPAN) 5 MG tablet Take 0.5 tablets (2.5 mg total) by mouth 3 (three) times daily.   rosuvastatin (CRESTOR) 40 MG tablet Take 1 tablet (40 mg total) by mouth daily.   tamsulosin (FLOMAX) 0.4 MG CAPS capsule TAKE 1 CAPSULE (0.4 MG TOTAL) BY MOUTH EVERY MORNING.   TURMERIC PO Take 1,000 mg by mouth 2 (two) times daily.   vitamin C (ASCORBIC ACID) 500 MG tablet Take 1,000 mg by mouth 2 (two) times daily.   [DISCONTINUED] glimepiride (AMARYL) 4 MG tablet TAKE 1 TABLET (4 MG TOTAL) BY MOUTH IN THE MORNING AND AT BEDTIME. (Patient taking differently: Take 2 mg by mouth at bedtime.)   [DISCONTINUED] lisinopril (ZESTRIL) 2.5 MG tablet Take 1 tablet (2.5 mg total) by mouth daily.   [DISCONTINUED] pantoprazole (PROTONIX) 40 MG tablet Take 1 tablet (40 mg total) by mouth daily.   glimepiride (AMARYL) 4 MG tablet Take 1 tablet (4 mg total) by mouth in  the morning and at bedtime.   lisinopril (ZESTRIL) 2.5 MG tablet Take 1 tablet (2.5 mg total) by mouth daily.   pantoprazole (PROTONIX) 40 MG tablet Take 1 tablet (40 mg total) by mouth daily.   No facility-administered encounter medications on file as of 04/29/2022.    No Known Allergies  Review of Systems  Constitutional:  Negative for activity change, appetite change, chills, diaphoresis, fatigue, fever and unexpected weight change.  HENT: Negative.    Eyes: Negative.  Negative for photophobia and visual disturbance.  Respiratory:  Negative for cough, chest tightness and shortness of breath.   Cardiovascular:  Negative for chest pain, palpitations and leg swelling.  Gastrointestinal:  Negative for abdominal pain, blood in stool, constipation, diarrhea, nausea and vomiting.  Endocrine: Negative.  Negative for polydipsia, polyphagia and polyuria.  Genitourinary:  Negative for decreased urine volume, difficulty urinating, dysuria, frequency and urgency.  Musculoskeletal:  Negative for arthralgias and myalgias.  Skin: Negative.   Allergic/Immunologic: Negative.   Neurological:  Negative for dizziness, tremors, seizures, syncope, facial asymmetry, speech difficulty, weakness, light-headedness, numbness and headaches.  Hematological: Negative.   Psychiatric/Behavioral:  Negative for confusion, hallucinations, sleep disturbance and suicidal ideas.   All other systems reviewed and are negative.       Objective:  BP 134/73   Pulse 60   Temp (!) 97.4 F (36.3 C) (Temporal)   Ht 5\' 7"  (1.702 m)   Wt 158 lb 12.8 oz (72 kg)   SpO2 94%   BMI 24.87 kg/m    Wt Readings from Last 3 Encounters:  04/29/22 158 lb 12.8 oz (72 kg)  01/27/22 163 lb (73.9 kg)  11/19/21 163 lb 6.4 oz (74.1 kg)    Physical Exam Vitals and nursing note reviewed.  Constitutional:      General: He is not in acute distress.    Appearance: Normal appearance. He is well-developed and well-groomed. He is not  ill-appearing, toxic-appearing or diaphoretic.  HENT:     Head: Normocephalic and atraumatic.     Jaw: There is normal jaw occlusion.     Right Ear: Hearing normal.     Left Ear: Hearing normal.  Nose: Nose normal.     Mouth/Throat:     Lips: Pink.     Mouth: Mucous membranes are moist.     Pharynx: Oropharynx is clear. Uvula midline.  Eyes:     General: Lids are normal.     Extraocular Movements: Extraocular movements intact.     Conjunctiva/sclera: Conjunctivae normal.     Pupils: Pupils are equal, round, and reactive to light.  Neck:     Thyroid: No thyroid mass, thyromegaly or thyroid tenderness.     Vascular: No carotid bruit or JVD.     Trachea: Trachea and phonation normal.  Cardiovascular:     Rate and Rhythm: Normal rate and regular rhythm.     Chest Wall: PMI is not displaced.     Pulses: Normal pulses.     Heart sounds: Normal heart sounds. No murmur heard.    No friction rub. No gallop.     Comments: ICD pocket well healed Pulmonary:     Effort: Pulmonary effort is normal. No respiratory distress.     Breath sounds: Normal breath sounds. No wheezing.  Abdominal:     General: Bowel sounds are normal. There is no distension or abdominal bruit.     Palpations: Abdomen is soft. There is no hepatomegaly or splenomegaly.     Tenderness: There is no abdominal tenderness. There is no right CVA tenderness or left CVA tenderness.     Hernia: No hernia is present.  Musculoskeletal:        General: Normal range of motion.     Cervical back: Normal range of motion and neck supple.     Right lower leg: No edema.     Left lower leg: No edema.  Lymphadenopathy:     Cervical: No cervical adenopathy.  Skin:    General: Skin is warm and dry.     Capillary Refill: Capillary refill takes less than 2 seconds.     Coloration: Skin is not cyanotic, jaundiced or pale.     Findings: No rash.  Neurological:     General: No focal deficit present.     Mental Status: He is alert and  oriented to person, place, and time.     Sensory: Sensation is intact.     Motor: Motor function is intact.     Coordination: Coordination is intact.     Gait: Gait is intact.     Deep Tendon Reflexes: Reflexes are normal and symmetric.  Psychiatric:        Attention and Perception: Attention and perception normal.        Mood and Affect: Mood and affect normal.        Speech: Speech normal.        Behavior: Behavior normal. Behavior is cooperative.        Thought Content: Thought content normal.        Cognition and Memory: Cognition and memory normal.        Judgment: Judgment normal.     Results for orders placed or performed in visit on 04/08/22  CUP PACEART REMOTE DEVICE CHECK  Result Value Ref Range   Date Time Interrogation Session [id number]    Pulse Generator Manufacturer MERM    Pulse Gen Model [place] MRI Quad CRT-D    Pulse Gen Serial Number [id number]    Clinic Name [facility]    Implantable Pulse Generator Type Cardiac Resynch Therapy Defibulator    Implantable Pulse Generator Implant Date [id number]    Implantable Lead  Manufacturer MERM    Implantable Lead Model 4598 Ronaldo Miyamoto S MRI SureScan    Implantable Lead Serial Number [id number]    Implantable Lead Implant Date [id number]    Implantable Lead Location Detail 1 UNKNOWN    Implantable Lead Location [id number]    Implantable Lead Connection Status [id number]    Implantable Lead Manufacturer MERM    Implantable Lead Model 5076 CapSureFix Novus MRI SureScan    Implantable Lead Serial Number [id number]    Implantable Lead Implant Date [id number]    Implantable Lead Location Detail 1 UNKNOWN    Implantable Lead Location [id number]    Implantable Lead Connection Status [id number]    Implantable Lead Manufacturer MERM    Implantable Lead Model 6935 Sprint Quattro Secure S MRI SureScan    Implantable Lead Serial Number [id number]    Implantable Lead Implant Date [id number]    Implantable Lead Location Detail 1  UNKNOWN    Implantable Lead Location [id number]    Implantable Lead Connection Status [id number]    Lead Channel Setting Sensing Sensitivity 0.3 mV   Lead Channel Setting Pacing Amplitude 2 V   Lead Channel Setting Pacing Pulse Width 0.4 ms   Lead Channel Setting Pacing Amplitude 2.5 V   Lead Channel Setting Pacing Pulse Width 0.4 ms   Lead Channel Setting Pacing Amplitude 1.75 V   Lead Channel Setting Pacing Capture Mode Adaptive Capture    Zone Setting Status Active    Zone Setting Status Inactive    Zone Setting Status Inactive    Zone Setting Status [id number]    Zone Setting Status [phone]    Lead Channel Impedance Value 380 ohm   Lead Channel Sensing Intrinsic Amplitude 2.875 mV   Lead Channel Sensing Intrinsic Amplitude 2.875 mV   Lead Channel Pacing Threshold Amplitude 0.625 V   Lead Channel Pacing Threshold Pulse Width 0.4 ms   Lead Channel Impedance Value 627 ohm   Lead Channel Impedance Value 570 ohm   Lead Channel Sensing Intrinsic Amplitude 8.375 mV   Lead Channel Sensing Intrinsic Amplitude 8.375 mV   Lead Channel Pacing Threshold Amplitude 0.375 V   Lead Channel Pacing Threshold Pulse Width 0.4 ms   HighPow Impedance 81 ohm   Lead Channel Impedance Value 513 ohm   Lead Channel Impedance Value 494 ohm   Lead Channel Impedance Value 570 ohm   Lead Channel Impedance Value 342 ohm   Lead Channel Impedance Value 532 ohm   Lead Channel Impedance Value 513 ohm   Lead Channel Impedance Value 323 ohm   Lead Channel Impedance Value 304 ohm   Lead Channel Impedance Value 266 ohm   Lead Channel Impedance Value 323 ohm   Lead Channel Impedance Value 156.606    Lead Channel Impedance Value 145.871    Lead Channel Impedance Value 161.5 ohm   Lead Channel Impedance Value 156.606    Lead Channel Impedance Value 145.871    Lead Channel Pacing Threshold Amplitude 1.125 V   Lead Channel Pacing Threshold Pulse Width 0.4 ms   Battery Status OK    Battery Remaining Longevity 25 mo   Battery  Voltage 2.94 V   Brady Statistic RA Percent Paced 48.51 %   Brady Statistic RV Percent Paced 93.9 %   Brady Statistic AP VP Percent 48.51 %   Brady Statistic AS VP Percent 50.73 %   Brady Statistic AP VS Percent 0.03 %   Brady Statistic AS VS Percent 0.73 %       Pertinent  labs & imaging results that were available during my care of the patient were reviewed by me and considered in my medical decision making.  Assessment & Plan:  Rhyon was seen today for diabetes.  Diagnoses and all orders for this visit:  Type 2 diabetes mellitus with other specified complication, without long-term current use of insulin (HCC) A1C 6.6, well controlled, no changes to regimen. Diet and exercise encouraged. Other labs pending.  -     CBC with Differential/Platelet -     CMP14+EGFR -     Bayer DCA Hb A1c Waived -     lisinopril (ZESTRIL) 2.5 MG tablet; Take 1 tablet (2.5 mg total) by mouth daily. -     glimepiride (AMARYL) 4 MG tablet; Take 1 tablet (4 mg total) by mouth in the morning and at bedtime.  Hyperlipidemia associated with type 2 diabetes mellitus ([place]) Diet encouraged - increase intake of fresh fruits and vegetables, increase intake of lean proteins. Bake, broil, or grill foods. Avoid fried, greasy, and fatty foods. Avoid fast foods. Increase intake of fiber-rich whole grains. Exercise encouraged - at least 150 minutes per week and advance as tolerated.  Goal BMI < 25. Continue medications as prescribed. Follow up in 3-6 months as discussed.  -     CMP14+EGFR  Hypertension associated with type 2 diabetes mellitus (HCC) BP well controlled. Changes were not made in regimen today. Goal BP is 130/80. Pt aware to report any persistent high or low readings. DASH diet and exercise encouraged. Exercise at least 150 minutes per week and increase as tolerated. Goal BMI > 25. Stress management encouraged. Avoid nicotine and tobacco product use. Avoid excessive alcohol and NSAID's. Avoid more than 2000 mg  of sodium daily. Medications as prescribed. Follow up as scheduled.  -     CBC with Differential/Platelet -     CMP14+EGFR -     lisinopril (ZESTRIL) 2.5 MG tablet; Take 1 tablet (2.5 mg total) by mouth daily.  Diabetic nephropathy associated with type 2 diabetes mellitus ([place]) No new or worsening symptoms. Labs pending.  -     CBC with Differential/Platelet -     CMP14+EGFR -     Bayer DCA Hb A1c Waived  Gastro-esophageal reflux disease without esophagitis No red flags present. Diet discussed. Avoid fried, spicy, fatty, greasy, and acidic foods. Avoid caffeine, nicotine, and alcohol. Do not eat 2-3 hours before bedtime and stay upright for at least 1-2 hours after eating. Eat small frequent meals. Avoid NSAID's like motrin and aleve. Medications as prescribed. Report any new or worsening symptoms. Follow up as discussed or sooner if needed.   -     CBC with Differential/Platelet -     pantoprazole (PROTONIX) 40 MG tablet; Take 1 tablet (40 mg total) by mouth daily.  CKD stage 3 due to type 2 diabetes mellitus ([place]) Labs pending, on ACEi -     CBC with Differential/Platelet -     CMP14+EGFR -     lisinopril (ZESTRIL) 2.5 MG tablet; Take 1 tablet (2.5 mg total) by mouth daily.     Continue all other maintenance medications.  Follow up plan: Return in about 3 months (around 07/30/2022) for DM.   Continue healthy lifestyle choices, including diet (rich in fruits, vegetables, and lean proteins, and low in salt and simple carbohydrates) and exercise (at least 30 minutes of moderate physical activity daily).  Educational handout given for DM  The above assessment and management plan was discussed with the patient.  The patient verbalized understanding of and has agreed to the management plan. Patient is aware to call the clinic if they develop any new symptoms or if symptoms persist or worsen. Patient is aware when to return to the clinic for a follow-up visit. Patient educated on when it  is appropriate to go to the emergency department.   Monia Pouch, FNP-C [facility] [phone]

## 2022-04-30 ENCOUNTER — Other Ambulatory Visit: Payer: Self-pay | Admitting: Cardiology

## 2022-04-30 LAB — CMP14+EGFR
ALT: 17 IU/L (ref 0–44)
AST: 19 IU/L (ref 0–40)
Albumin/Globulin Ratio: 1.7 (ref 1.2–2.2)
Albumin: 4.7 g/dL (ref 3.7–4.7)
Alkaline Phosphatase: 95 IU/L (ref 44–121)
BUN/Creatinine Ratio: 18 (ref 10–24)
BUN: 27 mg/dL (ref 8–27)
Bilirubin Total: 0.7 mg/dL (ref 0.0–1.2)
CO2: 25 mmol/L (ref 20–29)
Calcium: 9.9 mg/dL (ref 8.6–10.2)
Chloride: 101 mmol/L (ref 96–106)
Creatinine, Ser: 1.5 mg/dL — ABNORMAL HIGH (ref 0.76–1.27)
Globulin, Total: 2.8 g/dL (ref 1.5–4.5)
Glucose: 121 mg/dL — ABNORMAL HIGH (ref 70–99)
Potassium: 3.8 mmol/L (ref 3.5–5.2)
Sodium: 143 mmol/L (ref 134–144)
Total Protein: 7.5 g/dL (ref 6.0–8.5)
eGFR: 46 mL/min/{1.73_m2} — ABNORMAL LOW (ref >59)

## 2022-04-30 LAB — CBC WITH DIFFERENTIAL/PLATELET
Basophils Absolute: 0.1 10*3/uL (ref 0.0–0.2)
Basos: 1 %
EOS (ABSOLUTE): 0.5 10*3/uL — ABNORMAL HIGH (ref 0.0–0.4)
Eos: 6 %
Hematocrit: 46.9 % (ref 37.5–51.0)
Hemoglobin: 15.9 g/dL (ref 13.0–17.7)
Immature Grans (Abs): 0 10*3/uL (ref 0.0–0.1)
Immature Granulocytes: 0 %
Lymphocytes Absolute: 2.2 10*3/uL (ref 0.7–3.1)
Lymphs: 28 %
MCH: 31.9 pg (ref 26.6–33.0)
MCHC: 33.9 g/dL (ref 31.5–35.7)
MCV: 94 fL (ref 79–97)
Monocytes Absolute: 1 10*3/uL — ABNORMAL HIGH (ref 0.1–0.9)
Monocytes: 12 %
Neutrophils Absolute: 4.3 10*3/uL (ref 1.4–7.0)
Neutrophils: 53 %
Platelets: 237 10*3/uL (ref 150–450)
RBC: 4.98 x10E6/uL (ref 4.14–5.80)
RDW: 13.4 % (ref 11.6–15.4)
WBC: 8 10*3/uL (ref 3.4–10.8)

## 2022-05-07 ENCOUNTER — Telehealth: Payer: Self-pay

## 2022-05-07 NOTE — Telephone Encounter (Signed)
Following alert received from [employer] received for Alert for atrial fib greater than 6 hours, AF started 05/06/2022 at 1603 and is ongoing, AF burden is 9.3% of the time.  NO OAC.  Sent to triage.

## 2022-05-07 NOTE — Telephone Encounter (Addendum)
Following alert received from [employer] received for Alert remote reviewed. Normal device function.   Known PAF, on OAC, AF burden is 2.5% of the time.  There were 11 treated atrial arrhythmias detected.  1 of 11 was successful.  There were 3-86 bursts of atrial ATP used.  Sent to triage for ongoing atrial flutter. Next remote 07/21/2022.  Patient has recent OV w/ Dr. Sallyanne Kuster.   Patient is out of rhythm and MINERVA does not appear to convert patient back into rhythm. Routing to Dr. Sallyanne Kuster for review.

## 2022-05-12 NOTE — Telephone Encounter (Signed)
I spoke with patient, he is in [place] until next Monday, 4/15  I will message Dr.Branch

## 2022-05-12 NOTE — Telephone Encounter (Signed)
Left a message for patient to call office back regarding appointment with provider.

## 2022-05-13 NOTE — Progress Notes (Signed)
Remote ICD transmission.   

## 2022-05-25 DIAGNOSIS — N189 Chronic kidney disease, unspecified: Secondary | ICD-10-CM | POA: Diagnosis not present

## 2022-05-25 DIAGNOSIS — E1122 Type 2 diabetes mellitus with diabetic chronic kidney disease: Secondary | ICD-10-CM | POA: Diagnosis not present

## 2022-05-25 DIAGNOSIS — E1129 Type 2 diabetes mellitus with other diabetic kidney complication: Secondary | ICD-10-CM | POA: Diagnosis not present

## 2022-05-25 DIAGNOSIS — R809 Proteinuria, unspecified: Secondary | ICD-10-CM | POA: Diagnosis not present

## 2022-05-26 ENCOUNTER — Encounter: Payer: Self-pay | Admitting: Cardiology

## 2022-05-26 ENCOUNTER — Ambulatory Visit: Payer: Medicare Other | Attending: Cardiology | Admitting: Cardiology

## 2022-05-26 VITALS — BP 128/78 | HR 60 | Ht 67.0 in | Wt 163.4 lb

## 2022-05-26 DIAGNOSIS — I5022 Chronic systolic (congestive) heart failure: Secondary | ICD-10-CM

## 2022-05-26 DIAGNOSIS — I10 Essential (primary) hypertension: Secondary | ICD-10-CM | POA: Diagnosis not present

## 2022-05-26 DIAGNOSIS — E782 Mixed hyperlipidemia: Secondary | ICD-10-CM

## 2022-05-26 DIAGNOSIS — I2510 Atherosclerotic heart disease of native coronary artery without angina pectoris: Secondary | ICD-10-CM

## 2022-05-26 NOTE — Progress Notes (Signed)
Clinical Summary Omar Williams is a 87 y.o.male seen today for follow up of the following medical problems.    1. ICM/Chronic systolic HF/CAD - history of LAD disease without target for revasc   - has BiV AICD, 04/2021 normal device check - 06/2017 echo LVEF 40-45%.  - medical therapy has been limited by low bp's. Has had some prior issues with hyperkalemia in the past     - had been on coreg 6.25mg  bid and losartan 25mg  daily at 07/2020 visit. Somewhere betwee June and July meds changed and now just on lisinopril 2.5mg  daily. Im not clear on this history.  - takes lasix 20mg  daily   Jan 2023 echo: LVEF 50%, grade I dd - he is on lisinopril, jardiance     - no chest pain, no SOB/DOE - compliant with meds - 04/2022 device check normal fucniton   2. HTN - compliant with meds     3. Hyperlipidemia - he is on atorva 80mg  and zetia 10mg     04/2021 TC 133 TG 78 HDL 57 LDL 61 - 10/2021 TC 161 TG 81 HDL 63 LDL 84 01/2022 TC 151 TG 100 HDL 61 LDL 72   4. CKD - followed by Dr Wolfgang Phoenix Past Medical History:  Diagnosis Date   Acid reflux    Blood transfusion without reported diagnosis    Cardiac arrest [facility])    a. occuring in 12/2016. LAD disease but no targets for CABG or PCI. Underwent ICD placement as EF 15-20%.    CHF (congestive heart failure) (HCC)    Coronary artery disease    Diabetes mellitus without complication (HCC)    Diabetic nephropathy (HCC)    Hyperlipidemia    Hypertension    Myocardial infarction [facility])    Renal disorder      No Known Allergies   Current Outpatient Medications  Medication Sig Dispense Refill   aspirin 81 MG chewable tablet Chew 81 mg by mouth daily.     Bee Pollen 550 MG CAPS Take 2 capsules by mouth 2 (two) times daily.     chlorhexidine (PERIDEX) 0.12 % solution as needed.     Cholecalciferol (VITAMIN D3) 125 MCG (5000 UT) TABS Take 1 tablet by mouth daily.      CINNAMON PO Take 1,000 mg by mouth in the morning and at bedtime.      dapagliflozin propanediol (FARXIGA) 10 MG TABS tablet Take 1 tablet (10 mg total) by mouth daily before breakfast. 21 tablet 0   docusate sodium (COLACE) 50 MG capsule Take 100 mg by mouth 2 (two) times daily.     ezetimibe (ZETIA) 10 MG tablet TAKE 1 TABLET BY MOUTH EVERY DAY 90 tablet 1   finasteride (PROSCAR) 5 MG tablet TAKE 1 TABLET BY MOUTH EVERYDAY AT BEDTIME 90 tablet 3   furosemide (LASIX) 20 MG tablet Take 1 tablet (20 mg total) by mouth daily as needed for edema. 90 tablet 1   glimepiride (AMARYL) 4 MG tablet Take 1 tablet (4 mg total) by mouth in the morning and at bedtime. 180 tablet 0   glucose blood (ONETOUCH ULTRA) test strip Test BS daily Dx E11.9 100 strip 3   lisinopril (ZESTRIL) 2.5 MG tablet Take 1 tablet (2.5 mg total) by mouth daily. 90 tablet 1   nitroGLYCERIN (NITROSTAT) 0.4 MG SL tablet Place 1 tablet (0.4 mg total) under the tongue every 5 (five) minutes x 3 doses as needed for chest pain. 25 tablet 3  Omega-3 Fatty Acids (FISH OIL) 1200 MG CAPS Take 1 capsule by mouth 2 (two) times daily.      oxybutynin (DITROPAN) 5 MG tablet Take 0.5 tablets (2.5 mg total) by mouth 3 (three) times daily. 135 each 3   pantoprazole (PROTONIX) 40 MG tablet Take 1 tablet (40 mg total) by mouth daily. 90 tablet 1   rosuvastatin (CRESTOR) 40 MG tablet TAKE 1 TABLET BY MOUTH DAILY 90 tablet 0   tamsulosin (FLOMAX) 0.4 MG CAPS capsule TAKE 1 CAPSULE (0.4 MG TOTAL) BY MOUTH EVERY MORNING. 90 capsule 3   TURMERIC PO Take 1,000 mg by mouth 2 (two) times daily.     vitamin C (ASCORBIC ACID) 500 MG tablet Take 1,000 mg by mouth 2 (two) times daily.     No current facility-administered medications for this visit.     Past Surgical History:  Procedure Laterality Date   BIV ICD INSERTION CRT-D N/A 01/01/2017   Procedure: BIV ICD INSERTION CRT-D;  Surgeon: Regan Lemming, MD;  Location: [facility] INVASIVE CV LAB;  Service: Cardiovascular;  Laterality: N/A;     No Known Allergies    Family  History  Problem Relation Age of Onset   Hypertension Mother    Hyperlipidemia Mother    CAD Sister    Diabetes Sister    Hyperlipidemia Sister    Hypertension Sister    CAD Brother    Diabetes Brother    Hypertension Brother    Hyperlipidemia Brother    CAD Brother    Diabetes Brother    Hypertension Brother    Hyperlipidemia Brother    Dementia Brother    Thyroid disease Daughter    Hypertension Daughter    Diabetes Daughter    Hyperlipidemia Daughter    Fibromyalgia Daughter    Heart disease Daughter        stents   Multiple sclerosis Daughter    Thyroid disease Daughter      Social History [name] reports that he quit smoking about 64 years ago. His smoking use included cigarettes. He has never been exposed to tobacco smoke. He has never used smokeless tobacco. [name] reports no history of alcohol use.   Review of Systems CONSTITUTIONAL: No weight loss, fever, chills, weakness or fatigue.  HEENT: Eyes: No visual loss, blurred vision, double vision or yellow sclerae.No hearing loss, sneezing, congestion, runny nose or sore throat.  SKIN: No rash or itching.  CARDIOVASCULAR: per hpi RESPIRATORY: No shortness of breath, cough or sputum.  GASTROINTESTINAL: No anorexia, nausea, vomiting or diarrhea. No abdominal pain or blood.  GENITOURINARY: No burning on urination, no polyuria NEUROLOGICAL: No headache, dizziness, syncope, paralysis, ataxia, numbness or tingling in the extremities. No change in bowel or bladder control.  MUSCULOSKELETAL: No muscle, back pain, joint pain or stiffness.  LYMPHATICS: No enlarged nodes. No history of splenectomy.  PSYCHIATRIC: No history of depression or anxiety.  ENDOCRINOLOGIC: No reports of sweating, cold or heat intolerance. No polyuria or polydipsia.  Marland Kitchen   Physical Examination Today's Vitals   05/26/22 0806  BP: 128/78  Pulse: 60  SpO2: 98%  Weight: 163 lb 6.4 oz (74.1 kg)  Height:  (1.702 m)   Body mass index is  25.59 kg/m.  Gen: resting comfortably, no acute distress HEENT: no scleral icterus, pupils equal round and reactive, no palptable cervical adenopathy,  CV: RRR, no mrg, no jvd Resp: Clear to auscultation bilaterally GI: abdomen is soft, non-tender, non-distended, normal bowel sounds, no hepatosplenomegaly MSK: extremities  are warm, no edema.  Skin: warm, no rash Neuro:  no focal deficits Psych: appropriate affect   Diagnostic Studies  Echo 06/09/17:   Study Conclusions   - Left ventricle: The cavity size was normal. Wall thickness was   normal. Systolic function was mildly to moderately reduced. The   estimated ejection fraction was in the range of 40% to 45%.   Diffuse hypokinesis. Doppler parameters are consistent with   abnormal left ventricular relaxation (grade 1 diastolic   dysfunction). - Aortic valve: Poorly visualized. Probably trileaflet. There was   mild regurgitation. - Mitral valve: There was trivial regurgitation. - Left atrium: The atrium was mildly dilated. - Right ventricle: Pacer wire or catheter noted in right ventricle. - Right atrium: Central venous pressure (est): 3 mm Hg. - Atrial septum: No defect or patent foramen ovale was identified. - Tricuspid valve: There was trivial regurgitation. - Pulmonary arteries: PA peak pressure: 13 mm Hg (S). - Pericardium, extracardiac: There was no pericardial effusion.   Impressions:   - Images are limited, but LVEF has improved in comparison to the   previous study in November 2018.   06/2017 echo Study Conclusions   - Left ventricle: The cavity size was normal. Wall thickness was    normal. Systolic function was mildly to moderately reduced. The    estimated ejection fraction was in the range of 40% to 45%.    Diffuse hypokinesis. Doppler parameters are consistent with    abnormal left ventricular relaxation (grade 1 diastolic    dysfunction).  - Aortic valve: Poorly visualized. Probably trileaflet. There was     mild regurgitation.  - Mitral valve: There was trivial regurgitation.  - Left atrium: The atrium was mildly dilated.  - Right ventricle: Pacer wire or catheter noted in right ventricle.  - Right atrium: Central venous pressure (est): 3 mm Hg.  - Atrial septum: No defect or patent foramen ovale was identified.  - Tricuspid valve: There was trivial regurgitation.  - Pulmonary arteries: PA peak pressure: 13 mm Hg (S).  - Pericardium, extracardiac: There was no pericardial effusion.   Jan 2023 echo 1. Left ventricular ejection fraction, by estimation, is 50%. The left  ventricle has low normal function. The left ventricle has no regional wall  motion abnormalities. There is moderate left ventricular hypertrophy. Left  ventricular diastolic parameters   are consistent with Grade I diastolic dysfunction (impaired relaxation).   2. Right ventricular systolic function is normal. The right ventricular  size is normal. There is normal pulmonary artery systolic pressure.   3. Left atrial size was moderately dilated.   4. The mitral valve is normal in structure. No evidence of mitral valve  regurgitation. No evidence of mitral stenosis.   5. The tricuspid valve is abnormal.   6. The aortic valve was not well visualized. Aortic valve regurgitation  is mild.   7. The inferior vena cava is normal in size with greater than 50%  respiratory variability, suggesting right atrial pressure of 3 mmHg.        Assessment and Plan   1. CAD/Chronic systolic HF/ICM - most recent echo shows LVEF has normalized at 50% -  At some point beta blocker was stopped by another provider, I assume due to low HRs as resting HR today is 60.  - no symptoms, continue curren tmeds   2. Hyperlipidemia -LDL essentially at goal, conitnue current meds   3. HTN - bp is at goal, continue current meds  F/u 6  months     Antoine Poche, M.D.

## 2022-05-26 NOTE — Patient Instructions (Signed)
Medication Instructions:  Continue all current medications.   Labwork: none  Testing/Procedures: none  Follow-Up: 6 months   Any Other Special Instructions Will Be Listed Below (If Applicable).   If you need a refill on your cardiac medications before your next appointment, please call your pharmacy.  

## 2022-06-01 DIAGNOSIS — R809 Proteinuria, unspecified: Secondary | ICD-10-CM | POA: Diagnosis not present

## 2022-06-01 DIAGNOSIS — E1129 Type 2 diabetes mellitus with other diabetic kidney complication: Secondary | ICD-10-CM | POA: Diagnosis not present

## 2022-06-01 DIAGNOSIS — N189 Chronic kidney disease, unspecified: Secondary | ICD-10-CM | POA: Diagnosis not present

## 2022-06-01 DIAGNOSIS — E1122 Type 2 diabetes mellitus with diabetic chronic kidney disease: Secondary | ICD-10-CM | POA: Diagnosis not present

## 2022-06-02 ENCOUNTER — Ambulatory Visit: Payer: Medicare Other | Attending: Internal Medicine

## 2022-06-02 DIAGNOSIS — Z95810 Presence of automatic (implantable) cardiac defibrillator: Secondary | ICD-10-CM | POA: Diagnosis not present

## 2022-06-02 DIAGNOSIS — I5022 Chronic systolic (congestive) heart failure: Secondary | ICD-10-CM

## 2022-06-02 NOTE — Progress Notes (Signed)
EPIC Encounter for ICM Monitoring  Patient Name: Omar Williams is a 87 y.o. male Date: 06/02/2022 Primary Care Physican: Sonny Masters, FNP Primary Cardiologist:  Wyline Mood Electrophysiologist: Ladona Ridgel Nephrologist: [facility] Bi-V Pacing: 98.3% 10/22/2021 Weight: 161-162 lbs 04/27/2022 Weight: 160 lbs  Since 06-May-2022 Time in AT/AF  0.4 hr/day (1.8%) Longest AT/AF  17 hours   Spoke with patient and heart failure questions reviewed.  Transmission results reviewed.  Pt asymptomatic for fluid accumulation.    Optivol Thoracic impedance suggesting fluid levels returned to normal.   AT/AF addressed in 4/4 phone encounter.   Prescribed:  Furosemide 20 mg take 1 tablet as needed for swelling.     Labs: 10/09/2021 Creatinine 1.39, BUN 28, Potassium 4.4, Sodium 141, GFR 50 07/17/2021 Creatinine 1.44, BUN 36, Potassium 4.6, Sodium 141, GFR 48 04/15/2021 Creatinine 1.41, BUN 22, Potassium 4.5, Sodium 144, GFR 49 A complete set of results can be found in Results Review.   Recommendations:   No changes and encouraged to call if experiencing any fluid symptoms.   Follow-up plan: ICM clinic phone appointment on 07/06/2022.   91 day device clinic remote transmission 07/08/2022.       EP/Cardiology Office Visits:  5/3/204 with Dr Nelly Laurence for Afib/yearly follow up.  11/30/2022 with Dr Wyline Mood.  08/18/2022 with Dr Ladona Ridgel.        Copy of ICM check sent to Dr. Ladona Ridgel.     3 month ICM trend: 06/01/2022.    12-14 Month ICM trend:     Karie Soda, RN 06/02/2022 3:37 PM

## 2022-06-05 ENCOUNTER — Ambulatory Visit: Payer: Medicare Other | Attending: Internal Medicine | Admitting: Cardiovascular Disease

## 2022-06-05 ENCOUNTER — Encounter: Payer: Self-pay | Admitting: Cardiovascular Disease

## 2022-06-05 VITALS — BP 106/62 | HR 60 | Ht 67.0 in | Wt 165.0 lb

## 2022-06-05 DIAGNOSIS — Z95810 Presence of automatic (implantable) cardiac defibrillator: Secondary | ICD-10-CM | POA: Diagnosis not present

## 2022-06-05 DIAGNOSIS — I5022 Chronic systolic (congestive) heart failure: Secondary | ICD-10-CM | POA: Diagnosis not present

## 2022-06-05 NOTE — Progress Notes (Signed)
Electrophysiology Office Note:    Date:  06/05/2022   ID:  Omar Williams, DOB Oct 03, 1935, MRN [id number]  PCP:  Sonny Masters, FNP   [facility] Providers Cardiologist:  Dina Rich, MD Cardiology APP:  Netta Neat., NP (Inactive)  Electrophysiologist:  Maurice Small, MD     Referring MD: Sonny Masters, FNP   History of Present Illness:    Omar Williams is a 87 y.o. male with a hx listed below, significant for ischemic cardiomyopathy, VF arrest, referred for arrhythmia management.  His device interrogation today shows a single episode of atrial fibrillation on April 3.  He was traveling at the time and under more stress than usual.  He was not aware of any rhythm abnormality.  Today he reports he is doing well and has no complaints. he has no device related complaints -- no new tenderness, drainage, redness.    Past Medical History:  Diagnosis Date   Acid reflux    Blood transfusion without reported diagnosis    Cardiac arrest (HCC)    a. occuring in 12/2016. LAD disease but no targets for CABG or PCI. Underwent ICD placement as EF 15-20%.    CHF (congestive heart failure) (HCC)    Coronary artery disease    Diabetes mellitus without complication (HCC)    Diabetic nephropathy (HCC)    Hyperlipidemia    Hypertension    Myocardial infarction [facility])    Renal disorder     Past Surgical History:  Procedure Laterality Date   BIV ICD INSERTION CRT-D N/A 01/01/2017   Procedure: BIV ICD INSERTION CRT-D;  Surgeon: Regan Lemming, MD;  Location: [facility] INVASIVE CV LAB;  Service: Cardiovascular;  Laterality: N/A;    Current Medications: Current Meds  Medication Sig   aspirin 81 MG chewable tablet Chew 81 mg by mouth daily.   Bee Pollen 550 MG CAPS Take 2 capsules by mouth 2 (two) times daily.   chlorhexidine (PERIDEX) 0.12 % solution as needed.   Cholecalciferol (VITAMIN D3) 125 MCG (5000 UT) TABS Take 1 tablet by mouth daily.    CINNAMON PO Take 2,000  mg by mouth in the morning and at bedtime.   cyanocobalamin (VITAMIN B12) 1000 MCG tablet Take 1,000 mcg by mouth 2 (two) times daily.   dapagliflozin propanediol (FARXIGA) 10 MG TABS tablet Take 1 tablet (10 mg total) by mouth daily before breakfast.   docusate sodium (COLACE) 50 MG capsule Take 100 mg by mouth 2 (two) times daily.   ezetimibe (ZETIA) 10 MG tablet TAKE 1 TABLET BY MOUTH EVERY DAY   finasteride (PROSCAR) 5 MG tablet TAKE 1 TABLET BY MOUTH EVERYDAY AT BEDTIME   furosemide (LASIX) 20 MG tablet Take 1 tablet (20 mg total) by mouth daily as needed for edema.   glimepiride (AMARYL) 4 MG tablet Take 1 tablet (4 mg total) by mouth in the morning and at bedtime.   glucose blood (ONETOUCH ULTRA) test strip Test BS daily Dx E11.9   lisinopril (ZESTRIL) 2.5 MG tablet Take 1 tablet (2.5 mg total) by mouth daily.   nitroGLYCERIN (NITROSTAT) 0.4 MG SL tablet Place 1 tablet (0.4 mg total) under the tongue every 5 (five) minutes x 3 doses as needed for chest pain.   Omega-3 Fatty Acids (FISH OIL) 1200 MG CAPS Take 1 capsule by mouth 2 (two) times daily.    oxybutynin (DITROPAN) 5 MG tablet Take 0.5 tablets (2.5 mg total) by mouth 3 (three) times daily.   pantoprazole (  PROTONIX) 40 MG tablet Take 1 tablet (40 mg total) by mouth daily.   rosuvastatin (CRESTOR) 40 MG tablet TAKE 1 TABLET BY MOUTH DAILY   tamsulosin (FLOMAX) 0.4 MG CAPS capsule TAKE 1 CAPSULE (0.4 MG TOTAL) BY MOUTH EVERY MORNING.   TURMERIC PO Take 1,000 mg by mouth 2 (two) times daily.   vitamin C (ASCORBIC ACID) 500 MG tablet Take 1,000 mg by mouth 2 (two) times daily.     Allergies:   Patient has no known allergies.   Social and Family History: Reviewed in Epic  ROS:   Please see the history of present illness.    All other systems reviewed and are negative.  EKGs/Labs/Other Studies Reviewed Today:    Echocardiogram:  02/10/2021 EF 50%, moderately dilated LA   Monitors:   Stress testing:   Advanced  imaging:   EKG:  Last EKG results: Today -- A-V paced rhythm, reviewed by me   Recent Labs: 04/29/2022: ALT 17; BUN 27; Creatinine, Ser 1.50; Hemoglobin 15.9; Platelets 237; Potassium 3.8; Sodium 143     Physical Exam:    VS:  BP 106/62   Pulse 60   Ht 5\' 7"  (1.702 m)   Wt 165 lb (74.8 kg)   SpO2 97%   BMI 25.84 kg/m     Wt Readings from Last 3 Encounters:  06/05/22 165 lb (74.8 kg)  05/26/22 163 lb 6.4 oz (74.1 kg)  04/29/22 158 lb 12.8 oz (72 kg)     GEN:  Well nourished, well developed in no acute distress CARDIAC: RRR, no murmurs, rubs, gallops The device site is normal -- no tenderness, edema, drainage, redness, threatened erosion. RESPIRATORY:  Normal work of breathing MUSCULOSKELETAL: no edema    ASSESSMENT & PLAN:    [employer] BiV ICD Normal function He is not device dependent today  History of VF arrest No further episodes ICD in place and functioning normally  Chronic systolic heart failure Continue dapagliflozin 10, furosemide 20, lisinopril 2.5  Single episode of atrial fibrillation Because he has only 1 episode of atrial fibrillation, I am not going to give him a diagnosis of chronic atrial fibrillation at this time We will continue to follow-up his remote device checks, AF alerts on Anticoagulation will need to be started if he has recurrence         Medication Adjustments/Labs and Tests Ordered: Current medicines are reviewed at length with the patient today.  Concerns regarding medicines are outlined above.  Orders Placed This Encounter  Procedures   EKG 12-Lead   No orders of the defined types were placed in this encounter.    Signed, Maurice Small, MD  06/05/2022 10:56 AM    [facility]

## 2022-06-05 NOTE — Patient Instructions (Signed)
Medication Instructions:  Continue all current medications.  Labwork: none  Testing/Procedures: none  Follow-Up: 1 year - Dr.  Mealor   Any Other Special Instructions Will Be Listed Below (If Applicable).   If you need a refill on your cardiac medications before your next appointment, please call your pharmacy.; 

## 2022-06-08 LAB — CUP PACEART INCLINIC DEVICE CHECK
Battery Remaining Longevity: 24 mo
Battery Voltage: 2.93 V
Brady Statistic AP VP Percent: 47.81 %
Brady Statistic AP VS Percent: 0.03 %
Brady Statistic AS VP Percent: 51.3 %
Brady Statistic AS VS Percent: 0.86 %
Brady Statistic RA Percent Paced: 47.74 %
Brady Statistic RV Percent Paced: 94.41 %
Date Time Interrogation Session: 20240503123431
HighPow Impedance: 83 Ohm
Implantable Lead Connection Status: 753985
Implantable Lead Connection Status: 753985
Implantable Lead Connection Status: 753985
Implantable Lead Implant Date: 20181130
Implantable Lead Implant Date: 20181130
Implantable Lead Implant Date: 20181130
Implantable Lead Location: 753858
Implantable Lead Location: 753859
Implantable Lead Location: 753860
Implantable Lead Model: 4598
Implantable Lead Model: 5076
Implantable Lead Model: 6935
Implantable Pulse Generator Implant Date: 20181130
Lead Channel Impedance Value: 152 Ohm
Lead Channel Impedance Value: 152 Ohm
Lead Channel Impedance Value: 152 Ohm
Lead Channel Impedance Value: 152 Ohm
Lead Channel Impedance Value: 152 Ohm
Lead Channel Impedance Value: 304 Ohm
Lead Channel Impedance Value: 304 Ohm
Lead Channel Impedance Value: 304 Ohm
Lead Channel Impedance Value: 304 Ohm
Lead Channel Impedance Value: 342 Ohm
Lead Channel Impedance Value: 380 Ohm
Lead Channel Impedance Value: 494 Ohm
Lead Channel Impedance Value: 494 Ohm
Lead Channel Impedance Value: 513 Ohm
Lead Channel Impedance Value: 532 Ohm
Lead Channel Impedance Value: 570 Ohm
Lead Channel Impedance Value: 627 Ohm
Lead Channel Impedance Value: 665 Ohm
Lead Channel Pacing Threshold Amplitude: 0.375 V
Lead Channel Pacing Threshold Amplitude: 0.625 V
Lead Channel Pacing Threshold Amplitude: 1.375 V
Lead Channel Pacing Threshold Pulse Width: 0.4 ms
Lead Channel Pacing Threshold Pulse Width: 0.4 ms
Lead Channel Pacing Threshold Pulse Width: 0.4 ms
Lead Channel Sensing Intrinsic Amplitude: 2.75 mV
Lead Channel Sensing Intrinsic Amplitude: 2.875 mV
Lead Channel Sensing Intrinsic Amplitude: 9.125 mV
Lead Channel Sensing Intrinsic Amplitude: 9.5 mV
Lead Channel Setting Pacing Amplitude: 2 V
Lead Channel Setting Pacing Amplitude: 2 V
Lead Channel Setting Pacing Amplitude: 2.5 V
Lead Channel Setting Pacing Pulse Width: 0.4 ms
Lead Channel Setting Pacing Pulse Width: 0.4 ms
Lead Channel Setting Sensing Sensitivity: 0.3 mV
Zone Setting Status: 755011
Zone Setting Status: 755011

## 2022-06-22 ENCOUNTER — Telehealth: Payer: Self-pay | Admitting: Cardiovascular Disease

## 2022-06-22 ENCOUNTER — Ambulatory Visit (INDEPENDENT_AMBULATORY_CARE_PROVIDER_SITE_OTHER): Payer: Medicare Other

## 2022-06-22 VITALS — Ht 67.0 in | Wt 162.0 lb

## 2022-06-22 DIAGNOSIS — Z0000 Encounter for general adult medical examination without abnormal findings: Secondary | ICD-10-CM

## 2022-06-22 NOTE — Telephone Encounter (Signed)
Spoke to the patient,he received a bill from [facility] and listed was EKG that he is responsible for. Explained to the patient per [facility] billing:   It is still pending in insurance.  It must be in review so there is no patient responsibility at this time.   Patient voiced understanding.

## 2022-06-22 NOTE — Patient Instructions (Signed)
[name] , Thank you for taking time to come for your Medicare Wellness Visit. I appreciate your ongoing commitment to your health goals. Please review the following plan we discussed and let me know if I can assist you in the future.   These are the goals we discussed:  Goals       Activity and Exercise Increased      Evidence-based guidance:  Review current exercise levels.  Assess patient perspective on exercise or activity level, barriers to increasing activity, motivation and readiness for change.  Recommend or set healthy exercise goal based on individual tolerance.  Encourage small steps toward making change in amount of exercise or activity.  Urge reduction of sedentary activities or screen time.  Promote group activities within the community or with family or support person.  Consider referral to rehabiliation therapist for assessment and exercise/activity plan.   Notes:       HEMOGLOBIN A1C < 7.0      LDL CALC < 70      T2DM PHARMD GOAL (pt-stated)      Current Barriers:  Unable to independently afford treatment regimen Unable to achieve control of T2DM  Suboptimal therapeutic regimen for T2DM   Pharmacist Clinical Goal(s):  patient will verbalize ability to afford treatment regimen achieve control of T2DM as evidenced by GOAL A1C, IMPROVED GLYCEMIC CONTROL through collaboration with PharmD and provider.    Interventions: 1:1 collaboration with Omar Fudge, FNP regarding development and update of comprehensive plan of care as evidenced by provider attestation and co-signature Inter-disciplinary care team collaboration (see longitudinal plan of care) Comprehensive medication review performed; medication list updated in electronic medical record  Diabetes: New goal. Uncontrolled-A1C 7.8%, GFR 48; current treatment:FARXIGA 10MG , GLIMEPIRIDE (discontinue);  A1C okay given age (87) Additional of farxiga will help Patient down to 1/2 tab of glimepiride--recommended  stopping this Current glucose readings: fasting glucose: <135, post prandial glucose: N/A Denies hypoglycemic/hyperglycemic symptoms Discussed meal planning options and Plate method for healthy eating Avoid sugary drinks and desserts Incorporate balanced protein, non starchy veggies, 1 serving of carbohydrate with each meal Increase water intake Increase physical activity as able Current exercise: N/A Recommended d/c glimepiride Assessed patient finances. Enroll in [place] patient assistance for Farxiga  Patient Goals/Self-Care Activities patient will:  - take medications as prescribed as evidenced by patient report and record review check glucose daily , document, and provide at future appointments collaborate with provider on medication access solutions         This is a list of the screening recommended for you and due dates:  Health Maintenance  Topic Date Due   COVID-19 Vaccine (1) Never done   Flu Shot  09/03/2022   Yearly kidney health urinalysis for diabetes  10/10/2022   Complete foot exam   10/10/2022   Hemoglobin A1C  10/30/2022   Eye exam for diabetics  11/18/2022   Yearly kidney function blood test for diabetes  04/29/2023   Medicare Annual Wellness Visit  06/22/2023   DTaP/Tdap/Td vaccine (6 - Td or Tdap) 09/12/2028   Pneumonia Vaccine  Completed   Zoster (Shingles) Vaccine  Completed   HPV Vaccine  Aged Out    Advanced directives: Advance directive discussed with you today. I have provided a copy for you to complete at home and have notarized. Once this is complete please bring a copy in to our office so we can scan it into your chart.   Conditions/risks identified: Aim for 30 minutes of exercise or  brisk walking, 6-8 glasses of water, and 5 servings of fruits and vegetables each day.   Next appointment: Follow up in one year for your annual wellness visit.   Preventive Care 80 Years and Older, Male  Preventive care refers to lifestyle choices and visits  with your health care provider that can promote health and wellness. What does preventive care include? A yearly physical exam. This is also called an annual well check. Dental exams once or twice a year. Routine eye exams. Ask your health care provider how often you should have your eyes checked. Personal lifestyle choices, including: Daily care of your teeth and gums. Regular physical activity. Eating a healthy diet. Avoiding tobacco and drug use. Limiting alcohol use. Practicing safe sex. Taking low doses of aspirin every day. Taking vitamin and mineral supplements as recommended by your health care provider. What happens during an annual well check? The services and screenings done by your health care provider during your annual well check will depend on your age, overall health, lifestyle risk factors, and family history of disease. Counseling  Your health care provider may ask you questions about your: Alcohol use. Tobacco use. Drug use. Emotional well-being. Home and relationship well-being. Sexual activity. Eating habits. History of falls. Memory and ability to understand (cognition). Work and work Astronomer. Screening  You may have the following tests or measurements: Height, weight, and BMI. Blood pressure. Lipid and cholesterol levels. These may be checked every 5 years, or more frequently if you are over 20 years old. Skin check. Lung cancer screening. You may have this screening every year starting at age 46 if you have a 30-pack-year history of smoking and currently smoke or have quit within the past 15 years. Fecal occult blood test (FOBT) of the stool. You may have this test every year starting at age 42. Flexible sigmoidoscopy or colonoscopy. You may have a sigmoidoscopy every 5 years or a colonoscopy every 10 years starting at age 40. Prostate cancer screening. Recommendations will vary depending on your family history and other risks. Hepatitis C blood  test. Hepatitis B blood test. Sexually transmitted disease (STD) testing. Diabetes screening. This is done by checking your blood sugar (glucose) after you have not eaten for a while (fasting). You may have this done every 1-3 years. Abdominal aortic aneurysm (AAA) screening. You may need this if you are a current or former smoker. Osteoporosis. You may be screened starting at age 19 if you are at high risk. Talk with your health care provider about your test results, treatment options, and if necessary, the need for more tests. Vaccines  Your health care provider may recommend certain vaccines, such as: Influenza vaccine. This is recommended every year. Tetanus, diphtheria, and acellular pertussis (Tdap, Td) vaccine. You may need a Td booster every 10 years. Zoster vaccine. You may need this after age 58. Pneumococcal 13-valent conjugate (PCV13) vaccine. One dose is recommended after age 14. Pneumococcal polysaccharide (PPSV23) vaccine. One dose is recommended after age 27. Talk to your health care provider about which screenings and vaccines you need and how often you need them. This information is not intended to replace advice given to you by your health care provider. Make sure you discuss any questions you have with your health care provider. Document Released: 02/15/2015 Document Revised: 10/09/2015 Document Reviewed: 11/20/2014 Elsevier Interactive Patient Education  2017 [employer].  Fall Prevention in the Home Falls can cause injuries. They can happen to people of all ages. There are many  things you can do to make your home safe and to help prevent falls. What can I do on the outside of my home? Regularly fix the edges of walkways and driveways and fix any cracks. Remove anything that might make you trip as you walk through a door, such as a raised step or threshold. Trim any bushes or trees on the path to your home. Use bright outdoor lighting. Clear any walking paths of  anything that might make someone trip, such as rocks or tools. Regularly check to see if handrails are loose or broken. Make sure that both sides of any steps have handrails. Any raised decks and porches should have guardrails on the edges. Have any leaves, snow, or ice cleared regularly. Use sand or salt on walking paths during winter. Clean up any spills in your garage right away. This includes oil or grease spills. What can I do in the bathroom? Use night lights. Install grab bars by the toilet and in the tub and shower. Do not use towel bars as grab bars. Use non-skid mats or decals in the tub or shower. If you need to sit down in the shower, use a plastic, non-slip stool. Keep the floor dry. Clean up any water that spills on the floor as soon as it happens. Remove soap buildup in the tub or shower regularly. Attach bath mats securely with double-sided non-slip rug tape. Do not have throw rugs and other things on the floor that can make you trip. What can I do in the bedroom? Use night lights. Make sure that you have a light by your bed that is easy to reach. Do not use any sheets or blankets that are too big for your bed. They should not hang down onto the floor. Have a firm chair that has side arms. You can use this for support while you get dressed. Do not have throw rugs and other things on the floor that can make you trip. What can I do in the kitchen? Clean up any spills right away. Avoid walking on wet floors. Keep items that you use a lot in easy-to-reach places. If you need to reach something above you, use a strong step stool that has a grab bar. Keep electrical cords out of the way. Do not use floor polish or wax that makes floors slippery. If you must use wax, use non-skid floor wax. Do not have throw rugs and other things on the floor that can make you trip. What can I do with my stairs? Do not leave any items on the stairs. Make sure that there are handrails on both  sides of the stairs and use them. Fix handrails that are broken or loose. Make sure that handrails are as long as the stairways. Check any carpeting to make sure that it is firmly attached to the stairs. Fix any carpet that is loose or worn. Avoid having throw rugs at the top or bottom of the stairs. If you do have throw rugs, attach them to the floor with carpet tape. Make sure that you have a light switch at the top of the stairs and the bottom of the stairs. If you do not have them, ask someone to add them for you. What else can I do to help prevent falls? Wear shoes that: Do not have high heels. Have rubber bottoms. Are comfortable and fit you well. Are closed at the toe. Do not wear sandals. If you use a stepladder: Make sure that it  is fully opened. Do not climb a closed stepladder. Make sure that both sides of the stepladder are locked into place. Ask someone to hold it for you, if possible. Clearly mark and make sure that you can see: Any grab bars or handrails. First and last steps. Where the edge of each step is. Use tools that help you move around (mobility aids) if they are needed. These include: Canes. Walkers. Scooters. Crutches. Turn on the lights when you go into a dark area. Replace any light bulbs as soon as they burn out. Set up your furniture so you have a clear path. Avoid moving your furniture around. If any of your floors are uneven, fix them. If there are any pets around you, be aware of where they are. Review your medicines with your doctor. Some medicines can make you feel dizzy. This can increase your chance of falling. Ask your doctor what other things that you can do to help prevent falls. This information is not intended to replace advice given to you by your health care provider. Make sure you discuss any questions you have with your health care provider. Document Released: 11/15/2008 Document Revised: 06/27/2015 Document Reviewed: 02/23/2014 Elsevier  Interactive Patient Education  2017 [employer].

## 2022-06-22 NOTE — Telephone Encounter (Signed)
Patient stated that got a letter from [facility] about his bill for the EKG being denied. Please advise.

## 2022-06-22 NOTE — Progress Notes (Signed)
Subjective:   Omar Williams is a 87 y.o. male who presents for Medicare Annual/Subsequent preventive examination. I connected with  Aaven Biggio on 06/22/22 by a audio enabled telemedicine application and verified that I am speaking with the correct person using two identifiers.  Patient Location: Home  Provider Location: Home Office  I discussed the limitations of evaluation and management by telemedicine. The patient expressed understanding and agreed to proceed.  Review of Systems     Cardiac Risk Factors include: advanced age (>58men, >76 women);male gender;dyslipidemia;diabetes mellitus;hypertension     Objective:    Today's Vitals   06/22/22 1523  Weight: 162 lb (73.5 kg)  Height: 5\' 7"  (1.702 m)   Body mass index is 25.37 kg/m.     06/22/2022    3:25 PM 06/16/2021    3:39 PM 06/12/2020    1:15 PM 06/02/2019    9:12 AM 01/25/2017    7:25 AM 12/30/2016   10:00 PM  Advanced Directives  Does Patient Have a Medical Advance Directive? Yes No No No Yes No  Type of Estate agent of [place];Living will    Healthcare Power of Attorney   Does patient want to make changes to medical advance directive? No - Patient declined       Copy of Healthcare Power of Attorney in Chart? Yes - validated most recent copy scanned in chart (See row information)       Would patient like information on creating a medical advance directive?  No - Patient declined No - Patient declined No - Patient declined  No - Patient declined    Current Medications (verified) Outpatient Encounter Medications as of 06/22/2022  Medication Sig   aspirin 81 MG chewable tablet Chew 81 mg by mouth daily.   Bee Pollen 550 MG CAPS Take 2 capsules by mouth 2 (two) times daily.   chlorhexidine (PERIDEX) 0.12 % solution as needed.   Cholecalciferol (VITAMIN D3) 125 MCG (5000 UT) TABS Take 1 tablet by mouth daily.    CINNAMON PO Take 2,000 mg by mouth in the morning and at bedtime.   cyanocobalamin  (VITAMIN B12) 1000 MCG tablet Take 1,000 mcg by mouth 2 (two) times daily.   dapagliflozin propanediol (FARXIGA) 10 MG TABS tablet Take 1 tablet (10 mg total) by mouth daily before breakfast.   docusate sodium (COLACE) 50 MG capsule Take 100 mg by mouth 2 (two) times daily.   ezetimibe (ZETIA) 10 MG tablet TAKE 1 TABLET BY MOUTH EVERY DAY   finasteride (PROSCAR) 5 MG tablet TAKE 1 TABLET BY MOUTH EVERYDAY AT BEDTIME   furosemide (LASIX) 20 MG tablet Take 1 tablet (20 mg total) by mouth daily as needed for edema.   glimepiride (AMARYL) 4 MG tablet Take 1 tablet (4 mg total) by mouth in the morning and at bedtime.   glucose blood (ONETOUCH ULTRA) test strip Test BS daily Dx E11.9   lisinopril (ZESTRIL) 2.5 MG tablet Take 1 tablet (2.5 mg total) by mouth daily.   nitroGLYCERIN (NITROSTAT) 0.4 MG SL tablet Place 1 tablet (0.4 mg total) under the tongue every 5 (five) minutes x 3 doses as needed for chest pain.   Omega-3 Fatty Acids (FISH OIL) 1200 MG CAPS Take 1 capsule by mouth 2 (two) times daily.    oxybutynin (DITROPAN) 5 MG tablet Take 0.5 tablets (2.5 mg total) by mouth 3 (three) times daily.   pantoprazole (PROTONIX) 40 MG tablet Take 1 tablet (40 mg total) by mouth daily.  rosuvastatin (CRESTOR) 40 MG tablet TAKE 1 TABLET BY MOUTH DAILY   tamsulosin (FLOMAX) 0.4 MG CAPS capsule TAKE 1 CAPSULE (0.4 MG TOTAL) BY MOUTH EVERY MORNING.   TURMERIC PO Take 1,000 mg by mouth 2 (two) times daily.   vitamin C (ASCORBIC ACID) 500 MG tablet Take 1,000 mg by mouth 2 (two) times daily.   No facility-administered encounter medications on file as of 06/22/2022.    Allergies (verified) Patient has no known allergies.   History: Past Medical History:  Diagnosis Date   Acid reflux    Blood transfusion without reported diagnosis    Cardiac arrest (HCC)    a. occuring in 12/2016. LAD disease but no targets for CABG or PCI. Underwent ICD placement as EF 15-20%.    CHF (congestive heart failure) (HCC)     Coronary artery disease    Diabetes mellitus without complication (HCC)    Diabetic nephropathy (HCC)    Hyperlipidemia    Hypertension    Myocardial infarction [facility])    Renal disorder    Past Surgical History:  Procedure Laterality Date   BIV ICD INSERTION CRT-D N/A 01/01/2017   Procedure: BIV ICD INSERTION CRT-D;  Surgeon: Regan Lemming, MD;  Location: [facility] INVASIVE CV LAB;  Service: Cardiovascular;  Laterality: N/A;   Family History  Problem Relation Age of Onset   Hypertension Mother    Hyperlipidemia Mother    CAD Sister    Diabetes Sister    Hyperlipidemia Sister    Hypertension Sister    CAD Brother    Diabetes Brother    Hypertension Brother    Hyperlipidemia Brother    CAD Brother    Diabetes Brother    Hypertension Brother    Hyperlipidemia Brother    Dementia Brother    Thyroid disease Daughter    Hypertension Daughter    Diabetes Daughter    Hyperlipidemia Daughter    Fibromyalgia Daughter    Heart disease Daughter        stents   Multiple sclerosis Daughter    Thyroid disease Daughter    Social History   Socioeconomic History   Marital status: Married    Spouse name: Haematologist   Number of children: 3   Years of education: 12   Highest education level: Some college, no degree  Occupational History   Occupation: Health and safety inspector   retired   Occupation: Education officer, environmental  Tobacco Use   Smoking status: Former    Types: Cigarettes    Quit date: 1960    Years since quitting: 64.4    Passive exposure: Never   Smokeless tobacco: Never   Tobacco comments:    2-3 years as a teenager  Building services engineer Use: Never used  Substance and Sexual Activity   Alcohol use: No   Drug use: No   Sexual activity: Not Currently  Other Topics Concern   Not on file  Social History Narrative   Lives with with wife [name]   Married 60 years   Daughter [name] stays frequently   Granddaughter [name] near by   Social Determinants of Health   Financial Resource  Strain: Low Risk  (06/22/2022)   Overall Financial Resource Strain (CARDIA)    Difficulty of Paying Living Expenses: Not hard at all  Food Insecurity: No Food Insecurity (06/22/2022)   Hunger Vital Sign    Worried About Running Out of Food in the Last Year: Never true    Ran Out of Food in the  Last Year: Never true  Transportation Needs: No Transportation Needs (06/22/2022)   PRAPARE - Administrator, Civil Service (Medical): No    Lack of Transportation (Non-Medical): No  Physical Activity: Sufficiently Active (06/22/2022)   Exercise Vital Sign    Days of Exercise per Week: 7 days    Minutes of Exercise per Session: 30 min  Stress: No Stress Concern Present (06/22/2022)   [employer] of Occupational Health - Occupational Stress Questionnaire    Feeling of Stress : Not at all  Social Connections: Socially Integrated (06/22/2022)   Social Connection and Isolation Panel [NHANES]    Frequency of Communication with Friends and Family: More than three times a week    Frequency of Social Gatherings with Friends and Family: More than three times a week    Attends Religious Services: More than 4 times per year    Active Member of [employer] or Organizations: Yes    Attends Engineer, structural: More than 4 times per year    Marital Status: Married    Tobacco Counseling Counseling given: Not Answered Tobacco comments: 2-3 years as a teenager   Clinical Intake:  Pre-visit preparation completed: Yes  Pain : No/denies pain     Nutritional Risks: None Diabetes: Yes CBG done?: No Did pt. bring in CBG monitor from home?: No  How often do you need to have someone help you when you read instructions, pamphlets, or other written materials from your doctor or pharmacy?: 1 - Never  Diabetic?yes Nutrition Risk Assessment:  Has the patient had any N/V/D within the last 2 months?  No  Does the patient have any non-healing wounds?  No  Has the patient had any unintentional  weight loss or weight gain?  No   Diabetes:  Is the patient diabetic?  Yes  If diabetic, was a CBG obtained today?  No  Did the patient bring in their glucometer from home?  No  How often do you monitor your CBG's? Daily .   Financial Strains and Diabetes Management:  Are you having any financial strains with the device, your supplies or your medication? No .  Does the patient want to be seen by Chronic Care Management for management of their diabetes?  No  Would the patient like to be referred to a Nutritionist or for Diabetic Management?  No   Diabetic Exams:  Diabetic Eye Exam: Completed 11/2022 Diabetic Foot Exam: Overdue, Pt has been advised about the importance in completing this exam. Pt is scheduled for diabetic foot exam on next office visit .   Interpreter Needed?: No  Information entered by :: Renie Ora, LPN   Activities of Daily Living    06/22/2022    3:26 PM  In your present state of health, do you have any difficulty performing the following activities:  Hearing? 0  Vision? 0  Difficulty concentrating or making decisions? 0  Walking or climbing stairs? 0  Dressing or bathing? 0  Doing errands, shopping? 0  Preparing Food and eating ? N  Using the Toilet? N  In the past six months, have you accidently leaked urine? N  Do you have problems with loss of bowel control? N  Managing your Medications? N  Managing your Finances? N  Housekeeping or managing your Housekeeping? N    Patient Care Team: Sonny Masters, FNP as PCP - General (Family Medicine) Wyline Mood Dorothe Pea, MD as PCP - Cardiology (Cardiology) Mealor, Roberts Gaudy, MD as PCP - Electrophysiology (  Cardiology) Genelle Gather, OD (Optometry) Netta Neat., NP (Inactive) as Nurse Practitioner (Cardiology) Randa Lynn, MD as Consulting Physician (Nephrology) Bjorn Pippin, MD as Attending Physician (Urology) Franky Macho, MD as Consulting Physician (General Surgery) Danella Maiers,  [facility] as Pharmacist (Family Medicine)  Indicate any recent Medical Services you may have received from other than [facility] providers in the past year (date may be approximate).     Assessment:   This is a routine wellness examination for O[place].  Hearing/Vision screen Vision Screening - Comments:: Wears rx glasses - up to date with routine eye exams with  [facility]   Dietary issues and exercise activities discussed: Current Exercise Habits: Home exercise routine, Type of exercise: walking, Time (Minutes): 30, Frequency (Times/Week): 7, Weekly Exercise (Minutes/Week): 210, Intensity: Mild, Exercise limited by: None identified   Goals Addressed             This Visit's Progress    Activity and Exercise Increased   On track    Evidence-based guidance:  Review current exercise levels.  Assess patient perspective on exercise or activity level, barriers to increasing activity, motivation and readiness for change.  Recommend or set healthy exercise goal based on individual tolerance.  Encourage small steps toward making change in amount of exercise or activity.  Urge reduction of sedentary activities or screen time.  Promote group activities within the community or with family or support person.  Consider referral to rehabiliation therapist for assessment and exercise/activity plan.   Notes:        Depression Screen    06/22/2022    3:25 PM 04/29/2022    9:09 AM 01/27/2022   10:27 AM 10/09/2021    7:54 AM 07/17/2021    8:11 AM 06/16/2021    3:35 PM 04/15/2021    8:30 AM  PHQ 2/9 Scores  PHQ - 2 Score 0 0 0 0 0 0 0  PHQ- 9 Score 0 0 0  0  0    Fall Risk    06/22/2022    3:24 PM 04/29/2022    9:09 AM 01/27/2022   10:27 AM 10/09/2021    7:54 AM 07/17/2021    8:11 AM  Fall Risk   Falls in the past year? 0 0 0 0 0  Number falls in past yr: 0      Injury with Fall? 0      Risk for fall due to : No Fall Risks      Follow up Falls prevention discussed        FALL RISK PREVENTION  PERTAINING TO THE HOME:  Any stairs in or around the home? No  If so, are there any without handrails? No  Home free of loose throw rugs in walkways, pet beds, electrical cords, etc? Yes  Adequate lighting in your home to reduce risk of falls? Yes   ASSISTIVE DEVICES UTILIZED TO PREVENT FALLS:  Life alert? No  Use of a cane, walker or w/c? No  Grab bars in the bathroom? No  Shower chair or bench in shower? Yes  Elevated toilet seat or a handicapped toilet? No          06/22/2022    3:26 PM 06/16/2021    3:44 PM 06/12/2020    1:19 PM 06/02/2019    9:22 AM  6CIT Screen  What Year? 0 points 0 points 0 points 0 points  What month? 0 points 0 points 0 points 0 points  What time?  0 points 0 points 0 points 0 points  Count back from 20 0 points 0 points 0 points 0 points  Months in reverse 0 points 2 points 0 points 4 points  Repeat phrase 0 points 0 points 2 points 4 points  Total Score 0 points 2 points 2 points 8 points    Immunizations Immunization History  Administered Date(s) Administered   Fluad Quad(high Dose 65+) 10/17/2018, 10/02/2019   Influenza, High Dose Seasonal PF 10/31/2016   Influenza-Unspecified 12/04/2018, 11/06/2020   PNEUMOCOCCAL CONJUGATE-20 11/06/2020   Pneumococcal Conjugate-13 02/14/2015   Pneumococcal Polysaccharide-23 02/02/2006, 08/26/2017   Td 08/26/2017   Td (Adult),5 Lf Tetanus Toxid, Preservative Free 08/26/2017   Tdap 10/13/2011, 08/26/2017, 09/13/2018   Zoster Recombinat (Shingrix) 04/15/2021, 07/17/2021    TDAP status: Up to date  Flu Vaccine status: Up to date  Pneumococcal vaccine status: Up to date  Covid-19 vaccine status: Declined, Education has been provided regarding the importance of this vaccine but patient still declined. Advised may receive this vaccine at local pharmacy or Health Dept.or vaccine clinic. Aware to provide a copy of the vaccination record if obtained from local pharmacy or Health Dept. Verbalized acceptance and  understanding.  Qualifies for Shingles Vaccine? Yes   Zostavax completed Yes   Shingrix Completed?: Yes  Screening Tests Health Maintenance  Topic Date Due   COVID-19 Vaccine (1) Never done   INFLUENZA VACCINE  09/03/2022   Diabetic kidney evaluation - Urine ACR  10/10/2022   FOOT EXAM  10/10/2022   HEMOGLOBIN A1C  10/30/2022   OPHTHALMOLOGY EXAM  11/18/2022   Diabetic kidney evaluation - eGFR measurement  04/29/2023   Medicare Annual Wellness (AWV)  06/22/2023   DTaP/Tdap/Td (6 - Td or Tdap) 09/12/2028   Pneumonia Vaccine 63+ Years old  Completed   Zoster Vaccines- Shingrix  Completed   HPV VACCINES  Aged Out    Health Maintenance  Health Maintenance Due  Topic Date Due   COVID-19 Vaccine (1) Never done    Colorectal cancer screening: No longer required.   Lung Cancer Screening: (Low Dose CT Chest recommended if Age 38-80 years, 30 pack-year currently smoking OR have quit w/in 15years.) does not qualify.   Lung Cancer Screening Referral: n/a  Additional Screening:  Hepatitis C Screening: does not qualify;   Vision Screening: Recommended annual ophthalmology exams for early detection of glaucoma and other disorders of the eye. Is the patient up to date with their annual eye exam?  Yes  Who is the provider or what is the name of the office in which the patient attends annual eye exams? [facility]  If pt is not established with a provider, would they like to be referred to a provider to establish care? No .   Dental Screening: Recommended annual dental exams for proper oral hygiene  Community Resource Referral / Chronic Care Management: CRR required this visit?  No   CCM required this visit?  No      Plan:     I have personally reviewed and noted the following in the patient's chart:   Medical and social history Use of alcohol, tobacco or illicit drugs  Current medications and supplements including opioid prescriptions. Patient is not currently taking  opioid prescriptions. Functional ability and status Nutritional status Physical activity Advanced directives List of other physicians Hospitalizations, surgeries, and ER visits in previous 12 months Vitals Screenings to include cognitive, depression, and falls Referrals and appointments  In addition, I have reviewed and discussed with  patient certain preventive protocols, quality metrics, and best practice recommendations. A written personalized care plan for preventive services as well as general preventive health recommendations were provided to patient.     Lorrene Reid, LPN   [id number]/61/0960   Nurse Notes: none

## 2022-07-06 ENCOUNTER — Ambulatory Visit: Payer: Medicare Other | Attending: Internal Medicine

## 2022-07-06 DIAGNOSIS — I5022 Chronic systolic (congestive) heart failure: Secondary | ICD-10-CM | POA: Diagnosis not present

## 2022-07-06 DIAGNOSIS — Z95810 Presence of automatic (implantable) cardiac defibrillator: Secondary | ICD-10-CM

## 2022-07-08 ENCOUNTER — Ambulatory Visit (INDEPENDENT_AMBULATORY_CARE_PROVIDER_SITE_OTHER): Payer: Medicare Other

## 2022-07-08 DIAGNOSIS — I255 Ischemic cardiomyopathy: Secondary | ICD-10-CM | POA: Diagnosis not present

## 2022-07-08 LAB — CUP PACEART REMOTE DEVICE CHECK
Battery Remaining Longevity: 23 mo
Battery Voltage: 2.93 V
Brady Statistic AP VP Percent: 44.23 %
Brady Statistic AP VS Percent: 0.03 %
Brady Statistic AS VP Percent: 54.78 %
Brady Statistic AS VS Percent: 0.96 %
Brady Statistic RA Percent Paced: 44.23 %
Brady Statistic RV Percent Paced: 95.17 %
Date Time Interrogation Session: 20240605033524
HighPow Impedance: 82 Ohm
Implantable Lead Connection Status: 753985
Implantable Lead Connection Status: 753985
Implantable Lead Connection Status: 753985
Implantable Lead Implant Date: 20181130
Implantable Lead Implant Date: 20181130
Implantable Lead Implant Date: 20181130
Implantable Lead Location: 753858
Implantable Lead Location: 753859
Implantable Lead Location: 753860
Implantable Lead Model: 4598
Implantable Lead Model: 5076
Implantable Lead Model: 6935
Implantable Pulse Generator Implant Date: 20181130
Lead Channel Impedance Value: 156.606
Lead Channel Impedance Value: 156.606
Lead Channel Impedance Value: 160.941
Lead Channel Impedance Value: 166.114
Lead Channel Impedance Value: 166.114
Lead Channel Impedance Value: 304 Ohm
Lead Channel Impedance Value: 323 Ohm
Lead Channel Impedance Value: 323 Ohm
Lead Channel Impedance Value: 342 Ohm
Lead Channel Impedance Value: 342 Ohm
Lead Channel Impedance Value: 399 Ohm
Lead Channel Impedance Value: 494 Ohm
Lead Channel Impedance Value: 532 Ohm
Lead Channel Impedance Value: 570 Ohm
Lead Channel Impedance Value: 570 Ohm
Lead Channel Impedance Value: 589 Ohm
Lead Channel Impedance Value: 627 Ohm
Lead Channel Impedance Value: 627 Ohm
Lead Channel Pacing Threshold Amplitude: 0.375 V
Lead Channel Pacing Threshold Amplitude: 0.75 V
Lead Channel Pacing Threshold Amplitude: 1.125 V
Lead Channel Pacing Threshold Pulse Width: 0.4 ms
Lead Channel Pacing Threshold Pulse Width: 0.4 ms
Lead Channel Pacing Threshold Pulse Width: 0.4 ms
Lead Channel Sensing Intrinsic Amplitude: 2.625 mV
Lead Channel Sensing Intrinsic Amplitude: 2.625 mV
Lead Channel Sensing Intrinsic Amplitude: 8.875 mV
Lead Channel Sensing Intrinsic Amplitude: 8.875 mV
Lead Channel Setting Pacing Amplitude: 1.75 V
Lead Channel Setting Pacing Amplitude: 2 V
Lead Channel Setting Pacing Amplitude: 2.5 V
Lead Channel Setting Pacing Pulse Width: 0.4 ms
Lead Channel Setting Pacing Pulse Width: 0.4 ms
Lead Channel Setting Sensing Sensitivity: 0.3 mV
Zone Setting Status: 755011
Zone Setting Status: 755011

## 2022-07-08 NOTE — Progress Notes (Signed)
EPIC Encounter for ICM Monitoring  Patient Name: Omar Williams is a 87 y.o. male Date: 07/08/2022 Primary Care Physican: Sonny Masters, FNP Primary Cardiologist:  Wyline Mood Electrophysiologist: Mealor Nephrologist: [facility] Bi-V Pacing: 98.8% 10/22/2021 Weight: 161-162 lbs 04/27/2022 Weight: 160 lbs 06/05/2022 Office Weight: 165 lbs   Since 05-Jun-2022 Time in AT/AF  0.0 hr/day (0.0%)   Spoke with patient and heart failure questions reviewed.  Transmission results reviewed.  Pt asymptomatic for fluid accumulation.    Optivol Thoracic impedance suggesting normal fluid levels with the exception of possible fluid accumulation from 5/13-5/19.   Prescribed:  Furosemide 20 mg take 1 tablet as needed for swelling.     Labs: 05/25/2022 Creatinine 1.53, BUN 27, Potassium 4.8, Sodium 141, GFR 44 04/29/2022 Creatinine 1.50, BUN 27, Potassium 3.8, Sodium 143, GFR 46 A complete set of results can be found in Results Review.   Recommendations:   No changes and encouraged to call if experiencing any fluid symptoms.   Follow-up plan: ICM clinic phone appointment on 08/10/2022.   91 day device clinic remote transmission 10/07/2022.       EP/Cardiology Office Visits:  06/04/2023 with Dr Nelly Laurence.  12/02/2022 with Dr Wyline Mood.        Copy of ICM check sent to Dr. Nelly Laurence.     3 month ICM trend: 07/08/2022.    12-14 Month ICM trend:     Karie Soda, RN 07/08/2022 12:34 PM

## 2022-07-09 ENCOUNTER — Ambulatory Visit (INDEPENDENT_AMBULATORY_CARE_PROVIDER_SITE_OTHER): Payer: Medicare Other | Admitting: Family Medicine

## 2022-07-09 ENCOUNTER — Encounter: Payer: Self-pay | Admitting: Family Medicine

## 2022-07-09 VITALS — BP 114/66 | HR 59 | Temp 97.2°F | Ht 67.0 in | Wt 164.4 lb

## 2022-07-09 DIAGNOSIS — N3001 Acute cystitis with hematuria: Secondary | ICD-10-CM

## 2022-07-09 DIAGNOSIS — R300 Dysuria: Secondary | ICD-10-CM | POA: Diagnosis not present

## 2022-07-09 LAB — URINALYSIS, ROUTINE W REFLEX MICROSCOPIC
Bilirubin, UA: NEGATIVE
Ketones, UA: NEGATIVE
Nitrite, UA: NEGATIVE
Specific Gravity, UA: 1.015 (ref 1.005–1.030)
Urobilinogen, Ur: 0.2 mg/dL (ref 0.2–1.0)
pH, UA: 6 (ref 5.0–7.5)

## 2022-07-09 LAB — MICROSCOPIC EXAMINATION: Renal Epithel, UA: NONE SEEN /hpf

## 2022-07-09 MED ORDER — SULFAMETHOXAZOLE-TRIMETHOPRIM 800-160 MG PO TABS
1.0000 | ORAL_TABLET | Freq: Two times a day (BID) | ORAL | 0 refills | Status: AC
Start: 2022-07-09 — End: 2022-07-16

## 2022-07-09 NOTE — Progress Notes (Signed)
Subjective:  Patient ID: Omar Williams, male    DOB: 04-Jan-1936, 87 y.o.   MRN: [id number]  Patient Care Team: Sonny Masters, FNP as PCP - General (Family Medicine) Wyline Mood, Dorothe Pea, MD as PCP - Cardiology (Cardiology) Mealor, Roberts Gaudy, MD as PCP - Electrophysiology (Cardiology) Genelle Gather, OD (Optometry) Netta Neat., NP (Inactive) as Nurse Practitioner (Cardiology) Randa Lynn, MD as Consulting Physician (Nephrology) Bjorn Pippin, MD as Attending Physician (Urology) Franky Macho, MD as Consulting Physician (General Surgery) Danella Maiers, [facility] as Pharmacist (Family Medicine)   Chief Complaint:  Dysuria (Dysuria only on the morning x 3-4 days )   HPI: Omar Williams is a 87 y.o. male presenting on 07/09/2022 for Dysuria (Dysuria only on the morning x 3-4 days )   Dysuria  This is a new problem. Episode onset: 3 days ago. The problem has been gradually worsening. The quality of the pain is described as burning and aching. The pain is mild. There has been no fever. He is Not sexually active. There is No history of pyelonephritis. Associated symptoms include frequency and urgency. Pertinent negatives include no chills, discharge, flank pain, hematuria, hesitancy, nausea, possible pregnancy, sweats or vomiting. He has tried increased fluids for the symptoms. The treatment provided mild relief.    Relevant past medical, surgical, family, and social history reviewed and updated as indicated.  Allergies and medications reviewed and updated. Data reviewed: Chart in Epic.   Past Medical History:  Diagnosis Date   Acid reflux    Blood transfusion without reported diagnosis    Cardiac arrest (HCC)    a. occuring in 12/2016. LAD disease but no targets for CABG or PCI. Underwent ICD placement as EF 15-20%.    CHF (congestive heart failure) (HCC)    Coronary artery disease    Diabetes mellitus without complication (HCC)    Diabetic nephropathy (HCC)    Hyperlipidemia     Hypertension    Myocardial infarction [facility])    Renal disorder     Past Surgical History:  Procedure Laterality Date   BIV ICD INSERTION CRT-D N/A 01/01/2017   Procedure: BIV ICD INSERTION CRT-D;  Surgeon: Regan Lemming, MD;  Location: [facility] INVASIVE CV LAB;  Service: Cardiovascular;  Laterality: N/A;    Social History   Socioeconomic History   Marital status: Married    Spouse name: johnna   Number of children: 3   Years of education: 12   Highest education level: Some college, no degree  Occupational History   Occupation: Health and safety inspector   retired   Occupation: Education officer, environmental  Tobacco Use   Smoking status: Former    Types: Cigarettes    Quit date: 1960    Years since quitting: 64.4    Passive exposure: Never   Smokeless tobacco: Never   Tobacco comments:    2-3 years as a teenager  Building services engineer Use: Never used  Substance and Sexual Activity   Alcohol use: No   Drug use: No   Sexual activity: Not Currently  Other Topics Concern   Not on file  Social History Narrative   Lives with with wife [name]   Married 60 years   Daughter [name] stays frequently   Granddaughter [name] near by   Social Determinants of Health   Financial Resource Strain: Low Risk  (06/22/2022)   Overall Financial Resource Strain (CARDIA)    Difficulty of Paying Living Expenses: Not hard at all  Food Insecurity: No Food Insecurity (06/22/2022)   Hunger Vital Sign    Worried About Running Out of Food in the Last Year: Never true    Ran Out of Food in the Last Year: Never true  Transportation Needs: No Transportation Needs (06/22/2022)   PRAPARE - Administrator, Civil Service (Medical): No    Lack of Transportation (Non-Medical): No  Physical Activity: Sufficiently Active (06/22/2022)   Exercise Vital Sign    Days of Exercise per Week: 7 days    Minutes of Exercise per Session: 30 min  Stress: No Stress Concern Present (06/22/2022)   [employer] of Occupational  Health - Occupational Stress Questionnaire    Feeling of Stress : Not at all  Social Connections: Socially Integrated (06/22/2022)   Social Connection and Isolation Panel [NHANES]    Frequency of Communication with Friends and Family: More than three times a week    Frequency of Social Gatherings with Friends and Family: More than three times a week    Attends Religious Services: More than 4 times per year    Active Member of [employer] or Organizations: Yes    Attends Engineer, structural: More than 4 times per year    Marital Status: Married  Catering manager Violence: Not At Risk (06/22/2022)   Humiliation, Afraid, Rape, and Kick questionnaire    Fear of Current or Ex-Partner: No    Emotionally Abused: No    Physically Abused: No    Sexually Abused: No    Outpatient Encounter Medications as of 07/09/2022  Medication Sig   aspirin 81 MG chewable tablet Chew 81 mg by mouth daily.   Bee Pollen 550 MG CAPS Take 2 capsules by mouth 2 (two) times daily.   chlorhexidine (PERIDEX) 0.12 % solution as needed.   Cholecalciferol (VITAMIN D3) 125 MCG (5000 UT) TABS Take 1 tablet by mouth daily.    CINNAMON PO Take 2,000 mg by mouth in the morning and at bedtime.   cyanocobalamin (VITAMIN B12) 1000 MCG tablet Take 1,000 mcg by mouth 2 (two) times daily.   dapagliflozin propanediol (FARXIGA) 10 MG TABS tablet Take 1 tablet (10 mg total) by mouth daily before breakfast.   docusate sodium (COLACE) 50 MG capsule Take 100 mg by mouth 2 (two) times daily.   ezetimibe (ZETIA) 10 MG tablet TAKE 1 TABLET BY MOUTH EVERY DAY   finasteride (PROSCAR) 5 MG tablet TAKE 1 TABLET BY MOUTH EVERYDAY AT BEDTIME   furosemide (LASIX) 20 MG tablet Take 1 tablet (20 mg total) by mouth daily as needed for edema.   glimepiride (AMARYL) 4 MG tablet Take 1 tablet (4 mg total) by mouth in the morning and at bedtime.   glucose blood (ONETOUCH ULTRA) test strip Test BS daily Dx E11.9   lisinopril (ZESTRIL) 2.5 MG tablet Take  1 tablet (2.5 mg total) by mouth daily.   nitroGLYCERIN (NITROSTAT) 0.4 MG SL tablet Place 1 tablet (0.4 mg total) under the tongue every 5 (five) minutes x 3 doses as needed for chest pain.   Omega-3 Fatty Acids (FISH OIL) 1200 MG CAPS Take 1 capsule by mouth 2 (two) times daily.    oxybutynin (DITROPAN) 5 MG tablet Take 0.5 tablets (2.5 mg total) by mouth 3 (three) times daily.   pantoprazole (PROTONIX) 40 MG tablet Take 1 tablet (40 mg total) by mouth daily.   rosuvastatin (CRESTOR) 40 MG tablet TAKE 1 TABLET BY MOUTH DAILY   sulfamethoxazole-trimethoprim (BACTRIM DS) 800-160 MG tablet  Take 1 tablet by mouth 2 (two) times daily for 7 days.   tamsulosin (FLOMAX) 0.4 MG CAPS capsule TAKE 1 CAPSULE (0.4 MG TOTAL) BY MOUTH EVERY MORNING.   TURMERIC PO Take 1,000 mg by mouth 2 (two) times daily.   vitamin C (ASCORBIC ACID) 500 MG tablet Take 1,000 mg by mouth 2 (two) times daily.   No facility-administered encounter medications on file as of 07/09/2022.    No Known Allergies  Review of Systems  Constitutional:  Negative for activity change, appetite change, chills, fatigue and fever.  HENT: Negative.    Eyes: Negative.   Respiratory:  Negative for cough, chest tightness and shortness of breath.   Cardiovascular:  Negative for chest pain, palpitations and leg swelling.  Gastrointestinal:  Negative for blood in stool, constipation, diarrhea, nausea and vomiting.  Endocrine: Negative.   Genitourinary:  Positive for dysuria, frequency and urgency. Negative for decreased urine volume, difficulty urinating, enuresis, flank pain, genital sores, hematuria, hesitancy, penile discharge, penile pain, penile swelling, scrotal swelling and testicular pain.  Musculoskeletal:  Negative for arthralgias and myalgias.  Skin: Negative.   Allergic/Immunologic: Negative.   Neurological:  Negative for dizziness, weakness and headaches.  Hematological: Negative.   Psychiatric/Behavioral:  Negative for confusion,  hallucinations, sleep disturbance and suicidal ideas.   All other systems reviewed and are negative.       Objective:  BP 114/66   Pulse (!) 59   Temp (!) 97.2 F (36.2 C) (Temporal)   Ht 5\' 7"  (1.702 m)   Wt 164 lb 6.4 oz (74.6 kg)   SpO2 94%   BMI 25.75 kg/m    Wt Readings from Last 3 Encounters:  07/09/22 164 lb 6.4 oz (74.6 kg)  06/22/22 162 lb (73.5 kg)  06/05/22 165 lb (74.8 kg)    Physical Exam Vitals and nursing note reviewed.  Constitutional:      Appearance: Normal appearance.  HENT:     Head: Normocephalic and atraumatic.     Mouth/Throat:     Mouth: Mucous membranes are moist.  Eyes:     Conjunctiva/sclera: Conjunctivae normal.     Pupils: Pupils are equal, round, and reactive to light.  Cardiovascular:     Rate and Rhythm: Normal rate and regular rhythm.     Heart sounds: Normal heart sounds.  Pulmonary:     Effort: Pulmonary effort is normal.     Breath sounds: Normal breath sounds.  Abdominal:     Tenderness: There is no abdominal tenderness. There is no right CVA tenderness or left CVA tenderness.  Skin:    General: Skin is warm and dry.     Capillary Refill: Capillary refill takes less than 2 seconds.  Neurological:     General: No focal deficit present.     Mental Status: He is alert and oriented to person, place, and time.  Psychiatric:        Mood and Affect: Mood normal.        Thought Content: Thought content normal.        Judgment: Judgment normal.     Results for orders placed or performed in visit on 07/08/22  CUP PACEART REMOTE DEVICE CHECK  Result Value Ref Range   Date Time Interrogation Session [id number]    Pulse Generator Manufacturer MERM    Pulse Gen Model [id number] Claria MRI Quad CRT-D    Pulse Gen Serial Number [id number]    Clinic Name [facility]    Implantable Pulse Generator Type Cardiac Resynch  Therapy Defibulator    Implantable Pulse Generator Implant Date 16109604    Implantable Lead Manufacturer MERM     Implantable Lead Model 4598 Attain Performa S MRI SureScan    Implantable Lead Serial Number [id number]    Implantable Lead Implant Date 54098119    Implantable Lead Location Detail 1 UNKNOWN    Implantable Lead Location [id number]    Implantable Lead Connection Status [id number]    Implantable Lead Manufacturer MERM    Implantable Lead Model 5076 CapSureFix Novus MRI SureScan    Implantable Lead Serial Number [id number]    Implantable Lead Implant Date 30865784    Implantable Lead Location Detail 1 UNKNOWN    Implantable Lead Location [id number]    Implantable Lead Connection Status [id number]    Implantable Lead Manufacturer MERM    Implantable Lead Model 6935 Sprint Quattro Secure S MRI SureScan    Implantable Lead Serial Number [id number]    Implantable Lead Implant Date 69629528    Implantable Lead Location Detail 1 UNKNOWN    Implantable Lead Location [id number]    Implantable Lead Connection Status [id number]    Lead Channel Setting Sensing Sensitivity 0.3 mV   Lead Channel Setting Pacing Amplitude 2 V   Lead Channel Setting Pacing Pulse Width 0.4 ms   Lead Channel Setting Pacing Amplitude 2.5 V   Lead Channel Setting Pacing Pulse Width 0.4 ms   Lead Channel Setting Pacing Amplitude 1.75 V   Lead Channel Setting Pacing Capture Mode Adaptive Capture    Zone Setting Status Active    Zone Setting Status Inactive    Zone Setting Status Inactive    Zone Setting Status 755011    Zone Setting Status [phone]    Lead Channel Impedance Value 342 ohm   Lead Channel Sensing Intrinsic Amplitude 2.625 mV   Lead Channel Sensing Intrinsic Amplitude 2.625 mV   Lead Channel Pacing Threshold Amplitude 0.75 V   Lead Channel Pacing Threshold Pulse Width 0.4 ms   Lead Channel Impedance Value 627 ohm   Lead Channel Impedance Value 570 ohm   Lead Channel Sensing Intrinsic Amplitude 8.875 mV   Lead Channel Sensing Intrinsic Amplitude 8.875 mV   Lead Channel Pacing Threshold Amplitude 0.375 V   Lead Channel Pacing  Threshold Pulse Width 0.4 ms   HighPow Impedance 82 ohm   Lead Channel Impedance Value 627 ohm   Lead Channel Impedance Value 589 ohm   Lead Channel Impedance Value 570 ohm   Lead Channel Impedance Value 399 ohm   Lead Channel Impedance Value 532 ohm   Lead Channel Impedance Value 494 ohm   Lead Channel Impedance Value 342 ohm   Lead Channel Impedance Value 323 ohm   Lead Channel Impedance Value 323 ohm   Lead Channel Impedance Value 304 ohm   Lead Channel Impedance Value 166.114    Lead Channel Impedance Value 166.114    Lead Channel Impedance Value 160.941    Lead Channel Impedance Value 156.606    Lead Channel Impedance Value 156.606    Lead Channel Pacing Threshold Amplitude 1.125 V   Lead Channel Pacing Threshold Pulse Width 0.4 ms   Battery Status OK    Battery Remaining Longevity 23 mo   Battery Voltage 2.93 V   Brady Statistic RA Percent Paced 44.23 %   Brady Statistic RV Percent Paced 95.17 %   Brady Statistic AP VP Percent 44.23 %   Brady Statistic AS VP Percent 54.78 %   Brady Statistic AP VS Percent 0.03 %  Brady Statistic AS VS Percent 0.96 %       Pertinent labs & imaging results that were available during my care of the patient were reviewed by me and considered in my medical decision making.  Assessment & Plan:  [place] was seen today for dysuria.  Diagnoses and all orders for this visit:  Dysuria Urinalysis with 3+ glucose, trace blood, and trace leukocytes.  -     Urinalysis, Routine w reflex microscopic -     Urine Culture  Acute cystitis with hematuria Previous cultures reviewed and antibiotic selection based off of results. Culture pending, will change therapy if warranted.  -     sulfamethoxazole-trimethoprim (BACTRIM DS) 800-160 MG tablet; Take 1 tablet by mouth 2 (two) times daily for 7 days.     Continue all other maintenance medications.  Follow up plan: Return if symptoms worsen or fail to improve.   Continue healthy lifestyle choices,  including diet (rich in fruits, vegetables, and lean proteins, and low in salt and simple carbohydrates) and exercise (at least 30 minutes of moderate physical activity daily).  Educational handout given for UTI  The above assessment and management plan was discussed with the patient. The patient verbalized understanding of and has agreed to the management plan. Patient is aware to call the clinic if they develop any new symptoms or if symptoms persist or worsen. Patient is aware when to return to the clinic for a follow-up visit. Patient educated on when it is appropriate to go to the emergency department.   Kari Baars, FNP-C [facility] [phone]

## 2022-07-15 LAB — URINE CULTURE

## 2022-07-21 ENCOUNTER — Ambulatory Visit (INDEPENDENT_AMBULATORY_CARE_PROVIDER_SITE_OTHER): Payer: Medicare Other | Admitting: Family Medicine

## 2022-07-21 ENCOUNTER — Encounter: Payer: Self-pay | Admitting: Family Medicine

## 2022-07-21 VITALS — BP 119/68 | HR 58 | Temp 97.9°F | Ht 67.0 in | Wt 162.4 lb

## 2022-07-21 DIAGNOSIS — E1169 Type 2 diabetes mellitus with other specified complication: Secondary | ICD-10-CM | POA: Diagnosis not present

## 2022-07-21 DIAGNOSIS — K219 Gastro-esophageal reflux disease without esophagitis: Secondary | ICD-10-CM | POA: Diagnosis not present

## 2022-07-21 DIAGNOSIS — I5022 Chronic systolic (congestive) heart failure: Secondary | ICD-10-CM | POA: Diagnosis not present

## 2022-07-21 DIAGNOSIS — Z7984 Long term (current) use of oral hypoglycemic drugs: Secondary | ICD-10-CM

## 2022-07-21 DIAGNOSIS — E1159 Type 2 diabetes mellitus with other circulatory complications: Secondary | ICD-10-CM

## 2022-07-21 DIAGNOSIS — I152 Hypertension secondary to endocrine disorders: Secondary | ICD-10-CM

## 2022-07-21 LAB — CBC WITH DIFFERENTIAL/PLATELET
Basophils Absolute: 0.1 10*3/uL (ref 0.0–0.2)
Basos: 1 %
EOS (ABSOLUTE): 0.5 10*3/uL — ABNORMAL HIGH (ref 0.0–0.4)
Eos: 6 %
Hematocrit: 46.6 % (ref 37.5–51.0)
Hemoglobin: 15.6 g/dL (ref 13.0–17.7)
Immature Grans (Abs): 0 10*3/uL (ref 0.0–0.1)
Immature Granulocytes: 0 %
Lymphocytes Absolute: 2.1 10*3/uL (ref 0.7–3.1)
Lymphs: 26 %
MCH: 31.5 pg (ref 26.6–33.0)
MCHC: 33.5 g/dL (ref 31.5–35.7)
MCV: 94 fL (ref 79–97)
Monocytes Absolute: 0.9 10*3/uL (ref 0.1–0.9)
Monocytes: 10 %
Neutrophils Absolute: 4.8 10*3/uL (ref 1.4–7.0)
Neutrophils: 57 %
Platelets: 224 10*3/uL (ref 150–450)
RBC: 4.95 x10E6/uL (ref 4.14–5.80)
RDW: 13.3 % (ref 11.6–15.4)
WBC: 8.3 10*3/uL (ref 3.4–10.8)

## 2022-07-21 LAB — CMP14+EGFR
ALT: 25 IU/L (ref 0–44)
AST: 26 IU/L (ref 0–40)
Albumin: 4.3 g/dL (ref 3.7–4.7)
Alkaline Phosphatase: 86 IU/L (ref 44–121)
BUN/Creatinine Ratio: 17 (ref 10–24)
BUN: 25 mg/dL (ref 8–27)
Bilirubin Total: 0.8 mg/dL (ref 0.0–1.2)
CO2: 24 mmol/L (ref 20–29)
Calcium: 9.3 mg/dL (ref 8.6–10.2)
Chloride: 102 mmol/L (ref 96–106)
Creatinine, Ser: 1.44 mg/dL — ABNORMAL HIGH (ref 0.76–1.27)
Globulin, Total: 2.5 g/dL (ref 1.5–4.5)
Glucose: 94 mg/dL (ref 70–99)
Potassium: 5.3 mmol/L — ABNORMAL HIGH (ref 3.5–5.2)
Sodium: 140 mmol/L (ref 134–144)
Total Protein: 6.8 g/dL (ref 6.0–8.5)
eGFR: 48 mL/min/{1.73_m2} — ABNORMAL LOW (ref >59)

## 2022-07-21 LAB — BAYER DCA HB A1C WAIVED: HB A1C (BAYER DCA - WAIVED): 7.2 % — ABNORMAL HIGH (ref 4.8–5.6)

## 2022-07-21 MED ORDER — FUROSEMIDE 20 MG PO TABS
20.0000 mg | ORAL_TABLET | Freq: Every day | ORAL | 1 refills | Status: AC | PRN
Start: 2022-07-21 — End: ?

## 2022-07-21 MED ORDER — PANTOPRAZOLE SODIUM 40 MG PO TBEC
40.0000 mg | DELAYED_RELEASE_TABLET | Freq: Every day | ORAL | 1 refills | Status: DC
Start: 2022-07-21 — End: 2022-12-02

## 2022-07-21 NOTE — Patient Instructions (Addendum)

## 2022-07-21 NOTE — Progress Notes (Signed)
Subjective:  Patient ID: Omar Williams, male    DOB: [birth year], 87 y.o.   MRN: [id number]  Patient Care Team: Sonny Masters, FNP as PCP - General (Family Medicine) Wyline Mood, Dorothe Pea, MD as PCP - Cardiology (Cardiology) Mealor, Roberts Gaudy, MD as PCP - Electrophysiology (Cardiology) Genelle Gather, OD (Optometry) Netta Neat., NP (Inactive) as Nurse Practitioner (Cardiology) Randa Lynn, MD as Consulting Physician (Nephrology) Bjorn Pippin, MD as Attending Physician (Urology) Franky Macho, MD as Consulting Physician (General Surgery) Danella Maiers, [facility] as Pharmacist (Family Medicine)   Chief Complaint:  Diabetes (3 month follow up )   HPI: Omar Williams is a 87 y.o. male presenting on 07/21/2022 for Diabetes (3 month follow up )   1. Type 2 diabetes mellitus with other specified complication, without long-term current use of insulin (HCC) Compliant with medications without associated side effects. States blood sugar was 106 this morning. Lowest has been 81. Denies polyuria, polyphagia, or polydipsia.   2. Hypertension associated with type 2 diabetes mellitus (HCC) Complaint with meds - Yes Current Medications - lisinopril, furosemide  Checking BP at home - no Exercising Regularly - No Watching Salt intake - Yes Pertinent ROS:  Headache - No Fatigue - No Visual Disturbances - No Chest pain - No Dyspnea - No Palpitations - No LE edema - No They report good compliance with medications and can restate their regimen by memory. No medication side effects.  BP Readings from Last 3 Encounters:  07/21/22 119/68  07/09/22 114/66  06/05/22 106/62     3. Chronic systolic heart failure (HCC) Followed by cardiology on a regular basis. Denies orthopnea, PND, leg swelling, shortness of breath, or fatigue.   4. Gastro-esophageal reflux disease without esophagitis States he only takes his PPI therapy as needed when he eats spicy or greasy foods. States he may take it  once or twice a month.      Relevant past medical, surgical, family, and social history reviewed and updated as indicated.  Allergies and medications reviewed and updated. Data reviewed: Chart in Epic.   Past Medical History:  Diagnosis Date   Acid reflux    Blood transfusion without reported diagnosis    Cardiac arrest (HCC)    a. occuring in 12/2016. LAD disease but no targets for CABG or PCI. Underwent ICD placement as EF 15-20%.    CHF (congestive heart failure) (HCC)    Coronary artery disease    Diabetes mellitus without complication (HCC)    Diabetic nephropathy (HCC)    Hyperlipidemia    Hypertension    Myocardial infarction [facility])    Renal disorder     Past Surgical History:  Procedure Laterality Date   BIV ICD INSERTION CRT-D N/A 01/01/2017   Procedure: BIV ICD INSERTION CRT-D;  Surgeon: Regan Lemming, MD;  Location: [facility] INVASIVE CV LAB;  Service: Cardiovascular;  Laterality: N/A;    Social History   Socioeconomic History   Marital status: Married    Spouse name: johnna   Number of children: 3   Years of education: 12   Highest education level: Some college, no degree  Occupational History   Occupation: Health and safety inspector   retired   Occupation: Education officer, environmental  Tobacco Use   Smoking status: Former    Types: Cigarettes    Quit date: 1960    Years since quitting: 64.5    Passive exposure: Never   Smokeless tobacco: Never   Tobacco comments:    2-3  years as a teenager  Vaping Use   Vaping Use: Never used  Substance and Sexual Activity   Alcohol use: No   Drug use: No   Sexual activity: Not Currently  Other Topics Concern   Not on file  Social History Narrative   Lives with with wife [name]   Married 60 years   Daughter [name] stays frequently   Granddaughter [name] near by   Social Determinants of Health   Financial Resource Strain: Low Risk  (06/22/2022)   Overall Financial Resource Strain (CARDIA)    Difficulty of Paying Living Expenses: Not  hard at all  Food Insecurity: No Food Insecurity (06/22/2022)   Hunger Vital Sign    Worried About Running Out of Food in the Last Year: Never true    Ran Out of Food in the Last Year: Never true  Transportation Needs: No Transportation Needs (06/22/2022)   PRAPARE - Administrator, Civil Service (Medical): No    Lack of Transportation (Non-Medical): No  Physical Activity: Sufficiently Active (06/22/2022)   Exercise Vital Sign    Days of Exercise per Week: 7 days    Minutes of Exercise per Session: 30 min  Stress: No Stress Concern Present (06/22/2022)   [employer] of Occupational Health - Occupational Stress Questionnaire    Feeling of Stress : Not at all  Social Connections: Socially Integrated (06/22/2022)   Social Connection and Isolation Panel [NHANES]    Frequency of Communication with Friends and Family: More than three times a week    Frequency of Social Gatherings with Friends and Family: More than three times a week    Attends Religious Services: More than 4 times per year    Active Member of [employer] or Organizations: Yes    Attends Engineer, structural: More than 4 times per year    Marital Status: Married  Catering manager Violence: Not At Risk (06/22/2022)   Humiliation, Afraid, Rape, and Kick questionnaire    Fear of Current or Ex-Partner: No    Emotionally Abused: No    Physically Abused: No    Sexually Abused: No    Outpatient Encounter Medications as of 07/21/2022  Medication Sig   aspirin 81 MG chewable tablet Chew 81 mg by mouth daily.   Bee Pollen 550 MG CAPS Take 2 capsules by mouth 2 (two) times daily.   chlorhexidine (PERIDEX) 0.12 % solution as needed.   Cholecalciferol (VITAMIN D3) 125 MCG (5000 UT) TABS Take 1 tablet by mouth daily.    CINNAMON PO Take 2,000 mg by mouth in the morning and at bedtime.   cyanocobalamin (VITAMIN B12) 1000 MCG tablet Take 1,000 mcg by mouth 2 (two) times daily.   dapagliflozin propanediol (FARXIGA) 10 MG  TABS tablet Take 1 tablet (10 mg total) by mouth daily before breakfast.   docusate sodium (COLACE) 50 MG capsule Take 100 mg by mouth 2 (two) times daily.   ezetimibe (ZETIA) 10 MG tablet TAKE 1 TABLET BY MOUTH EVERY DAY   finasteride (PROSCAR) 5 MG tablet TAKE 1 TABLET BY MOUTH EVERYDAY AT BEDTIME   furosemide (LASIX) 20 MG tablet Take 1 tablet (20 mg total) by mouth daily as needed for edema.   glimepiride (AMARYL) 4 MG tablet Take 1 tablet (4 mg total) by mouth in the morning and at bedtime.   glucose blood (ONETOUCH ULTRA) test strip Test BS daily Dx E11.9   lisinopril (ZESTRIL) 2.5 MG tablet Take 1 tablet (2.5 mg total) by  mouth daily.   nitroGLYCERIN (NITROSTAT) 0.4 MG SL tablet Place 1 tablet (0.4 mg total) under the tongue every 5 (five) minutes x 3 doses as needed for chest pain.   Omega-3 Fatty Acids (FISH OIL) 1200 MG CAPS Take 1 capsule by mouth 2 (two) times daily.    oxybutynin (DITROPAN) 5 MG tablet Take 0.5 tablets (2.5 mg total) by mouth 3 (three) times daily.   pantoprazole (PROTONIX) 40 MG tablet Take 1 tablet (40 mg total) by mouth daily.   rosuvastatin (CRESTOR) 40 MG tablet TAKE 1 TABLET BY MOUTH DAILY   tamsulosin (FLOMAX) 0.4 MG CAPS capsule TAKE 1 CAPSULE (0.4 MG TOTAL) BY MOUTH EVERY MORNING.   TURMERIC PO Take 1,000 mg by mouth 2 (two) times daily.   vitamin C (ASCORBIC ACID) 500 MG tablet Take 1,000 mg by mouth 2 (two) times daily.   [DISCONTINUED] furosemide (LASIX) 20 MG tablet Take 1 tablet (20 mg total) by mouth daily as needed for edema.   [DISCONTINUED] pantoprazole (PROTONIX) 40 MG tablet Take 1 tablet (40 mg total) by mouth daily.   No facility-administered encounter medications on file as of 07/21/2022.    No Known Allergies  Review of Systems  Constitutional:  Negative for activity change, appetite change, chills, fatigue and fever.  HENT: Negative.    Eyes: Negative.   Respiratory:  Negative for cough, chest tightness and shortness of breath.    Cardiovascular:  Negative for chest pain, palpitations and leg swelling.  Gastrointestinal:  Negative for blood in stool, constipation, diarrhea, nausea and vomiting.  Endocrine: Negative.   Genitourinary:  Negative for dysuria, frequency and urgency.  Musculoskeletal:  Negative for arthralgias and myalgias.  Skin: Negative.   Allergic/Immunologic: Negative.   Neurological:  Negative for dizziness and headaches.  Hematological: Negative.   Psychiatric/Behavioral:  Negative for confusion, hallucinations, sleep disturbance and suicidal ideas.   All other systems reviewed and are negative.       Objective:  BP 119/68   Pulse (!) 58   Temp 97.9 F (36.6 C) (Temporal)   Ht 5\' 7"  (1.702 m)   Wt 162 lb 6.4 oz (73.7 kg)   SpO2 98%   BMI 25.44 kg/m    Wt Readings from Last 3 Encounters:  07/21/22 162 lb 6.4 oz (73.7 kg)  07/09/22 164 lb 6.4 oz (74.6 kg)  06/22/22 162 lb (73.5 kg)    Physical Exam Vitals and nursing note reviewed.  Constitutional:      General: He is not in acute distress.    Appearance: Normal appearance. He is well-developed and well-groomed. He is not ill-appearing, toxic-appearing or diaphoretic.  HENT:     Head: Normocephalic and atraumatic.     Jaw: There is normal jaw occlusion.     Right Ear: Hearing normal.     Left Ear: Hearing normal.     Nose: Nose normal.     Mouth/Throat:     Lips: Pink.     Mouth: Mucous membranes are moist.     Pharynx: Oropharynx is clear. Uvula midline.  Eyes:     General: Lids are normal.     Extraocular Movements: Extraocular movements intact.     Conjunctiva/sclera: Conjunctivae normal.     Pupils: Pupils are equal, round, and reactive to light.  Neck:     Thyroid: No thyroid mass, thyromegaly or thyroid tenderness.     Vascular: No carotid bruit or JVD.     Trachea: Trachea and phonation normal.  Cardiovascular:  Rate and Rhythm: Normal rate and regular rhythm.     Chest Wall: PMI is not displaced.      Pulses: Normal pulses.     Heart sounds: Normal heart sounds. No murmur heard.    No friction rub. No gallop.  Pulmonary:     Effort: Pulmonary effort is normal. No respiratory distress.     Breath sounds: Normal breath sounds. No wheezing.  Abdominal:     General: Bowel sounds are normal. There is no distension or abdominal bruit.     Palpations: Abdomen is soft. There is no hepatomegaly or splenomegaly.     Tenderness: There is no abdominal tenderness. There is no right CVA tenderness or left CVA tenderness.     Hernia: No hernia is present.  Musculoskeletal:        General: Normal range of motion.     Cervical back: Normal range of motion and neck supple.     Right lower leg: No edema.     Left lower leg: No edema.  Lymphadenopathy:     Cervical: No cervical adenopathy.  Skin:    General: Skin is warm and dry.     Capillary Refill: Capillary refill takes less than 2 seconds.     Coloration: Skin is not cyanotic, jaundiced or pale.     Findings: No rash.  Neurological:     General: No focal deficit present.     Mental Status: He is alert and oriented to person, place, and time.     Sensory: Sensation is intact.     Motor: Motor function is intact.     Coordination: Coordination is intact.     Gait: Gait is intact.     Deep Tendon Reflexes: Reflexes are normal and symmetric.  Psychiatric:        Attention and Perception: Attention and perception normal.        Mood and Affect: Mood and affect normal.        Speech: Speech normal.        Behavior: Behavior normal. Behavior is cooperative.        Thought Content: Thought content normal.        Cognition and Memory: Cognition and memory normal.        Judgment: Judgment normal.     Results for orders placed or performed in visit on 07/09/22  Urine Culture   Specimen: Urine   UR  Result Value Ref Range   Urine Culture, Routine Final report (A)    Organism ID, Bacteria Escherichia coli (A)    Antimicrobial Susceptibility  Comment   Microscopic Examination   Urine  Result Value Ref Range   WBC, UA 11-30 (A) 0 - 5 /hpf   RBC, Urine 0-2 0 - 2 /hpf   Epithelial Cells (non renal) 0-10 0 - 10 /hpf   Renal Epithel, UA None seen None seen /hpf   Bacteria, UA Few (A) None seen/Few  Urinalysis, Routine w reflex microscopic  Result Value Ref Range   Specific Gravity, UA 1.015 1.005 - 1.030   pH, UA 6.0 5.0 - 7.5   Color, UA Yellow Yellow   Appearance Ur Clear Clear   Leukocytes,UA Trace (A) Negative   Protein,UA 1+ (A) Negative/Trace   Glucose, UA 3+ (A) Negative   Ketones, UA Negative Negative   RBC, UA Trace (A) Negative   Bilirubin, UA Negative Negative   Urobilinogen, Ur 0.2 0.2 - 1.0 mg/dL   Nitrite, UA Negative Negative   Microscopic  Examination See below:        Pertinent labs & imaging results that were available during my care of the patient were reviewed by me and considered in my medical decision making.  Assessment & Plan:  Scott was seen today for diabetes.  Diagnoses and all orders for this visit:  Type 2 diabetes mellitus with other specified complication, without long-term current use of insulin (HCC) A1C 7.2, no changes to regimen. Diet and exercise encouraged. Follow up in 3 months for reevaluation.  -     Bayer DCA Hb A1c Waived  Hypertension associated with type 2 diabetes mellitus (HCC) BP well controlled. Changes were not made in regimen today. Goal BP is 130/80. Pt aware to report any persistent high or low readings. DASH diet and exercise encouraged. Exercise at least 150 minutes per week and increase as tolerated. Goal BMI > 25. Stress management encouraged. Avoid nicotine and tobacco product use. Avoid excessive alcohol and NSAID's. Avoid more than 2000 mg of sodium daily. Medications as prescribed. Follow up as scheduled.  -     CBC with Differential/Platelet -     CMP14+EGFR  Chronic systolic heart failure (HCC) Doing well and is followed by cardiology on a regular basis.  Euvolemic in office.  -     furosemide (LASIX) 20 MG tablet; Take 1 tablet (20 mg total) by mouth daily as needed for edema.  Gastro-esophageal reflux disease without esophagitis No red flags present. Diet discussed. Avoid fried, spicy, fatty, greasy, and acidic foods. Avoid caffeine, nicotine, and alcohol. Do not eat 2-3 hours before bedtime and stay upright for at least 1-2 hours after eating. Eat small frequent meals. Avoid NSAID's like motrin and aleve. Medications as prescribed. Report any new or worsening symptoms. Follow up as discussed or sooner if needed.   -     pantoprazole (PROTONIX) 40 MG tablet; Take 1 tablet (40 mg total) by mouth daily.     Continue all other maintenance medications.  Follow up plan: Return in about 3 months (around 10/21/2022) for chronic follow up.   Continue healthy lifestyle choices, including diet (rich in fruits, vegetables, and lean proteins, and low in salt and simple carbohydrates) and exercise (at least 30 minutes of moderate physical activity daily).  Educational handout given for DM  The above assessment and management plan was discussed with the patient. The patient verbalized understanding of and has agreed to the management plan. Patient is aware to call the clinic if they develop any new symptoms or if symptoms persist or worsen. Patient is aware when to return to the clinic for a follow-up visit. Patient educated on when it is appropriate to go to the emergency department.   Kari Baars, FNP-C [facility] [phone]

## 2022-07-31 ENCOUNTER — Other Ambulatory Visit: Payer: Self-pay | Admitting: Cardiology

## 2022-07-31 ENCOUNTER — Ambulatory Visit: Payer: Medicare Other | Admitting: Family Medicine

## 2022-08-03 NOTE — Progress Notes (Signed)
Remote ICD transmission.   

## 2022-08-10 ENCOUNTER — Ambulatory Visit: Payer: Medicare Other

## 2022-08-10 DIAGNOSIS — Z95810 Presence of automatic (implantable) cardiac defibrillator: Secondary | ICD-10-CM | POA: Diagnosis not present

## 2022-08-10 DIAGNOSIS — I5022 Chronic systolic (congestive) heart failure: Secondary | ICD-10-CM | POA: Diagnosis not present

## 2022-08-11 NOTE — Progress Notes (Signed)
EPIC Encounter for ICM Monitoring  Patient Name: Omar Williams is a 87 y.o. male Date: 08/11/2022 Primary Care Physican: Sonny Masters, FNP Primary Cardiologist:  Wyline Mood Electrophysiologist: Mealor Nephrologist: [facility] Bi-V Pacing: 98.5% 10/22/2021 Weight: 161-162 lbs 04/27/2022 Weight: 160 lbs 06/05/2022 Office Weight: 165 lbs 08/11/2022 Weight: 162 lbs    Since 08-Jul-2022 Time in AT/AF  0.0 hr/day (0.0%)   Spoke with patient and heart failure questions reviewed.  Transmission results reviewed.  Pt asymptomatic for fluid accumulation. He was on vacation during the last week of June and eating foods high in salt and drinking >80-90 oz fluid daily.    Optivol Thoracic impedance suggesting possible fluid accumulation starting 6/16 and trending back toward baseline.   Prescribed:  Furosemide 20 mg take 1 tablet as needed for swelling.     Labs: 07/21/2022 Creatinine 1.44, BUN 25, Potassium 5.3, Sodium 140, GFR 48 05/25/2022 Creatinine 1.53, BUN 27, Potassium 4.8, Sodium 141, GFR 44 04/29/2022 Creatinine 1.50, BUN 27, Potassium 3.8, Sodium 143, GFR 46 A complete set of results can be found in Results Review.   Recommendations:   He will take Furosemide x 2 days.  Discussed limiting fluid intake to 64 oz daily.  Pt asked if he can use salt substitute. Per pt, nephrologist instructed him not to eat bananas due to high potassium.  Advised not to use salt substitutes due to very high in potassium.  Encouraged to use Mrs Sharilyn Sites seasonings   Follow-up plan: ICM clinic phone appointment on 08/17/2022 to recheck fluid levels.   91 day device clinic remote transmission 10/07/2022.       EP/Cardiology Office Visits:  06/04/2023 with Dr Nelly Laurence.  12/02/2022 with Dr Wyline Mood.        Copy of ICM check sent to Dr. Nelly Laurence and Dr Wyline Mood as Lorain Childes.     3 month ICM trend: 08/10/2022.    12-14 Month ICM trend:     Karie Soda, RN 08/11/2022 10:36 AM

## 2022-08-15 ENCOUNTER — Other Ambulatory Visit: Payer: Self-pay | Admitting: Family Medicine

## 2022-08-15 DIAGNOSIS — E119 Type 2 diabetes mellitus without complications: Secondary | ICD-10-CM

## 2022-08-17 ENCOUNTER — Ambulatory Visit: Payer: Medicare Other | Attending: Cardiovascular Disease

## 2022-08-17 DIAGNOSIS — I5022 Chronic systolic (congestive) heart failure: Secondary | ICD-10-CM

## 2022-08-17 DIAGNOSIS — Z95810 Presence of automatic (implantable) cardiac defibrillator: Secondary | ICD-10-CM

## 2022-08-18 ENCOUNTER — Encounter: Payer: Medicare Other | Admitting: Internal Medicine

## 2022-08-19 NOTE — Progress Notes (Signed)
EPIC Encounter for ICM Monitoring  Patient Name: Omar Williams is a 87 y.o. male Date: 08/19/2022 Primary Care Physican: Sonny Masters, FNP Primary Cardiologist:  Wyline Mood Electrophysiologist: Mealor Nephrologist: [facility] Bi-V Pacing: 99.3% 10/22/2021 Weight: 161-162 lbs 04/27/2022 Weight: 160 lbs 06/05/2022 Office Weight: 165 lbs 08/11/2022 Weight: 162 lbs    Since 10-Aug-2022 Time in AT/AF  0.0 hr/day (0.0%)   Spoke with patient and heart failure questions reviewed.  Transmission results reviewed.  Pt asymptomatic for fluid accumulation.     Optivol Thoracic impedance suggesting fluid levels returned to normal after taking extra Furosemide.   Prescribed:  Furosemide 20 mg take 1 tablet as needed for swelling.  Pt takes 1 tablet daily instead of PRN   Labs: 07/21/2022 Creatinine 1.44, BUN 25, Potassium 5.3, Sodium 140, GFR 48 05/25/2022 Creatinine 1.53, BUN 27, Potassium 4.8, Sodium 141, GFR 44 04/29/2022 Creatinine 1.50, BUN 27, Potassium 3.8, Sodium 143, GFR 46 A complete set of results can be found in Results Review.   Recommendations:   No changes and encouraged to call if experiencing any fluid symptoms.   Follow-up plan: ICM clinic phone appointment on 09/14/2022.   91 day device clinic remote transmission 10/07/2022.       EP/Cardiology Office Visits:  06/04/2023 with Dr Nelly Laurence.  12/02/2022 with Dr Wyline Mood.        Copy of ICM check sent to Dr. Nelly Laurence   3 month ICM trend: 08/17/2022.    12-14 Month ICM trend:     Karie Soda, RN 08/19/2022 10:30 AM

## 2022-08-27 ENCOUNTER — Ambulatory Visit: Payer: Medicare Other | Admitting: Urology

## 2022-09-03 ENCOUNTER — Encounter: Payer: Self-pay | Admitting: Urology

## 2022-09-03 ENCOUNTER — Ambulatory Visit: Payer: Medicare Other | Admitting: Urology

## 2022-09-03 VITALS — BP 97/60 | HR 80 | Ht 67.0 in | Wt 162.0 lb

## 2022-09-03 DIAGNOSIS — N138 Other obstructive and reflux uropathy: Secondary | ICD-10-CM | POA: Diagnosis not present

## 2022-09-03 DIAGNOSIS — N3281 Overactive bladder: Secondary | ICD-10-CM | POA: Diagnosis not present

## 2022-09-03 DIAGNOSIS — N401 Enlarged prostate with lower urinary tract symptoms: Secondary | ICD-10-CM | POA: Diagnosis not present

## 2022-09-03 DIAGNOSIS — R351 Nocturia: Secondary | ICD-10-CM

## 2022-09-03 DIAGNOSIS — N281 Cyst of kidney, acquired: Secondary | ICD-10-CM

## 2022-09-03 DIAGNOSIS — Z87442 Personal history of urinary calculi: Secondary | ICD-10-CM

## 2022-09-03 LAB — URINALYSIS, ROUTINE W REFLEX MICROSCOPIC
Bilirubin, UA: NEGATIVE
Glucose, UA: NEGATIVE
Ketones, UA: NEGATIVE
Leukocytes,UA: NEGATIVE
Nitrite, UA: NEGATIVE
Protein,UA: NEGATIVE
Specific Gravity, UA: <1.005 — ABNORMAL LOW (ref 1.005–1.030)
Urobilinogen, Ur: 0.2 mg/dL (ref 0.2–1.0)
pH, UA: 6 (ref 5.0–7.5)

## 2022-09-03 LAB — MICROSCOPIC EXAMINATION: Bacteria, UA: NONE SEEN

## 2022-09-03 MED ORDER — OXYBUTYNIN CHLORIDE 5 MG PO TABS
2.5000 mg | ORAL_TABLET | Freq: Three times a day (TID) | ORAL | 3 refills | Status: DC
Start: 2022-09-03 — End: 2023-01-11

## 2022-09-03 MED ORDER — TAMSULOSIN HCL 0.4 MG PO CAPS
0.4000 mg | ORAL_CAPSULE | ORAL | 3 refills | Status: DC
Start: 2022-09-03 — End: 2023-08-22

## 2022-09-03 MED ORDER — FINASTERIDE 5 MG PO TABS
5.0000 mg | ORAL_TABLET | Freq: Every day | ORAL | 3 refills | Status: DC
Start: 2022-09-03 — End: 2023-04-28

## 2022-09-03 NOTE — Progress Notes (Signed)
Subjective:  1. BPH with obstruction/lower urinary tract symptoms   2. Nocturia   3. History of nephrolithiasis   4. Renal cyst     .   Omar Williams returns today in f/u. Omar Williams has a history of stones and renal cysts.  Omar Williams has had no flank pain or hematuria.    Omar Williams remains on tamsulosin, finasteride and oxybutynin 2.5mg  TID.  His nocturia remains 3x but Omar Williams takes his diuretic in the evening. His UA has no glucose but Omar Williams is on [place].  The micro is unremarkable..  Omar Williams has no associated signs or symptoms.     ROS:  ROS:  A complete review of systems was performed.  All systems are negative except for pertinent findings as noted.   Review of Systems  Gastrointestinal:  Negative for heartburn.  Endo/Heme/Allergies:  Bruises/bleeds easily.  All other systems reviewed and are negative.   No Known Allergies  Outpatient Encounter Medications as of 09/03/2022  Medication Sig Note   aspirin 81 MG chewable tablet Chew 81 mg by mouth daily.    Bee Pollen 550 MG CAPS Take 2 capsules by mouth 2 (two) times daily.    chlorhexidine (PERIDEX) 0.12 % solution as needed.    Cholecalciferol (VITAMIN D3) 125 MCG (5000 UT) TABS Take 1 tablet by mouth daily.  06/16/2021: Takes once per week.    CINNAMON PO Take 2,000 mg by mouth in the morning and at bedtime.    cyanocobalamin (VITAMIN B12) 1000 MCG tablet Take 1,000 mcg by mouth 2 (two) times daily.    dapagliflozin propanediol (FARXIGA) 10 MG TABS tablet Take 1 tablet (10 mg total) by mouth daily before breakfast.    docusate sodium (COLACE) 50 MG capsule Take 100 mg by mouth 2 (two) times daily.    ezetimibe (ZETIA) 10 MG tablet TAKE 1 TABLET BY MOUTH EVERY DAY    furosemide (LASIX) 20 MG tablet Take 1 tablet (20 mg total) by mouth daily as needed for edema.    glimepiride (AMARYL) 4 MG tablet Take 1 tablet (4 mg total) by mouth in the morning and at bedtime.    glucose blood (ONETOUCH ULTRA) test strip Test BS daily Dx E11.9    lisinopril (ZESTRIL) 2.5  MG tablet Take 1 tablet (2.5 mg total) by mouth daily.    nitroGLYCERIN (NITROSTAT) 0.4 MG SL tablet Place 1 tablet (0.4 mg total) under the tongue every 5 (five) minutes x 3 doses as needed for chest pain.    Omega-3 Fatty Acids (FISH OIL) 1200 MG CAPS Take 1 capsule by mouth 2 (two) times daily.     pantoprazole (PROTONIX) 40 MG tablet Take 1 tablet (40 mg total) by mouth daily.    rosuvastatin (CRESTOR) 40 MG tablet TAKE 1 TABLET BY MOUTH DAILY    TURMERIC PO Take 1,000 mg by mouth 2 (two) times daily.    vitamin C (ASCORBIC ACID) 500 MG tablet Take 1,000 mg by mouth 2 (two) times daily.    [DISCONTINUED] finasteride (PROSCAR) 5 MG tablet TAKE 1 TABLET BY MOUTH EVERYDAY AT BEDTIME    [DISCONTINUED] oxybutynin (DITROPAN) 5 MG tablet Take 0.5 tablets (2.5 mg total) by mouth 3 (three) times daily.    [DISCONTINUED] tamsulosin (FLOMAX) 0.4 MG CAPS capsule TAKE 1 CAPSULE (0.4 MG TOTAL) BY MOUTH EVERY MORNING.    finasteride (PROSCAR) 5 MG tablet Take 1 tablet (5 mg total) by mouth daily.    oxybutynin (DITROPAN) 5 MG tablet Take 0.5 tablets (2.5 mg total) by  mouth 3 (three) times daily.    tamsulosin (FLOMAX) 0.4 MG CAPS capsule Take 1 capsule (0.4 mg total) by mouth every morning.    No facility-administered encounter medications on file as of 09/03/2022.    Past Medical History:  Diagnosis Date   Acid reflux    Blood transfusion without reported diagnosis    Cardiac arrest (HCC)    a. occuring in 12/2016. LAD disease but no targets for CABG or PCI. Underwent ICD placement as EF 15-20%.    CHF (congestive heart failure) (HCC)    Coronary artery disease    Diabetes mellitus without complication (HCC)    Diabetic nephropathy (HCC)    Hyperlipidemia    Hypertension    Myocardial infarction [facility])    Renal disorder     Past Surgical History:  Procedure Laterality Date   BIV ICD INSERTION CRT-D N/A 01/01/2017   Procedure: BIV ICD INSERTION CRT-D;  Surgeon: Regan Lemming, MD;   Location: [facility] INVASIVE CV LAB;  Service: Cardiovascular;  Laterality: N/A;    Social History   Socioeconomic History   Marital status: Married    Spouse name: johnna   Number of children: 3   Years of education: 12   Highest education level: Some college, no degree  Occupational History   Occupation: Health and safety inspector   retired   Occupation: Education officer, environmental  Tobacco Use   Smoking status: Former    Current packs/day: 0.00    Types: Cigarettes    Quit date: 1960    Years since quitting: 64.6    Passive exposure: Never   Smokeless tobacco: Never   Tobacco comments:    2-3 years as a teenager  Vaping Use   Vaping status: Never Used  Substance and Sexual Activity   Alcohol use: No   Drug use: No   Sexual activity: Not Currently  Other Topics Concern   Not on file  Social History Narrative   Lives with with wife [name]   Married 60 years   Daughter [name] stays frequently   Granddaughter [name] near by   Social Determinants of Health   Financial Resource Strain: Low Risk  (06/22/2022)   Overall Financial Resource Strain (CARDIA)    Difficulty of Paying Living Expenses: Not hard at all  Food Insecurity: No Food Insecurity (06/22/2022)   Hunger Vital Sign    Worried About Running Out of Food in the Last Year: Never true    Ran Out of Food in the Last Year: Never true  Transportation Needs: No Transportation Needs (06/22/2022)   PRAPARE - Administrator, Civil Service (Medical): No    Lack of Transportation (Non-Medical): No  Physical Activity: Sufficiently Active (06/22/2022)   Exercise Vital Sign    Days of Exercise per Week: 7 days    Minutes of Exercise per Session: 30 min  Stress: No Stress Concern Present (06/22/2022)   [employer] of Occupational Health - Occupational Stress Questionnaire    Feeling of Stress : Not at all  Social Connections: Socially Integrated (06/22/2022)   Social Connection and Isolation Panel [NHANES]    Frequency of Communication with  Friends and Family: More than three times a week    Frequency of Social Gatherings with Friends and Family: More than three times a week    Attends Religious Services: More than 4 times per year    Active Member of [employer] or Organizations: Yes    Attends Banker Meetings: More than 4 times per  year    Marital Status: Married  Catering manager Violence: Not At Risk (06/22/2022)   Humiliation, Afraid, Rape, and Kick questionnaire    Fear of Current or Ex-Partner: No    Emotionally Abused: No    Physically Abused: No    Sexually Abused: No    Family History  Problem Relation Age of Onset   Hypertension Mother    Hyperlipidemia Mother    CAD Sister    Diabetes Sister    Hyperlipidemia Sister    Hypertension Sister    CAD Brother    Diabetes Brother    Hypertension Brother    Hyperlipidemia Brother    CAD Brother    Diabetes Brother    Hypertension Brother    Hyperlipidemia Brother    Dementia Brother    Thyroid disease Daughter    Hypertension Daughter    Diabetes Daughter    Hyperlipidemia Daughter    Fibromyalgia Daughter    Heart disease Daughter        stents   Multiple sclerosis Daughter    Thyroid disease Daughter        Objective: Vitals:   09/03/22 1446  BP: 97/60  Pulse: 80     Physical Exam Vitals reviewed.  Constitutional:      Appearance: Normal appearance.  Neurological:     Mental Status: Omar Williams is alert.     Lab Results:  Results for orders placed or performed in visit on 09/03/22 (from the past 24 hour(s))  Urinalysis, Routine w reflex microscopic     Status: Abnormal   Collection Time: 09/03/22  3:02 PM  Result Value Ref Range   Specific Gravity, UA <1.005 (L) 1.005 - 1.030   pH, UA 6.0 5.0 - 7.5   Color, UA Yellow Yellow   Appearance Ur Clear Clear   Leukocytes,UA Negative Negative   Protein,UA Negative Negative/Trace   Glucose, UA Negative Negative   Ketones, UA Negative Negative   RBC, UA 1+ (A) Negative   Bilirubin,  UA Negative Negative   Urobilinogen, Ur 0.2 0.2 - 1.0 mg/dL   Nitrite, UA Negative Negative   Microscopic Examination See below:    Narrative   Performed at:  [address] - [facility] [address], [place], [place]  [id number] Lab Director: Chinita Pester MT, Phone:  [phone]  Microscopic Examination     Status: Abnormal   Collection Time: 09/03/22  3:02 PM   Urine  Result Value Ref Range   WBC, UA 0-5 0 - 5 /hpf   RBC, Urine 0-2 0 - 2 /hpf   Epithelial Cells (non renal) 0-10 0 - 10 /hpf   Casts Present (A) None seen /lpf   Cast Type Granular casts (A) N/A   Bacteria, UA None seen None seen/Few   Narrative   Performed at:  [address] - [facility] [address] [place], [place]  [id number] Lab Director: Chinita Pester MT, Phone:  [phone]      BMET No results for input(s): "NA", "K", "CL", "CO2", "GLUCOSE", "BUN", "CREATININE", "CALCIUM" in the last 72 hours. PSA No results found for: "PSA" No results found for: "TESTOSTERONE"    Studies/Results: No results found.    Assessment & Plan:  BPH with BOO and OAB.  I have refilled his meds.     Renal stones and cysts.   Omar Williams is asymptomatic.    Meds ordered this encounter  Medications   finasteride (PROSCAR) 5 MG tablet    Sig: Take 1 tablet (5 mg  total) by mouth daily.    Dispense:  90 tablet    Refill:  3   oxybutynin (DITROPAN) 5 MG tablet    Sig: Take 0.5 tablets (2.5 mg total) by mouth 3 (three) times daily.    Dispense:  135 tablet    Refill:  3   tamsulosin (FLOMAX) 0.4 MG CAPS capsule    Sig: Take 1 capsule (0.4 mg total) by mouth every morning.    Dispense:  90 capsule    Refill:  3     Orders Placed This Encounter  Procedures   Microscopic Examination   Urinalysis, Routine w reflex microscopic      Return in about 1 year (around 09/03/2023).   CC: Omar Masters, Omar Williams      Omar Williams 09/04/2022

## 2022-09-14 ENCOUNTER — Ambulatory Visit: Payer: Medicare Other | Attending: Cardiovascular Disease

## 2022-09-14 DIAGNOSIS — Z95810 Presence of automatic (implantable) cardiac defibrillator: Secondary | ICD-10-CM | POA: Diagnosis not present

## 2022-09-14 DIAGNOSIS — I5022 Chronic systolic (congestive) heart failure: Secondary | ICD-10-CM | POA: Diagnosis not present

## 2022-09-15 NOTE — Progress Notes (Signed)
EPIC Encounter for ICM Monitoring  Patient Name: Omar Williams is a 87 y.o. male Date: 09/15/2022 Primary Care Physican: Sonny Masters, FNP Primary Cardiologist:  Wyline Mood Electrophysiologist: Mealor Nephrologist: [facility] Bi-V Pacing: 98.8% 10/22/2021 Weight: 161-162 lbs 04/27/2022 Weight: 160 lbs 06/05/2022 Office Weight: 165 lbs 08/11/2022 Weight: 162 lbs  09/15/2022 Weight: 162 lbs   Since 10-Aug-2022 Time in AT/AF  0.0 hr/day (0.0%)   Spoke with patient and heart failure questions reviewed.  Transmission results reviewed.  Pt asymptomatic for fluid accumulation.  He has been traveling some within the last 2 weeks.   Optivol Thoracic impedance suggesting possible fluid accumulation starting 7/23 and returned close to normal on 8/12.    Prescribed:  Furosemide 20 mg take 1 tablet as needed for swelling.  Pt takes 1 tablet daily instead of PRN   Labs: 07/21/2022 Creatinine 1.44, BUN 25, Potassium 5.3, Sodium 140, GFR 48 05/25/2022 Creatinine 1.53, BUN 27, Potassium 4.8, Sodium 141, GFR 44 04/29/2022 Creatinine 1.50, BUN 27, Potassium 3.8, Sodium 143, GFR 46 A complete set of results can be found in Results Review.   Recommendations:   Recommendation to limit salt intake to 2000 mg daily and fluid intake to 64 oz daily.  Encouraged to call if experiencing any fluid symptoms.    Follow-up plan: ICM clinic phone appointment on 10/16/2022.   91 day device clinic remote transmission 10/07/2022.       EP/Cardiology Office Visits:  06/04/2023 with Dr Nelly Laurence.  12/02/2022 with Dr Wyline Mood.        Copy of ICM check sent to Dr. Nelly Laurence   3 month ICM trend: 09/14/2022.    12-14 Month ICM trend:     Karie Soda, RN 09/15/2022 1:46 PM

## 2022-09-17 DIAGNOSIS — N189 Chronic kidney disease, unspecified: Secondary | ICD-10-CM | POA: Diagnosis not present

## 2022-09-17 DIAGNOSIS — R809 Proteinuria, unspecified: Secondary | ICD-10-CM | POA: Diagnosis not present

## 2022-09-17 DIAGNOSIS — D631 Anemia in chronic kidney disease: Secondary | ICD-10-CM | POA: Diagnosis not present

## 2022-09-22 ENCOUNTER — Encounter: Payer: Self-pay | Admitting: Family Medicine

## 2022-09-22 ENCOUNTER — Telehealth: Payer: Self-pay | Admitting: Pharmacist

## 2022-09-22 ENCOUNTER — Ambulatory Visit (INDEPENDENT_AMBULATORY_CARE_PROVIDER_SITE_OTHER): Payer: Medicare Other | Admitting: Family Medicine

## 2022-09-22 DIAGNOSIS — N1832 Chronic kidney disease, stage 3b: Secondary | ICD-10-CM

## 2022-09-22 DIAGNOSIS — I5022 Chronic systolic (congestive) heart failure: Secondary | ICD-10-CM

## 2022-09-22 MED ORDER — DAPAGLIFLOZIN PROPANEDIOL 10 MG PO TABS: 10.0000 mg | ORAL_TABLET | Freq: Every day | ORAL | 5 refills | Status: DC

## 2022-09-22 NOTE — Telephone Encounter (Signed)
Refills for farxiga sent to [facility] ([facility]) Patient will need to re-enroll for PAP for 2025

## 2022-09-23 NOTE — Progress Notes (Signed)
Not seen, no charge

## 2022-09-24 DIAGNOSIS — R809 Proteinuria, unspecified: Secondary | ICD-10-CM | POA: Diagnosis not present

## 2022-09-24 DIAGNOSIS — N1832 Chronic kidney disease, stage 3b: Secondary | ICD-10-CM | POA: Diagnosis not present

## 2022-09-24 DIAGNOSIS — E1122 Type 2 diabetes mellitus with diabetic chronic kidney disease: Secondary | ICD-10-CM | POA: Diagnosis not present

## 2022-09-24 DIAGNOSIS — E1129 Type 2 diabetes mellitus with other diabetic kidney complication: Secondary | ICD-10-CM | POA: Diagnosis not present

## 2022-10-07 ENCOUNTER — Ambulatory Visit (INDEPENDENT_AMBULATORY_CARE_PROVIDER_SITE_OTHER): Payer: Medicare Other

## 2022-10-07 DIAGNOSIS — I255 Ischemic cardiomyopathy: Secondary | ICD-10-CM

## 2022-10-07 LAB — CUP PACEART REMOTE DEVICE CHECK
Battery Remaining Longevity: 21 mo
Battery Voltage: 2.92 V
Brady Statistic AP VP Percent: 48.33 %
Brady Statistic AP VS Percent: 0.02 %
Brady Statistic AS VP Percent: 50.81 %
Brady Statistic AS VS Percent: 0.84 %
Brady Statistic RA Percent Paced: 48.32 %
Brady Statistic RV Percent Paced: 96.93 %
Date Time Interrogation Session: 20240904001705
HighPow Impedance: 75 Ohm
Implantable Lead Connection Status: 753985
Implantable Lead Connection Status: 753985
Implantable Lead Connection Status: 753985
Implantable Lead Implant Date: 20181130
Implantable Lead Implant Date: 20181130
Implantable Lead Implant Date: 20181130
Implantable Lead Location: 753858
Implantable Lead Location: 753859
Implantable Lead Location: 753860
Implantable Lead Model: 4598
Implantable Lead Model: 5076
Implantable Lead Model: 6935
Implantable Pulse Generator Implant Date: 20181130
Lead Channel Impedance Value: 128.074
Lead Channel Impedance Value: 136.276
Lead Channel Impedance Value: 141.867
Lead Channel Impedance Value: 141.867
Lead Channel Impedance Value: 152 Ohm
Lead Channel Impedance Value: 247 Ohm
Lead Channel Impedance Value: 266 Ohm
Lead Channel Impedance Value: 304 Ohm
Lead Channel Impedance Value: 304 Ohm
Lead Channel Impedance Value: 323 Ohm
Lead Channel Impedance Value: 380 Ohm
Lead Channel Impedance Value: 494 Ohm
Lead Channel Impedance Value: 494 Ohm
Lead Channel Impedance Value: 513 Ohm
Lead Channel Impedance Value: 513 Ohm
Lead Channel Impedance Value: 513 Ohm
Lead Channel Impedance Value: 570 Ohm
Lead Channel Impedance Value: 646 Ohm
Lead Channel Pacing Threshold Amplitude: 0.375 V
Lead Channel Pacing Threshold Amplitude: 0.625 V
Lead Channel Pacing Threshold Amplitude: 1.375 V
Lead Channel Pacing Threshold Pulse Width: 0.4 ms
Lead Channel Pacing Threshold Pulse Width: 0.4 ms
Lead Channel Pacing Threshold Pulse Width: 0.4 ms
Lead Channel Sensing Intrinsic Amplitude: 2.5 mV
Lead Channel Sensing Intrinsic Amplitude: 2.5 mV
Lead Channel Sensing Intrinsic Amplitude: 8.75 mV
Lead Channel Sensing Intrinsic Amplitude: 8.75 mV
Lead Channel Setting Pacing Amplitude: 2 V
Lead Channel Setting Pacing Amplitude: 2 V
Lead Channel Setting Pacing Amplitude: 2.5 V
Lead Channel Setting Pacing Pulse Width: 0.4 ms
Lead Channel Setting Pacing Pulse Width: 0.4 ms
Lead Channel Setting Sensing Sensitivity: 0.3 mV
Zone Setting Status: 755011
Zone Setting Status: 755011

## 2022-10-10 ENCOUNTER — Other Ambulatory Visit: Payer: Self-pay | Admitting: Cardiology

## 2022-10-16 ENCOUNTER — Ambulatory Visit: Payer: Medicare Other | Attending: Cardiovascular Disease

## 2022-10-16 DIAGNOSIS — I5022 Chronic systolic (congestive) heart failure: Secondary | ICD-10-CM

## 2022-10-16 DIAGNOSIS — Z95810 Presence of automatic (implantable) cardiac defibrillator: Secondary | ICD-10-CM

## 2022-10-16 NOTE — Progress Notes (Signed)
EPIC Encounter for ICM Monitoring  Patient Name: Omar Williams is a 87 y.o. male Date: 10/16/2022 Primary Care Physican: Sonny Masters, FNP Primary Cardiologist:  Wyline Mood Electrophysiologist: Mealor Nephrologist: [facility] Bi-V Pacing: 98.8% 10/22/2021 Weight: 161-162 lbs 04/27/2022 Weight: 160 lbs 06/05/2022 Office Weight: 165 lbs 08/11/2022 Weight: 162 lbs  09/15/2022 Weight: 162 lbs   Since 10-Aug-2022 Time in AT/AF  0.0 hr/day (0.0%)   Spoke with patient and heart failure questions reviewed.  Transmission results reviewed.  Pt asymptomatic for fluid accumulation.  Reports feeling well at this time and voices no complaints.     Optivol Thoracic impedance suggesting possible fluid accumulation starting 9/6 and returning toward baseline.    Prescribed:  Furosemide 20 mg take 1 tablet as needed for swelling.  Pt takes 1 tablet daily instead of PRN   Labs: 07/21/2022 Creatinine 1.44, BUN 25, Potassium 5.3, Sodium 140, GFR 48 05/25/2022 Creatinine 1.53, BUN 27, Potassium 4.8, Sodium 141, GFR 44 04/29/2022 Creatinine 1.50, BUN 27, Potassium 3.8, Sodium 143, GFR 46 A complete set of results can be found in Results Review.   Recommendations:  No changes and encouraged to call if experiencing any fluid symptoms.   Follow-up plan: ICM clinic phone appointment on 11/18/2022.   91 day device clinic remote transmission 01/06/2023.       EP/Cardiology Office Visits:  06/04/2023 with Dr Nelly Laurence.  12/02/2022 with Dr Wyline Mood.        Copy of ICM check sent to Dr. Nelly Laurence   3 month ICM trend: 10/16/2022.    12-14 Month ICM trend:     Karie Soda, RN 10/16/2022 7:09 AM

## 2022-10-20 NOTE — Progress Notes (Signed)
Remote ICD transmission.   

## 2022-10-27 ENCOUNTER — Ambulatory Visit: Payer: Medicare Other | Admitting: Family Medicine

## 2022-10-27 ENCOUNTER — Encounter: Payer: Self-pay | Admitting: Family Medicine

## 2022-10-27 VITALS — BP 118/71 | HR 60 | Temp 97.8°F | Ht 67.0 in | Wt 166.0 lb

## 2022-10-27 DIAGNOSIS — I130 Hypertensive heart and chronic kidney disease with heart failure and stage 1 through stage 4 chronic kidney disease, or unspecified chronic kidney disease: Secondary | ICD-10-CM

## 2022-10-27 DIAGNOSIS — I5022 Chronic systolic (congestive) heart failure: Secondary | ICD-10-CM

## 2022-10-27 DIAGNOSIS — Z7984 Long term (current) use of oral hypoglycemic drugs: Secondary | ICD-10-CM

## 2022-10-27 DIAGNOSIS — Z23 Encounter for immunization: Secondary | ICD-10-CM

## 2022-10-27 DIAGNOSIS — E1159 Type 2 diabetes mellitus with other circulatory complications: Secondary | ICD-10-CM | POA: Diagnosis not present

## 2022-10-27 DIAGNOSIS — E1169 Type 2 diabetes mellitus with other specified complication: Secondary | ICD-10-CM | POA: Diagnosis not present

## 2022-10-27 DIAGNOSIS — E785 Hyperlipidemia, unspecified: Secondary | ICD-10-CM

## 2022-10-27 DIAGNOSIS — E119 Type 2 diabetes mellitus without complications: Secondary | ICD-10-CM | POA: Insufficient documentation

## 2022-10-27 DIAGNOSIS — E1122 Type 2 diabetes mellitus with diabetic chronic kidney disease: Secondary | ICD-10-CM

## 2022-10-27 DIAGNOSIS — I25118 Atherosclerotic heart disease of native coronary artery with other forms of angina pectoris: Secondary | ICD-10-CM | POA: Diagnosis not present

## 2022-10-27 DIAGNOSIS — I152 Hypertension secondary to endocrine disorders: Secondary | ICD-10-CM

## 2022-10-27 DIAGNOSIS — N1832 Chronic kidney disease, stage 3b: Secondary | ICD-10-CM | POA: Diagnosis not present

## 2022-10-27 DIAGNOSIS — N183 Chronic kidney disease, stage 3 unspecified: Secondary | ICD-10-CM

## 2022-10-27 LAB — BAYER DCA HB A1C WAIVED: HB A1C (BAYER DCA - WAIVED): 6.6 % — ABNORMAL HIGH (ref 4.8–5.6)

## 2022-10-27 MED ORDER — LISINOPRIL 2.5 MG PO TABS
2.5000 mg | ORAL_TABLET | Freq: Every day | ORAL | 1 refills | Status: DC
Start: 2022-10-27 — End: 2023-04-28

## 2022-10-27 NOTE — Patient Instructions (Signed)

## 2022-10-27 NOTE — Progress Notes (Signed)
Subjective:  Patient ID: Omar Williams, male    DOB: 12-03-1935, 87 y.o.   MRN: [id number]  Patient Care Team: Sonny Masters, FNP as PCP - General (Family Medicine) Wyline Mood, Dorothe Pea, MD as PCP - Cardiology (Cardiology) Mealor, Roberts Gaudy, MD as PCP - Electrophysiology (Cardiology) Genelle Gather, OD (Optometry) Netta Neat., NP (Inactive) as Nurse Practitioner (Cardiology) Randa Lynn, MD as Consulting Physician (Nephrology) Bjorn Pippin, MD as Attending Physician (Urology) Franky Macho, MD as Consulting Physician (General Surgery) Danella Maiers, [facility] as Pharmacist (Family Medicine)   Chief Complaint:  Medical Management of Chronic Issues (3 month chronic follow up )   HPI: Omar Williams is a 87 y.o. male presenting on 10/27/2022 for Medical Management of Chronic Issues (3 month chronic follow up )    1. Type 2 diabetes mellitus with other specified complication, without long-term current use of insulin (HCC) 2. Diabetes mellitus treated with oral medication (HCC) Pt is currently on glimepiride and Farxiga,  tolerating well. Reports blood sugars are running in the 80-115 range. No polyuria, polydipsia, or polyphagia.   3. Hypertension associated with type 2 diabetes mellitus (HCC) Complaint with meds - Yes Current Medications - Lisinopril Exercising Regularly - active daily Watching Salt intake - Yes Pertinent ROS:  Headache - No Fatigue - No Visual Disturbances - No Chest pain - No Dyspnea - No Palpitations - No LE edema - Yes They report good compliance with medications and can restate their regimen by memory. No medication side effects.  BP Readings from Last 3 Encounters:  10/27/22 118/71  09/03/22 97/60  07/21/22 119/68     4. CKD stage 3 due to type 2 diabetes mellitus (HCC) On Farxiga. Denies any changes in urinary output. No weakness, confusion, incresaed swelling, or fatigue.   5. Coronary artery disease of native artery of native heart  with stable angina pectoris (HCC) Denies anginal symptoms today. Is followed by cardiology on a regular basis. Compliant with GDMT.   6. Chronic systolic heart failure (HCC) Does report small amount of swelling in legs. No orthopnea or PND. No chest pain or shortness of breath.  7. Hyperlipidemia associated with type 2 diabetes mellitus (HCC) Compliant with medications - Yes Current medications - Zetia, Crestor Side effects from medications - No Diet - generally healthy Exercise - active daily.    He reports he tipped and fell on Saturday causing a skin tear to his right elbow and right knee. States he has been dressing them daily with neosporin and a bandage. No drainage or surrounding erythema. Tetanus is up to date.     Relevant past medical, surgical, family, and social history reviewed and updated as indicated.  Allergies and medications reviewed and updated. Data reviewed: Chart in Epic.   Past Medical History:  Diagnosis Date   Acid reflux    Blood transfusion without reported diagnosis    Cardiac arrest (HCC)    a. occuring in 12/2016. LAD disease but no targets for CABG or PCI. Underwent ICD placement as EF 15-20%.    CHF (congestive heart failure) (HCC)    Coronary artery disease    Diabetes mellitus without complication (HCC)    Diabetic nephropathy (HCC)    Hyperlipidemia    Hypertension    Myocardial infarction [facility])    Renal disorder     Past Surgical History:  Procedure Laterality Date   BIV ICD INSERTION CRT-D N/A 01/01/2017   Procedure: BIV ICD INSERTION CRT-D;  Surgeon: Regan Lemming, MD;  Location: [facility] INVASIVE CV LAB;  Service: Cardiovascular;  Laterality: N/A;    Social History   Socioeconomic History   Marital status: Married    Spouse name: Omar Williams   Number of children: 3   Years of education: 12   Highest education level: Some college, no degree  Occupational History   Occupation: Health and safety inspector   retired   Occupation: Education officer, environmental   Tobacco Use   Smoking status: Former    Current packs/day: 0.00    Types: Cigarettes    Quit date: 1960    Years since quitting: 64.7    Passive exposure: Never   Smokeless tobacco: Never   Tobacco comments:    2-3 years as a teenager  Vaping Use   Vaping status: Never Used  Substance and Sexual Activity   Alcohol use: No   Drug use: No   Sexual activity: Not Currently  Other Topics Concern   Not on file  Social History Narrative   Lives with with wife Omar Williams   Married 60 years   Daughter [name] stays frequently   Granddaughter [name] near by   Social Determinants of Health   Financial Resource Strain: Low Risk  (06/22/2022)   Overall Financial Resource Strain (CARDIA)    Difficulty of Paying Living Expenses: Not hard at all  Food Insecurity: No Food Insecurity (06/22/2022)   Hunger Vital Sign    Worried About Running Out of Food in the Last Year: Never true    Ran Out of Food in the Last Year: Never true  Transportation Needs: No Transportation Needs (06/22/2022)   PRAPARE - Administrator, Civil Service (Medical): No    Lack of Transportation (Non-Medical): No  Physical Activity: Sufficiently Active (06/22/2022)   Exercise Vital Sign    Days of Exercise per Week: 7 days    Minutes of Exercise per Session: 30 min  Stress: No Stress Concern Present (06/22/2022)   [employer] of Occupational Health - Occupational Stress Questionnaire    Feeling of Stress : Not at all  Social Connections: Socially Integrated (06/22/2022)   Social Connection and Isolation Panel [NHANES]    Frequency of Communication with Friends and Family: More than three times a week    Frequency of Social Gatherings with Friends and Family: More than three times a week    Attends Religious Services: More than 4 times per year    Active Member of [employer] or Organizations: Yes    Attends Engineer, structural: More than 4 times per year    Marital Status: Married  Catering manager  Violence: Not At Risk (06/22/2022)   Humiliation, Afraid, Rape, and Kick questionnaire    Fear of Current or Ex-Partner: No    Emotionally Abused: No    Physically Abused: No    Sexually Abused: No    Outpatient Encounter Medications as of 10/27/2022  Medication Sig   aspirin 81 MG chewable tablet Chew 81 mg by mouth daily.   Bee Pollen 550 MG CAPS Take 2 capsules by mouth 2 (two) times daily.   chlorhexidine (PERIDEX) 0.12 % solution as needed.   Cholecalciferol (VITAMIN D3) 125 MCG (5000 UT) TABS Take 1 tablet by mouth daily.    CINNAMON PO Take 2,000 mg by mouth in the morning and at bedtime.   cyanocobalamin (VITAMIN B12) 1000 MCG tablet Take 1,000 mcg by mouth 2 (two) times daily.   dapagliflozin propanediol (FARXIGA) 10 MG TABS tablet Take  1 tablet (10 mg total) by mouth daily before breakfast.   docusate sodium (COLACE) 50 MG capsule Take 100 mg by mouth 2 (two) times daily.   ezetimibe (ZETIA) 10 MG tablet TAKE 1 TABLET BY MOUTH DAILY   finasteride (PROSCAR) 5 MG tablet Take 1 tablet (5 mg total) by mouth daily.   furosemide (LASIX) 20 MG tablet Take 1 tablet (20 mg total) by mouth daily as needed for edema.   glimepiride (AMARYL) 4 MG tablet Take 1 tablet (4 mg total) by mouth in the morning and at bedtime.   glucose blood (ONETOUCH ULTRA) test strip Test BS daily Dx E11.9   nitroGLYCERIN (NITROSTAT) 0.4 MG SL tablet Place 1 tablet (0.4 mg total) under the tongue every 5 (five) minutes x 3 doses as needed for chest pain.   Omega-3 Fatty Acids (FISH OIL) 1200 MG CAPS Take 1 capsule by mouth 2 (two) times daily.    oxybutynin (DITROPAN) 5 MG tablet Take 0.5 tablets (2.5 mg total) by mouth 3 (three) times daily.   pantoprazole (PROTONIX) 40 MG tablet Take 1 tablet (40 mg total) by mouth daily.   rosuvastatin (CRESTOR) 40 MG tablet TAKE 1 TABLET BY MOUTH DAILY   tamsulosin (FLOMAX) 0.4 MG CAPS capsule Take 1 capsule (0.4 mg total) by mouth every morning.   TURMERIC PO Take 1,000 mg by  mouth 2 (two) times daily.   vitamin C (ASCORBIC ACID) 500 MG tablet Take 1,000 mg by mouth 2 (two) times daily.   [DISCONTINUED] lisinopril (ZESTRIL) 2.5 MG tablet Take 1 tablet (2.5 mg total) by mouth daily.   lisinopril (ZESTRIL) 2.5 MG tablet Take 1 tablet (2.5 mg total) by mouth daily.   No facility-administered encounter medications on file as of 10/27/2022.    No Known Allergies  Review of Systems  Constitutional:  Negative for activity change, appetite change, chills, diaphoresis, fatigue, fever and unexpected weight change.  HENT: Negative.    Eyes: Negative.  Negative for photophobia and visual disturbance.  Respiratory:  Negative for cough, chest tightness and shortness of breath.   Cardiovascular:  Positive for leg swelling. Negative for chest pain and palpitations.  Gastrointestinal:  Negative for blood in stool, constipation, diarrhea, nausea and vomiting.  Endocrine: Negative.  Negative for polydipsia, polyphagia and polyuria.  Genitourinary:  Negative for decreased urine volume, difficulty urinating, dysuria, frequency and urgency.  Musculoskeletal:  Negative for arthralgias and myalgias.  Skin:  Positive for wound.  Allergic/Immunologic: Negative.   Neurological:  Negative for dizziness, tremors, seizures, syncope, facial asymmetry, speech difficulty, weakness, light-headedness, numbness and headaches.  Hematological: Negative.   Psychiatric/Behavioral:  Negative for confusion, hallucinations, sleep disturbance and suicidal ideas.   All other systems reviewed and are negative.       Objective:  BP 118/71   Pulse 60   Temp 97.8 F (36.6 C) (Temporal)   Ht 5\' 7"  (1.702 m)   Wt 166 lb (75.3 kg)   SpO2 95%   BMI 26.00 kg/m    Wt Readings from Last 3 Encounters:  10/27/22 166 lb (75.3 kg)  09/03/22 162 lb (73.5 kg)  07/21/22 162 lb 6.4 oz (73.7 kg)    Physical Exam Vitals and nursing note reviewed.  Constitutional:      General: He is not in acute  distress.    Appearance: Normal appearance. He is normal weight. He is not ill-appearing, toxic-appearing or diaphoretic.  HENT:     Head: Normocephalic and atraumatic.     Nose: Nose normal.  Mouth/Throat:     Mouth: Mucous membranes are moist.  Eyes:     Conjunctiva/sclera: Conjunctivae normal.     Pupils: Pupils are equal, round, and reactive to light.  Cardiovascular:     Rate and Rhythm: Normal rate and regular rhythm.     Pulses:          Dorsalis pedis pulses are 2+ on the right side and 2+ on the left side.       Posterior tibial pulses are 2+ on the right side and 2+ on the left side.     Heart sounds: Normal heart sounds. No murmur heard.    No friction rub. No gallop.  Pulmonary:     Effort: Pulmonary effort is normal.     Breath sounds: Normal breath sounds.  Abdominal:     General: Bowel sounds are normal.     Palpations: Abdomen is soft.  Musculoskeletal:     Cervical back: Normal range of motion and neck supple.     Right lower leg: 1+ Edema present.     Left lower leg: 1+ Edema present.  Feet:     Right foot:     Protective Sensation: 10 sites tested.  10 sites sensed.     Skin integrity: Skin integrity normal.     Left foot:     Protective Sensation: 10 sites tested.  10 sites sensed.     Skin integrity: Skin integrity normal.  Skin:    General: Skin is warm and dry.     Capillary Refill: Capillary refill takes less than 2 seconds.  Neurological:     General: No focal deficit present.     Mental Status: He is alert and oriented to person, place, and time.  Psychiatric:        Mood and Affect: Mood normal.        Behavior: Behavior normal.        Thought Content: Thought content normal.        Judgment: Judgment normal.     Results for orders placed or performed in visit on 10/07/22  CUP PACEART REMOTE DEVICE CHECK  Result Value Ref Range   Date Time Interrogation Session [id number]    Pulse Generator Manufacturer MERM    Pulse Gen Model  [id number] Claria MRI Quad CRT-D    Pulse Gen Serial Number [id number]    Clinic Name [facility]    Implantable Pulse Generator Type Cardiac Resynch Therapy Defibulator    Implantable Pulse Generator Implant Date 28413244    Implantable Lead Manufacturer MERM    Implantable Lead Model 4598 Ian Bushman MRI SureScan    Implantable Lead Serial Number [id number]    Implantable Lead Implant Date [id number]    Implantable Lead Location Detail 1 UNKNOWN    Implantable Lead Location [id number]    Implantable Lead Connection Status [id number]    Implantable Lead Manufacturer MERM    Implantable Lead Model 5076 CapSureFix Novus MRI SureScan    Implantable Lead Serial Number [id number]    Implantable Lead Implant Date 66440347    Implantable Lead Location Detail 1 UNKNOWN    Implantable Lead Location [id number]    Implantable Lead Connection Status [id number]    Implantable Lead Manufacturer [facility]    Implantable Lead Model 6935 Sprint Quattro Secure S MRI SureScan    Implantable Lead Serial Number [id number]    Implantable Lead Implant Date 42595638    Implantable Lead Location Detail 1 UNKNOWN    Implantable Lead Location [id number]  Implantable Lead Connection Status [phone]    Lead Channel Setting Sensing Sensitivity 0.3 mV   Lead Channel Setting Pacing Amplitude 2 V   Lead Channel Setting Pacing Pulse Width 0.4 ms   Lead Channel Setting Pacing Amplitude 2.5 V   Lead Channel Setting Pacing Pulse Width 0.4 ms   Lead Channel Setting Pacing Amplitude 2 V   Lead Channel Setting Pacing Capture Mode Adaptive Capture    Zone Setting Status Active    Zone Setting Status Inactive    Zone Setting Status Inactive    Zone Setting Status 755011    Zone Setting Status [phone]    Lead Channel Impedance Value 323 ohm   Lead Channel Sensing Intrinsic Amplitude 2.5 mV   Lead Channel Sensing Intrinsic Amplitude 2.5 mV   Lead Channel Pacing Threshold Amplitude 0.625 V   Lead Channel Pacing Threshold Pulse Width 0.4 ms    Lead Channel Impedance Value 646 ohm   Lead Channel Impedance Value 570 ohm   Lead Channel Sensing Intrinsic Amplitude 8.75 mV   Lead Channel Sensing Intrinsic Amplitude 8.75 mV   Lead Channel Pacing Threshold Amplitude 0.375 V   Lead Channel Pacing Threshold Pulse Width 0.4 ms   HighPow Impedance 75 ohm   Lead Channel Impedance Value 513 ohm   Lead Channel Impedance Value 513 ohm   Lead Channel Impedance Value 513 ohm   Lead Channel Impedance Value 380 ohm   Lead Channel Impedance Value 494 ohm   Lead Channel Impedance Value 494 ohm   Lead Channel Impedance Value 304 ohm   Lead Channel Impedance Value 304 ohm   Lead Channel Impedance Value 247 ohm   Lead Channel Impedance Value 266 ohm   Lead Channel Impedance Value 152 ohm   Lead Channel Impedance Value 136.276    Lead Channel Impedance Value 141.867    Lead Channel Impedance Value 141.867    Lead Channel Impedance Value 128.074    Lead Channel Pacing Threshold Amplitude 1.375 V   Lead Channel Pacing Threshold Pulse Width 0.4 ms   Battery Status OK    Battery Remaining Longevity 21 mo   Battery Voltage 2.92 V   Brady Statistic RA Percent Paced 48.32 %   Brady Statistic RV Percent Paced 96.93 %   Brady Statistic AP VP Percent 48.33 %   Brady Statistic AS VP Percent 50.81 %   Brady Statistic AP VS Percent 0.02 %   Brady Statistic AS VS Percent 0.84 %       Pertinent labs & imaging results that were available during my care of the patient were reviewed by me and considered in my medical decision making.  Assessment & Plan:  Breion was seen today for medical management of chronic issues.  Diagnoses and all orders for this visit:  Type 2 diabetes mellitus with other specified complication, without long-term current use of insulin (HCC) Diabetes mellitus with oral medication (HCC) A1C 6.6 in office today. Continue the great work. Continue current medications. Diet and exercise encouraged.  -     CMP14+EGFR -     Bayer DCA  Hb A1c Waived -     Microalbumin / creatinine urine ratio -     lisinopril (ZESTRIL) 2.5 MG tablet; Take 1 tablet (2.5 mg total) by mouth daily.  Hypertension associated with type 2 diabetes mellitus (HCC) BP well controlled. Changes were not made in regimen today. Goal BP is 130/80. Pt aware to report any persistent high or low readings. DASH diet and  exercise encouraged. Exercise at least 150 minutes per week and increase as tolerated. Goal BMI > 25. Stress management encouraged. Avoid nicotine and tobacco product use. Avoid excessive alcohol and NSAID's. Avoid more than 2000 mg of sodium daily. Medications as prescribed. Follow up as scheduled.  -     CMP14+EGFR -     Microalbumin / creatinine urine ratio -     lisinopril (ZESTRIL) 2.5 MG tablet; Take 1 tablet (2.5 mg total) by mouth daily.  CKD stage 3 due to type 2 diabetes mellitus (HCC) Followed by nephrology on a regular basis. Will repeat labs today.  -     CMP14+EGFR -     Microalbumin / creatinine urine ratio -     lisinopril (ZESTRIL) 2.5 MG tablet; Take 1 tablet (2.5 mg total) by mouth daily.  Coronary artery disease of native artery of native heart with stable angina pectoris (HCC) No anginal symptoms. Followed by cardiology on a regular basis. Continue GDMT.   Chronic systolic heart failure (HCC) Minimal lower extremity edema today, not pitting. Followed by cardiology on a regular basis. Continue current medications.   Hyperlipidemia associated with type 2 diabetes mellitus (HCC) Diet encouraged - increase intake of fresh fruits and vegetables, increase intake of lean proteins. Bake, broil, or grill foods. Avoid fried, greasy, and fatty foods. Avoid fast foods. Increase intake of fiber-rich whole grains. Exercise encouraged - at least 150 minutes per week and advance as tolerated.  Goal BMI < 25. Continue medications as prescribed. Follow up in 3-6 months as discussed.   Encounter for immunization -     Flu Vaccine Trivalent  High Dose (Fluad)     Continue all other maintenance medications.  Follow up plan: Return in about 3 months (around 01/26/2023) for DM, all labs.   Continue healthy lifestyle choices, including diet (rich in fruits, vegetables, and lean proteins, and low in salt and simple carbohydrates) and exercise (at least 30 minutes of moderate physical activity daily).  Educational handout given for DM  The above assessment and management plan was discussed with the patient. The patient verbalized understanding of and has agreed to the management plan. Patient is aware to call the clinic if they develop any new symptoms or if symptoms persist or worsen. Patient is aware when to return to the clinic for a follow-up visit. Patient educated on when it is appropriate to go to the emergency department.   Kari Baars, FNP-C [facility] [phone]

## 2022-10-28 LAB — CMP14+EGFR
ALT: 16 IU/L (ref 0–44)
AST: 19 IU/L (ref 0–40)
Albumin: 4.3 g/dL (ref 3.7–4.7)
Alkaline Phosphatase: 73 IU/L (ref 44–121)
BUN/Creatinine Ratio: 15 (ref 10–24)
BUN: 21 mg/dL (ref 8–27)
Bilirubin Total: 0.8 mg/dL (ref 0.0–1.2)
CO2: 23 mmol/L (ref 20–29)
Calcium: 9.5 mg/dL (ref 8.6–10.2)
Chloride: 104 mmol/L (ref 96–106)
Creatinine, Ser: 1.4 mg/dL — ABNORMAL HIGH (ref 0.76–1.27)
Globulin, Total: 2.3 g/dL (ref 1.5–4.5)
Glucose: 79 mg/dL (ref 70–99)
Potassium: 4.3 mmol/L (ref 3.5–5.2)
Sodium: 144 mmol/L (ref 134–144)
Total Protein: 6.6 g/dL (ref 6.0–8.5)
eGFR: 49 mL/min/{1.73_m2} — ABNORMAL LOW (ref >59)

## 2022-10-28 LAB — MICROALBUMIN / CREATININE URINE RATIO
Creatinine, Urine: 65.5 mg/dL
Microalb/Creat Ratio: 162 mg/g creat — ABNORMAL HIGH (ref 0–29)
Microalbumin, Urine: 106.3 ug/mL

## 2022-11-02 ENCOUNTER — Telehealth: Payer: Self-pay | Admitting: Family Medicine

## 2022-11-02 ENCOUNTER — Other Ambulatory Visit: Payer: Self-pay | Admitting: Family Medicine

## 2022-11-02 DIAGNOSIS — I5022 Chronic systolic (congestive) heart failure: Secondary | ICD-10-CM

## 2022-11-02 MED ORDER — FUROSEMIDE 20 MG PO TABS: 20.0000 mg | ORAL_TABLET | Freq: Every day | ORAL | 1 refills | Status: DC | PRN

## 2022-11-02 NOTE — Telephone Encounter (Signed)
Please let the patient know that I sent their prescription to their pharmacy. Thanks, WS 

## 2022-11-02 NOTE — Telephone Encounter (Signed)
  Prescription Request  11/02/2022  What is the name of the medication or equipment? FUROSEMIDE  Have you contacted your pharmacy to request a refill? YES  Which pharmacy would you like this sent to? [facility]  Pt says he needs PCP to send in new Rx for this because he has ran out too soon because the specialist he sees advised him to take more than 1 tablet a day if needed for fluid issue. So pt has ran out of the medicine too soon.    Patient notified that their request is being sent to the clinical staff for review and that they should receive a response within 2 business days.

## 2022-11-05 DIAGNOSIS — E119 Type 2 diabetes mellitus without complications: Secondary | ICD-10-CM | POA: Diagnosis not present

## 2022-11-18 ENCOUNTER — Ambulatory Visit: Payer: Medicare Other | Attending: Cardiovascular Disease

## 2022-11-18 DIAGNOSIS — Z95810 Presence of automatic (implantable) cardiac defibrillator: Secondary | ICD-10-CM

## 2022-11-18 DIAGNOSIS — I5022 Chronic systolic (congestive) heart failure: Secondary | ICD-10-CM

## 2022-11-20 DIAGNOSIS — H524 Presbyopia: Secondary | ICD-10-CM | POA: Diagnosis not present

## 2022-11-20 NOTE — Progress Notes (Signed)
EPIC Encounter for ICM Monitoring  Patient Name: Omar Williams is a 87 y.o. male Date: 11/20/2022 Primary Care Physican: Sonny Masters, FNP Primary Cardiologist:  Wyline Mood Electrophysiologist: Mealor Nephrologist: [facility] Bi-V Pacing: 98.9% 10/22/2021 Weight: 161-162 lbs 04/27/2022 Weight: 160 lbs 06/05/2022 Office Weight: 165 lbs 08/11/2022 Weight: 162 lbs  09/15/2022 Weight: 162 lbs   Time in AT/AF  0.0 hr/day (0.0%)   Spoke with patient and heart failure questions reviewed.  Transmission results reviewed.  Pt asymptomatic for fluid accumulation.  Reports feeling well at this time and voices no complaints.  Taking device monitor when he leaves for [facility] 11/20.     Optivol Thoracic impedance suggesting intermittent days with possible fluid accumulation within the last mont.    Prescribed:  Furosemide 20 mg take 1 tablet as needed for swelling.  Pt takes 1 tablet daily instead of PRN   Labs: 10/27/2022 Creatinine 1.40, BUN 21, Potassium 4.3, Sodium 144, GFR 49 07/21/2022 Creatinine 1.44, BUN 25, Potassium 5.3, Sodium 140, GFR 48 05/25/2022 Creatinine 1.53, BUN 27, Potassium 4.8, Sodium 141, GFR 44 04/29/2022 Creatinine 1.50, BUN 27, Potassium 3.8, Sodium 143, GFR 46 A complete set of results can be found in Results Review.   Recommendations:  No changes and encouraged to call if experiencing any fluid symptoms.   Follow-up plan: ICM clinic phone appointment on 12/21/2022.   91 day device clinic remote transmission 01/06/2023.       EP/Cardiology Office Visits:  06/04/2023 with Dr Nelly Laurence.  12/02/2022 with Dr Wyline Mood.        Copy of ICM check sent to Dr. Nelly Laurence   3 month ICM trend: 11/18/2022.    12-14 Month ICM trend:     Karie Soda, RN 11/20/2022 12:08 PM

## 2022-11-30 ENCOUNTER — Ambulatory Visit: Payer: Medicare Other | Admitting: Cardiology

## 2022-12-02 ENCOUNTER — Encounter: Payer: Self-pay | Admitting: Cardiology

## 2022-12-02 ENCOUNTER — Ambulatory Visit: Payer: Medicare Other | Attending: Cardiology | Admitting: Cardiology

## 2022-12-02 VITALS — BP 112/60 | HR 61 | Ht 69.0 in | Wt 164.8 lb

## 2022-12-02 DIAGNOSIS — I5032 Chronic diastolic (congestive) heart failure: Secondary | ICD-10-CM | POA: Diagnosis not present

## 2022-12-02 DIAGNOSIS — E782 Mixed hyperlipidemia: Secondary | ICD-10-CM

## 2022-12-02 DIAGNOSIS — I2510 Atherosclerotic heart disease of native coronary artery without angina pectoris: Secondary | ICD-10-CM

## 2022-12-02 DIAGNOSIS — I10 Essential (primary) hypertension: Secondary | ICD-10-CM | POA: Diagnosis not present

## 2022-12-02 NOTE — Patient Instructions (Addendum)

## 2022-12-02 NOTE — Progress Notes (Signed)
Clinical Summary Mr. Raska is a 87 y.o.male seen today for follow up of the following medical problems.    1. ICM/Chronic systolic HF/CAD - history of LAD disease without target for revasc   - has BiV AICD, 04/2021 normal device check - 06/2017 echo LVEF 40-45%.  - medical therapy has been limited by low bp's. Has had some prior issues with hyperkalemia in the past     - had been on coreg 6.25mg  bid and losartan 25mg  daily at 07/2020 visit. Somewhere betwee June and July meds changed and now just on lisinopril 2.5mg  daily. Im not clear on this history.  Jan 2023 echo: LVEF 50%, grade I dd - he is on lisinopril, jardiance  - no SOB/DOE, no recent edema. No chest pains.  - compliant with meds 10/2022 normal device check      2. HTN - he is compliant with meds     3. Hyperlipidemia  04/2021 TC 133 TG 78 HDL 57 LDL 61 - 10/2021 TC 409 TG 81 HDL 63 LDL 84 01/2022 TC 151 TG 100 HDL 61 LDL 72   4. CKD - followed by Dr Wolfgang Phoenix  5. Afib -isoalted episode noted on device check without recurrence, has not been committed to anticoag at this time. Followed by EP Past Medical History:  Diagnosis Date   Acid reflux    Blood transfusion without reported diagnosis    Cardiac arrest (HCC)    a. occuring in 12/2016. LAD disease but no targets for CABG or PCI. Underwent ICD placement as EF 15-20%.    CHF (congestive heart failure) (HCC)    Coronary artery disease    Diabetes mellitus without complication (HCC)    Diabetic nephropathy (HCC)    Hyperlipidemia    Hypertension    Myocardial infarction [facility])    Renal disorder      No Known Allergies   Current Outpatient Medications  Medication Sig Dispense Refill   aspirin 81 MG chewable tablet Chew 81 mg by mouth daily.     Bee Pollen 550 MG CAPS Take 2 capsules by mouth 2 (two) times daily.     chlorhexidine (PERIDEX) 0.12 % solution as needed.     Cholecalciferol (VITAMIN D3) 125 MCG (5000 UT) TABS Take 1 tablet by mouth  daily.      CINNAMON PO Take 2,000 mg by mouth in the morning and at bedtime.     cyanocobalamin (VITAMIN B12) 1000 MCG tablet Take 1,000 mcg by mouth 2 (two) times daily.     dapagliflozin propanediol (FARXIGA) 10 MG TABS tablet Take 1 tablet (10 mg total) by mouth daily before breakfast. 90 tablet 5   docusate sodium (COLACE) 50 MG capsule Take 100 mg by mouth 2 (two) times daily.     ezetimibe (ZETIA) 10 MG tablet TAKE 1 TABLET BY MOUTH DAILY 90 tablet 3   finasteride (PROSCAR) 5 MG tablet Take 1 tablet (5 mg total) by mouth daily. 90 tablet 3   furosemide (LASIX) 20 MG tablet Take 1 tablet (20 mg total) by mouth daily as needed for edema. 90 tablet 1   glimepiride (AMARYL) 4 MG tablet Take 1 tablet (4 mg total) by mouth in the morning and at bedtime. 180 tablet 0   glucose blood (ONETOUCH ULTRA) test strip Test BS daily Dx E11.9 100 strip 3   lisinopril (ZESTRIL) 2.5 MG tablet Take 1 tablet (2.5 mg total) by mouth daily. 90 tablet 1   nitroGLYCERIN (NITROSTAT) 0.4  MG SL tablet Place 1 tablet (0.4 mg total) under the tongue every 5 (five) minutes x 3 doses as needed for chest pain. 25 tablet 3   Omega-3 Fatty Acids (FISH OIL) 1200 MG CAPS Take 1 capsule by mouth 2 (two) times daily.      oxybutynin (DITROPAN) 5 MG tablet Take 0.5 tablets (2.5 mg total) by mouth 3 (three) times daily. 135 tablet 3   pantoprazole (PROTONIX) 40 MG tablet Take 1 tablet (40 mg total) by mouth daily. 90 tablet 1   rosuvastatin (CRESTOR) 40 MG tablet TAKE 1 TABLET BY MOUTH DAILY 90 tablet 2   tamsulosin (FLOMAX) 0.4 MG CAPS capsule Take 1 capsule (0.4 mg total) by mouth every morning. 90 capsule 3   TURMERIC PO Take 1,000 mg by mouth 2 (two) times daily.     vitamin C (ASCORBIC ACID) 500 MG tablet Take 1,000 mg by mouth 2 (two) times daily.     No current facility-administered medications for this visit.     Past Surgical History:  Procedure Laterality Date   BIV ICD INSERTION CRT-D N/A 01/01/2017   Procedure:  BIV ICD INSERTION CRT-D;  Surgeon: Regan Lemming, MD;  Location: [facility] INVASIVE CV LAB;  Service: Cardiovascular;  Laterality: N/A;     No Known Allergies    Family History  Problem Relation Age of Onset   Hypertension Mother    Hyperlipidemia Mother    CAD Sister    Diabetes Sister    Hyperlipidemia Sister    Hypertension Sister    CAD Brother    Diabetes Brother    Hypertension Brother    Hyperlipidemia Brother    CAD Brother    Diabetes Brother    Hypertension Brother    Hyperlipidemia Brother    Dementia Brother    Thyroid disease Daughter    Hypertension Daughter    Diabetes Daughter    Hyperlipidemia Daughter    Fibromyalgia Daughter    Heart disease Daughter        stents   Multiple sclerosis Daughter    Thyroid disease Daughter      Social History Mr. Escue reports that he quit smoking about 64 years ago. His smoking use included cigarettes. He has never been exposed to tobacco smoke. He has never used smokeless tobacco. Mr. Mallia reports no history of alcohol use.   Review of Systems CONSTITUTIONAL: No weight loss, fever, chills, weakness or fatigue.  HEENT: Eyes: No visual loss, blurred vision, double vision or yellow sclerae.No hearing loss, sneezing, congestion, runny nose or sore throat.  SKIN: No rash or itching.  CARDIOVASCULAR: per hpi RESPIRATORY: No shortness of breath, cough or sputum.  GASTROINTESTINAL: No anorexia, nausea, vomiting or diarrhea. No abdominal pain or blood.  GENITOURINARY: No burning on urination, no polyuria NEUROLOGICAL: No headache, dizziness, syncope, paralysis, ataxia, numbness or tingling in the extremities. No change in bowel or bladder control.  MUSCULOSKELETAL: No muscle, back pain, joint pain or stiffness.  LYMPHATICS: No enlarged nodes. No history of splenectomy.  PSYCHIATRIC: No history of depression or anxiety.  ENDOCRINOLOGIC: No reports of sweating, cold or heat intolerance. No polyuria or polydipsia.   Marland Kitchen   Physical Examination Today's Vitals   12/02/22 0846  BP: 112/60  Pulse: 61  SpO2: 98%  Weight: 164 lb 12.8 oz (74.8 kg)  Height: 5\' 9"  (1.753 m)   Body mass index is 24.34 kg/m.  Gen: resting comfortably, no acute distress HEENT: no scleral icterus, pupils equal round and reactive,  no palptable cervical adenopathy,  CV: RRR, no m/rg, no jvd Resp: Clear to auscultation bilaterally GI: abdomen is soft, non-tender, non-distended, normal bowel sounds, no hepatosplenomegaly MSK: extremities are warm, no edema.  Skin: warm, no rash Neuro:  no focal deficits Psych: appropriate affect   Diagnostic Studies  Echo 06/09/17:   Study Conclusions   - Left ventricle: The cavity size was normal. Wall thickness was   normal. Systolic function was mildly to moderately reduced. The   estimated ejection fraction was in the range of 40% to 45%.   Diffuse hypokinesis. Doppler parameters are consistent with   abnormal left ventricular relaxation (grade 1 diastolic   dysfunction). - Aortic valve: Poorly visualized. Probably trileaflet. There was   mild regurgitation. - Mitral valve: There was trivial regurgitation. - Left atrium: The atrium was mildly dilated. - Right ventricle: Pacer wire or catheter noted in right ventricle. - Right atrium: Central venous pressure (est): 3 mm Hg. - Atrial septum: No defect or patent foramen ovale was identified. - Tricuspid valve: There was trivial regurgitation. - Pulmonary arteries: PA peak pressure: 13 mm Hg (S). - Pericardium, extracardiac: There was no pericardial effusion.   Impressions:   - Images are limited, but LVEF has improved in comparison to the   previous study in November 2018.   06/2017 echo Study Conclusions   - Left ventricle: The cavity size was normal. Wall thickness was    normal. Systolic function was mildly to moderately reduced. The    estimated ejection fraction was in the range of 40% to 45%.    Diffuse hypokinesis.  Doppler parameters are consistent with    abnormal left ventricular relaxation (grade 1 diastolic    dysfunction).  - Aortic valve: Poorly visualized. Probably trileaflet. There was    mild regurgitation.  - Mitral valve: There was trivial regurgitation.  - Left atrium: The atrium was mildly dilated.  - Right ventricle: Pacer wire or catheter noted in right ventricle.  - Right atrium: Central venous pressure (est): 3 mm Hg.  - Atrial septum: No defect or patent foramen ovale was identified.  - Tricuspid valve: There was trivial regurgitation.  - Pulmonary arteries: PA peak pressure: 13 mm Hg (S).  - Pericardium, extracardiac: There was no pericardial effusion.   Jan 2023 echo 1. Left ventricular ejection fraction, by estimation, is 50%. The left  ventricle has low normal function. The left ventricle has no regional wall  motion abnormalities. There is moderate left ventricular hypertrophy. Left  ventricular diastolic parameters   are consistent with Grade I diastolic dysfunction (impaired relaxation).   2. Right ventricular systolic function is normal. The right ventricular  size is normal. There is normal pulmonary artery systolic pressure.   3. Left atrial size was moderately dilated.   4. The mitral valve is normal in structure. No evidence of mitral valve  regurgitation. No evidence of mitral stenosis.   5. The tricuspid valve is abnormal.   6. The aortic valve was not well visualized. Aortic valve regurgitation  is mild.   7. The inferior vena cava is normal in size with greater than 50%  respiratory variability, suggesting right atrial pressure of 3 mmHg.          Assessment and Plan   1. CAD - no recent symptoms, continue current meds  2.HFimpEF - most recent echo shows LVEF has normalized at 50% -  At some point beta blocker was stopped by another provider, I am unclear on history. - historically  medical therapy limited by low bp's, renal dysfunction - no recent  symptoms, continue current meds   3. Hyperlipidemia -f/u upcoming pcp labs, continue current meds   4. HTN - his bp is at goal, continue current meds     Antoine Poche, M.D.

## 2022-12-03 LAB — LAB REPORT - SCANNED: Microalb Creat Ratio: 174

## 2022-12-21 ENCOUNTER — Ambulatory Visit: Payer: Medicare Other | Attending: Cardiovascular Disease

## 2022-12-21 DIAGNOSIS — Z95810 Presence of automatic (implantable) cardiac defibrillator: Secondary | ICD-10-CM | POA: Diagnosis not present

## 2022-12-21 DIAGNOSIS — I5022 Chronic systolic (congestive) heart failure: Secondary | ICD-10-CM

## 2022-12-22 NOTE — Progress Notes (Signed)
EPIC Encounter for ICM Monitoring  Patient Name: Omar Williams is a 87 y.o. male Date: 12/22/2022 Primary Care Physican: Sonny Masters, FNP Primary Cardiologist:  Wyline Mood Electrophysiologist: Mealor Nephrologist: [facility] Bi-V Pacing: 98.7% 10/22/2021 Weight: 161-162 lbs 04/27/2022 Weight: 160 lbs 06/05/2022 Office Weight: 165 lbs 08/11/2022 Weight: 162 lbs  09/15/2022 Weight: 162 lbs   Since 10-Aug-2022 Time in AT/AF  0.0 hr/day (0.0%)   Spoke with patient and heart failure questions reviewed.  Transmission results reviewed.  Pt asymptomatic for fluid accumulation.  Reports feeling well at this time and voices no complaints.  Traveling to [place] tomorrow, 11/20.   Optivol Thoracic impedance suggesting normal fluid levels.    Prescribed:  Furosemide 20 mg take 1 tablet as needed for swelling.  Pt takes 1 tablet daily instead of PRN   Labs: 07/21/2022 Creatinine 1.44, BUN 25, Potassium 5.3, Sodium 140, GFR 48 05/25/2022 Creatinine 1.53, BUN 27, Potassium 4.8, Sodium 141, GFR 44 04/29/2022 Creatinine 1.50, BUN 27, Potassium 3.8, Sodium 143, GFR 46 A complete set of results can be found in Results Review.   Recommendations:  No changes and encouraged to call if experiencing any fluid symptoms.   Follow-up plan: ICM clinic phone appointment on 01/25/2023.   91 day device clinic remote transmission 01/06/2023.       EP/Cardiology Office Visits:  06/04/2023 with Dr Nelly Laurence.      Copy of ICM check sent to Dr. Nelly Laurence.  3 month ICM trend: 12/21/2022.    12-14 Month ICM trend:     Karie Soda, RN 12/22/2022 1:40 PM

## 2023-01-06 ENCOUNTER — Ambulatory Visit (INDEPENDENT_AMBULATORY_CARE_PROVIDER_SITE_OTHER): Payer: Medicare Other

## 2023-01-06 DIAGNOSIS — I5022 Chronic systolic (congestive) heart failure: Secondary | ICD-10-CM | POA: Diagnosis not present

## 2023-01-06 LAB — CUP PACEART REMOTE DEVICE CHECK
Battery Remaining Longevity: 20 mo
Battery Voltage: 2.91 V
Brady Statistic AP VP Percent: 53.69 %
Brady Statistic AP VS Percent: 0.03 %
Brady Statistic AS VP Percent: 45.03 %
Brady Statistic AS VS Percent: 1.25 %
Brady Statistic RA Percent Paced: 53.61 %
Brady Statistic RV Percent Paced: 95.9 %
Date Time Interrogation Session: 20241204012403
HighPow Impedance: 79 Ohm
Implantable Lead Connection Status: 753985
Implantable Lead Connection Status: 753985
Implantable Lead Connection Status: 753985
Implantable Lead Implant Date: 20181130
Implantable Lead Implant Date: 20181130
Implantable Lead Implant Date: 20181130
Implantable Lead Location: 753858
Implantable Lead Location: 753859
Implantable Lead Location: 753860
Implantable Lead Model: 4598
Implantable Lead Model: 5076
Implantable Lead Model: 6935
Implantable Pulse Generator Implant Date: 20181130
Lead Channel Impedance Value: 133 Ohm
Lead Channel Impedance Value: 141.867
Lead Channel Impedance Value: 141.867
Lead Channel Impedance Value: 141.867
Lead Channel Impedance Value: 152 Ohm
Lead Channel Impedance Value: 266 Ohm
Lead Channel Impedance Value: 266 Ohm
Lead Channel Impedance Value: 304 Ohm
Lead Channel Impedance Value: 304 Ohm
Lead Channel Impedance Value: 342 Ohm
Lead Channel Impedance Value: 380 Ohm
Lead Channel Impedance Value: 456 Ohm
Lead Channel Impedance Value: 494 Ohm
Lead Channel Impedance Value: 494 Ohm
Lead Channel Impedance Value: 494 Ohm
Lead Channel Impedance Value: 513 Ohm
Lead Channel Impedance Value: 589 Ohm
Lead Channel Impedance Value: 665 Ohm
Lead Channel Pacing Threshold Amplitude: 0.375 V
Lead Channel Pacing Threshold Amplitude: 0.625 V
Lead Channel Pacing Threshold Amplitude: 1.5 V
Lead Channel Pacing Threshold Pulse Width: 0.4 ms
Lead Channel Pacing Threshold Pulse Width: 0.4 ms
Lead Channel Pacing Threshold Pulse Width: 0.4 ms
Lead Channel Sensing Intrinsic Amplitude: 2.75 mV
Lead Channel Sensing Intrinsic Amplitude: 2.75 mV
Lead Channel Sensing Intrinsic Amplitude: 9.125 mV
Lead Channel Sensing Intrinsic Amplitude: 9.125 mV
Lead Channel Setting Pacing Amplitude: 2 V
Lead Channel Setting Pacing Amplitude: 2 V
Lead Channel Setting Pacing Amplitude: 2.5 V
Lead Channel Setting Pacing Pulse Width: 0.4 ms
Lead Channel Setting Pacing Pulse Width: 0.4 ms
Lead Channel Setting Sensing Sensitivity: 0.3 mV
Zone Setting Status: 755011
Zone Setting Status: 755011

## 2023-01-11 ENCOUNTER — Telehealth: Payer: Self-pay | Admitting: Urology

## 2023-01-11 ENCOUNTER — Other Ambulatory Visit: Payer: Self-pay

## 2023-01-11 DIAGNOSIS — N138 Other obstructive and reflux uropathy: Secondary | ICD-10-CM

## 2023-01-11 MED ORDER — OXYBUTYNIN CHLORIDE 5 MG PO TABS
2.5000 mg | ORAL_TABLET | Freq: Three times a day (TID) | ORAL | 3 refills | Status: AC
Start: 2023-01-11 — End: ?

## 2023-01-11 NOTE — Telephone Encounter (Signed)
Needs a refill on a prescription he receives in the mail Oxybutynin Chloride 5mg .

## 2023-01-11 NOTE — Telephone Encounter (Signed)
Rx refilled Pt notified via phone call

## 2023-01-18 DIAGNOSIS — N1831 Chronic kidney disease, stage 3a: Secondary | ICD-10-CM | POA: Diagnosis not present

## 2023-01-18 DIAGNOSIS — N189 Chronic kidney disease, unspecified: Secondary | ICD-10-CM | POA: Diagnosis not present

## 2023-01-18 DIAGNOSIS — E1169 Type 2 diabetes mellitus with other specified complication: Secondary | ICD-10-CM | POA: Diagnosis not present

## 2023-01-18 DIAGNOSIS — E1129 Type 2 diabetes mellitus with other diabetic kidney complication: Secondary | ICD-10-CM | POA: Diagnosis not present

## 2023-01-18 DIAGNOSIS — I5032 Chronic diastolic (congestive) heart failure: Secondary | ICD-10-CM | POA: Diagnosis not present

## 2023-01-25 ENCOUNTER — Ambulatory Visit: Payer: Medicare Other | Attending: Cardiovascular Disease

## 2023-01-25 DIAGNOSIS — I5022 Chronic systolic (congestive) heart failure: Secondary | ICD-10-CM | POA: Diagnosis not present

## 2023-01-25 DIAGNOSIS — E1122 Type 2 diabetes mellitus with diabetic chronic kidney disease: Secondary | ICD-10-CM | POA: Diagnosis not present

## 2023-01-25 DIAGNOSIS — Z95810 Presence of automatic (implantable) cardiac defibrillator: Secondary | ICD-10-CM

## 2023-01-25 DIAGNOSIS — E1129 Type 2 diabetes mellitus with other diabetic kidney complication: Secondary | ICD-10-CM | POA: Diagnosis not present

## 2023-01-25 DIAGNOSIS — R809 Proteinuria, unspecified: Secondary | ICD-10-CM | POA: Diagnosis not present

## 2023-01-25 DIAGNOSIS — I5032 Chronic diastolic (congestive) heart failure: Secondary | ICD-10-CM | POA: Diagnosis not present

## 2023-01-28 NOTE — Progress Notes (Signed)
EPIC Encounter for ICM Monitoring  Patient Name: Omar Williams is a 87 y.o. male Date: 01/28/2023 Primary Care Physican: Sonny Masters, FNP Primary Cardiologist:  Wyline Mood Electrophysiologist: Mealor Nephrologist: [facility] Bi-V Pacing: 98.6% 10/22/2021 Weight: 161-162 lbs 04/27/2022 Weight: 160 lbs 06/05/2022 Office Weight: 165 lbs 08/11/2022 Weight: 162 lbs  09/15/2022 Weight: 162 lbs   Since 06-Jan-2023 Time in AT/AF  0.0 hr/day (0.0%)   Spoke with patient and heart failure questions reviewed.  Transmission results reviewed.  Pt asymptomatic for fluid accumulation.  Reports feeling well at this time and voices no complaints.     Optivol Thoracic impedance suggesting normal fluid levels.    Prescribed:  Furosemide 20 mg take 1 tablet as needed for swelling.  Pt takes 1 tablet daily instead of PRN   Labs: 10/27/2022 Creatinine 1.40, BUN 21, Potassium 4.3, Sodium 144, GFR 49 07/21/2022 Creatinine 1.44, BUN 25, Potassium 5.3, Sodium 140, GFR 48 05/25/2022 Creatinine 1.53, BUN 27, Potassium 4.8, Sodium 141, GFR 44 04/29/2022 Creatinine 1.50, BUN 27, Potassium 3.8, Sodium 143, GFR 46 A complete set of results can be found in Results Review.   Recommendations:  No changes and encouraged to call if experiencing any fluid symptoms.   Follow-up plan: ICM clinic phone appointment on 03/01/2023.   91 day device clinic remote transmission 04/07/2023.       EP/Cardiology Office Visits:  06/04/2023 with Dr Nelly Laurence.      Copy of ICM check sent to Dr. Nelly Laurence.   3 month ICM trend: 01/25/2023.    12-14 Month ICM trend:     Karie Soda, RN 01/28/2023 9:34 AM

## 2023-01-29 ENCOUNTER — Encounter: Payer: Self-pay | Admitting: Family Medicine

## 2023-01-29 ENCOUNTER — Ambulatory Visit (INDEPENDENT_AMBULATORY_CARE_PROVIDER_SITE_OTHER): Payer: Medicare Other | Admitting: Family Medicine

## 2023-01-29 VITALS — BP 117/66 | HR 60 | Temp 97.0°F | Ht 69.0 in | Wt 168.0 lb

## 2023-01-29 DIAGNOSIS — N183 Chronic kidney disease, stage 3 unspecified: Secondary | ICD-10-CM

## 2023-01-29 DIAGNOSIS — E1169 Type 2 diabetes mellitus with other specified complication: Secondary | ICD-10-CM | POA: Diagnosis not present

## 2023-01-29 DIAGNOSIS — E1159 Type 2 diabetes mellitus with other circulatory complications: Secondary | ICD-10-CM | POA: Diagnosis not present

## 2023-01-29 DIAGNOSIS — Z7984 Long term (current) use of oral hypoglycemic drugs: Secondary | ICD-10-CM

## 2023-01-29 DIAGNOSIS — E1122 Type 2 diabetes mellitus with diabetic chronic kidney disease: Secondary | ICD-10-CM | POA: Diagnosis not present

## 2023-01-29 DIAGNOSIS — E785 Hyperlipidemia, unspecified: Secondary | ICD-10-CM

## 2023-01-29 DIAGNOSIS — N401 Enlarged prostate with lower urinary tract symptoms: Secondary | ICD-10-CM

## 2023-01-29 DIAGNOSIS — I5022 Chronic systolic (congestive) heart failure: Secondary | ICD-10-CM

## 2023-01-29 DIAGNOSIS — N138 Other obstructive and reflux uropathy: Secondary | ICD-10-CM

## 2023-01-29 LAB — BAYER DCA HB A1C WAIVED: HB A1C (BAYER DCA - WAIVED): 6.6 % — ABNORMAL HIGH (ref 4.8–5.6)

## 2023-01-29 MED ORDER — GLIMEPIRIDE 4 MG PO TABS: 4.0000 mg | ORAL_TABLET | Freq: Two times a day (BID) | ORAL | 3 refills | Status: DC

## 2023-01-29 MED ORDER — DAPAGLIFLOZIN PROPANEDIOL 10 MG PO TABS: 10.0000 mg | ORAL_TABLET | Freq: Every day | ORAL | 5 refills | Status: DC

## 2023-01-29 NOTE — Progress Notes (Signed)
Subjective:  Patient ID: Omar Williams, male    DOB: October 09, 1935, 87 y.o.   MRN: [id number]  Patient Care Team: Sonny Masters, FNP as PCP - General (Family Medicine) Wyline Mood, Dorothe Pea, MD as PCP - Cardiology (Cardiology) Mealor, Roberts Gaudy, MD as PCP - Electrophysiology (Cardiology) Genelle Gather, OD (Optometry) Netta Neat., NP (Inactive) as Nurse Practitioner (Cardiology) Randa Lynn, MD as Consulting Physician (Nephrology) Bjorn Pippin, MD as Attending Physician (Urology) Franky Macho, MD as Consulting Physician (General Surgery) Danella Maiers, [facility] as Pharmacist (Family Medicine)   Chief Complaint:  Diabetes (3 month follow up )   HPI: Omar Williams is a 87 y.o. male presenting on 01/29/2023 for Diabetes (3 month follow up )   Discussed the use of AI scribe software for clinical note transcription with the patient, who gave verbal consent to proceed.  History of Present Illness   Reports his diabetes is well controlled. He denies any increased hunger, thirst, or urination, and has not experienced significant weight gain. He is a former smoker, having quit over sixty years ago.  The patient's diabetes is managed with Amaryl and Farxiga, and he reports no side effects from these medications. He also takes Lisinopril for hypertension and Lasix for heart failure. He has been experiencing nocturia approximately four times a night, which he attributes to the Lasix. He takes this medication in the evening to avoid daytime urinary frequency.  He has regular follow-ups with his urologist, with the next appointment scheduled in a few months. He also has regular appointments with his cardiologist, with the next one due soon. He denies any chest pain, leg swelling, increased fatigue, or shortness of breath related to his heart failure.  The patient also uses Senokot as needed for constipation, but reports that he does not need to use it often. He has regular appointments with  his ophthalmologist and reports a recent change in his prescription, but denies any retinopathy or changes in vision. He also denies any nausea, vomiting, or night sweats.          Relevant past medical, surgical, family, and social history reviewed and updated as indicated.  Allergies and medications reviewed and updated. Data reviewed: Chart in Epic.   Past Medical History:  Diagnosis Date   Acid reflux    Blood transfusion without reported diagnosis    Cardiac arrest (HCC)    a. occuring in 12/2016. LAD disease but no targets for CABG or PCI. Underwent ICD placement as EF 15-20%.    CHF (congestive heart failure) (HCC)    Coronary artery disease    Diabetes mellitus without complication (HCC)    Diabetic nephropathy (HCC)    Hyperlipidemia    Hypertension    Myocardial infarction [facility])    Renal disorder     Past Surgical History:  Procedure Laterality Date   BIV ICD INSERTION CRT-D N/A 01/01/2017   Procedure: BIV ICD INSERTION CRT-D;  Surgeon: Regan Lemming, MD;  Location: [facility] INVASIVE CV LAB;  Service: Cardiovascular;  Laterality: N/A;    Social History   Socioeconomic History   Marital status: Married    Spouse name: johnna   Number of children: 3   Years of education: 12   Highest education level: Some college, no degree  Occupational History   Occupation: Health and safety inspector   retired   Occupation: Education officer, environmental  Tobacco Use   Smoking status: Former    Current packs/day: 0.00    Types: Cigarettes  Quit date: 80    Years since quitting: 65.0    Passive exposure: Never   Smokeless tobacco: Never   Tobacco comments:    2-3 years as a teenager  Vaping Use   Vaping status: Never Used  Substance and Sexual Activity   Alcohol use: No   Drug use: No   Sexual activity: Not Currently  Other Topics Concern   Not on file  Social History Narrative   Lives with with wife [name]   Married 60 years   Daughter [name] stays frequently   Granddaughter [name]  near by   Social Drivers of [employer]: Low Risk  (06/22/2022)   Overall Financial Resource Strain (CARDIA)    Difficulty of Paying Living Expenses: Not hard at all  Food Insecurity: No Food Insecurity (06/22/2022)   Hunger Vital Sign    Worried About Running Out of Food in the Last Year: Never true    Ran Out of Food in the Last Year: Never true  Transportation Needs: No Transportation Needs (06/22/2022)   PRAPARE - Administrator, Civil Service (Medical): No    Lack of Transportation (Non-Medical): No  Physical Activity: Sufficiently Active (06/22/2022)   Exercise Vital Sign    Days of Exercise per Week: 7 days    Minutes of Exercise per Session: 30 min  Stress: No Stress Concern Present (06/22/2022)   [employer] of Occupational Health - Occupational Stress Questionnaire    Feeling of Stress : Not at all  Social Connections: Socially Integrated (06/22/2022)   Social Connection and Isolation Panel [NHANES]    Frequency of Communication with Friends and Family: More than three times a week    Frequency of Social Gatherings with Friends and Family: More than three times a week    Attends Religious Services: More than 4 times per year    Active Member of [employer] or Organizations: Yes    Attends Engineer, structural: More than 4 times per year    Marital Status: Married  Catering manager Violence: Not At Risk (06/22/2022)   Humiliation, Afraid, Rape, and Kick questionnaire    Fear of Current or Ex-Partner: No    Emotionally Abused: No    Physically Abused: No    Sexually Abused: No    Outpatient Encounter Medications as of 01/29/2023  Medication Sig   aspirin 81 MG chewable tablet Chew 81 mg by mouth daily.   Bee Pollen 550 MG CAPS Take 2 capsules by mouth 2 (two) times daily.   chlorhexidine (PERIDEX) 0.12 % solution as needed.   Cholecalciferol (VITAMIN D3) 125 MCG (5000 UT) TABS Take 1 tablet by mouth daily.    CINNAMON PO Take  2,000 mg by mouth in the morning and at bedtime.   cyanocobalamin (VITAMIN B12) 1000 MCG tablet Take 1,000 mcg by mouth 2 (two) times daily.   ezetimibe (ZETIA) 10 MG tablet TAKE 1 TABLET BY MOUTH DAILY   finasteride (PROSCAR) 5 MG tablet Take 1 tablet (5 mg total) by mouth daily.   furosemide (LASIX) 20 MG tablet Take 1 tablet (20 mg total) by mouth daily as needed for edema.   glucose blood (ONETOUCH ULTRA) test strip Test BS daily Dx E11.9   lisinopril (ZESTRIL) 2.5 MG tablet Take 1 tablet (2.5 mg total) by mouth daily.   nitroGLYCERIN (NITROSTAT) 0.4 MG SL tablet Place 1 tablet (0.4 mg total) under the tongue every 5 (five) minutes x 3 doses as needed for  chest pain.   Omega-3 Fatty Acids (FISH OIL) 1200 MG CAPS Take 1 capsule by mouth 2 (two) times daily.    oxybutynin (DITROPAN) 5 MG tablet Take 0.5 tablets (2.5 mg total) by mouth 3 (three) times daily.   rosuvastatin (CRESTOR) 40 MG tablet TAKE 1 TABLET BY MOUTH DAILY   senna-docusate (SENOKOT-S) 8.6-50 MG tablet Take 1 tablet by mouth daily.   tamsulosin (FLOMAX) 0.4 MG CAPS capsule Take 1 capsule (0.4 mg total) by mouth every morning.   TURMERIC PO Take 1,000 mg by mouth 2 (two) times daily.   vitamin C (ASCORBIC ACID) 500 MG tablet Take 1,000 mg by mouth 2 (two) times daily.   [DISCONTINUED] dapagliflozin propanediol (FARXIGA) 10 MG TABS tablet Take 1 tablet (10 mg total) by mouth daily before breakfast.   [DISCONTINUED] glimepiride (AMARYL) 4 MG tablet Take 1 tablet (4 mg total) by mouth in the morning and at bedtime.   dapagliflozin propanediol (FARXIGA) 10 MG TABS tablet Take 1 tablet (10 mg total) by mouth daily before breakfast.   glimepiride (AMARYL) 4 MG tablet Take 1 tablet (4 mg total) by mouth in the morning and at bedtime.   No facility-administered encounter medications on file as of 01/29/2023.    No Known Allergies  Pertinent ROS per HPI, otherwise unremarkable      Objective:  BP 117/66   Pulse 60   Temp (!)  97 F (36.1 C)   Ht 5\' 9"  (1.753 m)   Wt 168 lb (76.2 kg)   SpO2 95%   BMI 24.81 kg/m    Wt Readings from Last 3 Encounters:  01/29/23 168 lb (76.2 kg)  12/02/22 164 lb 12.8 oz (74.8 kg)  10/27/22 166 lb (75.3 kg)    Physical Exam Vitals and nursing note reviewed.  Constitutional:      General: He is not in acute distress.    Appearance: Normal appearance. He is normal weight. He is not ill-appearing, toxic-appearing or diaphoretic.  HENT:     Head: Normocephalic and atraumatic.     Mouth/Throat:     Mouth: Mucous membranes are moist.  Eyes:     Conjunctiva/sclera: Conjunctivae normal.     Pupils: Pupils are equal, round, and reactive to light.  Neck:     Vascular: No carotid bruit.  Cardiovascular:     Rate and Rhythm: Normal rate and regular rhythm.     Heart sounds: Normal heart sounds.  Pulmonary:     Effort: Pulmonary effort is normal.     Breath sounds: Normal breath sounds.  Chest:     Comments: ICD pocket well healed Musculoskeletal:     Cervical back: Neck supple.     Right lower leg: No edema.     Left lower leg: No edema.     Comments: Mild kyphosis  Lymphadenopathy:     Cervical: No cervical adenopathy.  Skin:    General: Skin is warm and dry.     Capillary Refill: Capillary refill takes less than 2 seconds.  Neurological:     General: No focal deficit present.     Mental Status: He is alert and oriented to person, place, and time.  Psychiatric:        Mood and Affect: Mood normal.        Behavior: Behavior normal.        Thought Content: Thought content normal.        Judgment: Judgment normal.     Results for orders placed or performed  in visit on 01/06/23  CUP PACEART REMOTE DEVICE CHECK   Collection Time: 01/06/23  1:24 AM  Result Value Ref Range   Date Time Interrogation Session [id number]    Pulse Generator Manufacturer MERM    Pulse Gen Model [id number] Claria MRI Quad CRT-D    Pulse Gen Serial Number [id number]    Clinic Name [facility]    Implantable Pulse Generator Type Cardiac Resynch Therapy Defibulator    Implantable Pulse Generator Implant Date 30865784    Implantable Lead Manufacturer MERM    Implantable Lead Model 4598 Attain Performa S MRI SureScan    Implantable Lead Serial Number [id number]    Implantable Lead Implant Date [id number]    Implantable Lead Location Detail 1 UNKNOWN    Implantable Lead Location [id number]    Implantable Lead Connection Status [id number]    Implantable Lead Manufacturer MERM    Implantable Lead Model 5076 CapSureFix Novus MRI SureScan    Implantable Lead Serial Number [id number]    Implantable Lead Implant Date [id number]    Implantable Lead Location Detail 1 UNKNOWN    Implantable Lead Location [id number]    Implantable Lead Connection Status [id number]    Implantable Lead Manufacturer MERM    Implantable Lead Model 6935 Sprint Quattro Secure S MRI SureScan    Implantable Lead Serial Number [id number]    Implantable Lead Implant Date [id number]    Implantable Lead Location Detail 1 UNKNOWN    Implantable Lead Location [id number]    Implantable Lead Connection Status L088196    Lead Channel Setting Sensing Sensitivity 0.3 mV   Lead Channel Setting Pacing Amplitude 2 V   Lead Channel Setting Pacing Pulse Width 0.4 ms   Lead Channel Setting Pacing Amplitude 2.5 V   Lead Channel Setting Pacing Pulse Width 0.4 ms   Lead Channel Setting Pacing Amplitude 2 V   Lead Channel Setting Pacing Capture Mode Adaptive Capture    Zone Setting Status Active    Zone Setting Status Inactive    Zone Setting Status Inactive    Zone Setting Status 755011    Zone Setting Status [phone]    Lead Channel Impedance Value 342 ohm   Lead Channel Sensing Intrinsic Amplitude 2.75 mV   Lead Channel Sensing Intrinsic Amplitude 2.75 mV   Lead Channel Pacing Threshold Amplitude 0.625 V   Lead Channel Pacing Threshold Pulse Width 0.4 ms   Lead Channel Impedance Value 665 ohm   Lead Channel Impedance Value 589 ohm   Lead  Channel Sensing Intrinsic Amplitude 9.125 mV   Lead Channel Sensing Intrinsic Amplitude 9.125 mV   Lead Channel Pacing Threshold Amplitude 0.375 V   Lead Channel Pacing Threshold Pulse Width 0.4 ms   HighPow Impedance 79 ohm   Lead Channel Impedance Value 494 ohm   Lead Channel Impedance Value 494 ohm   Lead Channel Impedance Value 494 ohm   Lead Channel Impedance Value 380 ohm   Lead Channel Impedance Value 513 ohm   Lead Channel Impedance Value 456 ohm   Lead Channel Impedance Value 304 ohm   Lead Channel Impedance Value 304 ohm   Lead Channel Impedance Value 266 ohm   Lead Channel Impedance Value 266 ohm   Lead Channel Impedance Value 152 ohm   Lead Channel Impedance Value 141.867    Lead Channel Impedance Value 141.867    Lead Channel Impedance Value 141.867    Lead Channel Impedance Value 133 ohm   Lead Channel Pacing Threshold Amplitude 1.5 V  Lead Channel Pacing Threshold Pulse Width 0.4 ms   Battery Status OK    Battery Remaining Longevity 20 mo   Battery Voltage 2.91 V   Brady Statistic RA Percent Paced 53.61 %   Brady Statistic RV Percent Paced 95.9 %   Brady Statistic AP VP Percent 53.69 %   Brady Statistic AS VP Percent 45.03 %   Brady Statistic AP VS Percent 0.03 %   Brady Statistic AS VS Percent 1.25 %       Pertinent labs & imaging results that were available during my care of the patient were reviewed by me and considered in my medical decision making.  Assessment & Plan:  Omar Williams was seen today for diabetes.  Diagnoses and all orders for this visit:  Type 2 diabetes mellitus with other specified complication, without long-term current use of insulin (HCC) -     CMP14+EGFR -     Bayer DCA Hb A1c Waived -     glimepiride (AMARYL) 4 MG tablet; Take 1 tablet (4 mg total) by mouth in the morning and at bedtime. -     dapagliflozin propanediol (FARXIGA) 10 MG TABS tablet; Take 1 tablet (10 mg total) by mouth daily before breakfast.  Hypertension associated  with type 2 diabetes mellitus (HCC) -     CMP14+EGFR  Hyperlipidemia associated with type 2 diabetes mellitus (HCC) -     CMP14+EGFR  CKD stage 3 due to type 2 diabetes mellitus (HCC) -     CMP14+EGFR -     dapagliflozin propanediol (FARXIGA) 10 MG TABS tablet; Take 1 tablet (10 mg total) by mouth daily before breakfast.  Chronic systolic heart failure (HCC) -     CMP14+EGFR -     dapagliflozin propanediol (FARXIGA) 10 MG TABS tablet; Take 1 tablet (10 mg total) by mouth daily before breakfast.  BPH with obstruction/lower urinary tract symptoms Followed by urology    Assessment and Plan    Type 2 Diabetes Mellitus Well-controlled with current medications. Blood glucose levels between 107 and 142. A1c improved from 7.2 to 6.6. No increased hunger, thirst, or urination. No side effects from Amaryl or Farxiga. - Send Amaryl prescription to [facility] - Send Farxiga prescription to [facility] mail order pharmacy - Follow-up in 3-4 months unless issues arise  Hypertension Well-controlled with current medications. No headaches, chest pain, or medication side effects. Continues on lisinopril and Lasix. - Continue lisinopril as prescribed - Monitor blood pressure regularly  Heart Failure Well-managed. No increased fatigue, shortness of breath, chest pain, or leg swelling. Recent cardiologist report indicates normal status. - Continue current medications - Follow-up with cardiologist as scheduled  Benign Prostatic Hyperplasia (BPH) Managed with Proscar and Flomax. Reports nocturia about four times per night, likely exacerbated by Lasix. Takes Lasix earlier in the day to manage symptoms. - Continue Proscar and Flomax - Monitor nocturia and adjust Lasix timing as needed  General Health Maintenance No changes in vision, no retinopathy, no nausea, vomiting, or night sweats. Regular bowel movements without frequent use of Senokot. No new symptoms reported. - Continue regular eye exams -  Use Senokot as needed for constipation  Follow-up - Follow-up with Dr. Kennedy Bucker on May 28 - Follow-up with Dr. Wilson Singer in the next couple of months - Follow-up with cardiologist as scheduled.          Continue all other maintenance medications.  Follow up plan: Return in about 3 months (around 04/29/2023) for DM.   Continue healthy lifestyle choices, including diet (  rich in fruits, vegetables, and lean proteins, and low in salt and simple carbohydrates) and exercise (at least 30 minutes of moderate physical activity daily).  Educational handout given for DM  The above assessment and management plan was discussed with the patient. The patient verbalized understanding of and has agreed to the management plan. Patient is aware to call the clinic if they develop any new symptoms or if symptoms persist or worsen. Patient is aware when to return to the clinic for a follow-up visit. Patient educated on when it is appropriate to go to the emergency department.   Kari Baars, FNP-C [facility] [phone]

## 2023-01-29 NOTE — Patient Instructions (Signed)

## 2023-01-30 LAB — CMP14+EGFR
ALT: 20 [IU]/L (ref 0–44)
AST: 20 [IU]/L (ref 0–40)
Albumin: 4.4 g/dL (ref 3.7–4.7)
Alkaline Phosphatase: 93 [IU]/L (ref 44–121)
BUN/Creatinine Ratio: 15 (ref 10–24)
BUN: 25 mg/dL (ref 8–27)
Bilirubin Total: 0.5 mg/dL (ref 0.0–1.2)
CO2: 23 mmol/L (ref 20–29)
Calcium: 9.9 mg/dL (ref 8.6–10.2)
Chloride: 106 mmol/L (ref 96–106)
Creatinine, Ser: 1.64 mg/dL — ABNORMAL HIGH (ref 0.76–1.27)
Globulin, Total: 2.4 g/dL (ref 1.5–4.5)
Glucose: 112 mg/dL — ABNORMAL HIGH (ref 70–99)
Potassium: 4.9 mmol/L (ref 3.5–5.2)
Sodium: 145 mmol/L — ABNORMAL HIGH (ref 134–144)
Total Protein: 6.8 g/dL (ref 6.0–8.5)
eGFR: 41 mL/min/{1.73_m2} — ABNORMAL LOW (ref >59)

## 2023-02-01 ENCOUNTER — Encounter: Payer: Self-pay | Admitting: Family Medicine

## 2023-02-04 ENCOUNTER — Other Ambulatory Visit: Payer: Self-pay | Admitting: *Deleted

## 2023-02-04 DIAGNOSIS — E1122 Type 2 diabetes mellitus with diabetic chronic kidney disease: Secondary | ICD-10-CM

## 2023-02-19 ENCOUNTER — Other Ambulatory Visit: Payer: Medicare Other

## 2023-02-19 DIAGNOSIS — E1122 Type 2 diabetes mellitus with diabetic chronic kidney disease: Secondary | ICD-10-CM | POA: Diagnosis not present

## 2023-02-19 DIAGNOSIS — N183 Chronic kidney disease, stage 3 unspecified: Secondary | ICD-10-CM | POA: Diagnosis not present

## 2023-02-20 LAB — CMP14+EGFR
ALT: 20 [IU]/L (ref 0–44)
AST: 18 [IU]/L (ref 0–40)
Albumin: 4.6 g/dL (ref 3.7–4.7)
Alkaline Phosphatase: 89 [IU]/L (ref 44–121)
BUN/Creatinine Ratio: 20 (ref 10–24)
BUN: 32 mg/dL — ABNORMAL HIGH (ref 8–27)
Bilirubin Total: 0.7 mg/dL (ref 0.0–1.2)
CO2: 21 mmol/L (ref 20–29)
Calcium: 10 mg/dL (ref 8.6–10.2)
Chloride: 104 mmol/L (ref 96–106)
Creatinine, Ser: 1.59 mg/dL — ABNORMAL HIGH (ref 0.76–1.27)
Globulin, Total: 2.6 g/dL (ref 1.5–4.5)
Glucose: 116 mg/dL — ABNORMAL HIGH (ref 70–99)
Potassium: 5.2 mmol/L (ref 3.5–5.2)
Sodium: 144 mmol/L (ref 134–144)
Total Protein: 7.2 g/dL (ref 6.0–8.5)
eGFR: 42 mL/min/{1.73_m2} — ABNORMAL LOW (ref >59)

## 2023-02-22 ENCOUNTER — Encounter: Payer: Self-pay | Admitting: Family Medicine

## 2023-02-23 NOTE — Telephone Encounter (Unsigned)
Copied from CRM [id number]. Topic: Clinical - Lab/Test Results >> Feb 23, 2023 10:06 AM Lorretta Harp wrote: Reason for CRM: Patient missed a call from Dr. Reginia Forts' clinic in regards to lab results per encounter notes. Please call patient at [phone].

## 2023-03-01 ENCOUNTER — Ambulatory Visit: Payer: Medicare Other | Attending: Cardiovascular Disease

## 2023-03-01 DIAGNOSIS — Z95810 Presence of automatic (implantable) cardiac defibrillator: Secondary | ICD-10-CM | POA: Diagnosis not present

## 2023-03-01 DIAGNOSIS — I5022 Chronic systolic (congestive) heart failure: Secondary | ICD-10-CM

## 2023-03-02 DIAGNOSIS — K080 Exfoliation of teeth due to systemic causes: Secondary | ICD-10-CM | POA: Diagnosis not present

## 2023-03-02 NOTE — Progress Notes (Signed)
EPIC Encounter for ICM Monitoring  Patient Name: Omar Williams is a 88 y.o. male Date: 03/02/2023 Primary Care Physican: Sonny Masters, FNP Primary Cardiologist:  Wyline Mood Electrophysiologist: Mealor Nephrologist: [facility] Bi-V Pacing: 99% 10/22/2021 Weight: 161-162 lbs 04/27/2022 Weight: 160 lbs 06/05/2022 Office Weight: 165 lbs 08/11/2022 Weight: 162 lbs  09/15/2022 Weight: 162 lbs 03/02/2023 Weight: 165-167 lbs   Since 25-Jan-2023 Time in AT/AF  0.0 hr/day (0.0%)   Spoke with patient and heart failure questions reviewed.  Transmission results reviewed.  Pt asymptomatic for fluid accumulation.  Reports feeling well at this time and voices no complaints.       Optivol Thoracic impedance suggesting possible fluid accumulation starting 1/24 but trending close to baseline.    Prescribed:  Furosemide 20 mg take 1 tablet as needed for swelling.  Pt takes 1 tablet daily instead of PRN   Labs: 02/19/2023 Creatinine 1.59, BUN 32, Potassium 5.2, Sodium 144, GFR 42  01/29/2023 Creatinine 1.64, BUN 25, Potassium 4.9, Sodium 145, GFR 41  10/27/2022 Creatinine 1.40, BUN 21, Potassium 4.3, Sodium 144, GFR 49 07/21/2022 Creatinine 1.44, BUN 25, Potassium 5.3, Sodium 140, GFR 48 05/25/2022 Creatinine 1.53, BUN 27, Potassium 4.8, Sodium 141, GFR 44 04/29/2022 Creatinine 1.50, BUN 27, Potassium 3.8, Sodium 143, GFR 46 A complete set of results can be found in Results Review.   Recommendations:    Encouraged to limit salt and fluid intake.  No changes and encouraged to call if experiencing any fluid symptoms.   Follow-up plan: ICM clinic phone appointment on 04/05/2023.   91 day device clinic remote transmission 04/07/2023.       EP/Cardiology Office Visits:  06/04/2023 with Dr Nelly Laurence.   06/30/2023 with Dr Wyline Mood.   Copy of ICM check sent to Dr. Nelly Laurence.   3 month ICM trend: 03/01/2023.    12-14 Month ICM trend:     Karie Soda, RN 03/02/2023 10:53 AM

## 2023-03-09 ENCOUNTER — Encounter: Payer: Self-pay | Admitting: *Deleted

## 2023-03-19 ENCOUNTER — Telehealth: Payer: Self-pay

## 2023-03-19 NOTE — Telephone Encounter (Signed)
Returned call to patient. He wanted to confirm the monitor is connected.  He travel to [place] and plugged up when he returned home and was not sure if it is working.  Advised it is showing as connected.  Appreciated the call back.

## 2023-04-05 ENCOUNTER — Ambulatory Visit: Payer: Medicare Other | Attending: Cardiovascular Disease

## 2023-04-05 DIAGNOSIS — Z95810 Presence of automatic (implantable) cardiac defibrillator: Secondary | ICD-10-CM | POA: Diagnosis not present

## 2023-04-05 DIAGNOSIS — I5022 Chronic systolic (congestive) heart failure: Secondary | ICD-10-CM | POA: Diagnosis not present

## 2023-04-07 ENCOUNTER — Ambulatory Visit (INDEPENDENT_AMBULATORY_CARE_PROVIDER_SITE_OTHER): Payer: Medicare Other

## 2023-04-07 DIAGNOSIS — I255 Ischemic cardiomyopathy: Secondary | ICD-10-CM

## 2023-04-09 ENCOUNTER — Telehealth: Payer: Self-pay

## 2023-04-09 LAB — CUP PACEART REMOTE DEVICE CHECK
Battery Remaining Longevity: 18 mo
Battery Voltage: 2.91 V
Brady Statistic AP VP Percent: 41.94 %
Brady Statistic AP VS Percent: 0.01 %
Brady Statistic AS VP Percent: 57.51 %
Brady Statistic AS VS Percent: 0.55 %
Brady Statistic RA Percent Paced: 41.93 %
Brady Statistic RV Percent Paced: 99.31 %
Date Time Interrogation Session: 20250305002207
HighPow Impedance: 85 Ohm
Implantable Lead Connection Status: 753985
Implantable Lead Connection Status: 753985
Implantable Lead Connection Status: 753985
Implantable Lead Implant Date: 20181130
Implantable Lead Implant Date: 20181130
Implantable Lead Implant Date: 20181130
Implantable Lead Location: 753858
Implantable Lead Location: 753859
Implantable Lead Location: 753860
Implantable Lead Model: 4598
Implantable Lead Model: 5076
Implantable Lead Model: 6935
Implantable Pulse Generator Implant Date: 20181130
Lead Channel Impedance Value: 152 Ohm
Lead Channel Impedance Value: 152 Ohm
Lead Channel Impedance Value: 156.606
Lead Channel Impedance Value: 156.606
Lead Channel Impedance Value: 156.606
Lead Channel Impedance Value: 304 Ohm
Lead Channel Impedance Value: 304 Ohm
Lead Channel Impedance Value: 304 Ohm
Lead Channel Impedance Value: 323 Ohm
Lead Channel Impedance Value: 342 Ohm
Lead Channel Impedance Value: 342 Ohm
Lead Channel Impedance Value: 456 Ohm
Lead Channel Impedance Value: 513 Ohm
Lead Channel Impedance Value: 513 Ohm
Lead Channel Impedance Value: 513 Ohm
Lead Channel Impedance Value: 532 Ohm
Lead Channel Impedance Value: 589 Ohm
Lead Channel Impedance Value: 665 Ohm
Lead Channel Pacing Threshold Amplitude: 0.375 V
Lead Channel Pacing Threshold Amplitude: 0.625 V
Lead Channel Pacing Threshold Amplitude: 1.375 V
Lead Channel Pacing Threshold Pulse Width: 0.4 ms
Lead Channel Pacing Threshold Pulse Width: 0.4 ms
Lead Channel Pacing Threshold Pulse Width: 0.4 ms
Lead Channel Sensing Intrinsic Amplitude: 2.875 mV
Lead Channel Sensing Intrinsic Amplitude: 2.875 mV
Lead Channel Sensing Intrinsic Amplitude: 9 mV
Lead Channel Sensing Intrinsic Amplitude: 9 mV
Lead Channel Setting Pacing Amplitude: 2 V
Lead Channel Setting Pacing Amplitude: 2 V
Lead Channel Setting Pacing Amplitude: 2.5 V
Lead Channel Setting Pacing Pulse Width: 0.4 ms
Lead Channel Setting Pacing Pulse Width: 0.4 ms
Lead Channel Setting Sensing Sensitivity: 0.3 mV
Zone Setting Status: 755011
Zone Setting Status: 755011

## 2023-04-09 NOTE — Progress Notes (Signed)
 EPIC Encounter for ICM Monitoring  Patient Name: Omar Williams is a 88 y.o. male Date: 04/09/2023 Primary Care Physican: Sonny Masters, FNP Primary Cardiologist:  Wyline Mood Electrophysiologist: Mealor Nephrologist: [facility] Bi-V Pacing: 99.0% 10/22/2021 Weight: 161-162 lbs 04/27/2022 Weight: 160 lbs 06/05/2022 Office Weight: 165 lbs 08/11/2022 Weight: 162 lbs  09/15/2022 Weight: 162 lbs 03/02/2023 Weight: 165-167 lbs   Since 01-Mar-2023 Time in AT/AF  0.0 hr/day (0.0%)   Attempted call to patient and unable to reach.  Left detailed message per DPR regarding transmission.  Transmission results reviewed.    Optivol Thoracic impedance suggesting normal fluid levels since 2/10.    Prescribed:  Furosemide 20 mg take 1 tablet as needed for swelling.  Pt takes 1 tablet daily instead of PRN   Labs: 02/19/2023 Creatinine 1.59, BUN 32, Potassium 5.2, Sodium 144, GFR 42  01/29/2023 Creatinine 1.64, BUN 25, Potassium 4.9, Sodium 145, GFR 41  10/27/2022 Creatinine 1.40, BUN 21, Potassium 4.3, Sodium 144, GFR 49 07/21/2022 Creatinine 1.44, BUN 25, Potassium 5.3, Sodium 140, GFR 48 05/25/2022 Creatinine 1.53, BUN 27, Potassium 4.8, Sodium 141, GFR 44 04/29/2022 Creatinine 1.50, BUN 27, Potassium 3.8, Sodium 143, GFR 46 A complete set of results can be found in Results Review.   Recommendations:   Left voice mail with ICM number and encouraged to call if experiencing any fluid symptoms.   Follow-up plan: ICM clinic phone appointment on 05/10/2023.   91 day device clinic remote transmission 07/07/2023.       EP/Cardiology Office Visits:  06/04/2023 with Dr Nelly Laurence.   06/30/2023 with Dr Wyline Mood.   Copy of ICM check sent to Dr. Nelly Laurence.   3 month ICM trend: 04/05/2023.    12-14 Month ICM trend:     Karie Soda, RN 04/09/2023 12:01 PM

## 2023-04-09 NOTE — Telephone Encounter (Signed)
 Remote ICM transmission received.  Attempted call to patient regarding ICM remote transmission and left detailed message per DPR.  Left ICM phone number and advised to return call for any fluid symptoms or questions. Next ICM remote transmission scheduled 05/10/2023.

## 2023-04-11 ENCOUNTER — Encounter: Payer: Self-pay | Admitting: Cardiovascular Disease

## 2023-04-21 DIAGNOSIS — K080 Exfoliation of teeth due to systemic causes: Secondary | ICD-10-CM | POA: Diagnosis not present

## 2023-04-22 ENCOUNTER — Other Ambulatory Visit: Payer: Self-pay | Admitting: Family Medicine

## 2023-04-22 DIAGNOSIS — I5022 Chronic systolic (congestive) heart failure: Secondary | ICD-10-CM

## 2023-04-28 ENCOUNTER — Ambulatory Visit (INDEPENDENT_AMBULATORY_CARE_PROVIDER_SITE_OTHER): Payer: Medicare Other | Admitting: Family Medicine

## 2023-04-28 ENCOUNTER — Encounter: Payer: Self-pay | Admitting: Family Medicine

## 2023-04-28 VITALS — BP 120/70 | HR 60 | Temp 97.7°F | Ht 69.0 in

## 2023-04-28 DIAGNOSIS — E1169 Type 2 diabetes mellitus with other specified complication: Secondary | ICD-10-CM | POA: Diagnosis not present

## 2023-04-28 DIAGNOSIS — I5022 Chronic systolic (congestive) heart failure: Secondary | ICD-10-CM

## 2023-04-28 DIAGNOSIS — E1122 Type 2 diabetes mellitus with diabetic chronic kidney disease: Secondary | ICD-10-CM

## 2023-04-28 DIAGNOSIS — N138 Other obstructive and reflux uropathy: Secondary | ICD-10-CM

## 2023-04-28 DIAGNOSIS — K219 Gastro-esophageal reflux disease without esophagitis: Secondary | ICD-10-CM

## 2023-04-28 DIAGNOSIS — I152 Hypertension secondary to endocrine disorders: Secondary | ICD-10-CM

## 2023-04-28 DIAGNOSIS — N401 Enlarged prostate with lower urinary tract symptoms: Secondary | ICD-10-CM

## 2023-04-28 DIAGNOSIS — N183 Chronic kidney disease, stage 3 unspecified: Secondary | ICD-10-CM

## 2023-04-28 DIAGNOSIS — E1121 Type 2 diabetes mellitus with diabetic nephropathy: Secondary | ICD-10-CM

## 2023-04-28 DIAGNOSIS — Z95811 Presence of heart assist device: Secondary | ICD-10-CM

## 2023-04-28 DIAGNOSIS — E1159 Type 2 diabetes mellitus with other circulatory complications: Secondary | ICD-10-CM

## 2023-04-28 DIAGNOSIS — I130 Hypertensive heart and chronic kidney disease with heart failure and stage 1 through stage 4 chronic kidney disease, or unspecified chronic kidney disease: Secondary | ICD-10-CM

## 2023-04-28 DIAGNOSIS — I25118 Atherosclerotic heart disease of native coronary artery with other forms of angina pectoris: Secondary | ICD-10-CM

## 2023-04-28 DIAGNOSIS — I252 Old myocardial infarction: Secondary | ICD-10-CM

## 2023-04-28 DIAGNOSIS — M65331 Trigger finger, right middle finger: Secondary | ICD-10-CM

## 2023-04-28 LAB — BAYER DCA HB A1C WAIVED: HB A1C (BAYER DCA - WAIVED): 6.8 % — ABNORMAL HIGH (ref 4.8–5.6)

## 2023-04-28 MED ORDER — DAPAGLIFLOZIN PROPANEDIOL 10 MG PO TABS: 10.0000 mg | ORAL_TABLET | Freq: Every day | ORAL | 5 refills | Status: DC

## 2023-04-28 MED ORDER — LISINOPRIL 2.5 MG PO TABS: 2.5000 mg | ORAL_TABLET | Freq: Every day | ORAL | 1 refills | Status: DC

## 2023-04-28 MED ORDER — NITROGLYCERIN 0.4 MG SL SUBL: 0.4000 mg | SUBLINGUAL_TABLET | SUBLINGUAL | 3 refills | Status: AC | PRN

## 2023-04-28 MED ORDER — FINASTERIDE 5 MG PO TABS: 5.0000 mg | ORAL_TABLET | Freq: Every day | ORAL | 1 refills | Status: DC

## 2023-04-28 NOTE — Patient Instructions (Addendum)

## 2023-04-28 NOTE — Progress Notes (Signed)
 Subjective:  Patient ID: Omar Williams, male    DOB: [birth year], 88 y.o.   MRN: [id number]  Patient Care Team: Sonny Masters, FNP as PCP - General (Family Medicine) Wyline Mood, Dorothe Pea, MD as PCP - Cardiology (Cardiology) Mealor, Roberts Gaudy, MD as PCP - Electrophysiology (Cardiology) Genelle Gather, OD (Optometry) Netta Neat., NP as Nurse Practitioner (Cardiology) Randa Lynn, MD as Consulting Physician (Nephrology) Bjorn Pippin, MD as Attending Physician (Urology) Franky Macho, MD as Consulting Physician (General Surgery) Danella Maiers, [facility] as Pharmacist (Family Medicine)   Chief Complaint:  Medical Management of Chronic Issues   HPI: Omar Williams is a 88 y.o. male presenting on 04/28/2023 for Medical Management of Chronic Issues   History of Present Illness   Omar Williams is an 88 year old male with diabetes and heart disease who presents for routine follow-up.  Blood sugar levels range from 88 to 131 mg/dL. When levels drop to 88 mg/dL, he drinks milk to raise it. He is currently taking [place] through a mail-order program without issues. No increased hunger, thirst, or urination.  No chest pain, palpitations, or activation of his defibrillator, which has not fired in almost four years. He has not needed nitroglycerin for chest pain in a long time.  He takes finasteride (Proscar) and gets up about three times a night to urinate, attributing this to high fluid intake, including water and decaf tea. No changes in urine output, blood in urine, or scrotal pressure.  He experiences a 'trigger finger' that occasionally locks and is sore, with no relief from topical creams like Biofreeze. This issue has been present for a long time.  He takes medication for cholesterol without problems and uses an antacid when consuming spicy foods, chocolate, or caffeine, which can cause acid reflux symptoms.  No swelling in legs or feet and maintains regular bowel movements. 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          Relevant past medical, surgical, family, and social history reviewed and updated as indicated.  Allergies and medications reviewed and updated. Data reviewed: Chart in Epic.   Past Medical History:  Diagnosis Date   Acid reflux    Blood transfusion without reported diagnosis    Cardiac arrest (HCC)    a. occuring in 12/2016. LAD disease but no targets for CABG or PCI. Underwent ICD placement as EF 15-20%.    CHF (congestive heart failure) (HCC)    Coronary artery disease    Diabetes mellitus without complication (HCC)    Diabetic nephropathy (HCC)    Hyperlipidemia    Hypertension    Myocardial infarction [facility])    Renal disorder     Past Surgical History:  Procedure Laterality Date   BIV ICD INSERTION CRT-D N/A 01/01/2017   Procedure: BIV ICD INSERTION CRT-D;  Surgeon: Regan Lemming, MD;  Location: [facility] INVASIVE CV LAB;  Service: Cardiovascular;  Laterality: N/A;    Social History   Socioeconomic History   Marital status: Married    Spouse name: johnna   Number of children: 3   Years of education: 12   Highest education level: Some college, no degree  Occupational History   Occupation: Health and safety inspector   retired   Occupation: Education officer, environmental  Tobacco Use   Smoking status: Former    Current packs/day: 0.00    Types: Cigarettes    Quit date: Medical laboratory scientific officer  Years since quitting: 65.2    Passive exposure: Never   Smokeless tobacco: Never   Tobacco comments:    2-3 years as a teenager  Vaping Use   Vaping status: Never Used  Substance and Sexual Activity   Alcohol use: No   Drug use: No   Sexual activity: Not Currently  Other Topics Concern   Not on file  Social History Narrative   Lives with with wife [name]   Married 60 years   Daughter [name] stays frequently   Granddaughter [name] near by   Social Drivers of [employer]: Low Risk   (06/22/2022)   Overall Financial Resource Strain (CARDIA)    Difficulty of Paying Living Expenses: Not hard at all  Food Insecurity: No Food Insecurity (06/22/2022)   Hunger Vital Sign    Worried About Running Out of Food in the Last Year: Never true    Ran Out of Food in the Last Year: Never true  Transportation Needs: No Transportation Needs (06/22/2022)   PRAPARE - Administrator, Civil Service (Medical): No    Lack of Transportation (Non-Medical): No  Physical Activity: Sufficiently Active (06/22/2022)   Exercise Vital Sign    Days of Exercise per Week: 7 days    Minutes of Exercise per Session: 30 min  Stress: No Stress Concern Present (06/22/2022)   [employer] of Occupational Health - Occupational Stress Questionnaire    Feeling of Stress : Not at all  Social Connections: Socially Integrated (06/22/2022)   Social Connection and Isolation Panel [NHANES]    Frequency of Communication with Friends and Family: More than three times a week    Frequency of Social Gatherings with Friends and Family: More than three times a week    Attends Religious Services: More than 4 times per year    Active Member of [employer] or Organizations: Yes    Attends Engineer, structural: More than 4 times per year    Marital Status: Married  Catering manager Violence: Not At Risk (06/22/2022)   Humiliation, Afraid, Rape, and Kick questionnaire    Fear of Current or Ex-Partner: No    Emotionally Abused: No    Physically Abused: No    Sexually Abused: No    Outpatient Encounter Medications as of 04/28/2023  Medication Sig   aspirin 81 MG chewable tablet Chew 81 mg by mouth daily.   Bee Pollen 550 MG CAPS Take 2 capsules by mouth 2 (two) times daily.   chlorhexidine (PERIDEX) 0.12 % solution as needed.   Cholecalciferol (VITAMIN D3) 125 MCG (5000 UT) TABS Take 1 tablet by mouth daily.    CINNAMON PO Take 2,000 mg by mouth in the morning and at bedtime.   cyanocobalamin (VITAMIN B12)  1000 MCG tablet Take 1,000 mcg by mouth 2 (two) times daily.   ezetimibe (ZETIA) 10 MG tablet TAKE 1 TABLET BY MOUTH DAILY   furosemide (LASIX) 20 MG tablet TAKE 1 TABLET BY MOUTH ONCE DAILY AS NEEDED FOR EDEMA.   glimepiride (AMARYL) 4 MG tablet Take 1 tablet (4 mg total) by mouth in the morning and at bedtime.   glucose blood (ONETOUCH ULTRA) test strip Test BS daily Dx E11.9   Omega-3 Fatty Acids (FISH OIL) 1200 MG CAPS Take 1 capsule by mouth 2 (two) times daily.    oxybutynin (DITROPAN) 5 MG tablet Take 0.5 tablets (2.5 mg total) by mouth 3 (three) times daily.   rosuvastatin (CRESTOR) 40 MG tablet  TAKE 1 TABLET BY MOUTH DAILY   senna-docusate (SENOKOT-S) 8.6-50 MG tablet Take 1 tablet by mouth daily.   tamsulosin (FLOMAX) 0.4 MG CAPS capsule Take 1 capsule (0.4 mg total) by mouth every morning.   TURMERIC PO Take 1,000 mg by mouth 2 (two) times daily.   vitamin C (ASCORBIC ACID) 500 MG tablet Take 1,000 mg by mouth 2 (two) times daily.   [DISCONTINUED] dapagliflozin propanediol (FARXIGA) 10 MG TABS tablet Take 1 tablet (10 mg total) by mouth daily before breakfast.   [DISCONTINUED] finasteride (PROSCAR) 5 MG tablet Take 1 tablet (5 mg total) by mouth daily.   [DISCONTINUED] lisinopril (ZESTRIL) 2.5 MG tablet Take 1 tablet (2.5 mg total) by mouth daily.   [DISCONTINUED] nitroGLYCERIN (NITROSTAT) 0.4 MG SL tablet Place 1 tablet (0.4 mg total) under the tongue every 5 (five) minutes x 3 doses as needed for chest pain.   dapagliflozin propanediol (FARXIGA) 10 MG TABS tablet Take 1 tablet (10 mg total) by mouth daily before breakfast.   finasteride (PROSCAR) 5 MG tablet Take 1 tablet (5 mg total) by mouth daily.   lisinopril (ZESTRIL) 2.5 MG tablet Take 1 tablet (2.5 mg total) by mouth daily.   nitroGLYCERIN (NITROSTAT) 0.4 MG SL tablet Place 1 tablet (0.4 mg total) under the tongue every 5 (five) minutes x 3 doses as needed for chest pain.   No facility-administered encounter medications on  file as of 04/28/2023.    No Known Allergies  Pertinent ROS per HPI, otherwise unremarkable      Objective:  BP 120/70   Pulse 60   Temp 97.7 F (36.5 C)   Ht 5\' 9"  (1.753 m)   SpO2 98%   BMI 24.81 kg/m    Wt Readings from Last 3 Encounters:  01/29/23 168 lb (76.2 kg)  12/02/22 164 lb 12.8 oz (74.8 kg)  10/27/22 166 lb (75.3 kg)    Physical Exam Vitals and nursing note reviewed.  Constitutional:      General: He is not in acute distress.    Appearance: Normal appearance. He is not ill-appearing, toxic-appearing or diaphoretic.  HENT:     Head: Normocephalic and atraumatic.     Nose: Nose normal.     Mouth/Throat:     Mouth: Mucous membranes are moist.  Eyes:     Conjunctiva/sclera: Conjunctivae normal.     Pupils: Pupils are equal, round, and reactive to light.  Neck:     Vascular: No carotid bruit.  Cardiovascular:     Rate and Rhythm: Normal rate and regular rhythm.     Pulses: Normal pulses.     Heart sounds: Normal heart sounds.  Pulmonary:     Effort: Pulmonary effort is normal.     Breath sounds: Normal breath sounds.  Chest:     Comments: ICD pocket well healed Abdominal:     General: Bowel sounds are normal. There is no distension.     Palpations: Abdomen is soft.     Tenderness: There is no abdominal tenderness.  Musculoskeletal:     Cervical back: Neck supple.     Right lower leg: No edema.     Left lower leg: No edema.     Comments: Right middle finger: slight popping with repetitive flexion and extension  Skin:    General: Skin is warm and dry.     Capillary Refill: Capillary refill takes less than 2 seconds.  Neurological:     General: No focal deficit present.     Mental  Status: He is alert and oriented to person, place, and time.  Psychiatric:        Mood and Affect: Mood normal.        Behavior: Behavior normal.        Thought Content: Thought content normal.        Judgment: Judgment normal.     Results for orders placed or  performed in visit on 04/07/23  CUP PACEART REMOTE DEVICE CHECK   Collection Time: 04/07/23 12:22 AM  Result Value Ref Range   Date Time Interrogation Session [id number]    Pulse Generator Manufacturer MERM    Pulse Gen Model [id number] Claria MRI Quad CRT-D    Pulse Gen Serial Number [id number]    Clinic Name [facility]    Implantable Pulse Generator Type Cardiac Resynch Therapy Defibulator    Implantable Pulse Generator Implant Date 86578469    Implantable Lead Manufacturer MERM    Implantable Lead Model 4598 Attain Performa S MRI SureScan    Implantable Lead Serial Number [id number]    Implantable Lead Implant Date 62952841    Implantable Lead Location Detail 1 UNKNOWN    Implantable Lead Location [id number]    Implantable Lead Connection Status [id number]    Implantable Lead Manufacturer MERM    Implantable Lead Model 5076 CapSureFix Novus MRI SureScan    Implantable Lead Serial Number [id number]    Implantable Lead Implant Date 32440102    Implantable Lead Location Detail 1 UNKNOWN    Implantable Lead Location [id number]    Implantable Lead Connection Status [id number]    Implantable Lead Manufacturer MERM    Implantable Lead Model 6935 Sprint Quattro Secure S MRI SureScan    Implantable Lead Serial Number [id number]    Implantable Lead Implant Date 72536644    Implantable Lead Location Detail 1 UNKNOWN    Implantable Lead Location [id number]    Implantable Lead Connection Status L088196    Lead Channel Setting Sensing Sensitivity 0.3 mV   Lead Channel Setting Pacing Amplitude 2 V   Lead Channel Setting Pacing Pulse Width 0.4 ms   Lead Channel Setting Pacing Amplitude 2.5 V   Lead Channel Setting Pacing Pulse Width 0.4 ms   Lead Channel Setting Pacing Amplitude 2 V   Lead Channel Setting Pacing Capture Mode Adaptive Capture    Zone Setting Status Active    Zone Setting Status Inactive    Zone Setting Status Inactive    Zone Setting Status 755011    Zone Setting Status [phone]    Lead  Channel Impedance Value 342 ohm   Lead Channel Sensing Intrinsic Amplitude 2.875 mV   Lead Channel Sensing Intrinsic Amplitude 2.875 mV   Lead Channel Pacing Threshold Amplitude 0.625 V   Lead Channel Pacing Threshold Pulse Width 0.4 ms   Lead Channel Impedance Value 665 ohm   Lead Channel Impedance Value 589 ohm   Lead Channel Sensing Intrinsic Amplitude 9 mV   Lead Channel Sensing Intrinsic Amplitude 9 mV   Lead Channel Pacing Threshold Amplitude 0.375 V   Lead Channel Pacing Threshold Pulse Width 0.4 ms   HighPow Impedance 85 ohm   Lead Channel Impedance Value 513 ohm   Lead Channel Impedance Value 513 ohm   Lead Channel Impedance Value 532 ohm   Lead Channel Impedance Value 342 ohm   Lead Channel Impedance Value 513 ohm   Lead Channel Impedance Value 456 ohm   Lead Channel Impedance Value 323 ohm   Lead Channel Impedance Value 304 ohm  Lead Channel Impedance Value 304 ohm   Lead Channel Impedance Value 304 ohm   Lead Channel Impedance Value 156.606    Lead Channel Impedance Value 156.606    Lead Channel Impedance Value 156.606    Lead Channel Impedance Value 152 ohm   Lead Channel Impedance Value 152 ohm   Lead Channel Pacing Threshold Amplitude 1.375 V   Lead Channel Pacing Threshold Pulse Width 0.4 ms   Battery Status OK    Battery Remaining Longevity 18 mo   Battery Voltage 2.91 V   Brady Statistic RA Percent Paced 41.93 %   Brady Statistic RV Percent Paced 99.31 %   Brady Statistic AP VP Percent 41.94 %   Brady Statistic AS VP Percent 57.51 %   Brady Statistic AP VS Percent 0.01 %   Brady Statistic AS VS Percent 0.55 %       Pertinent labs & imaging results that were available during my care of the patient were reviewed by me and considered in my medical decision making.  Assessment & Plan:  Andras was seen today for medical management of chronic issues.  Diagnoses and all orders for this visit:  Type 2 diabetes mellitus with other specified complication,  without long-term current use of insulin (HCC) -     Bayer DCA Hb A1c Waived -     CMP14+EGFR -     lisinopril (ZESTRIL) 2.5 MG tablet; Take 1 tablet (2.5 mg total) by mouth daily. -     dapagliflozin propanediol (FARXIGA) 10 MG TABS tablet; Take 1 tablet (10 mg total) by mouth daily before breakfast.  BPH with obstruction/lower urinary tract symptoms -     CMP14+EGFR -     finasteride (PROSCAR) 5 MG tablet; Take 1 tablet (5 mg total) by mouth daily.  Chronic systolic heart failure (HCC) -     CMP14+EGFR -     dapagliflozin propanediol (FARXIGA) 10 MG TABS tablet; Take 1 tablet (10 mg total) by mouth daily before breakfast.  Hypertension associated with type 2 diabetes mellitus (HCC) -     CMP14+EGFR -     lisinopril (ZESTRIL) 2.5 MG tablet; Take 1 tablet (2.5 mg total) by mouth daily.  CKD stage 3 due to type 2 diabetes mellitus (HCC) -     CMP14+EGFR -     lisinopril (ZESTRIL) 2.5 MG tablet; Take 1 tablet (2.5 mg total) by mouth daily. -     dapagliflozin propanediol (FARXIGA) 10 MG TABS tablet; Take 1 tablet (10 mg total) by mouth daily before breakfast.  Presence of heart assist device (HCC) -     CMP14+EGFR  History of non-ST elevation myocardial infarction (NSTEMI) -     CMP14+EGFR -     nitroGLYCERIN (NITROSTAT) 0.4 MG SL tablet; Place 1 tablet (0.4 mg total) under the tongue every 5 (five) minutes x 3 doses as needed for chest pain.  Diabetic nephropathy associated with type 2 diabetes mellitus (HCC) -     CMP14+EGFR  Coronary artery disease of native artery of native heart with stable angina pectoris (HCC) -     CMP14+EGFR  Hyperlipidemia associated with type 2 diabetes mellitus (HCC) -     CMP14+EGFR  Gastro-esophageal reflux disease without esophagitis -     CMP14+EGFR  Trigger middle finger of right hand Discussed symptomatic care in detail. Declined intervention today.     Assessment and Plan    Type 2 Diabetes Mellitus Well-controlled with Marcelline Deist. Blood  sugar levels range from 88 to 131 mg/dL.  A1c is stable at 6.8%, within the target range of less than 7%. - Continue Marcelline Deist as prescribed - Monitor blood sugar levels regularly - Reassess A1c at next visit  Coronary Artery Disease (CAD) No recent chest pain or need for nitroglycerin. Defibrillator has not fired in almost four years, indicating stable cardiac status. - Continue current cardiac medications - Ensure availability of nitroglycerin for emergency use - Follow up with cardiology as scheduled  Benign Prostatic Hyperplasia (BPH) Managed with finasteride. Reports nocturia about three times per night, likely due to high fluid intake. Recent PSA levels are stable and within normal limits. - Continue finasteride as prescribed - Monitor urinary symptoms - Repeat PSA at next visit  Gastroesophageal Reflux Disease (GERD) Symptoms occur with consumption of spicy foods, chocolate, and caffeine. Managed with antacid use as needed. - Continue antacid use as needed for symptom relief - Avoid known dietary triggers such as spicy foods, chocolate, and caffeine  Trigger Finger Chronic trigger finger with occasional locking and soreness. Current use of topical cream is ineffective. - Consider corticosteroid injection if symptoms persist or worsen          Continue all other maintenance medications.  Follow up plan: Return in about 3 months (around 07/29/2023) for DM.   Continue healthy lifestyle choices, including diet (rich in fruits, vegetables, and lean proteins, and low in salt and simple carbohydrates) and exercise (at least 30 minutes of moderate physical activity daily).  Educational handout given for DM  The above assessment and management plan was discussed with the patient. The patient verbalized understanding of and has agreed to the management plan. Patient is aware to call the clinic if they develop any new symptoms or if symptoms persist or worsen. Patient is aware when to  return to the clinic for a follow-up visit. Patient educated on when it is appropriate to go to the emergency department.   Kari Baars, FNP-C [facility] [phone]

## 2023-04-29 ENCOUNTER — Encounter: Payer: Self-pay | Admitting: Family Medicine

## 2023-04-29 LAB — CMP14+EGFR
ALT: 19 IU/L (ref 0–44)
AST: 20 IU/L (ref 0–40)
Albumin: 4.3 g/dL (ref 3.7–4.7)
Alkaline Phosphatase: 87 IU/L (ref 44–121)
BUN/Creatinine Ratio: 14 (ref 10–24)
BUN: 21 mg/dL (ref 8–27)
Bilirubin Total: 0.6 mg/dL (ref 0.0–1.2)
CO2: 23 mmol/L (ref 20–29)
Calcium: 9.7 mg/dL (ref 8.6–10.2)
Chloride: 103 mmol/L (ref 96–106)
Creatinine, Ser: 1.48 mg/dL — ABNORMAL HIGH (ref 0.76–1.27)
Globulin, Total: 2.6 g/dL (ref 1.5–4.5)
Glucose: 104 mg/dL — ABNORMAL HIGH (ref 70–99)
Potassium: 3.8 mmol/L (ref 3.5–5.2)
Sodium: 145 mmol/L — ABNORMAL HIGH (ref 134–144)
Total Protein: 6.9 g/dL (ref 6.0–8.5)
eGFR: 46 mL/min/{1.73_m2} — ABNORMAL LOW (ref >59)

## 2023-04-30 ENCOUNTER — Ambulatory Visit: Payer: Medicare Other | Admitting: Family Medicine

## 2023-04-30 ENCOUNTER — Other Ambulatory Visit: Payer: Self-pay | Admitting: Cardiology

## 2023-05-10 ENCOUNTER — Ambulatory Visit: Attending: Cardiovascular Disease

## 2023-05-10 DIAGNOSIS — Z95810 Presence of automatic (implantable) cardiac defibrillator: Secondary | ICD-10-CM | POA: Diagnosis not present

## 2023-05-10 DIAGNOSIS — I5022 Chronic systolic (congestive) heart failure: Secondary | ICD-10-CM

## 2023-05-12 NOTE — Progress Notes (Signed)
 EPIC Encounter for ICM Monitoring  Patient Name: Omar Williams is a 88 y.o. male Date: 05/12/2023 Primary Care Physican: Sonny Masters, FNP Primary Cardiologist:  Wyline Mood Electrophysiologist: Mealor Nephrologist: [facility] Bi-V Pacing: 99.0% 10/22/2021 Weight: 161-162 lbs 04/27/2022 Weight: 160 lbs 06/05/2022 Office Weight: 165 lbs 08/11/2022 Weight: 162 lbs  09/15/2022 Weight: 162 lbs 03/02/2023 Weight: 165-167 lbs 05/12/2023 Weight: 167 lbs   Since 01-Mar-2023 Time in AT/AF  0.0 hr/day (0.0%)   Spoke with patient and heart failure questions reviewed.  Transmission results reviewed.  Pt asymptomatic for fluid accumulation.  No fluid symptoms during decreased impedance.   Diet:  [employer] foods most of the time due to wife unable to cook.   Optivol Thoracic impedance suggesting normal fluid levels with the exception of possible fluid accumulation 3/26-4/6.    Prescribed:  Furosemide 20 mg take 1 tablet as needed for swelling.  Pt takes 1 tablet daily instead of PRN   Labs: 02/19/2023 Creatinine 1.59, BUN 32, Potassium 5.2, Sodium 144, GFR 42  01/29/2023 Creatinine 1.64, BUN 25, Potassium 4.9, Sodium 145, GFR 41  10/27/2022 Creatinine 1.40, BUN 21, Potassium 4.3, Sodium 144, GFR 49 07/21/2022 Creatinine 1.44, BUN 25, Potassium 5.3, Sodium 140, GFR 48 05/25/2022 Creatinine 1.53, BUN 27, Potassium 4.8, Sodium 141, GFR 44 04/29/2022 Creatinine 1.50, BUN 27, Potassium 3.8, Sodium 143, GFR 46 A complete set of results can be found in Results Review.   Recommendations:  No changes and encouraged to call if experiencing any fluid symptoms.   Follow-up plan: ICM clinic phone appointment on 06/14/2023.   91 day device clinic remote transmission 07/07/2023.       EP/Cardiology Office Visits:  06/04/2023 with Dr Nelly Laurence.   06/30/2023 with Dr Wyline Mood.   Copy of ICM check sent to Dr. Nelly Laurence.   3 month ICM trend: 05/10/2023.    12-14 Month ICM trend:     Karie Soda,  RN 05/12/2023 3:29 PM

## 2023-05-18 DIAGNOSIS — R809 Proteinuria, unspecified: Secondary | ICD-10-CM | POA: Diagnosis not present

## 2023-05-18 DIAGNOSIS — D631 Anemia in chronic kidney disease: Secondary | ICD-10-CM | POA: Diagnosis not present

## 2023-05-18 DIAGNOSIS — N189 Chronic kidney disease, unspecified: Secondary | ICD-10-CM | POA: Diagnosis not present

## 2023-05-24 ENCOUNTER — Other Ambulatory Visit: Payer: Self-pay | Admitting: Family Medicine

## 2023-05-24 DIAGNOSIS — K219 Gastro-esophageal reflux disease without esophagitis: Secondary | ICD-10-CM

## 2023-05-25 NOTE — Progress Notes (Signed)
 Remote ICD transmission.

## 2023-05-25 NOTE — Addendum Note (Signed)
 Addended by: Lott Rouleau A on: 05/25/2023 11:24 AM   Modules accepted: Orders

## 2023-05-31 DIAGNOSIS — I5032 Chronic diastolic (congestive) heart failure: Secondary | ICD-10-CM | POA: Diagnosis not present

## 2023-05-31 DIAGNOSIS — N1832 Chronic kidney disease, stage 3b: Secondary | ICD-10-CM | POA: Diagnosis not present

## 2023-05-31 DIAGNOSIS — I129 Hypertensive chronic kidney disease with stage 1 through stage 4 chronic kidney disease, or unspecified chronic kidney disease: Secondary | ICD-10-CM | POA: Diagnosis not present

## 2023-05-31 DIAGNOSIS — N1831 Chronic kidney disease, stage 3a: Secondary | ICD-10-CM | POA: Diagnosis not present

## 2023-06-03 NOTE — Progress Notes (Signed)
 Appointment cancelled

## 2023-06-04 ENCOUNTER — Encounter: Payer: Self-pay | Admitting: Cardiovascular Disease

## 2023-06-04 ENCOUNTER — Ambulatory Visit: Payer: Medicare Other | Attending: Cardiovascular Disease | Admitting: Cardiovascular Disease

## 2023-06-04 VITALS — BP 110/60 | HR 59 | Ht 68.0 in | Wt 167.4 lb

## 2023-06-04 DIAGNOSIS — I5022 Chronic systolic (congestive) heart failure: Secondary | ICD-10-CM | POA: Diagnosis not present

## 2023-06-04 LAB — CUP PACEART INCLINIC DEVICE CHECK
Date Time Interrogation Session: 20250502103254
Implantable Lead Connection Status: 753985
Implantable Lead Connection Status: 753985
Implantable Lead Connection Status: 753985
Implantable Lead Implant Date: 20181130
Implantable Lead Implant Date: 20181130
Implantable Lead Implant Date: 20181130
Implantable Lead Location: 753858
Implantable Lead Location: 753859
Implantable Lead Location: 753860
Implantable Lead Model: 4598
Implantable Lead Model: 5076
Implantable Lead Model: 6935
Implantable Pulse Generator Implant Date: 20181130

## 2023-06-04 NOTE — Progress Notes (Signed)
  Electrophysiology Office Note:    Date:  06/04/2023   ID:  Omar Williams [birth year], MRN [id number]  PCP:  Galvin Jules, FNP   [facility] Providers Cardiologist:  Armida Lander, MD Cardiology APP:  Berlin Breen., NP  Electrophysiologist:  Efraim Grange, MD     Referring MD: Galvin Jules, FNP   History of Present Illness:    Omar Williams is a 88 y.o. male with a hx listed below, significant for ischemic cardiomyopathy, VF arrest, referred for arrhythmia management.  His device interrogation today shows a single episode of atrial fibrillation on April 3.  He was traveling at the time and under more stress than usual.  He was not aware of any rhythm abnormality.  Today he reports he is doing well and has no complaints. he has no device related complaints -- no new tenderness, drainage, redness.     EKGs/Labs/Other Studies Reviewed Today:    Echocardiogram:  02/10/2021 EF 50%, moderately dilated LA   Monitors:   Stress testing:   Advanced imaging:   EKG:   EKG Interpretation Date/Time:  Friday Jun 04 2023 10:03:26 EDT Ventricular Rate:  60 PR Interval:  164 QRS Duration:  170 QT Interval:  500 QTC Calculation: 500 R Axis:   244  Text Interpretation: AV dual-paced rhythm Biventricular pacemaker detected When compared with ECG of 02-Jan-2017 07:59, Vent. rate has decreased BY   3 BPM Confirmed by Marlane Silver [phone]) on 06/04/2023 10:38:03 AM    Recent Labs: 07/21/2022: Hemoglobin 15.6; Platelets 224 04/28/2023: ALT 19; BUN 21; Creatinine, Ser 1.48; Potassium 3.8; Sodium 145     Physical Exam:    VS:  BP 110/60   Pulse (!) 59   Ht 5\' 8"  (1.727 m)   Wt 167 lb 6.4 oz (75.9 kg)   SpO2 98%   BMI 25.45 kg/m     Wt Readings from Last 3 Encounters:  06/04/23 167 lb 6.4 oz (75.9 kg)  01/29/23 168 lb (76.2 kg)  12/02/22 164 lb 12.8 oz (74.8 kg)     GEN:  Well nourished, well developed in no acute distress CARDIAC: RRR, no  murmurs, rubs, gallops The device site is normal -- no tenderness, edema, drainage, redness, threatened erosion. RESPIRATORY:  Normal work of breathing MUSCULOSKELETAL: no edema    ASSESSMENT & PLAN:    Medtronic BiV ICD Normal function He is not device dependent today  History of VF arrest No further episodes ICD in place and functioning normally  Chronic systolic heart failure Continue dapagliflozin  10, furosemide  20, lisinopril  2.5  Single episode of atrial fibrillation No recurrence on interrogation today We will continue to follow-up his remote device checks, AF alerts on Anticoagulation will need to be started if he has recurrence         Medication Adjustments/Labs and Tests Ordered: Current medicines are reviewed at length with the patient today.  Concerns regarding medicines are outlined above.  Orders Placed This Encounter  Procedures   EKG 12-Lead   No orders of the defined types were placed in this encounter.    Signed, Efraim Grange, MD  06/04/2023 10:38 AM    [facility]

## 2023-06-04 NOTE — Patient Instructions (Signed)

## 2023-06-14 ENCOUNTER — Ambulatory Visit: Attending: Cardiovascular Disease

## 2023-06-14 DIAGNOSIS — Z95810 Presence of automatic (implantable) cardiac defibrillator: Secondary | ICD-10-CM | POA: Diagnosis not present

## 2023-06-14 DIAGNOSIS — I5022 Chronic systolic (congestive) heart failure: Secondary | ICD-10-CM

## 2023-06-14 NOTE — Progress Notes (Signed)
 EPIC Encounter for ICM Monitoring  Patient Name: Omar Williams is a 88 y.o. male Date: 06/14/2023 Primary Care Physican: Galvin Jules, FNP Primary Cardiologist:  Amanda Jungling Electrophysiologist: Mealor Nephrologist: [facility] Bi-V Pacing: 99.2% 09/15/2022 Weight: 162 lbs 03/02/2023 Weight: 165-167 lbs 05/12/2023 Weight: 167 lbs 06/14/2023 Weight: 165-168 lbs   Since 04-Jun-2023 Time in AT/AF  0.0 hr/day (0.0%) (No OAC)   Spoke with patient and heart failure questions reviewed.  Transmission results reviewed.  Pt asymptomatic for fluid accumulation.  No fluid symptoms during decreased impedance.  He reports he was un [place]  until 4/27.   Diet:  06/14/2023 He reports he and his wife eat out most of the time due his wife unable to cook.    Optivol Thoracic impedance suggesting possible fluid accumulation starting 4/25.    Prescribed:  Furosemide  20 mg take 1 tablet as needed for swelling.  Pt takes 1 tablet daily instead of PRN   Labs: 04/28/2023 Creatinine 1.48, BUN 21, Potassium 3.8, Sodium 145, GFR 46 02/19/2023 Creatinine 1.59, BUN 32, Potassium 5.2, Sodium 144, GFR 42  01/29/2023 Creatinine 1.64, BUN 25, Potassium 4.9, Sodium 145, GFR 41  A complete set of results can be found in Results Review.   Recommendations:   Advised to take 1 extra Lasix  x 1 day.  Pt takes Lasix  daily instead of PRN.  Copy sent to Dr Amanda Jungling as Arlie Lain.     Follow-up plan: ICM clinic phone appointment on 06/21/2023 (manual) to recheck fluid levels.   91 day device clinic remote transmission 07/07/2023.       EP/Cardiology Office Visits:  Recall 05/29/2024 with Dr Arlester Ladd.   06/30/2023 with Dr Amanda Jungling.   Copy of ICM check sent to Dr. Arlester Ladd.   3 month ICM trend: 06/14/2023.    12-14 Month ICM trend:     Almyra Jain, RN 06/14/2023 4:10 PM

## 2023-06-21 ENCOUNTER — Ambulatory Visit: Attending: Cardiovascular Disease

## 2023-06-21 DIAGNOSIS — I5022 Chronic systolic (congestive) heart failure: Secondary | ICD-10-CM

## 2023-06-21 DIAGNOSIS — Z95810 Presence of automatic (implantable) cardiac defibrillator: Secondary | ICD-10-CM

## 2023-06-22 NOTE — Progress Notes (Signed)
 EPIC Encounter for ICM Monitoring  Patient Name: Omar Williams is a 88 y.o. male Date: 06/22/2023 Primary Care Physican: Galvin Jules, FNP Primary Cardiologist:  Amanda Jungling Electrophysiologist: Mealor Nephrologist: [facility] Bi-V Pacing: 99.4% 09/15/2022 Weight: 162 lbs 03/02/2023 Weight: 165-167 lbs 05/12/2023 Weight: 167 lbs 06/14/2023 Weight: 165-168 lbs   Since 14-Jun-2023 Time in AT/AF  0.0 hr/day (0.0%) (No OAC)   Spoke with patient and heart failure questions reviewed.  Transmission results reviewed.  Pt asymptomatic for fluid accumulation.  No fluid symptoms during decreased impedance.  He has beach vacation planned from 6/21-6/30.   Diet:  06/14/2023 He reports he and his wife eat out most of the time due his wife unable to cook.    Optivol Thoracic impedance suggesting fluid levels returned to normal after taking 1 extra Lasix .   Prescribed:  Furosemide  20 mg take 1 tablet as needed for swelling.  Pt takes 1 tablet daily instead of PRN   Labs: 04/28/2023 Creatinine 1.48, BUN 21, Potassium 3.8, Sodium 145, GFR 46 02/19/2023 Creatinine 1.59, BUN 32, Potassium 5.2, Sodium 144, GFR 42  01/29/2023 Creatinine 1.64, BUN 25, Potassium 4.9, Sodium 145, GFR 41  A complete set of results can be found in Results Review.   Recommendations:   No changes and encouraged to call if experiencing any fluid symptoms.   Follow-up plan: ICM clinic phone appointment on 07/19/2023.   91 day device clinic remote transmission 07/07/2023.       EP/Cardiology Office Visits:  Recall 05/29/2024 with Dr Arlester Ladd.   06/30/2023 with Dr Amanda Jungling.   Copy of ICM check sent to Dr. Arlester Ladd.   3 month ICM trend: 06/21/2023.    12-14 Month ICM trend:     Almyra Jain, RN 06/22/2023 12:59 PM

## 2023-06-23 ENCOUNTER — Ambulatory Visit (INDEPENDENT_AMBULATORY_CARE_PROVIDER_SITE_OTHER): Payer: Medicare Other

## 2023-06-23 VITALS — BP 120/69 | Ht 68.0 in | Wt 167.0 lb

## 2023-06-23 DIAGNOSIS — Z0000 Encounter for general adult medical examination without abnormal findings: Secondary | ICD-10-CM | POA: Diagnosis not present

## 2023-06-23 NOTE — Progress Notes (Signed)
 Subjective:   Omar Williams is a 88 y.o. who presents for a Medicare Wellness preventive visit.  As a reminder, Annual Wellness Visits don't include a physical exam, and some assessments may be limited, especially if this visit is performed virtually. We may recommend an in-person follow-up visit with your provider if needed.  Visit Complete: Virtual I connected with  Omar Williams on 06/23/23 by a audio enabled telemedicine application and verified that I am speaking with the correct person using two identifiers.  Patient Location: Home  Provider Location: Home Office  I discussed the limitations of evaluation and management by telemedicine. The patient expressed understanding and agreed to proceed.  Vital Signs: Because this visit was a virtual/telehealth visit, some criteria may be missing or patient reported. Any vitals not documented were not able to be obtained and vitals that have been documented are patient reported.  VideoDeclined- This patient declined Librarian, academic. Therefore the visit was completed with audio only.  Persons Participating in Visit: Patient.  AWV Questionnaire: No: Patient Medicare AWV questionnaire was not completed prior to this visit.  Cardiac Risk Factors include: advanced age (>57men, >72 women);diabetes mellitus;dyslipidemia;hypertension;male gender     Objective:     Today's Vitals   06/23/23 1332  BP: 120/69  Weight: 167 lb (75.8 kg)  Height: 5\' 8"  (1.727 m)   Body mass index is 25.39 kg/m.     06/23/2023    1:42 PM 06/22/2022    3:25 PM 06/16/2021    3:39 PM 06/12/2020    1:15 PM 06/02/2019    9:12 AM 01/25/2017    7:25 AM 12/30/2016   10:00 PM  Advanced Directives  Does Patient Have a Medical Advance Directive? Yes Yes No No No Yes No  Type of Estate agent of [place];Living will Healthcare Power of [place];Living will    Healthcare Power of Attorney   Does patient want to make  changes to medical advance directive?  No - Patient declined       Copy of Healthcare Power of Attorney in Chart? No - copy requested Yes - validated most recent copy scanned in chart (See row information)       Would patient like information on creating a medical advance directive?   No - Patient declined No - Patient declined No - Patient declined  No - Patient declined    Current Medications (verified) Outpatient Encounter Medications as of 06/23/2023  Medication Sig   aspirin  81 MG chewable tablet Chew 81 mg by mouth daily.   Bee Pollen 550 MG CAPS Take 2 capsules by mouth 2 (two) times daily.   chlorhexidine  (PERIDEX ) 0.12 % solution as needed.   Cholecalciferol (VITAMIN D3) 125 MCG (5000 UT) TABS Take 1 tablet by mouth daily.    CINNAMON PO Take 2,000 mg by mouth in the morning and at bedtime.   cyanocobalamin (VITAMIN B12) 1000 MCG tablet Take 1,000 mcg by mouth 2 (two) times daily.   dapagliflozin  propanediol (FARXIGA ) 10 MG TABS tablet Take 1 tablet (10 mg total) by mouth daily before breakfast.   ezetimibe  (ZETIA ) 10 MG tablet TAKE 1 TABLET BY MOUTH DAILY   finasteride  (PROSCAR ) 5 MG tablet Take 1 tablet (5 mg total) by mouth daily.   furosemide  (LASIX ) 20 MG tablet TAKE 1 TABLET BY MOUTH ONCE DAILY AS NEEDED FOR EDEMA.   glimepiride  (AMARYL ) 4 MG tablet Take 1 tablet (4 mg total) by mouth in the morning and at bedtime.  glucose blood (ONETOUCH ULTRA) test strip Test BS daily Dx E11.9   lisinopril  (ZESTRIL ) 2.5 MG tablet Take 1 tablet (2.5 mg total) by mouth daily.   nitroGLYCERIN  (NITROSTAT ) 0.4 MG SL tablet Place 1 tablet (0.4 mg total) under the tongue every 5 (five) minutes x 3 doses as needed for chest pain.   Omega-3 Fatty Acids (FISH OIL) 1200 MG CAPS Take 1 capsule by mouth 2 (two) times daily.    oxybutynin  (DITROPAN ) 5 MG tablet Take 0.5 tablets (2.5 mg total) by mouth 3 (three) times daily.   pantoprazole  (PROTONIX ) 40 MG tablet TAKE 1 TABLET BY MOUTH DAILY    rosuvastatin  (CRESTOR ) 40 MG tablet TAKE 1 TABLET BY MOUTH DAILY   senna-docusate (SENOKOT-S) 8.6-50 MG tablet Take 1 tablet by mouth daily.   tamsulosin  (FLOMAX ) 0.4 MG CAPS capsule Take 1 capsule (0.4 mg total) by mouth every morning.   TURMERIC PO Take 1,000 mg by mouth 2 (two) times daily.   vitamin C (ASCORBIC ACID) 500 MG tablet Take 1,000 mg by mouth 2 (two) times daily.   No facility-administered encounter medications on file as of 06/23/2023.    Allergies (verified) Patient has no known allergies.   History: Past Medical History:  Diagnosis Date   Acid reflux    Blood transfusion without reported diagnosis    Cardiac arrest (HCC)    a. occuring in 12/2016. LAD disease but no targets for CABG or PCI. Underwent ICD placement as EF 15-20%.    CHF (congestive heart failure) (HCC)    Coronary artery disease    Diabetes mellitus without complication (HCC)    Diabetic nephropathy (HCC)    Hyperlipidemia    Hypertension    Myocardial infarction [facility])    Renal disorder    Past Surgical History:  Procedure Laterality Date   BIV ICD INSERTION CRT-D N/A 01/01/2017   Procedure: BIV ICD INSERTION CRT-D;  Surgeon: Lei Pump, MD;  Location: [facility] INVASIVE CV LAB;  Service: Cardiovascular;  Laterality: N/A;   Family History  Problem Relation Age of Onset   Hypertension Mother    Hyperlipidemia Mother    CAD Sister    Diabetes Sister    Hyperlipidemia Sister    Hypertension Sister    CAD Brother    Diabetes Brother    Hypertension Brother    Hyperlipidemia Brother    CAD Brother    Diabetes Brother    Hypertension Brother    Hyperlipidemia Brother    Dementia Brother    Thyroid  disease Daughter    Hypertension Daughter    Diabetes Daughter    Hyperlipidemia Daughter    Fibromyalgia Daughter    Heart disease Daughter        stents   Multiple sclerosis Daughter    Thyroid  disease Daughter    Social History   Socioeconomic History   Marital status: Married     Spouse name: johnna   Number of children: 3   Years of education: 12   Highest education level: Some college, no degree  Occupational History   Occupation: Health and safety inspector   retired   Occupation: Education officer, environmental  Tobacco Use   Smoking status: Former    Current packs/day: 0.00    Types: Cigarettes    Quit date: 1960    Years since quitting: 65.4    Passive exposure: Never   Smokeless tobacco: Never   Tobacco comments:    2-3 years as a teenager  Vaping Use   Vaping status: Never Used  Substance and Sexual Activity   Alcohol use: No   Drug use: No   Sexual activity: Not Currently  Other Topics Concern   Not on file  Social History Narrative   Lives with with wife Earleen Glazier   Married 60 years   Daughter Freida Jes stays frequently   Granddaughter Clide Dalton near by   Social Drivers of [employer]: Low Risk  (06/23/2023)   Overall Financial Resource Strain (CARDIA)    Difficulty of Paying Living Expenses: Not hard at all  Food Insecurity: No Food Insecurity (06/23/2023)   Hunger Vital Sign    Worried About Running Out of Food in the Last Year: Never true    Ran Out of Food in the Last Year: Never true  Transportation Needs: No Transportation Needs (06/23/2023)   PRAPARE - Administrator, Civil Service (Medical): No    Lack of Transportation (Non-Medical): No  Physical Activity: Insufficiently Active (06/23/2023)   Exercise Vital Sign    Days of Exercise per Week: 7 days    Minutes of Exercise per Session: 20 min  Stress: No Stress Concern Present (06/23/2023)   [employer] of Occupational Health - Occupational Stress Questionnaire    Feeling of Stress : Not at all  Social Connections: Socially Integrated (06/23/2023)   Social Connection and Isolation Panel [NHANES]    Frequency of Communication with Friends and Family: More than three times a week    Frequency of Social Gatherings with Friends and Family: More than three times a week    Attends  Religious Services: More than 4 times per year    Active Member of [employer] or Organizations: Yes    Attends Engineer, structural: More than 4 times per year    Marital Status: Married    Tobacco Counseling Counseling given: Yes Tobacco comments: 2-3 years as a teenager    Clinical Intake:  Pre-visit preparation completed: Yes  Pain : No/denies pain     BMI - recorded: 25.39 Nutritional Status: BMI 25 -29 Overweight Nutritional Risks: None Diabetes: Yes CBG done?: Yes (82/had some chocolate milk)  Lab Results  Component Value Date   HGBA1C 6.8 (H) 04/28/2023   HGBA1C 6.6 (H) 01/29/2023   HGBA1C 6.6 (H) 10/27/2022     How often do you need to have someone help you when you read instructions, pamphlets, or other written materials from your doctor or pharmacy?: 1 - Never  Interpreter Needed?: No  Information entered by :: Alia T/cma   Activities of Daily Living     06/23/2023    1:36 PM  In your present state of health, do you have any difficulty performing the following activities:  Hearing? 0  Vision? 0  Comment pt goes to [facility] in [place], [place]  Difficulty concentrating or making decisions? 0  Walking or climbing stairs? 0  Dressing or bathing? 0  Doing errands, shopping? 0  Preparing Food and eating ? N  Using the Toilet? N  In the past six months, have you accidently leaked urine? N  Do you have problems with loss of bowel control? Y  Comment pt had watery stool about a wk ago/sug making an appt/ go [facility] pt aware  Managing your Medications? N  Managing your Finances? N  Housekeeping or managing your Housekeeping? N    Patient Care Team: Galvin Jules, FNP as PCP - General (Family Medicine) Amanda Jungling Joyceann No, MD as PCP - Cardiology (Cardiology) Mealor, Melton Squires  E, MD as PCP - Electrophysiology (Cardiology) Horace Lye, OD (Optometry) [place] Breen., NP as Nurse Practitioner (Cardiology) Jane Meager, MD as Consulting  Physician (Nephrology) Homero Luster, MD as Attending Physician (Urology) Alanda Allegra, MD as Consulting Physician (General Surgery) Delilah Fend, [facility] as Pharmacist (Family Medicine)  Indicate any recent Medical Services you may have received from other than [facility] providers in the past year (date may be approximate).     Assessment:    This is a routine wellness examination for Omar Williams.  Hearing/Vision screen Hearing Screening - Comments:: Pt denies hearing dif Vision Screening - Comments:: Pt denies vision dif/pt goes to [facility] in [place], [place]   Goals Addressed             This Visit's Progress    Patient Stated       Planning another trip to [facility]       Depression Screen     06/23/2023    1:50 PM 04/28/2023    8:01 AM 01/29/2023    8:28 AM 10/27/2022    8:09 AM 07/21/2022   10:38 AM 06/22/2022    3:25 PM 04/29/2022    9:09 AM  PHQ 2/9 Scores  PHQ - 2 Score 0 0 0 0 0 0 0  PHQ- 9 Score 0 0 0 0 0 0 0    Fall Risk     06/23/2023    1:43 PM 04/28/2023    8:01 AM 01/29/2023    8:28 AM 10/27/2022    8:09 AM 07/21/2022   10:38 AM  Fall Risk   Falls in the past year? 0 0 0 1 0  Number falls in past yr: 0   0   Injury with Fall? 0   1   Risk for fall due to : No Fall Risks No Fall Risks No Fall Risks    Follow up Falls evaluation completed Falls evaluation completed Falls evaluation completed Falls prevention discussed     MEDICARE RISK AT HOME:  Medicare Risk at Home Any stairs in or around the home?: No If so, are there any without handrails?: No Home free of loose throw rugs in walkways, pet beds, electrical cords, etc?: Yes Adequate lighting in your home to reduce risk of falls?: Yes Life alert?: No Use of a cane, walker or w/c?: No Grab bars in the bathroom?: No Shower chair or bench in shower?: Yes Elevated toilet seat or a handicapped toilet?: Yes  TIMED UP AND GO:  Was the test performed?  no  Cognitive Function: 6CIT completed        06/23/2023     1:53 PM 06/22/2022    3:26 PM 06/16/2021    3:44 PM 06/12/2020    1:19 PM 06/02/2019    9:22 AM  6CIT Screen  What Year? 0 points 0 points 0 points 0 points 0 points  What month? 0 points 0 points 0 points 0 points 0 points  What time? 0 points 0 points 0 points 0 points 0 points  Count back from 20 0 points 0 points 0 points 0 points 0 points  Months in reverse 0 points 0 points 2 points 0 points 4 points  Repeat phrase 0 points 0 points 0 points 2 points 4 points  Total Score 0 points 0 points 2 points 2 points 8 points    Immunizations Immunization History  Administered Date(s) Administered   Fluad Quad(high Dose 65+) 10/17/2018, 10/02/2019   Fluad Trivalent(High  Dose 65+) 10/27/2022   Influenza, High Dose Seasonal PF 10/31/2016   Influenza-Unspecified 12/04/2018, 11/06/2020   PNEUMOCOCCAL CONJUGATE-20 11/06/2020   Pneumococcal Conjugate-13 02/14/2015   Pneumococcal Polysaccharide-23 02/02/2006, 08/26/2017   Td 08/26/2017   Td (Adult),5 Lf Tetanus Toxid, Preservative Free 08/26/2017   Tdap 10/13/2011, 08/26/2017, 09/13/2018   Zoster Recombinant(Shingrix ) 04/15/2021, 07/17/2021    Screening Tests Health Maintenance  Topic Date Due   COVID-19 Vaccine (1 - 2024-25 season) 07/08/2024 (Originally 10/04/2022)   INFLUENZA VACCINE  09/03/2023   FOOT EXAM  10/27/2023   HEMOGLOBIN A1C  10/29/2023   OPHTHALMOLOGY EXAM  11/05/2023   Medicare Annual Wellness (AWV)  06/22/2024   DTaP/Tdap/Td (6 - Td or Tdap) 09/12/2028   Pneumonia Vaccine 71+ Years old  Completed   Zoster Vaccines- Shingrix   Completed   HPV VACCINES  Aged Out   Meningococcal B Vaccine  Aged Out    Health Maintenance  There are no preventive care reminders to display for this patient.  Health Maintenance Items Addressed: See Nurse Notes  Additional Screening:  Vision Screening: Recommended annual ophthalmology exams for early detection of glaucoma and other disorders of the eye.  Dental Screening:  Recommended annual dental exams for proper oral hygiene  Community Resource Referral / Chronic Care Management: CRR required this visit?  No   CCM required this visit?  No   Plan:    I have personally reviewed and noted the following in the patient's chart:   Medical and social history Use of alcohol, tobacco or illicit drugs  Current medications and supplements including opioid prescriptions. Patient is not currently taking opioid prescriptions. Functional ability and status Nutritional status Physical activity Advanced directives List of other physicians Hospitalizations, surgeries, and ER visits in previous 12 months Vitals Screenings to include cognitive, depression, and falls Referrals and appointments  In addition, I have reviewed and discussed with patient certain preventive protocols, quality metrics, and best practice recommendations. A written personalized care plan for preventive services as well as general preventive health recommendations were provided to patient.   Michaelle Adolphus, CMA   06/23/2023   After Visit Summary: (MyChart) Due to this being a telephonic visit, the after visit summary with patients personalized plan was offered to patient via MyChart   Notes: Please refer to Routing Comments.pt is also aware due for diabetic eye exam, however pt usually go around Sept. Will make appt then.

## 2023-06-23 NOTE — Patient Instructions (Signed)
 Omar Williams , Thank you for taking time out of your busy schedule to complete your Annual Wellness Visit with me. I enjoyed our conversation and look forward to speaking with you again next year. I, as well as your care team,  appreciate your ongoing commitment to your health goals. Please review the following plan we discussed and let me know if I can assist you in the future. Your Game plan/ To Do List    Follow up Visits: Next Medicare AWV with our clinical staff: Friday, 06/23/24 at 2:30p.m.   Have you seen your provider in the last 6 months (3 months if uncontrolled diabetes)? No Next Office Visit with your provider: 08/03/2023 at 9:05a.m.  Clinician Recommendations:  Aim for 30 minutes of exercise or brisk walking, 6-8 glasses of water, and 5 servings of fruits and vegetables each day. N/a      This is a list of the screening recommended for you and due dates:  Health Maintenance  Topic Date Due   COVID-19 Vaccine (1 - 2024-25 season) 07/08/2024*   Flu Shot  09/03/2023   Complete foot exam   10/27/2023   Hemoglobin A1C  10/29/2023   Eye exam for diabetics  11/05/2023   Medicare Annual Wellness Visit  06/22/2024   DTaP/Tdap/Td vaccine (6 - Td or Tdap) 09/12/2028   Pneumonia Vaccine  Completed   Zoster (Shingles) Vaccine  Completed   HPV Vaccine  Aged Out   Meningitis B Vaccine  Aged Out  *Topic was postponed. The date shown is not the original due date.    Advanced directives: (In Chart) A copy of your advanced directives are scanned into your chart should your provider ever need it. Advance Care Planning is important because it:  [x]  Makes sure you receive the medical care that is consistent with your values, goals, and preferences  [x]  It provides guidance to your family and loved ones and reduces their decisional burden about whether or not they are making the right decisions based on your wishes.  Follow the link provided in your after visit summary or read over the paperwork  we have mailed to you to help you started getting your Advance Directives in place. If you need assistance in completing these, please reach out to us  so that we can help you!  See attachments for Preventive Care and Fall Prevention Tips.

## 2023-06-29 ENCOUNTER — Telehealth: Payer: Self-pay

## 2023-06-29 ENCOUNTER — Ambulatory Visit: Payer: Self-pay

## 2023-06-29 DIAGNOSIS — N183 Chronic kidney disease, stage 3 unspecified: Secondary | ICD-10-CM

## 2023-06-29 DIAGNOSIS — E1169 Type 2 diabetes mellitus with other specified complication: Secondary | ICD-10-CM

## 2023-06-29 DIAGNOSIS — I5022 Chronic systolic (congestive) heart failure: Secondary | ICD-10-CM

## 2023-06-29 DIAGNOSIS — E1122 Type 2 diabetes mellitus with diabetic chronic kidney disease: Secondary | ICD-10-CM

## 2023-06-29 NOTE — Telephone Encounter (Signed)
 Copied from CRM [id number]. Topic: Clinical - Prescription Issue >> Jun 29, 2023  3:58 PM Tiffany H wrote: Reason for CRM: Patient called for assistance with Farxiga . Patient advised that he's down to his last 7 pills. Current prescription reflects 5 90 prescriptions on fill. Patient advised that Stana Ear gets them for him at a savings. Please assist patient.   Patient advised that he is going on vacation on the 11th. Please assist.

## 2023-06-29 NOTE — Telephone Encounter (Signed)
 Call disconnected upon transfer. Phone system issues not allowing this Clinical research associate to call out. Placing in call back folder for further attempts.   Copied from CRM [id number]. Topic: Clinical - Red Word Triage >> Jun 29, 2023  4:04 PM Tiffany H wrote: Red Word that prompted transfer to Nurse Triage: Patient called in with a watery stool. Started over weekend. Please assist.

## 2023-06-29 NOTE — Telephone Encounter (Signed)
 Chief Complaint: Diarrhea 2-3 weeks Symptoms: watery stool Frequency: 2-3 weeks Pertinent Negatives: Patient denies abdominal pain, fever, blood in stool Disposition: [] ED /[] Urgent Care (no appt availability in office) / [x] Appointment(In office/virtual)/ []  [facility]/ [] Home Care/ [] Refused Recommended Disposition /[]  Mobile Bus/ []  Follow-up with PCP Additional Notes: Patient reporting watery/diarrhea for 2-3 weeks. Patient questioning if he needs antibiotics. Patient denies abdominal pain, blood in stool, fever. Patient appt made for evaluation.    Copied from CRM [id number]. Topic: Clinical - Medical Advice >> Jun 29, 2023  4:14 PM Tiffany S wrote: Reason for CRM: Please reach out to patient call kept dropping patient was on the phone with a nurse and call dropped patient called back but unable to transfer call keeps hanging up Reason for Disposition  [1] Mild diarrhea (e.g., 1-3 or more stools than normal in past 24 hours) without known cause AND [2] present >  7 days  Answer Assessment - Initial Assessment Questions 1. DIARRHEA SEVERITY: "How bad is the diarrhea?" "How many more stools have you had in the past 24 hours than normal?"    - NO DIARRHEA (SCALE 0)   - MILD (SCALE 1-3): Few loose or mushy BMs; increase of 1-3 stools over normal daily number of stools; mild increase in ostomy output.   -  MODERATE (SCALE 4-7): Increase of 4-6 stools daily over normal; moderate increase in ostomy output.   -  SEVERE (SCALE 8-10; OR "WORST POSSIBLE"): Increase of 7 or more stools daily over normal; moderate increase in ostomy output; incontinence.      3-4 2. ONSET: "When did the diarrhea begin?"      2-3 weeks 3. BM CONSISTENCY: "How loose or watery is the diarrhea?"      Watery 4. VOMITING: "Are you also vomiting?" If Yes, ask: "How many times in the past 24 hours?"      No 5. ABDOMEN PAIN: "Are you having any abdomen pain?" If Yes, ask: "What does it feel like?"  (e.g., crampy, dull, intermittent, constant)      No 6. ABDOMEN PAIN SEVERITY: If present, ask: "How bad is the pain?"  (e.g., Scale 1-10; mild, moderate, or severe)   - MILD (1-3): doesn't interfere with normal activities, abdomen soft and not tender to touch    - MODERATE (4-7): interferes with normal activities or awakens from sleep, abdomen tender to touch    - SEVERE (8-10): excruciating pain, doubled over, unable to do any normal activities       No pain 7. ORAL INTAKE: If vomiting, "Have you been able to drink liquids?" "How much liquids have you had in the past 24 hours?"     Yes 8. HYDRATION: "Any signs of dehydration?" (e.g., dry mouth [not just dry lips], too weak to stand, dizziness, new weight loss) "When did you last urinate?"       No 10. ANTIBIOTIC USE: "Are you taking antibiotics now or have you taken antibiotics in the past 2 months?"       No 11. OTHER SYMPTOMS: "Do you have any other symptoms?" (e.g., fever, blood in stool)       No  Protocols used: Diarrhea-A-AH

## 2023-06-29 NOTE — Telephone Encounter (Signed)
 Patient already triaged in a separate encounter.

## 2023-06-29 NOTE — Telephone Encounter (Signed)
 Apt scheduled.

## 2023-06-29 NOTE — Telephone Encounter (Signed)
 1st attempt call back. Left partial voicemail before phone cut out.

## 2023-06-30 ENCOUNTER — Ambulatory Visit (INDEPENDENT_AMBULATORY_CARE_PROVIDER_SITE_OTHER)

## 2023-06-30 ENCOUNTER — Ambulatory Visit: Payer: Medicare Other | Attending: Cardiology | Admitting: Cardiology

## 2023-06-30 ENCOUNTER — Encounter: Payer: Self-pay | Admitting: Family Medicine

## 2023-06-30 ENCOUNTER — Encounter: Payer: Self-pay | Admitting: Cardiology

## 2023-06-30 VITALS — BP 110/60 | HR 60 | Ht 67.0 in | Wt 166.2 lb

## 2023-06-30 VITALS — BP 120/67 | HR 60 | Temp 97.5°F | Ht 67.0 in | Wt 165.4 lb

## 2023-06-30 DIAGNOSIS — I2510 Atherosclerotic heart disease of native coronary artery without angina pectoris: Secondary | ICD-10-CM | POA: Diagnosis not present

## 2023-06-30 DIAGNOSIS — R197 Diarrhea, unspecified: Secondary | ICD-10-CM

## 2023-06-30 DIAGNOSIS — I5032 Chronic diastolic (congestive) heart failure: Secondary | ICD-10-CM | POA: Diagnosis not present

## 2023-06-30 DIAGNOSIS — I10 Essential (primary) hypertension: Secondary | ICD-10-CM | POA: Diagnosis not present

## 2023-06-30 DIAGNOSIS — E782 Mixed hyperlipidemia: Secondary | ICD-10-CM | POA: Diagnosis not present

## 2023-06-30 MED ORDER — DAPAGLIFLOZIN PROPANEDIOL 10 MG PO TABS: 10.0000 mg | ORAL_TABLET | Freq: Every day | ORAL | 5 refills | Status: DC

## 2023-06-30 NOTE — Progress Notes (Signed)
 Clinical Summary Omar Williams is a 88 y.o.male seen today for follow up of the following medical problems.    1. ICM/Chronic systolic HF/CAD - history of LAD disease without target for revasc   - has BiV AICD followed by EP - 06/2017 echo LVEF 40-45%.  - medical therapy has been limited by low bp's. Has had some prior issues with hyperkalemia in the past     - had been on coreg  6.25mg  bid and losartan  25mg  daily at 07/2020 visit. Somewhere betwee June and July meds changed and now just on lisinopril  2.5mg  daily. Im not clear on this history.   Jan 2023 echo: LVEF 50%, grade I dd - he is on lisinopril , jardiance    - no SOB/DOE, no significant leg edema - compliant with meds - 06/2023 normal device check         2. HTN - he is compliant with meds     3. Hyperlipidemia  04/2021 TC 133 TG 78 HDL 57 LDL 61 - 10/2021 TC 409 TG 81 HDL 63 LDL 84 01/2022 TC 151 TG 100 HDL 61 LDL 72 - upcoming labs with pcp   4. CKD - followed by Dr Carrolyn Clan   5. Afib -isoalted episode noted on device check without recurrence, has not been committed to anticoag at this time. Followed by EP Past Medical History:  Diagnosis Date   Acid reflux    Blood transfusion without reported diagnosis    Cardiac arrest (HCC)    a. occuring in 12/2016. LAD disease but no targets for CABG or PCI. Underwent ICD placement as EF 15-20%.    CHF (congestive heart failure) (HCC)    Coronary artery disease    Diabetes mellitus without complication (HCC)    Diabetic nephropathy (HCC)    Hyperlipidemia    Hypertension    Myocardial infarction [facility] [place])    Renal disorder      No Known Allergies   Current Outpatient Medications  Medication Sig Dispense Refill   aspirin  81 MG chewable tablet Chew 81 mg by mouth daily.     Bee Pollen 550 MG CAPS Take 2 capsules by mouth 2 (two) times daily.     chlorhexidine  (PERIDEX ) 0.12 % solution as needed.     Cholecalciferol (VITAMIN D3) 125 MCG (5000 UT) TABS Take 1 tablet  by mouth daily.      CINNAMON PO Take 2,000 mg by mouth in the morning and at bedtime.     cyanocobalamin (VITAMIN B12) 1000 MCG tablet Take 1,000 mcg by mouth 2 (two) times daily.     dapagliflozin  propanediol (FARXIGA ) 10 MG TABS tablet Take 1 tablet (10 mg total) by mouth daily before breakfast. 90 tablet 5   ezetimibe  (ZETIA ) 10 MG tablet TAKE 1 TABLET BY MOUTH DAILY 90 tablet 3   finasteride  (PROSCAR ) 5 MG tablet Take 1 tablet (5 mg total) by mouth daily. 90 tablet 1   furosemide  (LASIX ) 20 MG tablet TAKE 1 TABLET BY MOUTH ONCE DAILY AS NEEDED FOR EDEMA. 90 tablet 0   glimepiride  (AMARYL ) 4 MG tablet Take 1 tablet (4 mg total) by mouth in the morning and at bedtime. 180 tablet 3   glucose blood (ONETOUCH ULTRA) test strip Test BS daily Dx E11.9 100 strip 3   lisinopril  (ZESTRIL ) 2.5 MG tablet Take 1 tablet (2.5 mg total) by mouth daily. 90 tablet 1   nitroGLYCERIN  (NITROSTAT ) 0.4 MG SL tablet Place 1 tablet (0.4 mg total) under the tongue every  5 (five) minutes x 3 doses as needed for chest pain. 25 tablet 3   Omega-3 Fatty Acids (FISH OIL) 1200 MG CAPS Take 1 capsule by mouth 2 (two) times daily.      oxybutynin  (DITROPAN ) 5 MG tablet Take 0.5 tablets (2.5 mg total) by mouth 3 (three) times daily. 135 tablet 3   pantoprazole  (PROTONIX ) 40 MG tablet TAKE 1 TABLET BY MOUTH DAILY 90 tablet 1   rosuvastatin  (CRESTOR ) 40 MG tablet TAKE 1 TABLET BY MOUTH DAILY 90 tablet 2   senna-docusate (SENOKOT-S) 8.6-50 MG tablet Take 1 tablet by mouth daily.     tamsulosin  (FLOMAX ) 0.4 MG CAPS capsule Take 1 capsule (0.4 mg total) by mouth every morning. 90 capsule 3   TURMERIC PO Take 1,000 mg by mouth 2 (two) times daily.     vitamin C (ASCORBIC ACID) 500 MG tablet Take 1,000 mg by mouth 2 (two) times daily.     No current facility-administered medications for this visit.     Past Surgical History:  Procedure Laterality Date   BIV ICD INSERTION CRT-D N/A 01/01/2017   Procedure: BIV ICD INSERTION  CRT-D;  Surgeon: Lei Pump, MD;  Location: [facility] INVASIVE CV LAB;  Service: Cardiovascular;  Laterality: N/A;     No Known Allergies    Family History  Problem Relation Age of Onset   Hypertension Mother    Hyperlipidemia Mother    CAD Sister    Diabetes Sister    Hyperlipidemia Sister    Hypertension Sister    CAD Brother    Diabetes Brother    Hypertension Brother    Hyperlipidemia Brother    CAD Brother    Diabetes Brother    Hypertension Brother    Hyperlipidemia Brother    Dementia Brother    Thyroid  disease Daughter    Hypertension Daughter    Diabetes Daughter    Hyperlipidemia Daughter    Fibromyalgia Daughter    Heart disease Daughter        stents   Multiple sclerosis Daughter    Thyroid  disease Daughter      Social History Mr. Caloca reports that he quit smoking about 65 years ago. His smoking use included cigarettes. He has never been exposed to tobacco smoke. He has never used smokeless tobacco. Mr. Korb reports no history of alcohol use.    Physical Examination Today's Vitals   06/30/23 1036  BP: 110/60  Pulse: 60  SpO2: 96%  Weight: 166 lb 3.2 oz (75.4 kg)  Height: 5\' 7"  (1.702 m)   Body mass index is 26.03 kg/m.  Gen: resting comfortably, no acute distress HEENT: no scleral icterus, pupils equal round and reactive, no palptable cervical adenopathy,  CV: RRR, no m/rg, no jvd Resp: Clear to auscultation bilaterally GI: abdomen is soft, non-tender, non-distended, normal bowel sounds, no hepatosplenomegaly MSK: extremities are warm, no edema.  Skin: warm, no rash Neuro:  no focal deficits Psych: appropriate affect   Diagnostic Studies  Echo 06/09/17:   Study Conclusions   - Left ventricle: The cavity size was normal. Wall thickness was   normal. Systolic function was mildly to moderately reduced. The   estimated ejection fraction was in the range of 40% to 45%.   Diffuse hypokinesis. Doppler parameters are consistent with    abnormal left ventricular relaxation (grade 1 diastolic   dysfunction). - Aortic valve: Poorly visualized. Probably trileaflet. There was   mild regurgitation. - Mitral valve: There was trivial regurgitation. - Left atrium:  The atrium was mildly dilated. - Right ventricle: Pacer wire or catheter noted in right ventricle. - Right atrium: Central venous pressure (est): 3 mm Hg. - Atrial septum: No defect or patent foramen ovale was identified. - Tricuspid valve: There was trivial regurgitation. - Pulmonary arteries: PA peak pressure: 13 mm Hg (S). - Pericardium, extracardiac: There was no pericardial effusion.   Impressions:   - Images are limited, but LVEF has improved in comparison to the   previous study in November 2018.   06/2017 echo Study Conclusions   - Left ventricle: The cavity size was normal. Wall thickness was    normal. Systolic function was mildly to moderately reduced. The    estimated ejection fraction was in the range of 40% to 45%.    Diffuse hypokinesis. Doppler parameters are consistent with    abnormal left ventricular relaxation (grade 1 diastolic    dysfunction).  - Aortic valve: Poorly visualized. Probably trileaflet. There was    mild regurgitation.  - Mitral valve: There was trivial regurgitation.  - Left atrium: The atrium was mildly dilated.  - Right ventricle: Pacer wire or catheter noted in right ventricle.  - Right atrium: Central venous pressure (est): 3 mm Hg.  - Atrial septum: No defect or patent foramen ovale was identified.  - Tricuspid valve: There was trivial regurgitation.  - Pulmonary arteries: PA peak pressure: 13 mm Hg (S).  - Pericardium, extracardiac: There was no pericardial effusion.   Jan 2023 echo 1. Left ventricular ejection fraction, by estimation, is 50%. The left  ventricle has low normal function. The left ventricle has no regional wall  motion abnormalities. There is moderate left ventricular hypertrophy. Left  ventricular  diastolic parameters   are consistent with Grade I diastolic dysfunction (impaired relaxation).   2. Right ventricular systolic function is normal. The right ventricular  size is normal. There is normal pulmonary artery systolic pressure.   3. Left atrial size was moderately dilated.   4. The mitral valve is normal in structure. No evidence of mitral valve  regurgitation. No evidence of mitral stenosis.   5. The tricuspid valve is abnormal.   6. The aortic valve was not well visualized. Aortic valve regurgitation  is mild.   7. The inferior vena cava is normal in size with greater than 50%  respiratory variability, suggesting right atrial pressure of 3 mmHg.            Assessment and Plan   1. CAD - denies any recent symptoms, continue current meds   2.Chronic HFimpEF - most recent echo shows LVEF has normalized at 50% -  At some point beta blocker was stopped by another provider, I am unclear on history. - historically medical therapy limited by low bp's, renal dysfunction - no symptoms, continue current meds   3. Hyperlipidemia -upcoming labs with pcp, continue current meds   4. HTN - at goal, continue current meds     Laurann Pollock, M.D.

## 2023-06-30 NOTE — Telephone Encounter (Signed)
 Can we confirm patient is enrolled in [place] PAP for Farxiga ? Refills sent to [facility] today Thank you!

## 2023-06-30 NOTE — Patient Instructions (Signed)

## 2023-06-30 NOTE — Progress Notes (Signed)
 Subjective:  Patient ID: Omar Williams, male    DOB: 1936-01-20, 88 y.o.   MRN: [id number]  Patient Care Team: Galvin Jules, FNP as PCP - General (Family Medicine) Amanda Jungling, Joyceann No, MD as PCP - Cardiology (Cardiology) Mealor, Donnamae Gaba, MD as PCP - Electrophysiology (Cardiology) Horace Lye, OD (Optometry) [place] Breen., NP as Nurse Practitioner (Cardiology) Jane Meager, MD as Consulting Physician (Nephrology) Homero Luster, MD as Attending Physician (Urology) Alanda Allegra, MD as Consulting Physician (General Surgery) Delilah Fend, [facility] as Pharmacist (Family Medicine)   Chief Complaint:  Diarrhea (3 or more weeks, "watery stool")   HPI: Omar Williams is a 88 y.o. male presenting on 06/30/2023 for Diarrhea (3 or more weeks, "watery stool")  Diarrhea    States that he went to [place]  a few weeks ago and started having watery stools. He is leaving on 07/24/23 for the beach for a week and would like to feel better by then. Denies stomach pain, headache, nausea, vomiting. Denies fever, chills. Has 3-4 stools per day. Denies blood in stool. States that it is light brown.  States that it is still watery. Tried increasing his fiber and that has helped some. He also tried stool softener and states that helped form his stool. Has not taken anything in 3-4 days. States that he is staying hydrated and able to eat.   Relevant past medical, surgical, family, and social history reviewed and updated as indicated.  Allergies and medications reviewed and updated. Data reviewed: Chart in Epic.   Past Medical History:  Diagnosis Date   Acid reflux    Blood transfusion without reported diagnosis    Cardiac arrest (HCC)    a. occuring in 12/2016. LAD disease but no targets for CABG or PCI. Underwent ICD placement as EF 15-20%.    CHF (congestive heart failure) (HCC)    Coronary artery disease    Diabetes mellitus without complication (HCC)    Diabetic nephropathy (HCC)     Hyperlipidemia    Hypertension    Myocardial infarction [facility])    Renal disorder     Past Surgical History:  Procedure Laterality Date   BIV ICD INSERTION CRT-D N/A 01/01/2017   Procedure: BIV ICD INSERTION CRT-D;  Surgeon: Lei Pump, MD;  Location: [facility] INVASIVE CV LAB;  Service: Cardiovascular;  Laterality: N/A;    Social History   Socioeconomic History   Marital status: Married    Spouse name: johnna   Number of children: 3   Years of education: 12   Highest education level: Some college, no degree  Occupational History   Occupation: Health and safety inspector   retired   Occupation: Education officer, environmental  Tobacco Use   Smoking status: Former    Current packs/day: 0.00    Types: Cigarettes    Quit date: 1960    Years since quitting: 65.4    Passive exposure: Never   Smokeless tobacco: Never   Tobacco comments:    2-3 years as a teenager  Vaping Use   Vaping status: Never Used  Substance and Sexual Activity   Alcohol use: No   Drug use: No   Sexual activity: Not Currently  Other Topics Concern   Not on file  Social History Narrative   Lives with with wife Earleen Glazier   Married 60 years   Daughter Freida Jes stays frequently   Granddaughter Clide Dalton near by   Social Drivers of Health   Financial Resource Strain: Low Risk  (06/23/2023)  Overall Financial Resource Strain (CARDIA)    Difficulty of Paying Living Expenses: Not hard at all  Food Insecurity: No Food Insecurity (06/23/2023)   Hunger Vital Sign    Worried About Running Out of Food in the Last Year: Never true    Ran Out of Food in the Last Year: Never true  Transportation Needs: No Transportation Needs (06/23/2023)   PRAPARE - Administrator, Civil Service (Medical): No    Lack of Transportation (Non-Medical): No  Physical Activity: Insufficiently Active (06/23/2023)   Exercise Vital Sign    Days of Exercise per Week: 7 days    Minutes of Exercise per Session: 20 min  Stress: No Stress Concern Present  (06/23/2023)   [employer] of Occupational Health - Occupational Stress Questionnaire    Feeling of Stress : Not at all  Social Connections: Socially Integrated (06/23/2023)   Social Connection and Isolation Panel [NHANES]    Frequency of Communication with Friends and Family: More than three times a week    Frequency of Social Gatherings with Friends and Family: More than three times a week    Attends Religious Services: More than 4 times per year    Active Member of [employer] or Organizations: Yes    Attends Engineer, structural: More than 4 times per year    Marital Status: Married  Catering manager Violence: Not At Risk (06/23/2023)   Humiliation, Afraid, Rape, and Kick questionnaire    Fear of Current or Ex-Partner: No    Emotionally Abused: No    Physically Abused: No    Sexually Abused: No    Outpatient Encounter Medications as of 06/30/2023  Medication Sig   aspirin  81 MG chewable tablet Chew 81 mg by mouth daily.   Bee Pollen 550 MG CAPS Take 2 capsules by mouth 2 (two) times daily.   chlorhexidine  (PERIDEX ) 0.12 % solution as needed.   Cholecalciferol (VITAMIN D3) 125 MCG (5000 UT) TABS Take 1 tablet by mouth daily.    CINNAMON PO Take 2,000 mg by mouth in the morning and at bedtime.   cyanocobalamin (VITAMIN B12) 1000 MCG tablet Take 1,000 mcg by mouth 2 (two) times daily.   dapagliflozin  propanediol (FARXIGA ) 10 MG TABS tablet Take 1 tablet (10 mg total) by mouth daily before breakfast.   ezetimibe  (ZETIA ) 10 MG tablet TAKE 1 TABLET BY MOUTH DAILY   finasteride  (PROSCAR ) 5 MG tablet Take 1 tablet (5 mg total) by mouth daily.   furosemide  (LASIX ) 20 MG tablet TAKE 1 TABLET BY MOUTH ONCE DAILY AS NEEDED FOR EDEMA. (Patient taking differently: Take 20 mg by mouth daily.)   glimepiride  (AMARYL ) 4 MG tablet Take 1 tablet (4 mg total) by mouth in the morning and at bedtime.   glucose blood (ONETOUCH ULTRA) test strip Test BS daily Dx E11.9   lisinopril  (ZESTRIL ) 2.5 MG  tablet Take 1 tablet (2.5 mg total) by mouth daily.   nitroGLYCERIN  (NITROSTAT ) 0.4 MG SL tablet Place 1 tablet (0.4 mg total) under the tongue every 5 (five) minutes x 3 doses as needed for chest pain.   Omega-3 Fatty Acids (FISH OIL) 1200 MG CAPS Take 1 capsule by mouth 2 (two) times daily.    oxybutynin  (DITROPAN ) 5 MG tablet Take 0.5 tablets (2.5 mg total) by mouth 3 (three) times daily.   pantoprazole  (PROTONIX ) 40 MG tablet TAKE 1 TABLET BY MOUTH DAILY   rosuvastatin  (CRESTOR ) 40 MG tablet TAKE 1 TABLET BY MOUTH DAILY   senna-docusate (  SENOKOT-S) 8.6-50 MG tablet Take 1 tablet by mouth daily.   tamsulosin  (FLOMAX ) 0.4 MG CAPS capsule Take 1 capsule (0.4 mg total) by mouth every morning.   TURMERIC PO Take 1,000 mg by mouth 2 (two) times daily.   vitamin C (ASCORBIC ACID) 500 MG tablet Take 1,000 mg by mouth 2 (two) times daily.   No facility-administered encounter medications on file as of 06/30/2023.    No Known Allergies  Review of Systems  Gastrointestinal:  Positive for diarrhea.   Objective:  BP 120/67   Pulse 60   Temp (!) 97.5 F (36.4 C)   Ht 5\' 7"  (1.702 m)   Wt 165 lb 6.4 oz (75 kg)   SpO2 94%   BMI 25.91 kg/m    Wt Readings from Last 3 Encounters:  06/30/23 165 lb 6.4 oz (75 kg)  06/30/23 166 lb 3.2 oz (75.4 kg)  06/23/23 167 lb (75.8 kg)    Physical Exam Constitutional:      General: He is awake. He is not in acute distress.    Appearance: Normal appearance. He is well-groomed. He is not ill-appearing, toxic-appearing or diaphoretic.  Cardiovascular:     Rate and Rhythm: Normal rate and regular rhythm.     Heart sounds: Normal heart sounds.  Pulmonary:     Effort: Pulmonary effort is normal.     Breath sounds: Normal breath sounds.  Abdominal:     General: Abdomen is flat. Bowel sounds are normal. There is no distension.     Palpations: Abdomen is soft. There is no shifting dullness, fluid wave, mass or pulsatile mass.     Tenderness: There is no  abdominal tenderness. There is no guarding or rebound.     Hernia: No hernia is present.  Skin:    General: Skin is warm.     Capillary Refill: Capillary refill takes less than 2 seconds.  Neurological:     General: No focal deficit present.     Mental Status: He is alert, oriented to person, place, and time and easily aroused. Mental status is at baseline.     Cranial Nerves: Cranial nerves 2-12 are intact.  Psychiatric:        Attention and Perception: Attention and perception normal.        Mood and Affect: Mood and affect normal.        Speech: Speech normal.        Behavior: Behavior normal. Behavior is cooperative.        Thought Content: Thought content normal.        Cognition and Memory: Cognition and memory normal.     Results for orders placed or performed in visit on 06/04/23  CUP P[facility] DEVICE CHECK   Collection Time: 06/04/23 10:32 AM  Result Value Ref Range   Pulse Generator Manufacturer MERM    Date Time Interrogation Session [id number]    Pulse Gen Model [id number] Claria MRI Quad CRT-D    Pulse Gen Serial Number [id number]    Clinic Name [facility]    Implantable Pulse Generator Type Cardiac Resynch Therapy Defibulator    Implantable Pulse Generator Implant Date [id number]    Implantable Lead Manufacturer [facility]    Implantable Lead Model 4598 Kristi Petties MRI SureScan    Implantable Lead Serial Number [id number]    Implantable Lead Implant Date [id number]    Implantable Lead Location Detail 1 UNKNOWN    Implantable Lead Location [id number]    Implantable Lead Connection Status [id number]  Implantable Lead Manufacturer MERM    Implantable Lead Model 5076 CapSureFix Novus MRI SureScan    Implantable Lead Serial Number [id number]    Implantable Lead Implant Date [id number]    Implantable Lead Location Detail 1 UNKNOWN    Implantable Lead Location [id number]    Implantable Lead Connection Status [phone]    Implantable Lead Manufacturer MERM     Implantable Lead Model 6935 Sprint Quattro Secure S MRI SureScan    Implantable Lead Serial Number [id number]    Implantable Lead Implant Date 19147829    Implantable Lead Location Detail 1 UNKNOWN    Implantable Lead Location [id number]    Implantable Lead Connection Status [id number]    Eval Rhythm AS/VS 53        06/23/2023    1:50 PM 04/28/2023    8:01 AM 01/29/2023    8:28 AM 10/27/2022    8:09 AM 07/21/2022   10:38 AM  Depression screen PHQ 2/9  Decreased Interest 0 0 0 0 0  Down, Depressed, Hopeless 0 0 0 0 0  PHQ - 2 Score 0 0 0 0 0  Altered sleeping 0 0 0 0 0  Tired, decreased energy 0 0 0 0 0  Change in appetite 0 0 0 0 0  Feeling bad or failure about yourself  0 0 0 0 0  Trouble concentrating 0 0 0 0 0  Moving slowly or fidgety/restless 0 0 0 0 0  Suicidal thoughts 0 0 0 0 0  PHQ-9 Score 0 0 0 0 0  Difficult doing work/chores Not difficult at all Not difficult at all Not difficult at all Not difficult at all Not difficult at all       04/28/2023    8:02 AM 01/29/2023    8:28 AM 10/27/2022    8:10 AM 07/21/2022   10:39 AM  GAD 7 : Generalized Anxiety Score  Nervous, Anxious, on Edge 0 0 0 0  Control/stop worrying 0 0 0 0  Worry too much - different things 0 0 0 0  Trouble relaxing 0 0 0 0  Restless 0 0 0 0  Easily annoyed or irritable 0 0 0 0  Afraid - awful might happen 0 0 0 0  Total GAD 7 Score 0 0 0 0  Anxiety Difficulty Not difficult at all Not difficult at all Not difficult at all Not difficult at all   Pertinent labs & imaging results that were available during my care of the patient were reviewed by me and considered in my medical decision making.  Assessment & Plan:  Tracen was seen today for diarrhea.  Diagnoses and all orders for this visit:  Diarrhea, unspecified type Labs as below. Will communicate results to patient once available. Will await results to determine next steps.  Discussed red flag symptoms. Patient to continue to increase hydration and  maintain adequate intake of food.  -     Cdiff NAA+O+P+Stool Culture -     Fecal occult blood, imunochemical; Future -     CBC with Differential/Platelet -     CMP14+EGFR  Continue all other maintenance medications.  Follow up plan: Return if symptoms worsen or fail to improve.   Continue healthy lifestyle choices, including diet (rich in fruits, vegetables, and lean proteins, and low in salt and simple carbohydrates) and exercise (at least 30 minutes of moderate physical activity daily).  Written and verbal instructions provided   The above assessment and management plan was discussed with the patient. The  patient verbalized understanding of and has agreed to the management plan. Patient is aware to call the clinic if they develop any new symptoms or if symptoms persist or worsen. Patient is aware when to return to the clinic for a follow-up visit. Patient educated on when it is appropriate to go to the emergency department.   Jacqualyn Mates, DNP-FNP [facility] [address] [place], [place] [address] [phone]

## 2023-06-30 NOTE — Addendum Note (Signed)
 Addended by: Chakita Mcgraw D on: 06/30/2023 03:04 PM   Modules accepted: Orders

## 2023-07-01 ENCOUNTER — Ambulatory Visit: Payer: Self-pay | Admitting: Family Medicine

## 2023-07-01 LAB — CBC WITH DIFFERENTIAL/PLATELET
Basophils Absolute: 0.1 10*3/uL (ref 0.0–0.2)
Basos: 1 %
EOS (ABSOLUTE): 0.4 10*3/uL (ref 0.0–0.4)
Eos: 5 %
Hematocrit: 47.1 % (ref 37.5–51.0)
Hemoglobin: 15.6 g/dL (ref 13.0–17.7)
Immature Grans (Abs): 0 10*3/uL (ref 0.0–0.1)
Immature Granulocytes: 0 %
Lymphocytes Absolute: 2.3 10*3/uL (ref 0.7–3.1)
Lymphs: 27 %
MCH: 31.1 pg (ref 26.6–33.0)
MCHC: 33.1 g/dL (ref 31.5–35.7)
MCV: 94 fL (ref 79–97)
Monocytes Absolute: 0.9 10*3/uL (ref 0.1–0.9)
Monocytes: 11 %
Neutrophils Absolute: 4.9 10*3/uL (ref 1.4–7.0)
Neutrophils: 56 %
Platelets: 224 10*3/uL (ref 150–450)
RBC: 5.02 x10E6/uL (ref 4.14–5.80)
RDW: 13.7 % (ref 11.6–15.4)
WBC: 8.6 10*3/uL (ref 3.4–10.8)

## 2023-07-01 LAB — CMP14+EGFR
ALT: 21 IU/L (ref 0–44)
AST: 22 IU/L (ref 0–40)
Albumin: 4.5 g/dL (ref 3.7–4.7)
Alkaline Phosphatase: 76 IU/L (ref 44–121)
BUN/Creatinine Ratio: 17 (ref 10–24)
BUN: 23 mg/dL (ref 8–27)
Bilirubin Total: 0.9 mg/dL (ref 0.0–1.2)
CO2: 18 mmol/L — ABNORMAL LOW (ref 20–29)
Calcium: 9.6 mg/dL (ref 8.6–10.2)
Chloride: 106 mmol/L (ref 96–106)
Creatinine, Ser: 1.32 mg/dL — ABNORMAL HIGH (ref 0.76–1.27)
Globulin, Total: 2.7 g/dL (ref 1.5–4.5)
Glucose: 113 mg/dL — ABNORMAL HIGH (ref 70–99)
Potassium: 3.5 mmol/L (ref 3.5–5.2)
Sodium: 142 mmol/L (ref 134–144)
Total Protein: 7.2 g/dL (ref 6.0–8.5)
eGFR: 53 mL/min/{1.73_m2} — ABNORMAL LOW (ref >59)

## 2023-07-02 ENCOUNTER — Other Ambulatory Visit

## 2023-07-02 DIAGNOSIS — R197 Diarrhea, unspecified: Secondary | ICD-10-CM | POA: Diagnosis not present

## 2023-07-06 ENCOUNTER — Other Ambulatory Visit

## 2023-07-06 DIAGNOSIS — R197 Diarrhea, unspecified: Secondary | ICD-10-CM

## 2023-07-07 ENCOUNTER — Telehealth: Payer: Self-pay

## 2023-07-07 ENCOUNTER — Ambulatory Visit (INDEPENDENT_AMBULATORY_CARE_PROVIDER_SITE_OTHER): Payer: Medicare Other

## 2023-07-07 DIAGNOSIS — I255 Ischemic cardiomyopathy: Secondary | ICD-10-CM | POA: Diagnosis not present

## 2023-07-07 DIAGNOSIS — E1169 Type 2 diabetes mellitus with other specified complication: Secondary | ICD-10-CM

## 2023-07-07 DIAGNOSIS — N183 Chronic kidney disease, stage 3 unspecified: Secondary | ICD-10-CM

## 2023-07-07 DIAGNOSIS — I5022 Chronic systolic (congestive) heart failure: Secondary | ICD-10-CM

## 2023-07-07 LAB — CUP PACEART REMOTE DEVICE CHECK
Battery Remaining Longevity: 16 mo
Battery Voltage: 2.89 V
Brady Statistic AP VP Percent: 64.11 %
Brady Statistic AP VS Percent: 0.03 %
Brady Statistic AS VP Percent: 35.45 %
Brady Statistic AS VS Percent: 0.41 %
Brady Statistic RA Percent Paced: 64.07 %
Brady Statistic RV Percent Paced: 98.8 %
Date Time Interrogation Session: 20250604012403
HighPow Impedance: 80 Ohm
Implantable Lead Connection Status: 753985
Implantable Lead Connection Status: 753985
Implantable Lead Connection Status: 753985
Implantable Lead Implant Date: 20181130
Implantable Lead Implant Date: 20181130
Implantable Lead Implant Date: 20181130
Implantable Lead Location: 753858
Implantable Lead Location: 753859
Implantable Lead Location: 753860
Implantable Lead Model: 4598
Implantable Lead Model: 5076
Implantable Lead Model: 6935
Implantable Pulse Generator Implant Date: 20181130
Lead Channel Impedance Value: 156.606
Lead Channel Impedance Value: 160.941
Lead Channel Impedance Value: 168.889
Lead Channel Impedance Value: 174.595
Lead Channel Impedance Value: 180 Ohm
Lead Channel Impedance Value: 304 Ohm
Lead Channel Impedance Value: 323 Ohm
Lead Channel Impedance Value: 342 Ohm
Lead Channel Impedance Value: 342 Ohm
Lead Channel Impedance Value: 380 Ohm
Lead Channel Impedance Value: 399 Ohm
Lead Channel Impedance Value: 494 Ohm
Lead Channel Impedance Value: 532 Ohm
Lead Channel Impedance Value: 570 Ohm
Lead Channel Impedance Value: 589 Ohm
Lead Channel Impedance Value: 627 Ohm
Lead Channel Impedance Value: 627 Ohm
Lead Channel Impedance Value: 646 Ohm
Lead Channel Pacing Threshold Amplitude: 0.375 V
Lead Channel Pacing Threshold Amplitude: 0.625 V
Lead Channel Pacing Threshold Amplitude: 1.25 V
Lead Channel Pacing Threshold Pulse Width: 0.4 ms
Lead Channel Pacing Threshold Pulse Width: 0.4 ms
Lead Channel Pacing Threshold Pulse Width: 0.4 ms
Lead Channel Sensing Intrinsic Amplitude: 10.125 mV
Lead Channel Sensing Intrinsic Amplitude: 10.125 mV
Lead Channel Sensing Intrinsic Amplitude: 2.5 mV
Lead Channel Sensing Intrinsic Amplitude: 2.5 mV
Lead Channel Setting Pacing Amplitude: 1.75 V
Lead Channel Setting Pacing Amplitude: 2 V
Lead Channel Setting Pacing Amplitude: 2.5 V
Lead Channel Setting Pacing Pulse Width: 0.4 ms
Lead Channel Setting Pacing Pulse Width: 0.4 ms
Lead Channel Setting Sensing Sensitivity: 0.3 mV
Zone Setting Status: 755011
Zone Setting Status: 755011

## 2023-07-07 LAB — FECAL OCCULT BLOOD, IMMUNOCHEMICAL: Fecal Occult Bld: NEGATIVE

## 2023-07-07 NOTE — Progress Notes (Signed)
 Pharmacy Medication Assistance Program Note    07/13/2023  Patient ID: Omar Williams, male   DOB: January 17, 1936, 88 y.o.   MRN: [id number]     07/07/2023  Outreach Medication One  Manufacturer Medication One [employer]  [employer] Drugs Farxiga   Type of Radiographer, therapeutic Assistance  Date Application Sent to Prescriber 07/07/2023  Date Application Submitted to Manufacturer 07/13/2023  Method Application Sent to Manufacturer Fax     RENEWAL - submitted.

## 2023-07-08 ENCOUNTER — Ambulatory Visit: Payer: Self-pay | Admitting: Cardiovascular Disease

## 2023-07-12 LAB — CDIFF NAA+O+P+STOOL CULTURE
E coli, Shiga toxin Assay: NEGATIVE
Toxigenic C. Difficile by PCR: NEGATIVE

## 2023-07-13 MED ORDER — DAPAGLIFLOZIN PROPANEDIOL 10 MG PO TABS: 10.0000 mg | ORAL_TABLET | Freq: Every day | ORAL | 5 refills | Status: DC

## 2023-07-13 NOTE — Telephone Encounter (Signed)
 Left detailed message on vm.

## 2023-07-13 NOTE — Telephone Encounter (Signed)
 Renewal submitted. Please send a 90 day RX (with refills) of Farxiga  to [facility] pharmacy for PAP renewal. Thanks!

## 2023-07-13 NOTE — Progress Notes (Signed)
 Negative FOBT and stool cultures. Renal function is stable. CBC normal. Recommend follow up if symptoms continue.

## 2023-07-19 ENCOUNTER — Ambulatory Visit: Attending: Cardiovascular Disease

## 2023-07-19 DIAGNOSIS — Z95810 Presence of automatic (implantable) cardiac defibrillator: Secondary | ICD-10-CM | POA: Diagnosis not present

## 2023-07-19 DIAGNOSIS — I5022 Chronic systolic (congestive) heart failure: Secondary | ICD-10-CM

## 2023-07-19 DIAGNOSIS — K080 Exfoliation of teeth due to systemic causes: Secondary | ICD-10-CM | POA: Diagnosis not present

## 2023-07-20 NOTE — Progress Notes (Signed)
 EPIC Encounter for ICM Monitoring  Patient Name: Omar Williams is a 88 y.o. male Date: 07/20/2023 Primary Care Physican: Galvin Jules, FNP Primary Cardiologist:  Amanda Jungling Electrophysiologist: Mealor Nephrologist: [facility] Bi-V Pacing: 99.2% 09/15/2022 Weight: 162 lbs 03/02/2023 Weight: 165-167 lbs 05/12/2023 Weight: 167 lbs 06/14/2023 Weight: 165-168 lbs 06/30/2023 Office Weight: 166 lbs   Since 07-Jul-2023 Time in AT/AF  0.0 hr/day (0.0%) (No OAC)   Spoke with patient and heart failure questions reviewed.  Transmission results reviewed.  Pt asymptomatic for fluid accumulation.  No fluid symptoms during decreased impedance.  He has beach vacation planned from 6/21-6/30.   Diet:  06/14/2023 He reports he and his wife eat out most of the time due his wife unable to cook.    Optivol Thoracic impedance suggesting possible fluid accumulation from 5/16-5/24, 5/28-5/31 and 6/3-6/11.   Prescribed:  Furosemide  20 mg take 1 tablet as needed for swelling.  Pt takes 1 tablet daily instead of PRN   Labs: 06/30/2023 Creatinine 1.32, BUN 23, Potassium 3.5, Sodium 142, GFR 53 04/28/2023 Creatinine 1.48, BUN 21, Potassium 3.8, Sodium 145, GFR 46 02/19/2023 Creatinine 1.59, BUN 32, Potassium 5.2, Sodium 144, GFR 42  01/29/2023 Creatinine 1.64, BUN 25, Potassium 4.9, Sodium 145, GFR 41  A complete set of results can be found in Results Review.   Recommendations:  Encouraged to limit salt.   No changes and encouraged to call if experiencing any fluid symptoms.   Follow-up plan: ICM clinic phone appointment on 09/06/2023.   91 day device clinic remote transmission 10/06/2023.       EP/Cardiology Office Visits:  Recall 05/29/2024 with Dr Arlester Ladd.   Recall 12/27/2023 with Dr Amanda Jungling.   Copy of ICM check sent to Dr. Arlester Ladd.   3 month ICM trend: 07/19/2023.    12-14 Month ICM trend:     Almyra Jain, RN 07/20/2023 2:19 PM

## 2023-07-21 NOTE — Progress Notes (Signed)
 Pharmacy Medication Assistance Program Note    07/21/2023  Patient ID: Omar Williams, male   DOB: 01/07/1936, 88 y.o.   MRN: [id number]     07/07/2023  Outreach Medication One  Manufacturer Medication One [employer]  [employer] Drugs Farxiga   Dose of Farxiga  10MG   Type of Radiographer, therapeutic Assistance  Date Application Sent to Prescriber 07/07/2023  Date Application Submitted to Manufacturer 07/13/2023  Method Application Sent to Manufacturer Fax  Patient Assistance Determination Approved  Approval End Date 02/02/2024     RENEWAL APPROVED

## 2023-07-22 ENCOUNTER — Other Ambulatory Visit (HOSPITAL_COMMUNITY): Payer: Self-pay

## 2023-08-03 ENCOUNTER — Encounter: Payer: Self-pay | Admitting: Family Medicine

## 2023-08-03 ENCOUNTER — Ambulatory Visit (INDEPENDENT_AMBULATORY_CARE_PROVIDER_SITE_OTHER): Admitting: Family Medicine

## 2023-08-03 VITALS — BP 107/65 | HR 60 | Temp 97.4°F | Ht 67.0 in | Wt 165.2 lb

## 2023-08-03 DIAGNOSIS — N183 Chronic kidney disease, stage 3 unspecified: Secondary | ICD-10-CM

## 2023-08-03 DIAGNOSIS — Z7984 Long term (current) use of oral hypoglycemic drugs: Secondary | ICD-10-CM

## 2023-08-03 DIAGNOSIS — E1122 Type 2 diabetes mellitus with diabetic chronic kidney disease: Secondary | ICD-10-CM

## 2023-08-03 DIAGNOSIS — E1159 Type 2 diabetes mellitus with other circulatory complications: Secondary | ICD-10-CM

## 2023-08-03 DIAGNOSIS — N1832 Chronic kidney disease, stage 3b: Secondary | ICD-10-CM | POA: Diagnosis not present

## 2023-08-03 DIAGNOSIS — E1169 Type 2 diabetes mellitus with other specified complication: Secondary | ICD-10-CM

## 2023-08-03 DIAGNOSIS — I5022 Chronic systolic (congestive) heart failure: Secondary | ICD-10-CM

## 2023-08-03 DIAGNOSIS — Z95811 Presence of heart assist device: Secondary | ICD-10-CM

## 2023-08-03 DIAGNOSIS — I152 Hypertension secondary to endocrine disorders: Secondary | ICD-10-CM

## 2023-08-03 DIAGNOSIS — E785 Hyperlipidemia, unspecified: Secondary | ICD-10-CM

## 2023-08-03 LAB — BAYER DCA HB A1C WAIVED: HB A1C (BAYER DCA - WAIVED): 6.9 % — ABNORMAL HIGH (ref 4.8–5.6)

## 2023-08-03 MED ORDER — DAPAGLIFLOZIN PROPANEDIOL 10 MG PO TABS: 10.0000 mg | ORAL_TABLET | Freq: Every day | ORAL | 5 refills | Status: DC

## 2023-08-03 NOTE — Progress Notes (Signed)
 Subjective:  Patient ID: Omar Williams, male    DOB: 1936-01-06, 88 y.o.   MRN: [id number]  Patient Care Team: Severa Rock HERO, FNP as PCP - General (Family Medicine) Alvan, Dorn FALCON, MD as PCP - Cardiology (Cardiology) Mealor, Eulas BRAVO, MD as PCP - Electrophysiology (Cardiology) Brinda Elsie BRAVO, OD (Optometry) Richarda Prentice LITTIE Mickey., NP as Nurse Practitioner (Cardiology) Rachele Gaynell RAMAN, MD as Consulting Physician (Nephrology) Watt Rush, MD as Attending Physician (Urology) Mavis Anes, MD as Consulting Physician (General Surgery) Billee Mliss BIRCH, [facility] as Pharmacist (Family Medicine)   Chief Complaint:  Diabetes (3 month follow up)   HPI: Omar Williams is a 88 y.o. male presenting on 08/03/2023 for Diabetes (3 month follow up)  History of Present Illness   [name] is an 88 year old male with diabetes and chronic kidney disease who presents for routine follow-up.  Glycemic control - Blood glucose levels stable, ranging from 116 to 187 mg/dL - Takes glimepiride  twice daily - Recently started Farxiga  after a trip - Pharmacy error resulted in receiving only one bottle of Farxiga  instead of two or three - Constant hunger present - No increased thirst or increased urination  Chronic kidney disease - Takes Farxiga  for management - No leg swelling - No blood pressure issues - No headaches - No chest pain - Takes blood pressure medication at night  Heart failure and ICD - No increased leg swelling - No shortness of breath - Cardiologist indicated cardiac function is improving - Scheduled for ICD check in August  Dyslipidemia management - Takes rosuvastatin  - No muscle aches or pains  Upper respiratory symptoms - Runny nose present - Not taking any allergy medications        Relevant past medical, surgical, family, and social history reviewed and updated as indicated.  Allergies and medications reviewed and updated. Data reviewed: Chart in Epic.   Past Medical  History:  Diagnosis Date   Acid reflux    Blood transfusion without reported diagnosis    Cardiac arrest (HCC)    a. occuring in 12/2016. LAD disease but no targets for CABG or PCI. Underwent ICD placement as EF 15-20%.    CHF (congestive heart failure) (HCC)    Coronary artery disease    Diabetes mellitus without complication (HCC)    Diabetic nephropathy (HCC)    Hyperlipidemia    Hypertension    Myocardial infarction [facility])    Renal disorder     Past Surgical History:  Procedure Laterality Date   BIV ICD INSERTION CRT-D N/A 01/01/2017   Procedure: BIV ICD INSERTION CRT-D;  Surgeon: Inocencio Soyla Lunger, MD;  Location: [facility] INVASIVE CV LAB;  Service: Cardiovascular;  Laterality: N/A;    Social History   Socioeconomic History   Marital status: Married    Spouse name: johnna   Number of children: 3   Years of education: 12   Highest education level: Some college, no degree  Occupational History   Occupation: Health and safety inspector   retired   Occupation: Education officer, environmental  Tobacco Use   Smoking status: Former    Current packs/day: 0.00    Types: Cigarettes    Quit date: 1960    Years since quitting: 65.5    Passive exposure: Never   Smokeless tobacco: Never   Tobacco comments:    2-3 years as a teenager  Vaping Use   Vaping status: Never Used  Substance and Sexual Activity   Alcohol use: No   Drug use: No  Sexual activity: Not Currently  Other Topics Concern   Not on file  Social History Narrative   Lives with with wife Omar   Married 60 years   Daughter Dickey Williams stays frequently   Granddaughter Omar Williams near by   Social Drivers of [employer]: Low Risk  (06/23/2023)   Overall Financial Resource Strain (CARDIA)    Difficulty of Paying Living Expenses: Not hard at all  Food Insecurity: No Food Insecurity (06/23/2023)   Hunger Vital Sign    Worried About Running Out of Food in the Last Year: Never true    Ran Out of Food in the Last Year: Never true   Transportation Needs: No Transportation Needs (06/23/2023)   PRAPARE - Administrator, Civil Service (Medical): No    Lack of Transportation (Non-Medical): No  Physical Activity: Insufficiently Active (06/23/2023)   Exercise Vital Sign    Days of Exercise per Week: 7 days    Minutes of Exercise per Session: 20 min  Stress: No Stress Concern Present (06/23/2023)   [employer] of Occupational Health - Occupational Stress Questionnaire    Feeling of Stress : Not at all  Social Connections: Socially Integrated (06/23/2023)   Social Connection and Isolation Panel    Frequency of Communication with Friends and Family: More than three times a week    Frequency of Social Gatherings with Friends and Family: More than three times a week    Attends Religious Services: More than 4 times per year    Active Member of [employer] or Organizations: Yes    Attends Engineer, structural: More than 4 times per year    Marital Status: Married  Catering manager Violence: Not At Risk (06/23/2023)   Humiliation, Afraid, Rape, and Kick questionnaire    Fear of Current or Ex-Partner: No    Emotionally Abused: No    Physically Abused: No    Sexually Abused: No    Outpatient Encounter Medications as of 08/03/2023  Medication Sig   aspirin  81 MG chewable tablet Chew 81 mg by mouth daily.   Bee Pollen 550 MG CAPS Take 2 capsules by mouth 2 (two) times daily.   chlorhexidine  (PERIDEX ) 0.12 % solution as needed.   Cholecalciferol (VITAMIN D3) 125 MCG (5000 UT) TABS Take 1 tablet by mouth daily.    CINNAMON PO Take 2,000 mg by mouth in the morning and at bedtime.   cyanocobalamin (VITAMIN B12) 1000 MCG tablet Take 1,000 mcg by mouth 2 (two) times daily.   ezetimibe  (ZETIA ) 10 MG tablet TAKE 1 TABLET BY MOUTH DAILY   finasteride  (PROSCAR ) 5 MG tablet Take 1 tablet (5 mg total) by mouth daily.   furosemide  (LASIX ) 20 MG tablet TAKE 1 TABLET BY MOUTH ONCE DAILY AS NEEDED FOR EDEMA. (Patient taking  differently: Take 20 mg by mouth daily.)   glimepiride  (AMARYL ) 4 MG tablet Take 1 tablet (4 mg total) by mouth in the morning and at bedtime.   glucose blood (ONETOUCH ULTRA) test strip Test BS daily Dx E11.9   lisinopril  (ZESTRIL ) 2.5 MG tablet Take 1 tablet (2.5 mg total) by mouth daily.   nitroGLYCERIN  (NITROSTAT ) 0.4 MG SL tablet Place 1 tablet (0.4 mg total) under the tongue every 5 (five) minutes x 3 doses as needed for chest pain.   Omega-3 Fatty Acids (FISH OIL) 1200 MG CAPS Take 1 capsule by mouth 2 (two) times daily.    oxybutynin  (DITROPAN ) 5 MG tablet Take 0.5 tablets (  2.5 mg total) by mouth 3 (three) times daily.   pantoprazole  (PROTONIX ) 40 MG tablet TAKE 1 TABLET BY MOUTH DAILY   rosuvastatin  (CRESTOR ) 40 MG tablet TAKE 1 TABLET BY MOUTH DAILY   senna-docusate (SENOKOT-S) 8.6-50 MG tablet Take 1 tablet by mouth daily.   tamsulosin  (FLOMAX ) 0.4 MG CAPS capsule Take 1 capsule (0.4 mg total) by mouth every morning.   TURMERIC PO Take 1,000 mg by mouth 2 (two) times daily.   vitamin C (ASCORBIC ACID) 500 MG tablet Take 1,000 mg by mouth 2 (two) times daily.   [DISCONTINUED] dapagliflozin  propanediol (FARXIGA ) 10 MG TABS tablet Take 1 tablet (10 mg total) by mouth daily before breakfast.   dapagliflozin  propanediol (FARXIGA ) 10 MG TABS tablet Take 1 tablet (10 mg total) by mouth daily before breakfast.   No facility-administered encounter medications on file as of 08/03/2023.    No Known Allergies  Pertinent ROS per HPI, otherwise unremarkable      Objective:  BP 107/65   Pulse 60   Temp (!) 97.4 F (36.3 C)   Ht 5' 7 (1.702 m)   Wt 165 lb 3.2 oz (74.9 kg)   SpO2 92%   BMI 25.87 kg/m    Wt Readings from Last 3 Encounters:  08/03/23 165 lb 3.2 oz (74.9 kg)  06/30/23 165 lb 6.4 oz (75 kg)  06/30/23 166 lb 3.2 oz (75.4 kg)    Physical Exam Vitals and nursing note reviewed.  Constitutional:      General: He is not in acute distress.    Appearance: Normal  appearance. He is normal weight. He is not ill-appearing, toxic-appearing or diaphoretic.  HENT:     Head: Normocephalic and atraumatic.     Nose: Rhinorrhea present. Rhinorrhea is clear.     Mouth/Throat:     Mouth: Mucous membranes are moist.     Pharynx: Postnasal drip present. No pharyngeal swelling, oropharyngeal exudate, posterior oropharyngeal erythema or uvula swelling.   Eyes:     Conjunctiva/sclera: Conjunctivae normal.     Pupils: Pupils are equal, round, and reactive to light.    Cardiovascular:     Rate and Rhythm: Normal rate and regular rhythm.     Heart sounds: Normal heart sounds.  Pulmonary:     Effort: Pulmonary effort is normal.     Breath sounds: Normal breath sounds.  Chest:     Comments: ICD pocket well healed  Musculoskeletal:     Right lower leg: No edema.     Left lower leg: No edema.   Skin:    General: Skin is warm and dry.     Capillary Refill: Capillary refill takes less than 2 seconds.   Neurological:     General: No focal deficit present.     Mental Status: He is alert and oriented to person, place, and time.   Psychiatric:        Mood and Affect: Mood normal.        Behavior: Behavior normal. Behavior is cooperative.        Thought Content: Thought content normal.        Judgment: Judgment normal.      Results for orders placed or performed in visit on 07/07/23  CUP PACEART REMOTE DEVICE CHECK   Collection Time: 07/07/23  1:24 AM  Result Value Ref Range   Date Time Interrogation Session [id number]    Pulse Generator Manufacturer MERM    Pulse Gen Model [id number] Claria MRI Quad CRT-D  Pulse Gen Serial Number [id number]    Clinic Name [facility]    Implantable Pulse Generator Type Cardiac Resynch Therapy Defibulator    Implantable Pulse Generator Implant Date 79818869    Implantable Lead Manufacturer MERM    Implantable Lead Model 4598 Attain Performa S MRI SureScan    Implantable Lead Serial Number [id number]     Implantable Lead Implant Date 79818869    Implantable Lead Location Detail 1 UNKNOWN    Implantable Lead Location [id number]    Implantable Lead Connection Status [id number]    Implantable Lead Manufacturer MERM    Implantable Lead Model 5076 CapSureFix Novus MRI SureScan    Implantable Lead Serial Number [id number]    Implantable Lead Implant Date 79818869    Implantable Lead Location Detail 1 UNKNOWN    Implantable Lead Location [id number]    Implantable Lead Connection Status [id number]    Implantable Lead Manufacturer MERM    Implantable Lead Model 6935 Sprint Quattro Secure S MRI SureScan    Implantable Lead Serial Number [id number]    Implantable Lead Implant Date 79818869    Implantable Lead Location Detail 1 UNKNOWN    Implantable Lead Location [id number]    Implantable Lead Connection Status [id number]    Lead Channel Setting Sensing Sensitivity 0.3 mV   Lead Channel Setting Pacing Amplitude 2 V   Lead Channel Setting Pacing Pulse Width 0.4 ms   Lead Channel Setting Pacing Amplitude 2.5 V   Lead Channel Setting Pacing Pulse Width 0.4 ms   Lead Channel Setting Pacing Amplitude 1.75 V   Lead Channel Setting Pacing Capture Mode Adaptive Capture    Zone Setting Status Active    Zone Setting Status Inactive    Zone Setting Status Inactive    Zone Setting Status [id number]    Zone Setting Status [phone]    Lead Channel Impedance Value 342 ohm   Lead Channel Sensing Intrinsic Amplitude 2.5 mV   Lead Channel Sensing Intrinsic Amplitude 2.5 mV   Lead Channel Pacing Threshold Amplitude 0.625 V   Lead Channel Pacing Threshold Pulse Width 0.4 ms   Lead Channel Impedance Value 646 ohm   Lead Channel Impedance Value 589 ohm   Lead Channel Sensing Intrinsic Amplitude 10.125 mV   Lead Channel Sensing Intrinsic Amplitude 10.125 mV   Lead Channel Pacing Threshold Amplitude 0.375 V   Lead Channel Pacing Threshold Pulse Width 0.4 ms   HighPow Impedance 80 ohm   Lead Channel Impedance Value 627 ohm   Lead Channel  Impedance Value 627 ohm   Lead Channel Impedance Value 570 ohm   Lead Channel Impedance Value 399 ohm   Lead Channel Impedance Value 532 ohm   Lead Channel Impedance Value 494 ohm   Lead Channel Impedance Value 380 ohm   Lead Channel Impedance Value 342 ohm   Lead Channel Impedance Value 323 ohm   Lead Channel Impedance Value 304 ohm   Lead Channel Impedance Value 180 ohm   Lead Channel Impedance Value 174.595    Lead Channel Impedance Value 168.889    Lead Channel Impedance Value 160.941    Lead Channel Impedance Value 156.606    Lead Channel Pacing Threshold Amplitude 1.25 V   Lead Channel Pacing Threshold Pulse Width 0.4 ms   Battery Status OK    Battery Remaining Longevity 16 mo   Battery Voltage 2.89 V   Brady Statistic RA Percent Paced 64.07 %   Brady Statistic RV Percent Paced 98.8 %   [place] AP VP  Percent 64.11 %   Brady Statistic AS VP Percent 35.45 %   Brady Statistic AP VS Percent 0.03 %   Brady Statistic AS VS Percent 0.41 %       Pertinent labs & imaging results that were available during my care of the patient were reviewed by me and considered in my medical decision making.  Assessment & Plan:  Jobani was seen today for diabetes.  Diagnoses and all orders for this visit:  Type 2 diabetes mellitus with stage 3b chronic kidney disease, without long-term current use of insulin  (HCC) -     Bayer DCA Hb A1c Waived -     dapagliflozin  propanediol (FARXIGA ) 10 MG TABS tablet; Take 1 tablet (10 mg total) by mouth daily before breakfast.  Chronic systolic heart failure (HCC) -     dapagliflozin  propanediol (FARXIGA ) 10 MG TABS tablet; Take 1 tablet (10 mg total) by mouth daily before breakfast.  Hypertension associated with type 2 diabetes mellitus (HCC) -     Bayer DCA Hb A1c Waived  CKD stage 3 due to type 2 diabetes mellitus (HCC) -     Bayer DCA Hb A1c Waived -     dapagliflozin  propanediol (FARXIGA ) 10 MG TABS tablet; Take 1 tablet (10 mg total) by  mouth daily before breakfast.  Type 2 diabetes mellitus with other specified complication, without long-term current use of insulin  (HCC) -     Bayer DCA Hb A1c Waived -     dapagliflozin  propanediol (FARXIGA ) 10 MG TABS tablet; Take 1 tablet (10 mg total) by mouth daily before breakfast.  Hyperlipidemia associated with type 2 diabetes mellitus (HCC) -     Bayer DCA Hb A1c Waived  Presence of heart assist device (HCC) Followed by cardiology      Type 2 Diabetes Mellitus Diabetes is well-controlled with an A1c of 6.9%. Blood glucose levels range from 116 to 187 mg/dL. Farxiga  and glimepiride  are effective in managing blood sugar levels. Farxiga  is justified due to chronic kidney disease. No increased thirst or urination, but experiences constant hunger. - Continue Farxiga  and glimepiride  as prescribed. - Ensure a 90-day supply of Farxiga  with refills is sent to the pharmacy. - Monitor blood glucose levels regularly. - Follow up with lab work at the next visit.  Chronic Kidney Disease Chronic kidney disease is a significant factor in the decision to prescribe Farxiga . Kidney function has improved on Farxiga . - Continue Farxiga  to support kidney function. - Monitor kidney function regularly.  Heart Failure Heart failure is well-managed. No increased leg swelling or shortness of breath. Cardiology notes heart is getting stronger. Compliant with medications and no new symptoms. - Continue current heart failure medications. - Follow up with cardiology as scheduled.  Hyperlipidemia On rosuvastatin  for hyperlipidemia with no muscle aches or pains. Cholesterol management is stable. - Continue rosuvastatin  as prescribed.  Allergic Rhinitis Reports a runny nose, likely due to allergies. Not currently taking any allergy medications. - Recommend over-the-counter Claritin or Xyzal for allergy symptoms. - If symptoms persist, consider prescribing an alternative medication.           Continue all other maintenance medications.  Follow up plan: Return in about 3 months (around 11/03/2023) for DM, chronic follow up.   Continue healthy lifestyle choices, including diet (rich in fruits, vegetables, and lean proteins, and low in salt and simple carbohydrates) and exercise (at least 30 minutes of moderate physical activity daily).  Educational handout given for DM  The above assessment and management  plan was discussed with the patient. The patient verbalized understanding of and has agreed to the management plan. Patient is aware to call the clinic if they develop any new symptoms or if symptoms persist or worsen. Patient is aware when to return to the clinic for a follow-up visit. Patient educated on when it is appropriate to go to the emergency department.   Rosaline Bruns, FNP-C [facility] [phone]

## 2023-08-03 NOTE — Patient Instructions (Signed)

## 2023-08-04 ENCOUNTER — Ambulatory Visit: Payer: Self-pay | Admitting: Family Medicine

## 2023-08-21 ENCOUNTER — Other Ambulatory Visit: Payer: Self-pay | Admitting: Urology

## 2023-08-21 DIAGNOSIS — N138 Other obstructive and reflux uropathy: Secondary | ICD-10-CM

## 2023-08-27 ENCOUNTER — Other Ambulatory Visit: Payer: Self-pay | Admitting: Family Medicine

## 2023-08-27 DIAGNOSIS — E119 Type 2 diabetes mellitus without complications: Secondary | ICD-10-CM

## 2023-08-30 NOTE — Progress Notes (Signed)
 Remote ICD transmission.

## 2023-09-02 ENCOUNTER — Encounter: Payer: Self-pay | Admitting: Urology

## 2023-09-02 ENCOUNTER — Ambulatory Visit: Payer: Medicare Other | Admitting: Urology

## 2023-09-02 ENCOUNTER — Ambulatory Visit: Admitting: Urology

## 2023-09-02 VITALS — BP 122/72 | HR 78

## 2023-09-02 DIAGNOSIS — Z87442 Personal history of urinary calculi: Secondary | ICD-10-CM | POA: Diagnosis not present

## 2023-09-02 DIAGNOSIS — N138 Other obstructive and reflux uropathy: Secondary | ICD-10-CM

## 2023-09-02 DIAGNOSIS — N401 Enlarged prostate with lower urinary tract symptoms: Secondary | ICD-10-CM

## 2023-09-02 LAB — URINALYSIS, ROUTINE W REFLEX MICROSCOPIC
Bilirubin, UA: NEGATIVE
Ketones, UA: NEGATIVE
Leukocytes,UA: NEGATIVE
Nitrite, UA: NEGATIVE
RBC, UA: NEGATIVE
Specific Gravity, UA: 1.02 (ref 1.005–1.030)
Urobilinogen, Ur: 0.2 mg/dL (ref 0.2–1.0)
pH, UA: 6 (ref 5.0–7.5)

## 2023-09-02 LAB — MICROSCOPIC EXAMINATION
Bacteria, UA: NONE SEEN
RBC, Urine: NONE SEEN /HPF (ref 0–2)
WBC, UA: NONE SEEN /HPF (ref 0–5)

## 2023-09-02 LAB — BLADDER SCAN AMB NON-IMAGING: Scan Result: 0

## 2023-09-02 NOTE — Progress Notes (Signed)
 Name: Omar Williams DOB: 27-Oct-1935 MRN: [id number]  History of Present Illness: Omar Williams is a 88 y.o. male who presents today for follow up visit at [facility] [place].  Relevant History includes: 1. BPH with LUTS (nocturia). 2. Kidney stone(s). 3. Renal cysts.   At last visit with Dr. Watt on 09/03/2022: Doing well. The plan was to continue Tamsulosin , Finasteride  and Oxybutynin  2.5mg  TID.  Today: He denies any acute urinary symptoms or concerns at this time. Reports nocturia x3 but attributes that to his evening diuretic use and states it is not significantly bothersome. He denies recent suspected stone migration / passage.   Medications: Current Outpatient Medications  Medication Sig Dispense Refill   aspirin  81 MG chewable tablet Chew 81 mg by mouth daily.     Bee Pollen 550 MG CAPS Take 2 capsules by mouth 2 (two) times daily.     chlorhexidine  (PERIDEX ) 0.12 % solution as needed.     Cholecalciferol (VITAMIN D3) 125 MCG (5000 UT) TABS Take 1 tablet by mouth daily.      CINNAMON PO Take 2,000 mg by mouth in the morning and at bedtime.     cyanocobalamin (VITAMIN B12) 1000 MCG tablet Take 1,000 mcg by mouth 2 (two) times daily.     dapagliflozin  propanediol (FARXIGA ) 10 MG TABS tablet Take 1 tablet (10 mg total) by mouth daily before breakfast. 90 tablet 5   ezetimibe  (ZETIA ) 10 MG tablet TAKE 1 TABLET BY MOUTH DAILY 90 tablet 3   finasteride  (PROSCAR ) 5 MG tablet Take 1 tablet (5 mg total) by mouth daily. 90 tablet 1   furosemide  (LASIX ) 20 MG tablet TAKE 1 TABLET BY MOUTH ONCE DAILY AS NEEDED FOR EDEMA. (Patient taking differently: Take 20 mg by mouth daily.) 90 tablet 0   glimepiride  (AMARYL ) 4 MG tablet Take 1 tablet (4 mg total) by mouth in the morning and at bedtime. 180 tablet 3   glucose blood (ONETOUCH VERIO) test strip Test BS daily Dx E11.9 100 strip 3   lisinopril  (ZESTRIL ) 2.5 MG tablet Take 1 tablet (2.5 mg total) by mouth daily. 90 tablet 1    nitroGLYCERIN  (NITROSTAT ) 0.4 MG SL tablet Place 1 tablet (0.4 mg total) under the tongue every 5 (five) minutes x 3 doses as needed for chest pain. 25 tablet 3   Omega-3 Fatty Acids (FISH OIL) 1200 MG CAPS Take 1 capsule by mouth 2 (two) times daily.      oxybutynin  (DITROPAN ) 5 MG tablet Take 0.5 tablets (2.5 mg total) by mouth 3 (three) times daily. 135 tablet 3   pantoprazole  (PROTONIX ) 40 MG tablet TAKE 1 TABLET BY MOUTH DAILY 90 tablet 1   rosuvastatin  (CRESTOR ) 40 MG tablet TAKE 1 TABLET BY MOUTH DAILY 90 tablet 2   senna-docusate (SENOKOT-S) 8.6-50 MG tablet Take 1 tablet by mouth daily.     tamsulosin  (FLOMAX ) 0.4 MG CAPS capsule TAKE ONE CAPSULE BY MOUTH EVERY MORNING 90 capsule 3   TURMERIC PO Take 1,000 mg by mouth 2 (two) times daily.     vitamin C (ASCORBIC ACID) 500 MG tablet Take 1,000 mg by mouth 2 (two) times daily.     No current facility-administered medications for this visit.    Allergies: No Known Allergies  Past Medical History:  Diagnosis Date   Acid reflux    Blood transfusion without reported diagnosis    Cardiac arrest (HCC)    a. occuring in 12/2016. LAD disease but no targets for CABG or PCI.  Underwent ICD placement as EF 15-20%.    CHF (congestive heart failure) (HCC)    Coronary artery disease    Diabetes mellitus without complication (HCC)    Diabetic nephropathy (HCC)    Hyperlipidemia    Hypertension    Myocardial infarction [facility])    Renal disorder    Past Surgical History:  Procedure Laterality Date   BIV ICD INSERTION CRT-D N/A 01/01/2017   Procedure: BIV ICD INSERTION CRT-D;  Surgeon: Inocencio Soyla Lunger, MD;  [facility]: [facility] INVASIVE CV LAB;  Service: Cardiovascular;  Laterality: N/A;   Family History  Problem Relation Age of Onset   Hypertension Mother    Hyperlipidemia Mother    CAD Sister    Diabetes Sister    Hyperlipidemia Sister    Hypertension Sister    CAD Brother    Diabetes Brother    Hypertension Brother    Hyperlipidemia  Brother    CAD Brother    Diabetes Brother    Hypertension Brother    Hyperlipidemia Brother    Dementia Brother    Thyroid  disease Daughter    Hypertension Daughter    Diabetes Daughter    Hyperlipidemia Daughter    Fibromyalgia Daughter    Heart disease Daughter        stents   Multiple sclerosis Daughter    Thyroid  disease Daughter    Social History   Socioeconomic History   Marital status: Married    Spouse name: Omar Williams   Number of children: 3   Years of education: 12   Highest education level: Some college, no degree  Occupational History   Occupation: Health and safety inspector   retired   Occupation: Education officer, environmental  Tobacco Use   Smoking status: Former    Current packs/day: 0.00    Types: Cigarettes    Quit date: 1960    Years since quitting: 65.6    Passive exposure: Never   Smokeless tobacco: Never   Tobacco comments:    2-3 years as a teenager  Vaping Use   Vaping status: Never Used  Substance and Sexual Activity   Alcohol use: No   Drug use: No   Sexual activity: Not Currently  Other Topics Concern   Not on file  Social History Narrative   Lives with with wife Omar Williams   Married 60 years   Daughter Omar Williams stays frequently   Granddaughter Omar Williams near by   Social Drivers of [employer]: Low Risk  (06/23/2023)   Overall Financial Resource Strain (CARDIA)    Difficulty of Paying Living Expenses: Not hard at all  Food Insecurity: No Food Insecurity (06/23/2023)   Hunger Vital Sign    Worried About Running Out of Food in the Last Year: Never true    Ran Out of Food in the Last Year: Never true  Transportation Needs: No Transportation Needs (06/23/2023)   PRAPARE - Administrator, Civil Service (Medical): No    Lack of Transportation (Non-Medical): No  Physical Activity: Insufficiently Active (06/23/2023)   Exercise Vital Sign    Days of Exercise per Week: 7 days    Minutes of Exercise per Session: 20 min  Stress: No Stress Concern  Present (06/23/2023)   [employer] of Occupational Health - Occupational Stress Questionnaire    Feeling of Stress : Not at all  Social Connections: Socially Integrated (06/23/2023)   Social Connection and Isolation Panel    Frequency of Communication with Friends and Family: More than three times  a week    Frequency of Social Gatherings with Friends and Family: More than three times a week    Attends Religious Services: More than 4 times per year    Active Member of [employer] or Organizations: Yes    Attends Engineer, structural: More than 4 times per year    Marital Status: Married  Catering manager Violence: Not At Risk (06/23/2023)   Humiliation, Afraid, Rape, and Kick questionnaire    Fear of Current or Ex-Partner: No    Emotionally Abused: No    Physically Abused: No    Sexually Abused: No    Review of Systems Constitutional: Patient denies any unintentional weight loss or change in strength lntegumentary: Patient denies any rashes or pruritus Cardiovascular: Patient denies chest pain or syncope Respiratory: Patient denies shortness of breath Gastrointestinal: Patient denies nausea, vomiting, constipation, or diarrhea  Musculoskeletal: Patient denies muscle cramps or weakness Neurologic: Patient denies convulsions or seizures Allergic/Immunologic: Patient denies recent allergic reaction(s) Hematologic/Lymphatic: Patient denies bleeding tendencies Endocrine: Patient denies heat/cold intolerance  GU: As per HPI.  OBJECTIVE Vitals:   09/02/23 1304  BP: 122/72  Pulse: 78   There is no height or weight on file to calculate BMI.  Physical Examination Constitutional: No obvious distress; patient is non-toxic appearing  Cardiovascular: No visible lower extremity edema.  Respiratory: The patient does not have audible wheezing/stridor; respirations do not appear labored  Gastrointestinal: Abdomen non-distended Musculoskeletal: Normal ROM of UEs  Skin: No obvious  rashes/open sores  Neurologic: CN 2-12 grossly intact Psychiatric: Answered questions appropriately with normal affect  Hematologic/Lymphatic/Immunologic: No obvious bruises or sites of spontaneous bleeding  Urine microscopy: unremarkable PVR: 0 ml  ASSESSMENT BPH with obstruction/lower urinary tract symptoms - Plan: BLADDER SCAN AMB NON-IMAGING, Urinalysis, Routine w reflex microscopic  History of nephrolithiasis  Doing well; no acute findings. Will continue current medications for BPH and OAB. Offered imaging for stone surveillance; he declined. We agreed to plan for follow up in 1 year or sooner if needed. Patient verbalized understanding of and agreement with current plan. All questions were answered.  PLAN Advised the following: 1. Continue Flomax  (Tamsulosin ) 0.4 mg daily. 2. Continue Proscar  (Finasteride ) 5 mg daily. 3. Continue Oxybutynin  2.5mg  TID. 4. Return in about 1 year (around 09/01/2024) for BPH, with UA & PVR.  Orders Placed This Encounter  Procedures   Urinalysis, Routine w reflex microscopic   BLADDER SCAN AMB NON-IMAGING    It has been explained that the patient is to follow regularly with their PCP in addition to all other providers involved in their care and to follow instructions provided by these respective offices. Patient advised to contact urology clinic if any urologic-pertaining questions, concerns, new symptoms or problems arise in the interim period.  There are no Patient Instructions on file for this visit.  Electronically signed by:  Lauraine JAYSON Oz, FNP   09/02/23    1:27 PM

## 2023-09-06 ENCOUNTER — Ambulatory Visit: Attending: Cardiovascular Disease

## 2023-09-06 DIAGNOSIS — I5022 Chronic systolic (congestive) heart failure: Secondary | ICD-10-CM

## 2023-09-06 DIAGNOSIS — Z95810 Presence of automatic (implantable) cardiac defibrillator: Secondary | ICD-10-CM

## 2023-09-07 ENCOUNTER — Telehealth: Payer: Self-pay

## 2023-09-07 NOTE — Progress Notes (Signed)
 EPIC Encounter for ICM Monitoring  Patient Name: Omar Williams is a 88 y.o. male Date: 09/07/2023 Primary Care Physican: Severa Rock HERO, FNP Primary Cardiologist:  Alvan Electrophysiologist: Mealor Nephrologist: [facility] Bi-V Pacing: 99.4% 09/15/2022 Weight: 162 lbs 03/02/2023 Weight: 165-167 lbs 05/12/2023 Weight: 167 lbs 06/14/2023 Weight: 165-168 lbs 06/30/2023 Office Weight: 166 lbs   Since 19-Jul-2023 Time in AT/AF  <0.1 hr/day (<0.1%) (No OAC)   Attempted call to patient and unable to reach.  Left detailed message per DPR regarding transmission.  Transmission results reviewed.    Diet:  06/14/2023 He reports he and his wife eat out most of the time due his wife unable to cook.    Optivol Thoracic impedance suggesting intermittent days with possible fluid accumulation within the last month.   Prescribed:  Furosemide  20 mg take 1 tablet as needed for swelling.  Pt takes 1 tablet daily instead of PRN   Labs: 06/30/2023 Creatinine 1.32, BUN 23, Potassium 3.5, Sodium 142, GFR 53 04/28/2023 Creatinine 1.48, BUN 21, Potassium 3.8, Sodium 145, GFR 46 02/19/2023 Creatinine 1.59, BUN 32, Potassium 5.2, Sodium 144, GFR 42  01/29/2023 Creatinine 1.64, BUN 25, Potassium 4.9, Sodium 145, GFR 41  A complete set of results can be found in Results Review.   Recommendations:  Left voice mail with ICM number and encouraged to call if experiencing any fluid symptoms.   Follow-up plan: ICM clinic phone appointment on 10/13/2023.   91 day device clinic remote transmission 10/06/2023.       EP/Cardiology Office Visits:  Recall 05/29/2024 with Dr Nancey.   Recall 12/27/2023 with Dr Alvan.   Copy of ICM check sent to Dr. Nancey.   3 month ICM trend: 09/06/2023.    12-14 Month ICM trend:     [name], RN 09/07/2023 5:00 PM

## 2023-09-07 NOTE — Telephone Encounter (Signed)
 Remote ICM transmission received.  Attempted call to patient regarding ICM remote transmission and left detailed message per DPR.  Left ICM phone number and advised to return call for any fluid symptoms or questions. Next ICM remote transmission scheduled 10/13/2023.

## 2023-09-16 ENCOUNTER — Other Ambulatory Visit: Payer: Self-pay | Admitting: Family Medicine

## 2023-09-16 DIAGNOSIS — I5022 Chronic systolic (congestive) heart failure: Secondary | ICD-10-CM

## 2023-10-06 ENCOUNTER — Ambulatory Visit (INDEPENDENT_AMBULATORY_CARE_PROVIDER_SITE_OTHER): Payer: Medicare Other

## 2023-10-06 DIAGNOSIS — I5022 Chronic systolic (congestive) heart failure: Secondary | ICD-10-CM | POA: Diagnosis not present

## 2023-10-07 ENCOUNTER — Other Ambulatory Visit: Payer: Self-pay | Admitting: Family Medicine

## 2023-10-07 ENCOUNTER — Ambulatory Visit: Payer: Self-pay | Admitting: Cardiovascular Disease

## 2023-10-07 DIAGNOSIS — I5022 Chronic systolic (congestive) heart failure: Secondary | ICD-10-CM

## 2023-10-07 LAB — CUP PACEART REMOTE DEVICE CHECK
Battery Remaining Longevity: 15 mo
Battery Voltage: 2.88 V
Brady Statistic AP VP Percent: 60.73 %
Brady Statistic AP VS Percent: 0.02 %
Brady Statistic AS VP Percent: 38.84 %
Brady Statistic AS VS Percent: 0.41 %
Brady Statistic RA Percent Paced: 60.71 %
Brady Statistic RV Percent Paced: 98.56 %
Date Time Interrogation Session: 20250903012303
HighPow Impedance: 79 Ohm
Implantable Lead Connection Status: 753985
Implantable Lead Connection Status: 753985
Implantable Lead Connection Status: 753985
Implantable Lead Implant Date: 20181130
Implantable Lead Implant Date: 20181130
Implantable Lead Implant Date: 20181130
Implantable Lead Location: 753858
Implantable Lead Location: 753859
Implantable Lead Location: 753860
Implantable Lead Model: 4598
Implantable Lead Model: 5076
Implantable Lead Model: 6935
Implantable Pulse Generator Implant Date: 20181130
Lead Channel Impedance Value: 133 Ohm
Lead Channel Impedance Value: 141.867
Lead Channel Impedance Value: 145.871
Lead Channel Impedance Value: 145.871
Lead Channel Impedance Value: 156.606
Lead Channel Impedance Value: 266 Ohm
Lead Channel Impedance Value: 266 Ohm
Lead Channel Impedance Value: 304 Ohm
Lead Channel Impedance Value: 323 Ohm
Lead Channel Impedance Value: 342 Ohm
Lead Channel Impedance Value: 342 Ohm
Lead Channel Impedance Value: 437 Ohm
Lead Channel Impedance Value: 513 Ohm
Lead Channel Impedance Value: 513 Ohm
Lead Channel Impedance Value: 532 Ohm
Lead Channel Impedance Value: 532 Ohm
Lead Channel Impedance Value: 570 Ohm
Lead Channel Impedance Value: 646 Ohm
Lead Channel Pacing Threshold Amplitude: 0.5 V
Lead Channel Pacing Threshold Amplitude: 0.625 V
Lead Channel Pacing Threshold Amplitude: 1.5 V
Lead Channel Pacing Threshold Pulse Width: 0.4 ms
Lead Channel Pacing Threshold Pulse Width: 0.4 ms
Lead Channel Pacing Threshold Pulse Width: 0.4 ms
Lead Channel Sensing Intrinsic Amplitude: 10.75 mV
Lead Channel Sensing Intrinsic Amplitude: 10.75 mV
Lead Channel Sensing Intrinsic Amplitude: 2.375 mV
Lead Channel Sensing Intrinsic Amplitude: 2.375 mV
Lead Channel Setting Pacing Amplitude: 2 V
Lead Channel Setting Pacing Amplitude: 2 V
Lead Channel Setting Pacing Amplitude: 2.5 V
Lead Channel Setting Pacing Pulse Width: 0.4 ms
Lead Channel Setting Pacing Pulse Width: 0.4 ms
Lead Channel Setting Sensing Sensitivity: 0.3 mV
Zone Setting Status: 755011
Zone Setting Status: 755011

## 2023-10-07 NOTE — Telephone Encounter (Signed)
 Called back to pt, this was sent in on 09/17/23 He picked up several meds today, this was one of them.

## 2023-10-07 NOTE — Telephone Encounter (Signed)
 Copied from CRM [id number]. Topic: Clinical - Medication Refill >> Oct 07, 2023 11:40 AM Avram MATSU wrote: Medication: furosemide  (LASIX ) 20 MG tablet [[id number]]  Has the patient contacted their pharmacy? Yes (Agent: If no, request that the patient contact the pharmacy for the refill. If patient does not wish to contact the pharmacy document the reason why and proceed with request.) (Agent: If yes, when and what did the pharmacy advise?)  This is the patient's preferred pharmacy:  [facility] - [place], [place] - [address] [place] [address] Phone: [phone] Fax: [phone]   Is this the correct pharmacy for this prescription? Yes If no, delete pharmacy and type the correct one.   Has the prescription been filled recently? No  Is the patient out of the medication? Yes  Has the patient been seen for an appointment in the last year OR does the patient have an upcoming appointment? Yes  Can we respond through MyChart? Yes  Agent: Please be advised that Rx refills may take up to 3 business days. We ask that you follow-up with your pharmacy.

## 2023-10-13 ENCOUNTER — Ambulatory Visit: Attending: Cardiovascular Disease

## 2023-10-13 DIAGNOSIS — Z95810 Presence of automatic (implantable) cardiac defibrillator: Secondary | ICD-10-CM | POA: Diagnosis not present

## 2023-10-13 DIAGNOSIS — I5022 Chronic systolic (congestive) heart failure: Secondary | ICD-10-CM | POA: Diagnosis not present

## 2023-10-13 NOTE — Progress Notes (Signed)
 EPIC Encounter for ICM Monitoring  Patient Name: Omar Williams is a 88 y.o. male Date: 10/13/2023 Primary Care Physican: Severa Rock HERO, FNP Primary Cardiologist:  Alvan Electrophysiologist: Mealor Nephrologist: [facility] Bi-V Pacing: 99.3% 09/15/2022 Weight: 162 lbs 03/02/2023 Weight: 165-167 lbs 05/12/2023 Weight: 167 lbs 06/14/2023 Weight: 165-168 lbs 06/30/2023 Office Weight: 166 lbs  10/13/2023 Weight: 165 lbs   Since 06-Oct-2023 Time in AT/AF 0.0 hr/day (0.0%)(No OAC)   Spoke with patient and heart failure questions reviewed.  Transmission results reviewed.  Pt asymptomatic for fluid accumulation.   [place]   He was out of Lasix  x 2 weeks during decreased impedance.    Diet:  06/14/2023 He reports he and his wife eat out most of the time due his wife unable to cook.    Optivol Thoracic impedance suggesting possible fluid accumulation starting 8/20 and returned to baseline 9/7.   Prescribed:  Furosemide  20 mg take 1 tablet as needed for swelling.  Pt takes 1 tablet daily instead of PRN   Labs: 06/30/2023 Creatinine 1.32, BUN 23, Potassium 3.5, Sodium 142, GFR 53 04/28/2023 Creatinine 1.48, BUN 21, Potassium 3.8, Sodium 145, GFR 46 02/19/2023 Creatinine 1.59, BUN 32, Potassium 5.2, Sodium 144, GFR 42  01/29/2023 Creatinine 1.64, BUN 25, Potassium 4.9, Sodium 145, GFR 41  A complete set of results can be found in Results Review.   Recommendations:  Pt resumed taking Lasix  1 tablet daily and will call if any changes.    Follow-up plan: ICM clinic phone appointment on 11/22/2023.   91 day device clinic remote transmission 01/05/2024.       EP/Cardiology Office Visits:  Recall 05/29/2024 with Dr Nancey.   Recall 12/27/2023 with Dr Alvan.   Copy of ICM check sent to Dr. Nancey.   3 month ICM trend: 10/13/2023.    12-14 Month ICM trend:     [name], RN 10/13/2023 3:33 PM

## 2023-10-16 NOTE — Progress Notes (Signed)
Remote ICD Transmission.

## 2023-11-01 ENCOUNTER — Other Ambulatory Visit: Payer: Self-pay | Admitting: Family Medicine

## 2023-11-01 DIAGNOSIS — N401 Enlarged prostate with lower urinary tract symptoms: Secondary | ICD-10-CM

## 2023-11-04 ENCOUNTER — Ambulatory Visit (INDEPENDENT_AMBULATORY_CARE_PROVIDER_SITE_OTHER): Admitting: Family Medicine

## 2023-11-04 ENCOUNTER — Ambulatory Visit: Payer: Self-pay | Admitting: Family Medicine

## 2023-11-04 ENCOUNTER — Encounter: Payer: Self-pay | Admitting: Family Medicine

## 2023-11-04 VITALS — BP 111/68 | HR 60 | Temp 97.4°F | Ht 67.0 in | Wt 161.0 lb

## 2023-11-04 DIAGNOSIS — I25118 Atherosclerotic heart disease of native coronary artery with other forms of angina pectoris: Secondary | ICD-10-CM

## 2023-11-04 DIAGNOSIS — I152 Hypertension secondary to endocrine disorders: Secondary | ICD-10-CM

## 2023-11-04 DIAGNOSIS — E1169 Type 2 diabetes mellitus with other specified complication: Secondary | ICD-10-CM | POA: Diagnosis not present

## 2023-11-04 DIAGNOSIS — I5022 Chronic systolic (congestive) heart failure: Secondary | ICD-10-CM

## 2023-11-04 DIAGNOSIS — K219 Gastro-esophageal reflux disease without esophagitis: Secondary | ICD-10-CM

## 2023-11-04 DIAGNOSIS — Z23 Encounter for immunization: Secondary | ICD-10-CM | POA: Diagnosis not present

## 2023-11-04 DIAGNOSIS — E1122 Type 2 diabetes mellitus with diabetic chronic kidney disease: Secondary | ICD-10-CM | POA: Diagnosis not present

## 2023-11-04 DIAGNOSIS — E1159 Type 2 diabetes mellitus with other circulatory complications: Secondary | ICD-10-CM | POA: Diagnosis not present

## 2023-11-04 DIAGNOSIS — Z95811 Presence of heart assist device: Secondary | ICD-10-CM

## 2023-11-04 DIAGNOSIS — N1832 Chronic kidney disease, stage 3b: Secondary | ICD-10-CM

## 2023-11-04 DIAGNOSIS — B372 Candidiasis of skin and nail: Secondary | ICD-10-CM

## 2023-11-04 LAB — LIPID PANEL
Chol/HDL Ratio: 2.7 ratio (ref 0.0–5.0)
Cholesterol, Total: 163 mg/dL (ref 100–199)
HDL: 60 mg/dL (ref >39)
LDL Chol Calc (NIH): 87 mg/dL (ref 0–99)
Triglycerides: 85 mg/dL (ref 0–149)
VLDL Cholesterol Cal: 16 mg/dL (ref 5–40)

## 2023-11-04 LAB — CMP14+EGFR
ALT: 21 IU/L (ref 0–44)
AST: 21 IU/L (ref 0–40)
Albumin: 4.6 g/dL (ref 3.7–4.7)
Alkaline Phosphatase: 78 IU/L (ref 48–129)
BUN/Creatinine Ratio: 17 (ref 10–24)
BUN: 25 mg/dL (ref 8–27)
Bilirubin Total: 0.8 mg/dL (ref 0.0–1.2)
CO2: 22 mmol/L (ref 20–29)
Calcium: 9.7 mg/dL (ref 8.6–10.2)
Chloride: 101 mmol/L (ref 96–106)
Creatinine, Ser: 1.45 mg/dL — ABNORMAL HIGH (ref 0.76–1.27)
Globulin, Total: 2.5 g/dL (ref 1.5–4.5)
Glucose: 92 mg/dL (ref 70–99)
Potassium: 4.1 mmol/L (ref 3.5–5.2)
Sodium: 140 mmol/L (ref 134–144)
Total Protein: 7.1 g/dL (ref 6.0–8.5)
eGFR: 47 mL/min/1.73 — ABNORMAL LOW (ref >59)

## 2023-11-04 LAB — BAYER DCA HB A1C WAIVED: HB A1C (BAYER DCA - WAIVED): 6.7 % — ABNORMAL HIGH (ref 4.8–5.6)

## 2023-11-04 MED ORDER — LISINOPRIL 2.5 MG PO TABS: 2.5000 mg | ORAL_TABLET | Freq: Every day | ORAL | 1 refills | Status: AC

## 2023-11-04 MED ORDER — FUROSEMIDE 20 MG PO TABS
20.0000 mg | ORAL_TABLET | Freq: Every day | ORAL | 1 refills | Status: AC
Start: 2023-11-04 — End: ?

## 2023-11-04 MED ORDER — NYSTATIN-TRIAMCINOLONE 100000-0.1 UNIT/GM-% EX OINT: 1.0000 | TOPICAL_OINTMENT | Freq: Two times a day (BID) | CUTANEOUS | 0 refills | Status: AC

## 2023-11-04 MED ORDER — DAPAGLIFLOZIN PROPANEDIOL 10 MG PO TABS: 10.0000 mg | ORAL_TABLET | Freq: Every day | ORAL | 5 refills | Status: DC

## 2023-11-04 MED ORDER — PANTOPRAZOLE SODIUM 40 MG PO TBEC
40.0000 mg | DELAYED_RELEASE_TABLET | Freq: Every day | ORAL | 1 refills | Status: AC
Start: 2023-11-04 — End: ?

## 2023-11-04 NOTE — Patient Instructions (Addendum)

## 2023-11-04 NOTE — Progress Notes (Signed)
 Subjective:  Patient ID: Omar Williams, male    DOB: 1935-10-30, 88 y.o.   MRN: [id number]  Patient Care Team: Severa Rock HERO, FNP as PCP - General (Family Medicine) Alvan, Dorn FALCON, MD as PCP - Cardiology (Cardiology) Mealor, Eulas BRAVO, MD as PCP - Electrophysiology (Cardiology) Brinda Elsie BRAVO, OD (Optometry) Richarda Prentice LITTIE Mickey., NP as Nurse Practitioner (Cardiology) Rachele Gaynell RAMAN, MD as Consulting Physician (Nephrology) Watt Rush, MD as Attending Physician (Urology) Mavis Anes, MD as Consulting Physician (General Surgery) Billee Mliss BIRCH, [facility] as Pharmacist (Family Medicine)   Chief Complaint:  Medical Management of Chronic Issues (3 month chronic follow up ) and Rash (Groin area that has been going on a few weeks. )   HPI: Omar Williams is a 88 y.o. male presenting on 11/04/2023 for Medical Management of Chronic Issues (3 month chronic follow up ) and Rash (Groin area that has been going on a few weeks. )   Omar Williams is an 88 year old male with diabetes and heart disease who presents for management of chronic medical conditions. He also reports a rash.  He has developed a rash on both sides of his groin, which he suspects may be caused by wearing plastic diapers. The rash feels 'rough' and 'raw' and has been present for a while, causing significant discomfort. He has been using cortisol cream without relief.  He has diabetes, with recent blood sugar levels ranging from 98 to 125 mg/dL. No increased hunger, thirst, or urination. He is currently taking Farxiga  and glimepiride  for diabetes management.  He has heart disease and reports no chest pain, leg swelling, shortness of breath, or increased fatigue. He is active regularly but experiences some expected tiredness. He takes a fluid pill at night to manage fluid retention and reports no recent changes in his blood pressure medications, which include Lasix  and lisinopril . He has a defibrillator, which is checked remotely  every 30 days, and it has not fired.  He experiences occasional acid reflux, for which he takes medication as needed, particularly after eating foods like pizza. He uses Protonix  for this condition.  He reports having regular bowel movements but notes that they are sometimes watery. He occasionally uses a stool softener.          Relevant past medical, surgical, family, and social history reviewed and updated as indicated.  Allergies and medications reviewed and updated. Data reviewed: Chart in Epic.   Past Medical History:  Diagnosis Date   Acid reflux    Blood transfusion without reported diagnosis    Cardiac arrest (HCC)    a. occuring in 12/2016. LAD disease but no targets for CABG or PCI. Underwent ICD placement as EF 15-20%.    CHF (congestive heart failure) (HCC)    Coronary artery disease    Diabetes mellitus without complication (HCC)    Diabetic nephropathy (HCC)    Hyperlipidemia    Hypertension    Myocardial infarction [facility])    Renal disorder     Past Surgical History:  Procedure Laterality Date   BIV ICD INSERTION CRT-D N/A 01/01/2017   Procedure: BIV ICD INSERTION CRT-D;  Surgeon: Inocencio Soyla Lunger, MD;  Location: [facility] INVASIVE CV LAB;  Service: Cardiovascular;  Laterality: N/A;    Social History   Socioeconomic History   Marital status: Married    Spouse name: johnna   Number of children: 3   Years of education: 12   Highest education level: Some college, no degree  Occupational History  Occupation: Health and safety inspector   retired   Occupation: Education officer, environmental  Tobacco Use   Smoking status: Former    Current packs/day: 0.00    Types: Cigarettes    Quit date: 1960    Years since quitting: 65.7    Passive exposure: Never   Smokeless tobacco: Never   Tobacco comments:    2-3 years as a teenager  Vaping Use   Vaping status: Never Used  Substance and Sexual Activity   Alcohol use: No   Drug use: No   Sexual activity: Not Currently  Other Topics Concern    Not on file  Social History Narrative   Lives with with wife Dickie   Married 60 years   Daughter Dickey Amble stays frequently   Granddaughter Rhoda near by   Social Drivers of [employer]: Low Risk  (06/23/2023)   Overall Financial Resource Strain (CARDIA)    Difficulty of Paying Living Expenses: Not hard at all  Food Insecurity: No Food Insecurity (06/23/2023)   Hunger Vital Sign    Worried About Running Out of Food in the Last Year: Never true    Ran Out of Food in the Last Year: Never true  Transportation Needs: No Transportation Needs (06/23/2023)   PRAPARE - Administrator, Civil Service (Medical): No    Lack of Transportation (Non-Medical): No  Physical Activity: Insufficiently Active (06/23/2023)   Exercise Vital Sign    Days of Exercise per Week: 7 days    Minutes of Exercise per Session: 20 min  Stress: No Stress Concern Present (06/23/2023)   [employer] of Occupational Health - Occupational Stress Questionnaire    Feeling of Stress : Not at all  Social Connections: Socially Integrated (06/23/2023)   Social Connection and Isolation Panel    Frequency of Communication with Friends and Family: More than three times a week    Frequency of Social Gatherings with Friends and Family: More than three times a week    Attends Religious Services: More than 4 times per year    Active Member of [employer] or Organizations: Yes    Attends Engineer, structural: More than 4 times per year    Marital Status: Married  Catering manager Violence: Not At Risk (06/23/2023)   Humiliation, Afraid, Rape, and Kick questionnaire    Fear of Current or Ex-Partner: No    Emotionally Abused: No    Physically Abused: No    Sexually Abused: No    Outpatient Encounter Medications as of 11/04/2023  Medication Sig   aspirin  81 MG chewable tablet Chew 81 mg by mouth daily.   Bee Pollen 550 MG CAPS Take 2 capsules by mouth 2 (two) times daily.   chlorhexidine   (PERIDEX ) 0.12 % solution as needed.   Cholecalciferol (VITAMIN D3) 125 MCG (5000 UT) TABS Take 1 tablet by mouth daily.    CINNAMON PO Take 2,000 mg by mouth in the morning and at bedtime.   cyanocobalamin (VITAMIN B12) 1000 MCG tablet Take 1,000 mcg by mouth 2 (two) times daily.   ezetimibe  (ZETIA ) 10 MG tablet TAKE 1 TABLET BY MOUTH DAILY   finasteride  (PROSCAR ) 5 MG tablet TAKE 1 TABLET BY MOUTH DAILY   glimepiride  (AMARYL ) 4 MG tablet Take 1 tablet (4 mg total) by mouth in the morning and at bedtime.   glucose blood (ONETOUCH VERIO) test strip Test BS daily Dx E11.9   nitroGLYCERIN  (NITROSTAT ) 0.4 MG SL tablet Place 1 tablet (0.4 mg total)  under the tongue every 5 (five) minutes x 3 doses as needed for chest pain.   nystatin-triamcinolone ointment (MYCOLOG) Apply 1 Application topically 2 (two) times daily.   Omega-3 Fatty Acids (FISH OIL) 1200 MG CAPS Take 1 capsule by mouth 2 (two) times daily.    oxybutynin  (DITROPAN ) 5 MG tablet Take 0.5 tablets (2.5 mg total) by mouth 3 (three) times daily.   rosuvastatin  (CRESTOR ) 40 MG tablet TAKE 1 TABLET BY MOUTH DAILY   senna-docusate (SENOKOT-S) 8.6-50 MG tablet Take 1 tablet by mouth daily.   tamsulosin  (FLOMAX ) 0.4 MG CAPS capsule TAKE ONE CAPSULE BY MOUTH EVERY MORNING   TURMERIC PO Take 1,000 mg by mouth 2 (two) times daily.   vitamin C (ASCORBIC ACID) 500 MG tablet Take 1,000 mg by mouth 2 (two) times daily.   [DISCONTINUED] dapagliflozin  propanediol (FARXIGA ) 10 MG TABS tablet Take 1 tablet (10 mg total) by mouth daily before breakfast.   dapagliflozin  propanediol (FARXIGA ) 10 MG TABS tablet Take 1 tablet (10 mg total) by mouth daily before breakfast.   furosemide  (LASIX ) 20 MG tablet Take 1 tablet (20 mg total) by mouth daily.   lisinopril  (ZESTRIL ) 2.5 MG tablet Take 1 tablet (2.5 mg total) by mouth daily.   pantoprazole  (PROTONIX ) 40 MG tablet Take 1 tablet (40 mg total) by mouth daily.   [DISCONTINUED] furosemide  (LASIX ) 20 MG tablet  TAKE 1 TABLET BY MOUTH ONCE DAILY AS NEEDED FOR EDEMA.   [DISCONTINUED] lisinopril  (ZESTRIL ) 2.5 MG tablet Take 1 tablet (2.5 mg total) by mouth daily.   [DISCONTINUED] pantoprazole  (PROTONIX ) 40 MG tablet TAKE 1 TABLET BY MOUTH DAILY   No facility-administered encounter medications on file as of 11/04/2023.    No Known Allergies  Pertinent ROS per HPI, otherwise unremarkable      Objective:  BP 111/68   Pulse 60   Temp (!) 97.4 F (36.3 C)   Ht 5' 7 (1.702 m)   Wt 161 lb (73 kg)   SpO2 95%   BMI 25.22 kg/m    Wt Readings from Last 3 Encounters:  11/04/23 161 lb (73 kg)  08/03/23 165 lb 3.2 oz (74.9 kg)  06/30/23 165 lb 6.4 oz (75 kg)    Physical Exam Vitals and nursing note reviewed.  Constitutional:      General: He is not in acute distress.    Appearance: Normal appearance. He is normal weight. He is not ill-appearing, toxic-appearing or diaphoretic.  HENT:     Head: Normocephalic and atraumatic.     Nose: Nose normal.     Mouth/Throat:     Mouth: Mucous membranes are moist.  Eyes:     Conjunctiva/sclera: Conjunctivae normal.     Pupils: Pupils are equal, round, and reactive to light.  Cardiovascular:     Rate and Rhythm: Normal rate and regular rhythm.     Heart sounds: Normal heart sounds.     Comments: ICD pocket well healed Pulmonary:     Effort: Pulmonary effort is normal.     Breath sounds: Normal breath sounds.  Musculoskeletal:     Cervical back: Neck supple.     Right lower leg: No edema.     Left lower leg: No edema.  Skin:    General: Skin is warm and dry.     Capillary Refill: Capillary refill takes less than 2 seconds.     Findings: Rash (beefy red rash to groin) present.  Neurological:     General: No focal deficit present.  Mental Status: He is alert and oriented to person, place, and time.  Psychiatric:        Mood and Affect: Mood normal.        Behavior: Behavior normal.        Thought Content: Thought content normal.         Judgment: Judgment normal.      Results for orders placed or performed in visit on 10/06/23  CUP PACEART REMOTE DEVICE CHECK   Collection Time: 10/06/23  1:23 AM  Result Value Ref Range   Date Time Interrogation Session [id number]    Pulse Generator Manufacturer MERM    Pulse Gen Model [id number] Claria MRI Quad CRT-D    Pulse Gen Serial Number [id number]    [facility]    Implantable Pulse Generator Type Cardiac Resynch Therapy Defibulator    Implantable Pulse Generator Implant Date 79818869    Implantable Lead Manufacturer MERM    Implantable Lead Model 4598 Attain Performa S MRI SureScan    Implantable Lead Serial Number [id number]    Implantable Lead Implant Date 79818869    Implantable Lead Location Detail 1 UNKNOWN    Implantable Lead Location [id number]    Implantable Lead Connection Status [id number]    Implantable Lead Manufacturer MERM    Implantable Lead Model 5076 CapSureFix Novus MRI SureScan    Implantable Lead Serial Number [id number]    Implantable Lead Implant Date 79818869    Implantable Lead Location Detail 1 UNKNOWN    Implantable Lead Location [id number]    Implantable Lead Connection Status [id number]    Implantable Lead Manufacturer MERM    Implantable Lead Model 6935 Sprint Quattro Secure S MRI SureScan    Implantable Lead Serial Number [id number]    Implantable Lead Implant Date 79818869    Implantable Lead Location Detail 1 UNKNOWN    Implantable Lead Location [id number]    Implantable Lead Connection Status [id number]    Lead Channel Setting Sensing Sensitivity 0.3 mV   Lead Channel Setting Pacing Amplitude 2 V   Lead Channel Setting Pacing Pulse Width 0.4 ms   Lead Channel Setting Pacing Amplitude 2.5 V   Lead Channel Setting Pacing Pulse Width 0.4 ms   Lead Channel Setting Pacing Amplitude 2 V   Lead Channel Setting Pacing Capture Mode Adaptive Capture    Zone Setting Status Active    Zone Setting Status Inactive    Zone Setting Status Inactive     Zone Setting Status [id number]    Zone Setting Status [phone]    Lead Channel Impedance Value 342 ohm   Lead Channel Sensing Intrinsic Amplitude 2.375 mV   Lead Channel Sensing Intrinsic Amplitude 2.375 mV   Lead Channel Pacing Threshold Amplitude 0.625 V   Lead Channel Pacing Threshold Pulse Width 0.4 ms   Lead Channel Impedance Value 646 ohm   Lead Channel Impedance Value 570 ohm   Lead Channel Sensing Intrinsic Amplitude 10.75 mV   Lead Channel Sensing Intrinsic Amplitude 10.75 mV   Lead Channel Pacing Threshold Amplitude 0.5 V   Lead Channel Pacing Threshold Pulse Width 0.4 ms   HighPow Impedance 79 ohm   Lead Channel Impedance Value 532 ohm   Lead Channel Impedance Value 532 ohm   Lead Channel Impedance Value 513 ohm   Lead Channel Impedance Value 342 ohm   Lead Channel Impedance Value 513 ohm   Lead Channel Impedance Value 437 ohm   Lead Channel Impedance Value 323 ohm   Lead Channel Impedance Value  304 ohm   Lead Channel Impedance Value 266 ohm   Lead Channel Impedance Value 266 ohm   Lead Channel Impedance Value 156.606    Lead Channel Impedance Value 145.871    Lead Channel Impedance Value 145.871    Lead Channel Impedance Value 141.867    Lead Channel Impedance Value 133 ohm   Lead Channel Pacing Threshold Amplitude 1.5 V   Lead Channel Pacing Threshold Pulse Width 0.4 ms   Battery Status OK    Battery Remaining Longevity 15 mo   Battery Voltage 2.88 V   Brady Statistic RA Percent Paced 60.71 %   Brady Statistic RV Percent Paced 98.56 %   Brady Statistic AP VP Percent 60.73 %   Brady Statistic AS VP Percent 38.84 %   Brady Statistic AP VS Percent 0.02 %   Brady Statistic AS VS Percent 0.41 %       Pertinent labs & imaging results that were available during my care of the patient were reviewed by me and considered in my medical decision making.  Assessment & Plan:  Omar Williams was seen today for medical management of chronic issues and rash.  Diagnoses and all orders for  this visit:  Coronary artery disease of native artery of native heart with stable angina pectoris -     lisinopril  (ZESTRIL ) 2.5 MG tablet; Take 1 tablet (2.5 mg total) by mouth daily. -     furosemide  (LASIX ) 20 MG tablet; Take 1 tablet (20 mg total) by mouth daily.  CKD stage 3 due to type 2 diabetes mellitus (HCC) -     lisinopril  (ZESTRIL ) 2.5 MG tablet; Take 1 tablet (2.5 mg total) by mouth daily. -     dapagliflozin  propanediol (FARXIGA ) 10 MG TABS tablet; Take 1 tablet (10 mg total) by mouth daily before breakfast.  Type 2 diabetes mellitus with other specified complication, without long-term current use of insulin  (HCC) -     Bayer DCA Hb A1c Waived -     lisinopril  (ZESTRIL ) 2.5 MG tablet; Take 1 tablet (2.5 mg total) by mouth daily. -     dapagliflozin  propanediol (FARXIGA ) 10 MG TABS tablet; Take 1 tablet (10 mg total) by mouth daily before breakfast.  Hypertension associated with type 2 diabetes mellitus (HCC) -     CMP14+EGFR -     Lipid panel -     lisinopril  (ZESTRIL ) 2.5 MG tablet; Take 1 tablet (2.5 mg total) by mouth daily. -     furosemide  (LASIX ) 20 MG tablet; Take 1 tablet (20 mg total) by mouth daily.  Chronic systolic heart failure (HCC) -     lisinopril  (ZESTRIL ) 2.5 MG tablet; Take 1 tablet (2.5 mg total) by mouth daily. -     furosemide  (LASIX ) 20 MG tablet; Take 1 tablet (20 mg total) by mouth daily. -     dapagliflozin  propanediol (FARXIGA ) 10 MG TABS tablet; Take 1 tablet (10 mg total) by mouth daily before breakfast.  Presence of heart assist device (HCC) -     lisinopril  (ZESTRIL ) 2.5 MG tablet; Take 1 tablet (2.5 mg total) by mouth daily. -     furosemide  (LASIX ) 20 MG tablet; Take 1 tablet (20 mg total) by mouth daily.  Gastro-esophageal reflux disease without esophagitis -     pantoprazole  (PROTONIX ) 40 MG tablet; Take 1 tablet (40 mg total) by mouth daily.  Type 2 diabetes mellitus with stage 3b chronic kidney disease, without long-term current use  of insulin  (HCC) -  dapagliflozin  propanediol (FARXIGA ) 10 MG TABS tablet; Take 1 tablet (10 mg total) by mouth daily before breakfast.  Yeast dermatitis -     nystatin-triamcinolone ointment (MYCOLOG); Apply 1 Application topically 2 (two) times daily.  Encounter for immunization -     Flu vaccine HIGH DOSE PF(Fluzone Trivalent)     Type 2 diabetes mellitus Well-controlled with blood glucose levels ranging from 98 to 125 mg/dL. Hemoglobin A1c has improved from 6.9% to 6.7%. - Continue Farxiga  and glimepiride . - Sent in prescription for Farxiga .  Chronic systolic heart failure with defibrillator Chronic systolic heart failure is well-managed with no recent episodes of chest pain, leg swelling, shortness of breath, or increased fatigue. Defibrillator has not fired. Remote checks are performed every 30 days. - Continue current heart failure management regimen. - Ensure regular remote checks of defibrillator.  Diaper-associated intertriginous dermatitis with possible candidiasis Diaper-associated intertriginous dermatitis with possible candidiasis, presenting as a rough, raw rash with pain. Current use of over-the-counter hydrocortisone is ineffective. - Prescribed a dual combination cream containing nystatin and triamcinolone. - Advised use of Aquaphor, CeraVe healing ointment, or A and D ointment after rash clears to prevent recurrence.  Gastroesophageal reflux disease (GERD) GERD is managed with intermittent use of medication, primarily after consuming trigger foods like pizza. - Continue current GERD management with as-needed medication use.  Chronic kidney disease, unspecified stage Chronic kidney disease is monitored with recent microalbumin levels within normal limits.  Chronic diarrhea Persists with watery stools. Current management includes stool softeners as needed. - Recommended probiotics such as Activia or Commeda to improve bowel health. - Advised consultation with  pharmacist for best probiotic options.          Continue all other maintenance medications.  Follow up plan: Return in about 3 months (around 02/04/2024) for DM.   Continue healthy lifestyle choices, including diet (rich in fruits, vegetables, and lean proteins, and low in salt and simple carbohydrates) and exercise (at least 30 minutes of moderate physical activity daily).  Educational handout given for DM  The above assessment and management plan was discussed with the patient. The patient verbalized understanding of and has agreed to the management plan. Patient is aware to call the clinic if they develop any new symptoms or if symptoms persist or worsen. Patient is aware when to return to the clinic for a follow-up visit. Patient educated on when it is appropriate to go to the emergency department.   Rosaline Bruns, FNP-C [facility] [phone]

## 2023-11-05 ENCOUNTER — Telehealth: Payer: Self-pay | Admitting: Family Medicine

## 2023-11-05 NOTE — Telephone Encounter (Signed)
 Copied from CRM [id number]. Topic: Clinical - Lab/Test Results >> Nov 05, 2023  3:35 PM Montie POUR wrote: Reason for CRM:  Please call Shae at [phone] to discuss his lab results. He wants to know what is NSAIDs and what exactly he needs to avoid. Thanks   I read him this from his chart: Rock CHRISTELLA Bruns, FNP 11/05/2023 11:55 AM EDT Renal function has declined slightly but is stable. Make sure to stay hydrated and to avoid NSAIDs. Cholesterol is normal.

## 2023-11-05 NOTE — Telephone Encounter (Signed)
 Pt aware of results and reviewed what NSAIDS are and pt voiced understanding.

## 2023-11-22 ENCOUNTER — Ambulatory Visit: Attending: Cardiovascular Disease

## 2023-11-22 ENCOUNTER — Other Ambulatory Visit: Payer: Self-pay | Admitting: Cardiology

## 2023-11-22 ENCOUNTER — Telehealth: Payer: Self-pay

## 2023-11-22 DIAGNOSIS — I5022 Chronic systolic (congestive) heart failure: Secondary | ICD-10-CM

## 2023-11-22 DIAGNOSIS — Z95810 Presence of automatic (implantable) cardiac defibrillator: Secondary | ICD-10-CM | POA: Diagnosis not present

## 2023-11-22 NOTE — Progress Notes (Signed)
 EPIC Encounter for ICM Monitoring  Patient Name: Omar Williams is a 88 y.o. male Date: 11/22/2023 Primary Care Physican: Severa Rock HERO, FNP Primary Cardiologist:  Alvan Electrophysiologist: Mealor Nephrologist: [facility] Bi-V Pacing: 99.4% 09/15/2022 Weight: 162 lbs 03/02/2023 Weight: 165-167 lbs 05/12/2023 Weight: 167 lbs 06/14/2023 Weight: 165-168 lbs 06/30/2023 Office Weight: 166 lbs  10/13/2023 Weight: 165 lbs 11/22/2023 Weight: 160 lbs                Clinical Status (13-Oct-2023 to 22-Nov-2023) Time in AT/AF 0.0 hr/day (0.0%)(No OAC)   Spoke with patient and heart failure questions reviewed.  Transmission results reviewed.  Pt asymptomatic for fluid accumulation.   He will be in [place]  for Thanksgiving but will have monitor with him.    Diet:  No updates   Since 10/13/2023 ICM Remote Transmission: Optivol Thoracic impedance suggesting normal fluid levels.   Prescribed:  Furosemide  20 mg take 1 tablet by mouth daily.   Labs: 11/04/2023 Creatinine 1.45, BUN 25, Potassium 4.1, Sodium 140, GFR 47 06/30/2023 Creatinine 1.32, BUN 23, Potassium 3.5, Sodium 142, GFR 53 04/28/2023 Creatinine 1.48, BUN 21, Potassium 3.8, Sodium 145, GFR 46 02/19/2023 Creatinine 1.59, BUN 32, Potassium 5.2, Sodium 144, GFR 42  01/29/2023 Creatinine 1.64, BUN 25, Potassium 4.9, Sodium 145, GFR 41  A complete set of results can be found in Results Review.   Recommendations:  No changes and encouraged to call if experiencing any fluid symptoms.   Follow-up plan: ICM clinic phone appointment on 11/24//2025.   91 day device clinic remote transmission 01/05/2024.       EP/Cardiology Office Visits:  Recall 05/29/2024 with Dr Nancey.   Recall 12/27/2023 with Dr Alvan.   Copy of ICM check sent to Dr. Nancey.   Remote monitoring is medically necessary for Heart Failure Management.    Daily Thoracic Impedance ICM trend: 08/23/2023 through 11/22/2023.    12-14 Month Thoracic Impedance  ICM trend:     [name], RN 11/22/2023 3:15 PM

## 2023-11-23 DIAGNOSIS — E119 Type 2 diabetes mellitus without complications: Secondary | ICD-10-CM | POA: Diagnosis not present

## 2023-11-23 DIAGNOSIS — N189 Chronic kidney disease, unspecified: Secondary | ICD-10-CM | POA: Diagnosis not present

## 2023-11-23 DIAGNOSIS — D631 Anemia in chronic kidney disease: Secondary | ICD-10-CM | POA: Diagnosis not present

## 2023-11-30 NOTE — Telephone Encounter (Signed)
 Application placed in mail.

## 2023-12-02 DIAGNOSIS — E119 Type 2 diabetes mellitus without complications: Secondary | ICD-10-CM | POA: Diagnosis not present

## 2023-12-03 DIAGNOSIS — N1832 Chronic kidney disease, stage 3b: Secondary | ICD-10-CM | POA: Diagnosis not present

## 2023-12-03 DIAGNOSIS — R809 Proteinuria, unspecified: Secondary | ICD-10-CM | POA: Diagnosis not present

## 2023-12-03 DIAGNOSIS — E1122 Type 2 diabetes mellitus with diabetic chronic kidney disease: Secondary | ICD-10-CM | POA: Diagnosis not present

## 2023-12-03 DIAGNOSIS — I129 Hypertensive chronic kidney disease with stage 1 through stage 4 chronic kidney disease, or unspecified chronic kidney disease: Secondary | ICD-10-CM | POA: Diagnosis not present

## 2023-12-19 ENCOUNTER — Other Ambulatory Visit: Payer: Self-pay | Admitting: Urology

## 2023-12-19 DIAGNOSIS — N401 Enlarged prostate with lower urinary tract symptoms: Secondary | ICD-10-CM

## 2023-12-24 NOTE — Telephone Encounter (Signed)
 Faxed completed application to [employer].

## 2023-12-27 ENCOUNTER — Ambulatory Visit: Attending: Cardiovascular Disease

## 2023-12-27 DIAGNOSIS — I5022 Chronic systolic (congestive) heart failure: Secondary | ICD-10-CM

## 2023-12-27 DIAGNOSIS — Z95810 Presence of automatic (implantable) cardiac defibrillator: Secondary | ICD-10-CM

## 2023-12-29 NOTE — Progress Notes (Signed)
 EPIC Encounter for ICM Monitoring  Patient Name: Omar Williams is a 88 y.o. male Date: 12/29/2023 Primary Care Physican: Severa Rock HERO, FNP Primary Cardiologist:  Alvan Electrophysiologist: Mealor Nephrologist: [facility] Bi-V Pacing: 99.3% 09/15/2022 Weight: 162 lbs 03/02/2023 Weight: 165-167 lbs 05/12/2023 Weight: 167 lbs 06/14/2023 Weight: 165-168 lbs 06/30/2023 Office Weight: 166 lbs  10/13/2023 Weight: 165 lbs 11/22/2023 Weight: 160 lbs                Clinical Status Since 22-Nov-2023 Time in AT/AF <0.1 hr/day (<0.1%)   Spoke with patient and heart failure questions reviewed.  Transmission results reviewed.  Pt asymptomatic for fluid accumulation.   He is currently in [place]  for Thanksgiving but has monitor with him.    Diet:  No updates   Since 11/22/2023 ICM Remote Transmission: Optivol Thoracic impedance suggesting normal fluid levels with the exception of possible fluid accumulation from 12/15/2023-12/27/2023.   Prescribed:  Furosemide  20 mg take 1 tablet by mouth daily.   Labs: 11/04/2023 Creatinine 1.45, BUN 25, Potassium 4.1, Sodium 140, GFR 47 06/30/2023 Creatinine 1.32, BUN 23, Potassium 3.5, Sodium 142, GFR 53 04/28/2023 Creatinine 1.48, BUN 21, Potassium 3.8, Sodium 145, GFR 46 02/19/2023 Creatinine 1.59, BUN 32, Potassium 5.2, Sodium 144, GFR 42  01/29/2023 Creatinine 1.64, BUN 25, Potassium 4.9, Sodium 145, GFR 41  A complete set of results can be found in Results Review.   Recommendations:  No changes and encouraged to call if experiencing any fluid symptoms.   Follow-up plan: ICM clinic phone appointment on 02/07/2024.   91 day device clinic remote transmission 01/05/2024.       EP/Cardiology Office Visits:  Recall 05/29/2024 with Dr Nancey.   Recall 12/27/2023 with Dr Alvan.   Copy of ICM check sent to Dr. Nancey.   Remote monitoring is medically necessary for Heart Failure Management.    Daily Thoracic Impedance ICM trend: 09/27/2023  through 12/27/2023.    12-14 Month Thoracic Impedance ICM trend:     [name], RN 12/29/2023 9:44 AM

## 2024-01-05 ENCOUNTER — Ambulatory Visit: Payer: Medicare Other

## 2024-01-05 DIAGNOSIS — I5022 Chronic systolic (congestive) heart failure: Secondary | ICD-10-CM | POA: Diagnosis not present

## 2024-01-06 ENCOUNTER — Ambulatory Visit: Payer: Self-pay | Admitting: Cardiovascular Disease

## 2024-01-06 LAB — CUP PACEART REMOTE DEVICE CHECK
Battery Remaining Longevity: 11 mo
Battery Voltage: 2.86 V
Brady Statistic AP VP Percent: 59.69 %
Brady Statistic AP VS Percent: 0.03 %
Brady Statistic AS VP Percent: 39.77 %
Brady Statistic AS VS Percent: 0.51 %
Brady Statistic RA Percent Paced: 59.65 %
Brady Statistic RV Percent Paced: 99.14 %
Date Time Interrogation Session: 20251203001804
HighPow Impedance: 75 Ohm
Implantable Lead Connection Status: 753985
Implantable Lead Connection Status: 753985
Implantable Lead Connection Status: 753985
Implantable Lead Implant Date: 20181130
Implantable Lead Implant Date: 20181130
Implantable Lead Implant Date: 20181130
Implantable Lead Location: 753858
Implantable Lead Location: 753859
Implantable Lead Location: 753860
Implantable Lead Model: 4598
Implantable Lead Model: 5076
Implantable Lead Model: 6935
Implantable Pulse Generator Implant Date: 20181130
Lead Channel Impedance Value: 145.871
Lead Channel Impedance Value: 145.871
Lead Channel Impedance Value: 156.471
Lead Channel Impedance Value: 174.595
Lead Channel Impedance Value: 174.595
Lead Channel Impedance Value: 266 Ohm
Lead Channel Impedance Value: 323 Ohm
Lead Channel Impedance Value: 323 Ohm
Lead Channel Impedance Value: 323 Ohm
Lead Channel Impedance Value: 380 Ohm
Lead Channel Impedance Value: 380 Ohm
Lead Channel Impedance Value: 456 Ohm
Lead Channel Impedance Value: 494 Ohm
Lead Channel Impedance Value: 532 Ohm
Lead Channel Impedance Value: 570 Ohm
Lead Channel Impedance Value: 627 Ohm
Lead Channel Impedance Value: 627 Ohm
Lead Channel Impedance Value: 646 Ohm
Lead Channel Pacing Threshold Amplitude: 0.375 V
Lead Channel Pacing Threshold Amplitude: 0.625 V
Lead Channel Pacing Threshold Amplitude: 1.25 V
Lead Channel Pacing Threshold Pulse Width: 0.4 ms
Lead Channel Pacing Threshold Pulse Width: 0.4 ms
Lead Channel Pacing Threshold Pulse Width: 0.4 ms
Lead Channel Sensing Intrinsic Amplitude: 2.375 mV
Lead Channel Sensing Intrinsic Amplitude: 2.375 mV
Lead Channel Sensing Intrinsic Amplitude: 8.625 mV
Lead Channel Sensing Intrinsic Amplitude: 8.625 mV
Lead Channel Setting Pacing Amplitude: 1.75 V
Lead Channel Setting Pacing Amplitude: 2 V
Lead Channel Setting Pacing Amplitude: 2.5 V
Lead Channel Setting Pacing Pulse Width: 0.4 ms
Lead Channel Setting Pacing Pulse Width: 0.4 ms
Lead Channel Setting Sensing Sensitivity: 0.3 mV
Zone Setting Status: 755011
Zone Setting Status: 755011

## 2024-01-11 NOTE — Progress Notes (Signed)
 Remote ICD Transmission

## 2024-01-11 NOTE — Telephone Encounter (Signed)
 Per automated system, renewal pending. Refaxed application.

## 2024-01-13 NOTE — Telephone Encounter (Signed)
 Refaxed app with corrected DOB.   Re-indexed app as well.

## 2024-01-20 ENCOUNTER — Other Ambulatory Visit: Payer: Self-pay | Admitting: Cardiology

## 2024-01-20 ENCOUNTER — Other Ambulatory Visit: Payer: Self-pay | Admitting: Family Medicine

## 2024-01-20 DIAGNOSIS — E1169 Type 2 diabetes mellitus with other specified complication: Secondary | ICD-10-CM

## 2024-01-31 ENCOUNTER — Other Ambulatory Visit: Payer: Self-pay | Admitting: Family Medicine

## 2024-01-31 DIAGNOSIS — N401 Enlarged prostate with lower urinary tract symptoms: Secondary | ICD-10-CM

## 2024-02-04 ENCOUNTER — Ambulatory Visit: Payer: Self-pay | Admitting: Family Medicine

## 2024-02-04 ENCOUNTER — Encounter: Payer: Self-pay | Admitting: Family Medicine

## 2024-02-04 ENCOUNTER — Ambulatory Visit (INDEPENDENT_AMBULATORY_CARE_PROVIDER_SITE_OTHER): Payer: Self-pay | Admitting: Family Medicine

## 2024-02-04 VITALS — BP 105/63 | HR 80 | Temp 97.2°F | Ht 67.0 in | Wt 161.6 lb

## 2024-02-04 DIAGNOSIS — Z95811 Presence of heart assist device: Secondary | ICD-10-CM

## 2024-02-04 DIAGNOSIS — Z7984 Long term (current) use of oral hypoglycemic drugs: Secondary | ICD-10-CM

## 2024-02-04 DIAGNOSIS — I5022 Chronic systolic (congestive) heart failure: Secondary | ICD-10-CM

## 2024-02-04 DIAGNOSIS — E119 Type 2 diabetes mellitus without complications: Secondary | ICD-10-CM

## 2024-02-04 DIAGNOSIS — E1159 Type 2 diabetes mellitus with other circulatory complications: Secondary | ICD-10-CM | POA: Diagnosis not present

## 2024-02-04 DIAGNOSIS — I152 Hypertension secondary to endocrine disorders: Secondary | ICD-10-CM | POA: Diagnosis not present

## 2024-02-04 DIAGNOSIS — E1122 Type 2 diabetes mellitus with diabetic chronic kidney disease: Secondary | ICD-10-CM | POA: Diagnosis not present

## 2024-02-04 DIAGNOSIS — I255 Ischemic cardiomyopathy: Secondary | ICD-10-CM | POA: Diagnosis not present

## 2024-02-04 DIAGNOSIS — E785 Hyperlipidemia, unspecified: Secondary | ICD-10-CM

## 2024-02-04 DIAGNOSIS — I25118 Atherosclerotic heart disease of native coronary artery with other forms of angina pectoris: Secondary | ICD-10-CM | POA: Diagnosis not present

## 2024-02-04 DIAGNOSIS — E1169 Type 2 diabetes mellitus with other specified complication: Secondary | ICD-10-CM

## 2024-02-04 DIAGNOSIS — N183 Chronic kidney disease, stage 3 unspecified: Secondary | ICD-10-CM | POA: Diagnosis not present

## 2024-02-04 LAB — CMP14+EGFR
ALT: 25 IU/L (ref 0–44)
AST: 24 IU/L (ref 0–40)
Albumin: 4.4 g/dL (ref 3.7–4.7)
Alkaline Phosphatase: 77 IU/L (ref 48–129)
BUN/Creatinine Ratio: 20 (ref 10–24)
BUN: 32 mg/dL — ABNORMAL HIGH (ref 8–27)
Bilirubin Total: 0.8 mg/dL (ref 0.0–1.2)
CO2: 23 mmol/L (ref 20–29)
Calcium: 9.7 mg/dL (ref 8.6–10.2)
Chloride: 102 mmol/L (ref 96–106)
Creatinine, Ser: 1.6 mg/dL — ABNORMAL HIGH (ref 0.76–1.27)
Globulin, Total: 2.8 g/dL (ref 1.5–4.5)
Glucose: 95 mg/dL (ref 70–99)
Potassium: 4.9 mmol/L (ref 3.5–5.2)
Sodium: 139 mmol/L (ref 134–144)
Total Protein: 7.2 g/dL (ref 6.0–8.5)
eGFR: 42 mL/min/1.73 — ABNORMAL LOW

## 2024-02-04 LAB — BAYER DCA HB A1C WAIVED: HB A1C (BAYER DCA - WAIVED): 6.7 % — ABNORMAL HIGH (ref 4.8–5.6)

## 2024-02-04 MED ORDER — DAPAGLIFLOZIN PROPANEDIOL 10 MG PO TABS: 10.0000 mg | ORAL_TABLET | Freq: Every day | ORAL | 5 refills | Status: AC

## 2024-02-04 NOTE — Patient Instructions (Addendum)

## 2024-02-04 NOTE — Progress Notes (Signed)
 "    Subjective:  Patient ID: Omar Williams, male    DOB: 1935-10-03, 89 y.o.   MRN: [id number]  Patient Care Team: Severa Rock HERO, FNP as PCP - General (Family Medicine) Alvan, Dorn FALCON, MD as PCP - Cardiology (Cardiology) Mealor, Eulas BRAVO, MD as PCP - Electrophysiology (Cardiology) Brinda Elsie BRAVO, OD (Optometry) Richarda Prentice LITTIE Mickey., NP as Nurse Practitioner (Cardiology) Rachele Gaynell RAMAN, MD as Consulting Physician (Nephrology) Watt Rush, MD as Attending Physician (Urology) Mavis Anes, MD as Consulting Physician (General Surgery) Billee Mliss BIRCH, RPH-CPP as Pharmacist (Family Medicine)   Chief Complaint:  Diabetes (3 month follow up )   HPI: Omar Williams is a 89 y.o. male presenting on 02/04/2024 for Diabetes (3 month follow up )   Omar Williams is an 89 year old male with diabetes and heart disease who presents for routine follow-up.  His blood sugar levels have recently been as high as 133 mg/dL, and are typically around 98 mg/dL. His A1c is currently 6.7; his last measurement was also 6.7. He is on Farxiga  and glimepiride  for diabetes management and has no issues with these medications. He manages occasional hypoglycemic episodes with chocolate milk, which do not cause dizziness or other symptoms.  Regarding his heart disease, he experiences no chest pain, leg swelling, or increased dyspnea. His ICD is monitored by his assistant, with the last check occurring around Christmas. He is on lisinopril  for blood pressure management and reports no side effects.  In terms of social history, he traveled to [place]  for Thanksgiving and plans to possibly return in April. His wife recently fell into a Christmas tree, resulting in soreness but no emergency room visit. She requires assistance with mobility, often holding onto him or a walker for support.  No chest pain, leg swelling, or increased dyspnea. No urinary problems, though bowel movements are described as 'a little watery like it's  been all the time.'          Relevant past medical, surgical, family, and social history reviewed and updated as indicated.  Allergies and medications reviewed and updated. Data reviewed: Chart in Epic.   Past Medical History:  Diagnosis Date   Acid reflux    Blood transfusion without reported diagnosis    Cardiac arrest (HCC)    a. occuring in 12/2016. LAD disease but no targets for CABG or PCI. Underwent ICD placement as EF 15-20%.    CHF (congestive heart failure) (HCC)    Coronary artery disease    Diabetes mellitus without complication (HCC)    Diabetic nephropathy (HCC)    Hyperlipidemia    Hypertension    Myocardial infarction [facility])    Renal disorder     Past Surgical History:  Procedure Laterality Date   BIV ICD INSERTION CRT-D N/A 01/01/2017   Procedure: BIV ICD INSERTION CRT-D;  Surgeon: Inocencio Soyla Lunger, MD;  Location: [facility] INVASIVE CV LAB;  Service: Cardiovascular;  Laterality: N/A;    Social History   Socioeconomic History   Marital status: Married    Spouse name: johnna   Number of children: 3   Years of education: 12   Highest education level: Some college, no degree  Occupational History   Occupation: health and safety inspector   retired   Occupation: education officer, environmental  Tobacco Use   Smoking status: Former    Current packs/day: 0.00    Types: Cigarettes    Quit date: 1960    Years since quitting: 66.0    Passive exposure: Never   Smokeless tobacco:  Never   Tobacco comments:    2-3 years as a teenager  Vaping Use   Vaping status: Never Used  Substance and Sexual Activity   Alcohol use: No   Drug use: No   Sexual activity: Not Currently  Other Topics Concern   Not on file  Social History Narrative   Lives with with wife Dickie   Married 60 years   Daughter Dickey Amble stays frequently   Granddaughter Rhoda near by   Social Drivers of Health   Tobacco Use: Medium Risk (02/04/2024)   Patient History    Smoking Tobacco Use: Former    Smokeless Tobacco Use:  Never    Passive Exposure: Never  Physicist, Medical Strain: Low Risk (06/23/2023)   Overall Financial Resource Strain (CARDIA)    Difficulty of Paying Living Expenses: Not hard at all  Food Insecurity: No Food Insecurity (06/23/2023)   Hunger Vital Sign    Worried About Running Out of Food in the Last Year: Never true    Ran Out of Food in the Last Year: Never true  Transportation Needs: No Transportation Needs (06/23/2023)   PRAPARE - Administrator, Civil Service (Medical): No    Lack of Transportation (Non-Medical): No  Physical Activity: Insufficiently Active (06/23/2023)   Exercise Vital Sign    Days of Exercise per Week: 7 days    Minutes of Exercise per Session: 20 min  Stress: No Stress Concern Present (06/23/2023)   [employer] of Occupational Health - Occupational Stress Questionnaire    Feeling of Stress : Not at all  Social Connections: Socially Integrated (06/23/2023)   Social Connection and Isolation Panel    Frequency of Communication with Friends and Family: More than three times a week    Frequency of Social Gatherings with Friends and Family: More than three times a week    Attends Religious Services: More than 4 times per year    Active Member of [employer] or Organizations: Yes    Attends Banker Meetings: More than 4 times per year    Marital Status: Married  Catering Manager Violence: Not At Risk (06/23/2023)   Humiliation, Afraid, Rape, and Kick questionnaire    Fear of Current or Ex-Partner: No    Emotionally Abused: No    Physically Abused: No    Sexually Abused: No  Depression (PHQ2-9): Low Risk (02/04/2024)   Depression (PHQ2-9)    PHQ-2 Score: 0  Alcohol Screen: Low Risk (06/23/2023)   Alcohol Screen    Last Alcohol Screening Score (AUDIT): 0  Housing: Unknown (06/23/2023)   Housing Stability Vital Sign    Unable to Pay for Housing in the Last Year: No    Number of Times Moved in the Last Year: Not on file    Homeless in the Last  Year: No  Utilities: Not At Risk (06/23/2023)   AHC Utilities    Threatened with loss of utilities: No  Health Literacy: Adequate Health Literacy (06/23/2023)   B1300 Health Literacy    Frequency of need for help with medical instructions: Never    Outpatient Encounter Medications as of 02/04/2024  Medication Sig   aspirin  81 MG chewable tablet Chew 81 mg by mouth daily.   Bee Pollen 550 MG CAPS Take 2 capsules by mouth 2 (two) times daily.   chlorhexidine  (PERIDEX ) 0.12 % solution as needed.   Cholecalciferol (VITAMIN D3) 125 MCG (5000 UT) TABS Take 1 tablet by mouth daily.    CINNAMON PO Take  2,000 mg by mouth in the morning and at bedtime.   cyanocobalamin (VITAMIN B12) 1000 MCG tablet Take 1,000 mcg by mouth 2 (two) times daily.   ezetimibe  (ZETIA ) 10 MG tablet TAKE 1 TABLET BY MOUTH DAILY   finasteride  (PROSCAR ) 5 MG tablet TAKE 1 TABLET BY MOUTH DAILY   furosemide  (LASIX ) 20 MG tablet Take 1 tablet (20 mg total) by mouth daily.   glimepiride  (AMARYL ) 4 MG tablet TAKE 1 TABLET BY MOUTH IN THE MORNING AND AT BEDTIME   glucose blood (ONETOUCH VERIO) test strip Test BS daily Dx E11.9   lisinopril  (ZESTRIL ) 2.5 MG tablet Take 1 tablet (2.5 mg total) by mouth daily.   nitroGLYCERIN  (NITROSTAT ) 0.4 MG SL tablet Place 1 tablet (0.4 mg total) under the tongue every 5 (five) minutes x 3 doses as needed for chest pain.   nystatin -triamcinolone  ointment (MYCOLOG) Apply 1 Application topically 2 (two) times daily.   Omega-3 Fatty Acids (FISH OIL) 1200 MG CAPS Take 1 capsule by mouth 2 (two) times daily.    oxybutynin  (DITROPAN ) 5 MG tablet Take 0.5 tablets (2.5 mg total) by mouth 3 (three) times daily.   pantoprazole  (PROTONIX ) 40 MG tablet Take 1 tablet (40 mg total) by mouth daily.   rosuvastatin  (CRESTOR ) 40 MG tablet TAKE 1 TABLET BY MOUTH DAILY   senna-docusate (SENOKOT-S) 8.6-50 MG tablet Take 1 tablet by mouth daily.   tamsulosin  (FLOMAX ) 0.4 MG CAPS capsule TAKE ONE CAPSULE BY MOUTH EVERY  MORNING   TURMERIC PO Take 1,000 mg by mouth 2 (two) times daily.   vitamin C (ASCORBIC ACID) 500 MG tablet Take 1,000 mg by mouth 2 (two) times daily.   [DISCONTINUED] dapagliflozin  propanediol (FARXIGA ) 10 MG TABS tablet Take 1 tablet (10 mg total) by mouth daily before breakfast.   dapagliflozin  propanediol (FARXIGA ) 10 MG TABS tablet Take 1 tablet (10 mg total) by mouth daily before breakfast.   No facility-administered encounter medications on file as of 02/04/2024.    Allergies[1]  Pertinent ROS per HPI, otherwise unremarkable      Objective:  BP 105/63   Pulse 80   Temp (!) 97.2 F (36.2 C)   Ht 5' 7 (1.702 m)   Wt 161 lb 9.6 oz (73.3 kg)   SpO2 94%   BMI 25.31 kg/m    Wt Readings from Last 3 Encounters:  02/04/24 161 lb 9.6 oz (73.3 kg)  11/04/23 161 lb (73 kg)  08/03/23 165 lb 3.2 oz (74.9 kg)    Physical Exam Vitals and nursing note reviewed.  Constitutional:      Appearance: Normal appearance.  HENT:     Head: Normocephalic and atraumatic.     Nose: Nose normal.     Mouth/Throat:     Mouth: Mucous membranes are moist.  Eyes:     Conjunctiva/sclera: Conjunctivae normal.     Pupils: Pupils are equal, round, and reactive to light.  Cardiovascular:     Rate and Rhythm: Normal rate and regular rhythm.     Heart sounds: Normal heart sounds.  Pulmonary:     Effort: Pulmonary effort is normal.     Breath sounds: Normal breath sounds.  Musculoskeletal:     Cervical back: Neck supple.     Right lower leg: No edema.     Left lower leg: No edema.  Skin:    General: Skin is warm and dry.     Capillary Refill: Capillary refill takes less than 2 seconds.  Neurological:  General: No focal deficit present.     Mental Status: He is alert and oriented to person, place, and time.  Psychiatric:        Mood and Affect: Mood normal.        Behavior: Behavior normal.        Thought Content: Thought content normal.        Judgment: Judgment normal.       Results for orders placed or performed in visit on 01/05/24  CUP PACEART REMOTE DEVICE CHECK   Collection Time: 01/05/24 12:18 AM  Result Value Ref Range   Date Time Interrogation Session [id number]    Pulse Generator Manufacturer MERM    Pulse Gen Model [id number] Claria MRI Quad CRT-D    Pulse Gen Serial Number [id number]    Clinic Name [facility]    Implantable Pulse Generator Type Cardiac Resynch Therapy Defibulator    Implantable Pulse Generator Implant Date 79818869    Implantable Lead Manufacturer MERM    Implantable Lead Model 4598 Attain Performa S MRI SureScan    Implantable Lead Serial Number [id number]    Implantable Lead Implant Date 79818869    Implantable Lead Location Detail 1 UNKNOWN    Implantable Lead Location [id number]    Implantable Lead Connection Status [id number]    Implantable Lead Manufacturer MERM    Implantable Lead Model 5076 CapSureFix Novus MRI SureScan    Implantable Lead Serial Number [id number]    Implantable Lead Implant Date 79818869    Implantable Lead Location Detail 1 UNKNOWN    Implantable Lead Location [id number]    Implantable Lead Connection Status [id number]    Implantable Lead Manufacturer MERM    Implantable Lead Model 6935 Sprint Quattro Secure S MRI SureScan    Implantable Lead Serial Number [id number]    Implantable Lead Implant Date 79818869    Implantable Lead Location Detail 1 UNKNOWN    Implantable Lead Location [id number]    Implantable Lead Connection Status [id number]    Lead Channel Setting Sensing Sensitivity 0.3 mV   Lead Channel Setting Pacing Amplitude 2 V   Lead Channel Setting Pacing Pulse Width 0.4 ms   Lead Channel Setting Pacing Amplitude 2.5 V   Lead Channel Setting Pacing Pulse Width 0.4 ms   Lead Channel Setting Pacing Amplitude 1.75 V   Lead Channel Setting Pacing Capture Mode Adaptive Capture    Zone Setting Status Active    Zone Setting Status Inactive    Zone Setting Status Inactive    Zone Setting Status [id number]     Zone Setting Status [id number]    Lead Channel Impedance Value 323 ohm   Lead Channel Sensing Intrinsic Amplitude 2.375 mV   Lead Channel Sensing Intrinsic Amplitude 2.375 mV   Lead Channel Pacing Threshold Amplitude 0.625 V   Lead Channel Pacing Threshold Pulse Width 0.4 ms   Lead Channel Impedance Value 627 ohm   Lead Channel Impedance Value 570 ohm   Lead Channel Sensing Intrinsic Amplitude 8.625 mV   Lead Channel Sensing Intrinsic Amplitude 8.625 mV   Lead Channel Pacing Threshold Amplitude 0.375 V   Lead Channel Pacing Threshold Pulse Width 0.4 ms   HighPow Impedance 75 ohm   Lead Channel Impedance Value 646 ohm   Lead Channel Impedance Value 627 ohm   Lead Channel Impedance Value 532 ohm   Lead Channel Impedance Value 380 ohm   Lead Channel Impedance Value 494 ohm   Lead Channel Impedance Value 456 ohm   Lead Channel Impedance Value  380 ohm   Lead Channel Impedance Value 323 ohm   Lead Channel Impedance Value 323 ohm   Lead Channel Impedance Value 266 ohm   Lead Channel Impedance Value 174.595    Lead Channel Impedance Value 174.595    Lead Channel Impedance Value 156.471    Lead Channel Impedance Value 145.871    Lead Channel Impedance Value 145.871    Lead Channel Pacing Threshold Amplitude 1.25 V   Lead Channel Pacing Threshold Pulse Width 0.4 ms   Battery Status OK    Battery Remaining Longevity 11 mo   Battery Voltage 2.86 V   Brady Statistic RA Percent Paced 59.65 %   Brady Statistic RV Percent Paced 99.14 %   Brady Statistic AP VP Percent 59.69 %   Brady Statistic AS VP Percent 39.77 %   Brady Statistic AP VS Percent 0.03 %   Brady Statistic AS VS Percent 0.51 %       Pertinent labs & imaging results that were available during my care of the patient were reviewed by me and considered in my medical decision making.  Assessment & Plan:  Omar Williams was seen today for diabetes.  Diagnoses and all orders for this visit:  Type 2 diabetes mellitus with other  specified complication, without long-term current use of insulin  (HCC) -     Bayer DCA Hb A1c Waived -     CMP14+EGFR -     dapagliflozin  propanediol (FARXIGA ) 10 MG TABS tablet; Take 1 tablet (10 mg total) by mouth daily before breakfast.  Diabetes mellitus treated with oral medication (HCC) -     Bayer DCA Hb A1c Waived -     CMP14+EGFR -     dapagliflozin  propanediol (FARXIGA ) 10 MG TABS tablet; Take 1 tablet (10 mg total) by mouth daily before breakfast.  Hypertension associated with type 2 diabetes mellitus (HCC) -     Bayer DCA Hb A1c Waived -     CMP14+EGFR  CKD stage 3 due to type 2 diabetes mellitus (HCC) -     Bayer DCA Hb A1c Waived -     CMP14+EGFR -     dapagliflozin  propanediol (FARXIGA ) 10 MG TABS tablet; Take 1 tablet (10 mg total) by mouth daily before breakfast.  Chronic systolic heart failure (HCC) -     Bayer DCA Hb A1c Waived -     CMP14+EGFR -     dapagliflozin  propanediol (FARXIGA ) 10 MG TABS tablet; Take 1 tablet (10 mg total) by mouth daily before breakfast.  Hyperlipidemia associated with type 2 diabetes mellitus (HCC) -     Bayer DCA Hb A1c Waived -     CMP14+EGFR  Coronary artery disease of native artery of native heart with stable angina pectoris -     Bayer DCA Hb A1c Waived -     CMP14+EGFR  Ischemic cardiomyopathy -     Bayer DCA Hb A1c Waived -     CMP14+EGFR  Presence of heart assist device (HCC) -     Bayer DCA Hb A1c Waived -     CMP14+EGFR       Type 2 diabetes mellitus Well-controlled with an A1c of 6.7%. Blood glucose levels are generally stable, with occasional readings up to 133 mg/dL. No issues reported with current diabetic medications. - Continue current diabetic medications including Farxiga  and glimepiride . - Sent Farxiga  prescription to [facility].  Atherosclerotic heart disease of native coronary artery with stable angina pectoris No current symptoms of chest pain, leg swelling, or  increased shortness of breath. Regular  follow-up with cardiology is due soon. - Ensure follow-up with cardiology as scheduled.  Presence of heart assist device Heart assist device is monitored regularly. Next check is scheduled for January 4th.          Continue all other maintenance medications.  Follow up plan: Return in about 3 months (around 05/04/2024), or if symptoms worsen or fail to improve, for HTN, DM.   Continue healthy lifestyle choices, including diet (rich in fruits, vegetables, and lean proteins, and low in salt and simple carbohydrates) and exercise (at least 30 minutes of moderate physical activity daily).  Educational handout given for DM  The above assessment and management plan was discussed with the patient. The patient verbalized understanding of and has agreed to the management plan. Patient is aware to call the clinic if they develop any new symptoms or if symptoms persist or worsen. Patient is aware when to return to the clinic for a follow-up visit. Patient educated on when it is appropriate to go to the emergency department.   Rosaline Bruns, FNP-C [facility] [phone]     [1] No Known Allergies  "

## 2024-02-07 ENCOUNTER — Ambulatory Visit

## 2024-02-07 DIAGNOSIS — I5022 Chronic systolic (congestive) heart failure: Secondary | ICD-10-CM

## 2024-02-07 DIAGNOSIS — Z95810 Presence of automatic (implantable) cardiac defibrillator: Secondary | ICD-10-CM

## 2024-02-07 NOTE — Telephone Encounter (Signed)
 Copied from CRM [id number]. Topic: Clinical - Lab/Test Results >> Feb 07, 2024  9:54 AM Marda MATSU wrote: Patient called for lab results:  Read as follows  Rakes, Rock HERO, FNP to [facility]     02/06/24  8:51 PM Result Note Renal function declined. Make sure avoiding NSAIDS and staying hydrated. If still elevated at next visit, will place referral to nephrology.     Patient understood and did not request a call back.

## 2024-02-07 NOTE — Progress Notes (Signed)
 EPIC Encounter for ICM Monitoring  Patient Name: Omar Williams is a 89 y.o. male Date: 02/07/2024 Primary Care Physican: Severa Rock HERO, FNP Primary Cardiologist:  Alvan Electrophysiologist: Mealor Nephrologist: [facility] Bi-V Pacing: 99.3% 09/15/2022 Weight: 162 lbs 03/02/2023 Weight: 165-167 lbs 05/12/2023 Weight: 167 lbs 06/14/2023 Weight: 165-168 lbs 06/30/2023 Office Weight: 166 lbs  10/13/2023 Weight: 165 lbs 11/22/2023 Weight: 160 lbs 02/04/2024 Office Weight: 161.9 lbs                Clinical Status Since 05-Jan-2024 Time in AT/AF <0.1 hr/day (<0.1%)   Spoke with patient and heart failure questions reviewed.  Transmission results reviewed.  Pt asymptomatic for fluid accumulation.  He reports he is feeling well.   Diet:  No updates   Since 12/27/2023 ICM Remote Transmission: [employer] Thoracic impedance suggesting normal fluid levels.   Prescribed:  Furosemide  20 mg take 1 tablet by mouth daily.   Labs: 11/04/2023 Creatinine 1.45, BUN 25, Potassium 4.1, Sodium 140, GFR 47 06/30/2023 Creatinine 1.32, BUN 23, Potassium 3.5, Sodium 142, GFR 53 04/28/2023 Creatinine 1.48, BUN 21, Potassium 3.8, Sodium 145, GFR 46 02/19/2023 Creatinine 1.59, BUN 32, Potassium 5.2, Sodium 144, GFR 42  01/29/2023 Creatinine 1.64, BUN 25, Potassium 4.9, Sodium 145, GFR 41  A complete set of results can be found in Results Review.   Recommendations:  No changes and encouraged to call if experiencing any fluid symptoms.   Follow-up plan: ICM clinic phone appointment on 03/13/2024.   91 day device clinic remote transmission 04/05/2024.       EP/Cardiology Office Visits:  Recall 05/29/2024 with Dr Nancey.   Recall 12/27/2023 with Dr Alvan.   Copy of ICM check sent to Dr. Nancey.   Remote monitoring is medically necessary for Heart Failure Management.    Daily Thoracic Impedance ICM trend: 11/08/2023 through 02/07/2024.    12-14 Month Thoracic Impedance ICM trend:     [name], RN 02/07/2024 1:19 PM

## 2024-02-08 NOTE — Telephone Encounter (Signed)
 PAP: Patient assistance re-enrollment application for Farxiga  has been approved by PAP Companies: [place] until 02/01/25.   Medication should be delivered to: Home.   For further shipping updates, please contact [employer] ([employer]) at [phone].   Patient ID is: [id number]

## 2024-03-02 ENCOUNTER — Telehealth: Payer: Self-pay

## 2024-03-02 NOTE — Telephone Encounter (Signed)
 Returned call to patient and explained remote monitoring will be changed to every 91 days at this time.  Advised to call the office for any further needs or changes in condition.

## 2024-03-02 NOTE — Progress Notes (Signed)
 31 day ICM Remote transmission canceled due to [facility] clinic is on hold until further notice.  91 day remote monitoring will continue per protocol.

## 2024-03-13 ENCOUNTER — Ambulatory Visit

## 2024-04-05 ENCOUNTER — Ambulatory Visit

## 2024-05-04 ENCOUNTER — Ambulatory Visit: Admitting: Family Medicine

## 2024-07-05 ENCOUNTER — Ambulatory Visit

## 2024-09-01 ENCOUNTER — Ambulatory Visit: Admitting: Urology

## 2024-10-04 ENCOUNTER — Ambulatory Visit

## 2025-01-03 ENCOUNTER — Ambulatory Visit

## 2025-04-04 ENCOUNTER — Ambulatory Visit
